# Patient Record
Sex: Male | Born: 1950 | Race: Asian | Hispanic: No | Marital: Married | Smoking: Former smoker
Health system: Southern US, Community
[De-identification: ages and names within clinical notes are randomized; demographics above are authoritative.]

## PROBLEM LIST (undated history)

## (undated) DIAGNOSIS — R7989 Other specified abnormal findings of blood chemistry: Secondary | ICD-10-CM

## (undated) DIAGNOSIS — R202 Paresthesia of skin: Secondary | ICD-10-CM

## (undated) DIAGNOSIS — E162 Hypoglycemia, unspecified: Secondary | ICD-10-CM

## (undated) DIAGNOSIS — R569 Unspecified convulsions: Secondary | ICD-10-CM

## (undated) DIAGNOSIS — I10 Essential (primary) hypertension: Secondary | ICD-10-CM

## (undated) DIAGNOSIS — M65949 Unspecified synovitis and tenosynovitis, unspecified hand: Secondary | ICD-10-CM

## (undated) DIAGNOSIS — E559 Vitamin D deficiency, unspecified: Secondary | ICD-10-CM

## (undated) DIAGNOSIS — M659 Synovitis and tenosynovitis, unspecified: Secondary | ICD-10-CM

## (undated) DIAGNOSIS — K5909 Other constipation: Secondary | ICD-10-CM

## (undated) DIAGNOSIS — H269 Unspecified cataract: Secondary | ICD-10-CM

## (undated) DIAGNOSIS — R413 Other amnesia: Secondary | ICD-10-CM

## (undated) DIAGNOSIS — E785 Hyperlipidemia, unspecified: Secondary | ICD-10-CM

## (undated) DIAGNOSIS — K219 Gastro-esophageal reflux disease without esophagitis: Secondary | ICD-10-CM

## (undated) DIAGNOSIS — E663 Overweight: Secondary | ICD-10-CM

## (undated) DIAGNOSIS — Z889 Allergy status to unspecified drugs, medicaments and biological substances status: Secondary | ICD-10-CM

## (undated) DIAGNOSIS — B356 Tinea cruris: Secondary | ICD-10-CM

## (undated) DIAGNOSIS — R945 Abnormal results of liver function studies: Secondary | ICD-10-CM

## (undated) DIAGNOSIS — T7840XA Allergy, unspecified, initial encounter: Secondary | ICD-10-CM

## (undated) DIAGNOSIS — G4700 Insomnia, unspecified: Secondary | ICD-10-CM

## (undated) DIAGNOSIS — F419 Anxiety disorder, unspecified: Secondary | ICD-10-CM

## (undated) DIAGNOSIS — K760 Fatty (change of) liver, not elsewhere classified: Secondary | ICD-10-CM

## (undated) HISTORY — DX: Unspecified convulsions: R56.9

## (undated) HISTORY — DX: Insomnia, unspecified: G47.00

## (undated) HISTORY — DX: Unspecified synovitis and tenosynovitis, unspecified hand: M65.949

## (undated) HISTORY — DX: Tinea cruris: B35.6

## (undated) HISTORY — DX: Allergy status to unspecified drugs, medicaments and biological substances: Z88.9

## (undated) HISTORY — DX: Other amnesia: R41.3

## (undated) HISTORY — DX: Allergy, unspecified, initial encounter: T78.40XA

## (undated) HISTORY — DX: Abnormal results of liver function studies: R94.5

## (undated) HISTORY — DX: Hyperlipidemia, unspecified: E78.5

## (undated) HISTORY — DX: Other constipation: K59.09

## (undated) HISTORY — DX: Gastro-esophageal reflux disease without esophagitis: K21.9

## (undated) HISTORY — DX: Fatty (change of) liver, not elsewhere classified: K76.0

## (undated) HISTORY — DX: Vitamin D deficiency, unspecified: E55.9

## (undated) HISTORY — DX: Other specified abnormal findings of blood chemistry: R79.89

## (undated) HISTORY — DX: Anxiety disorder, unspecified: F41.9

## (undated) HISTORY — DX: Hypoglycemia, unspecified: E16.2

## (undated) HISTORY — DX: Unspecified cataract: H26.9

## (undated) HISTORY — DX: Overweight: E66.3

## (undated) HISTORY — DX: Synovitis and tenosynovitis, unspecified: M65.9

## (undated) HISTORY — DX: Paresthesia of skin: R20.2

## (undated) HISTORY — PX: LASIK: SHX215

## (undated) HISTORY — DX: Essential (primary) hypertension: I10

---

## 2007-06-02 ENCOUNTER — Ambulatory Visit: Payer: Self-pay | Admitting: Gastroenterology

## 2011-06-02 ENCOUNTER — Inpatient Hospital Stay: Payer: Self-pay | Admitting: Internal Medicine

## 2011-06-02 DIAGNOSIS — I369 Nonrheumatic tricuspid valve disorder, unspecified: Secondary | ICD-10-CM

## 2011-06-18 LAB — HM COLONOSCOPY

## 2012-10-30 ENCOUNTER — Ambulatory Visit: Payer: Self-pay | Admitting: Internal Medicine

## 2012-10-30 LAB — PSA: PSA: 0.5

## 2014-05-04 ENCOUNTER — Ambulatory Visit: Payer: Self-pay | Admitting: Family Medicine

## 2014-09-03 ENCOUNTER — Observation Stay: Payer: Self-pay | Admitting: Internal Medicine

## 2014-10-16 NOTE — Discharge Summary (Signed)
PATIENT NAME:  David, Skinner MR#:  [id number] DATE OF BIRTH:  09-24-1950  DATE OF ADMISSION:  09/03/2014 DATE OF DISCHARGE:  09/05/2014  DISCHARGE DIAGNOSES:  1.  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 2.  Hypertension. 3.  Hyperlipidemia.  4.  The patient also has a history of depression.   DISCHARGE MEDICATIONS: Lexapro 20 mg p.o. daily, Singulair 10 mg p.o. in the evening, ranitidine 300 mg p.o. daily, losartan 50 mg p.o. daily, loratadine 10 mg p.o. b.i.d., Depakote 250 mg p.o. t.i.d. with meals.   CONSULTATIONS: Neurology consult with Mila Homer. Tamala Julian, MD.  PROCEDURES: CT of the head, MRI of the brain, EEG.   PERTINENT LABORATORY DATA: EKG showed normal sinus rhythm with 81 beats minutes. UA was clear without any infection.  Electrolytes: Sodium is 139, potassium 3.7, chloride 104, bicarbonate 25, BUN 14, creatinine 1. The patient's INR is 0.9. CBC: WBC 7.7, hemoglobin 15.8, hematocrit 50.7, platelets 228,000.   CT head showed generalized atrophy. No acute findings.   MRI of the brain showed normal MRI of the brain and also small superficial retained metallic foreign body present in the left orbit and the skull present on 2012 CT.   Prolactin level is 15.7. TSH 0.790. B12 is 490. The RBC folate is normal at 49. RPR is nonreactive.   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 The patient had some nausea preceding this event. According to the girlfriend, he has been having some memory issues recently. The patient admitted because of transient global amnesia. The patient was monitored neurologically, and neurology consult obtained with Dr. Jayme Cloud. His CAT scan of the head is unremarkable. He did not have further episodes of amnesia. He remained asymptomatic with complete neurologically intact system. The patient needed MRI of the brain and also an EEG, and his MRI of the  brain was unremarkable and his EEG was concerning for some intermittent lateralized slowing.  The patient seen by Dr. Tamala Julian. He recommended that the patient needs to be on Depakote, and the patient was given a loading dose of Depakote 1 gram, and he suggested Depakote at 250 mg t.i.d., follow up with [facility] in 6 weeks. The patient's son at the bedside.Spoke with him He understands the diagnosis and follow up and the neurologist recommendations. 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 The patient is given a prescription for Depakote 250 mg t.i.d. I called it into his [facility] on [address].Marland Kitchen    TIME SPENT ON DISCHARGE PREPARATION: More than 30 minutes.   DISCHARGE PHYSICAL EXAMINATION: VITAL SIGNS: Temperature is 97.8, heart rate 71, blood pressure 120/70.  NEUROLOGIC: Alert, awake, oriented. Cranial nerves II-XII intact. Power 5/5 upper and lower extremities. Sensory intact. DTR's 2+ bilaterally. CARDIOVASCULAR: S1, S2 regular.  LUNGS: Clear to auscultation. No wheeze, no rales.   We will make appointment with [facility] in 4-6 weeks.  TIME SPENT: More than 30 minutes.    ____________________________ Epifanio Lesches, MD sk:TM D: 09/05/2014 18:05:14 ET T: 09/06/2014 00:51:03 ET JOB#: [id number]  cc: Epifanio Lesches, MD, <Dictator> Mila Homer. Tamala Julian, MD Unknown, Blanchie Dessert MD ELECTRONICALLY SIGNED 09/08/2014 12:36

## 2014-10-16 NOTE — H&P (Signed)
PATIENT NAME:  David Skinner, David Skinner MR#:  [id number] DATE OF BIRTH:  25-Sep-1950  DATE OF ADMISSION:  09/03/2014  REFERRING PHYSICIAN:  Debbrah Alar, MD   PRIMARY CARE PHYSICIAN:  Bethena Roys. Ancil Boozer, MD  ADMITTING PHYSICIAN:  Juluis Mire, MD   CHIEF COMPLAINT:  Sudden onset of memory loss with confusion.    HISTORY OF PRESENT ILLNESS:  This is a 64 year old [place] male with a past medical history of hypertension, hyperlipidemia, TIA, and anxiety disorder, who presents with the complaints of sudden onset of memory loss with associated confusion. According to the patient's girlfriend and the patient, the patient watched a movie and went to bed late last night, and 10 minutes after going to bed, he woke up and could not remember anything. He was repeating the same words again and again, and he could not remember where his IPOD was, hence was brought to the Emergency Room for further evaluation.   The patient denies any focal weakness or numbness. No dizziness. No loss of consciousness. No chest pain. No shortness of breath. No recent fever or cough. No nausea, vomiting, diarrhea, or diaphoresis. No urinary symptoms. The patient mentioned experiencing similar symptoms in 2012, during which time he was admitted for one day in the hospital for observation and was told he had transient global amnesia.   In the Emergency Room, the patient was evaluated by the ED physician. All of his labs were essentially normal, and a CT of the head, noncontrast study, was also negative for any acute intracranial pathology. In view of his acute CNS symptoms, neurology on-call, Dr. Nonie Hoyer, was consulted by the ED physician over the phone, and as per the neurologist, Dr. Nonie Hoyer, the patient does not have a TIA or a stroke but possibly global amnesia. Hence, I advised admission for further observation at this time. Currently, the patient is comfortably resting in the bed at this time. Denies any complaints. He  is alert, awake, and oriented x 3. He is able to tell his name and also the place where he is and also able to identify his girlfriend and his son, who are with the patient at this time.    PAST MEDICAL HISTORY: 1.  Hypertension.  2.  Hyperlipidemia.  3.  TIA.  4.  Anxiety disorder.   PAST SURGICAL HISTORY:  Lasik eye surgery.   ALLERGIES:  LEVAQUIN.   FAMILY HISTORY:  No history of any hypertension, diabetes, stroke, or heart disease.   SOCIAL HISTORY:  He is single. He works as a Scientist, forensic. History of alcohol usage about 2 to 3 times per week and denies any smoking or substance abuse.    HOME MEDICATIONS:  Aspirin 325 mg 1 tablet once a day, Lipitor 20 mg 1 tablet orally once a day, metoprolol 25 mg 1 tablet orally once a day, escitalopram 20 mg tablet 1 tablet orally once a day.   REVIEW OF SYSTEMS: CONSTITUTIONAL:  Negative for fever, chills, fatigue, or generalized weakness.  EYES:  Negative for blurred vision or double vision. No pain. No drainage. No discharge.  EARS, NOSE, AND THROAT:  Negative for tinnitus, ear pain, hearing loss, epistaxis, nasal discharge, or difficulty swallowing.  RESPIRATORY:  Negative for cough, wheezing, dyspnea, hemoptysis, or painful respiration.  CARDIOVASCULAR:  Negative for chest pain, palpitations, dizziness, syncopal episode, orthopnea, dyspnea on exertion, or pedal edema.  GASTROINTESTINAL:  Negative for nausea, vomiting, diarrhea, abdominal pain, hematemesis, melena, or rectal bleeding.  GENITOURINARY:  Negative for dysuria,  frequency, urgency, or hematuria.  ENDOCRINE:  Negative for polyuria, nocturia, or heat or cold intolerance.  HEMATOLOGIC AND LYMPHATIC:  Negative for anemia, easy bruising or bleeding, or swollen glands.  INTEGUMENTARY:  Negative for acne, skin rash, or lesions.  MUSCULOSKELETAL:  Negative for neck or back pain. No history of arthritis or gout.  NEUROLOGICAL:  Positive for sudden onset of memory loss as mentioned in  the history of present illness. Negative for focal weakness or numbness. History of TIA in the past.  PSYCHIATRIC:  Positive for history of anxiety disorder, stable on medications.   PHYSICAL EXAMINATION: VITAL SIGNS:  Temperature 98.9 degrees Fahrenheit, pulse rate 73 per minute, respirations 18 per minute, blood pressure 153/94, current blood pressure 123/70, oxygen saturation 97% on room air.  GENERAL:  Well developed, well nourished, alert, in no acute distress, comfortably resting in the bed.  HEAD:  Atraumatic, normocephalic.  EYES:  Pupils are equal and reactive to light and accommodation. No conjunctival pallor. No icterus. Extraocular movements are intact.  NOSE:  No drainage. No lesions.  EARS:  No drainage.  ORAL CAVITY:  No mucosal lesions. No exudates.  NECK:  Supple. No JVD. No thyromegaly. No carotid bruit. Range of motion of the neck is within normal limits.  RESPIRATORY:  Good respiratory effort. Not using accessory muscles of respiration. Bilateral vesicular breath sounds and no rales or rhonchi.  CARDIOVASCULAR:  S1 and S2, regular. No murmurs, gallops, or clicks. Pulses are equal at carotid, femoral, and pedal pulses. No peripheral edema.  GASTROINTESTINAL:  Abdomen is soft and nontender. No hepatosplenomegaly. No masses. No rigidity. No guarding. Bowel sounds present and equal in all 4 quadrants.  GENITOURINARY:  Deferred.  MUSCULOSKELETAL:  No joint tenderness or effusion. Range of motion is adequate. Strength and tone are equal bilaterally.  SKIN:  Inspection within normal limits.  LYMPHATIC:  No cervical lymphadenopathy.  VASCULAR:  Good dorsalis pedis and posterior tibial pulses.  NEUROLOGIC:  Alert, awake, and oriented x 3. Cranial nerves II through XII are grossly intact. No sensory deficit. Motor strength is 5/5 in both upper and lower extremities. DTRs are 2+ bilateral and symmetrical. Plantars are downgoing.  PSYCHIATRIC:  Alert, awake, and oriented x 3. Judgment  and insight are adequate. Memory and mood are within normal limits at this time.   ANCILLARY DATA:   LABORATORY DATA:  Serum glucose is 125, BUN 14, creatinine 1.11, sodium 138, potassium 3.7, chloride 104, bicarbonate 25, plasma ammonia 34, total protein 7.9, albumin 4.6, total bilirubin 0.6, alkaline phosphatase 68, AST 34, and ALT is 44. Troponin is less than 0.03. WBC is 7.7, hemoglobin 16.8, hematocrit 50.1, and platelet count is 228,000. Prothrombin time is 12.3, INR 0.9. Urinalysis is unremarkable.   IMAGING STUDIES:  CT of the head, noncontrast study:  Moderate generalized atrophy. No acute intracranial findings.   ASSESSMENT AND PLAN:  This is a 64 year old [place] male with a history of hypertension, hyperlipidemia, and transient ischemic attack, who presents with sudden onset of memory loss with confusion. Workup in the Emergency Room revealed normal labs and CT of the head negative for any acute intracranial pathology. Neurology on-call was consulted by the ED physician and was opined as transient global amnesia and advised admission for further observation.   1.  Sudden onset of memory loss with confusion. Workup in the ED including a CT of the head, noncontrast study is negative, likely transient global amnesia. Plan:  Admit for observation. Neurological checks. Continue aspirin. Neurology consultation requested  for further advice.  2.  Hypertension, stable on home medications.  3.  Hyperlipidemia, stable on home medication and is stable.  4.  Deep vein thrombosis prophylaxis with subcutaneous Lovenox.  5.  Gastrointestinal prophylaxis with ranitidine.   CODE STATUS:  Full code.   TIME SPENT:  50 minutes.    ____________________________ Juluis Mire, MD enr:nb D: 09/03/2014 07:50:33 ET T: 09/03/2014 08:40:40 ET JOB#: [id number]  cc: Juluis Mire, MD, <Dictator> Bethena Roys. Ancil Boozer, MD Juluis Mire MD ELECTRONICALLY SIGNED 09/03/2014 20:17

## 2014-10-16 NOTE — Consult Note (Signed)
Referring Physician:  Azucena Freed :   Primary Care Physician:  Azucena Freed : [facility], [address] [place], [place] [address], [place] [phone]  Reason for Consult: Admit Date: 03-Sep-2014  Chief Complaint: memory loss  Reason for Consult: confusion   History of Present Illness: History of Present Illness:   see at request of Dr. Reece Levy for memory loss;  64 yo RHD M presents to [facility] after sudden loss of memory last night.  Pt reports feeling nauseasated before this episode.  He has been having some memory loss lately per girlfriend of unclear etiology.  He had a similar episode 3 years ago and was diagnosed with transient global amnesia.  Pt denies headaches after episode.  There are no obvious unusual movements, incontinence or tongue biting.  Pt denies chest pain, SOB, palpatations or vision changes before episodes.  Hx limited by language barrier but family members are on the phone as well.  ROS:  General denies complaints   HEENT no complaints   Lungs no complaints   Cardiac no complaints   GI no complaints   GU no complaints   Musculoskeletal no complaints   Extremities no complaints   Skin no complaints   Neuro memory loss   Endocrine no complaints   Psych no complaints   Past Medical/Surgical Hx:  TIA - Transient Ischemic Attack:   Anxiety:   Seasonal Allergies:   Hypertension:   Hypercholesterolemia:   eye surgery:   Denies surgical history.:   Past Medical/ Surgical Hx:  Past Medical History HTN, HLD, TIA, anxiety   Past Surgical History none   Home Medications: Medication Instructions Last Modified Date/Time  escitalopram 20 mg oral tablet 1 tab(s) orally once a day 19-Mar-16 08:06  montelukast 10 mg oral tablet 1 tab(s) orally once a day (in the evening) 19-Mar-16 08:06  ranitidine 300 mg oral tablet 1 tab(s) orally once a day (at bedtime) 19-Mar-16 08:06  losartan 50 mg oral tablet 1 tab(s) orally once a day 19-Mar-16  08:06  loratadine 10 mg oral tablet 1 tab(s) orally 2 times a day 19-Mar-16 08:06  simvastatin 20 mg oral tablet 1 tab(s) orally once a day (at bedtime) 19-Mar-16 08:06   Allergies:  Levaquin: Rash, Itching  Allergies:  Allergies NKDA   Social/Family History: Employment Status: currently employed; works in Company secretary  Lives With: significant other  Living Arrangements: apartment  Social History: no tob, no EtOH, no illicits, no head trauma  Family History: no seizures, no stroke   Vital Signs: **Vital Signs.:   19-Mar-16 15:41  Vital Signs Type Routine  Temperature Temperature (F) 98  Celsius 36.6  Temperature Source oral  Pulse Pulse 65  Respirations Respirations 20  Systolic BP Systolic BP 751  Diastolic BP (mmHg) Diastolic BP (mmHg) 66  Mean BP 77  Pulse Ox % Pulse Ox % 94  Pulse Ox Activity Level  At rest  Oxygen Delivery Room Air/ 21 %   Physical Exam: General: nl weight, NAD  HEENT: normocephalic, sclera nonicteric, oropharynx clear  Neck: supple, no JVD, no bruits  Chest: CTA B, no wheezing  Cardiac: RRR, no murmurs, no edema, 2+ pulses  Extremities: no C/C/E, FROM   Neurologic Exam: Mental Status: alert and oriented x 3, normal speech and language, follows complex commands but needs some instruction due to poor english  Cranial Nerves: PERRLA, EOMI, nl VF, face symmetric, tongue midline, shoulder shrug equal  Motor Exam: 5/5 B normal, tone, no tremor  Deep Tendon  Reflexes: 2+/4 B, plantars downgoing B, no Hoffman  Sensory Exam: pinprick, temperature, and vibration intact B  Coordination: FTN and HTS WNL, nl RAM   Lab Results: Thyroid:  19-Mar-16 12:35   Thyroid Stimulating Hormone 0.790 (0.350-4.500 NOTE: New Reference Range  08/09/14)  Hepatic:  19-Mar-16 02:37   Bilirubin, Total 0.6 (0.3-1.2 NOTE: New Reference Range  08/09/14)  Alkaline Phosphatase 68 (38-126 NOTE: New Reference Range  08/09/14)  SGPT (ALT) 44 (17-63 NOTE: New Reference  Range  08/09/14)  SGOT (AST) 34 (15-41 NOTE: New Reference Range  08/09/14)  Total Protein, Serum 7.9 (6.5-8.1 NOTE: New Reference Range  08/09/14)  Albumin, Serum 4.6 (3.5-5.0 NOTE: New reference range  08/09/14)  Routine Chem:  19-Mar-16 02:37   Glucose, Serum  125 (65-99 NOTE: New Reference Range  08/09/14)  BUN 14 (6-20 NOTE: New Reference Range  08/09/14)  Creatinine (comp) 1.11 (0.61-1.24 NOTE: New Reference Range  08/09/14)  Sodium, Serum 139 (135-145 NOTE: New Reference Range  08/09/14)  Potassium, Serum 3.7 (3.5-5.1 NOTE: New Reference Range  08/09/14)  Chloride, Serum 104 (101-111 NOTE: New Reference Range  08/09/14)  CO2, Serum 25 (22-32 NOTE: New Reference Range  08/09/14)  Calcium (Total), Serum 9.1 (8.9-10.3 NOTE: New Reference Range  08/09/14)  eGFR (African American) >60  eGFR (Non-African American) >60 (eGFR values <52m/min/1.73 m2 may be an indication of chronic kidney disease (CKD). Calculated eGFR is useful in patients with stable renal function. The eGFR calculation will not be reliable in acutely ill patients when serum creatinine is changing rapidly. It is not useful in patients on dialysis. The eGFR calculation may not be applicable to patients at the low and high extremes of body sizes, pregnant women, and vegetarians.)  Anion Gap 10    05:02   Ammonia, Plasma 34 (9-35 NOTE: New Reference Range  08/09/14)  Cardiac:  19-Mar-16 02:37   Troponin I <0.03 (0.00-0.03 0.03 ng/mL or less: NEGATIVE  Repeat testing in 3-6 hrs  if clinically indicated. >0.03 ng/mL: POTENTIAL  MYOCARDIAL INJURY. Repeat  testing in 3-6 hrs if  clinically indicated. NOTE: An increase or decrease  of 30% or more on serial  testing suggests a  clinically important change NOTE: New Reference Range  08/09/14)  Routine UA:  19-Mar-16 02:37   Color (UA) Straw  Clarity (UA) Clear  Glucose (UA) Negative  Bilirubin (UA) Negative  Ketones (UA) Negative   Specific Gravity (UA) 1.005  Blood (UA) Negative  pH (UA) 7.0  Protein (UA) Negative  Nitrite (UA) Negative  Leukocyte Esterase (UA) Negative (Result(s) reported on 03 Sep 2014 at 04:15AM.)  RBC (UA) <1 /HPF  WBC (UA) <1 /HPF  Bacteria (UA) NONE SEEN  Epithelial Cells (UA) NONE SEEN  Result(s) reported on 03 Sep 2014 at 04:15AM.  Routine Coag:  19-Mar-16 02:37   Prothrombin 12.3 (11.4-15.0 NOTE: New Reference Range  07/15/14)  INR 0.9 (INR reference interval applies to patients on anticoagulant therapy. A single INR therapeutic range for coumarins is not optimal for all indications; however, the suggested range for most indications is 2.0 - 3.0. Exceptions to the INR Reference Range may include: Prosthetic heart valves, acute myocardial infarction, prevention of myocardial infarction, and combinations of aspirin and anticoagulant. The need for a higher or lower target INR must be assessed individually. Reference: The Pharmacology and Management of the Vitamin K  antagonists: the seventh ACCP Conference on Antithrombotic and Thrombolytic Therapy. CZDGLO.7564Sept:126 (3suppl): [id number] A HCT value >55% may artifactually increase the PT.  In one study,  the increase was an average of 25%. Reference:  "Effect on Routine and Special Coagulation Testing Values of Citrate Anticoagulant Adjustment in Patients with High HCT Values." American Journal of Clinical Pathology 2006;126:400-405.)  Activated PTT (APTT) 27.5 (A HCT value >55% may artifactually increase the APTT. In one study, the increase was an average of 19%. Reference: "Effect on Routine and Special Coagulation Testing Values of Citrate Anticoagulant Adjustment in Patients with High HCT Values." American Journal of Clinical Pathology 2006;126:400-405.)  Routine Hem:  19-Mar-16 02:37   WBC (CBC) 7.7  RBC (CBC) 5.37  Hemoglobin (CBC) 16.8  Hematocrit (CBC) 50.1  Platelet Count (CBC) 228  MCV 93  MCH 31.3  MCHC 33.6   RDW 13.9  Neutrophil % 46.3  Lymphocyte % 36.3  Monocyte % 13.0  Eosinophil % 3.5  Basophil % 0.9  Neutrophil # 3.5  Lymphocyte # 2.8  Monocyte # 1.0  Eosinophil # 0.3  Basophil # 0.1 (Result(s) reported on 03 Sep 2014 at 02:50AM.)   Radiology Results: CT:    19-Mar-16 02:49, CT Head Without Contrast  CT Head Without Contrast   REASON FOR EXAM:    memory  COMMENTS:       PROCEDURE: CT  - CT HEAD WITHOUT CONTRAST  - Sep 03 2014  2:49AM     CLINICAL DATA:  Amnesia    EXAM:  CT HEAD WITHOUT CONTRAST    TECHNIQUE:  Contiguous axial images were obtained from the base of the skull  through the vertex without intravenous contrast.    COMPARISON:  06/02/2011  FINDINGS:  There is no intracranial hemorrhage, mass or evidence of acute  infarction. There is mild generalized atrophy. Gray/white  differentiation is normal throughout.Ventricles and basal cisterns  are enlarged due to the generalized atrophy but otherwise  unremarkable, with preservation of the midline. There is no  significant extra-axial fluid collection.    No acute intracranial findings are evident.     IMPRESSION:  Moderate generalized atrophy. This is slightly progressed from  06/02/2011. No acute intracranial findings.    Electronically Signed    By: Andreas Newport M.D.    On: 09/03/2014 04:02         Verified By: Andreas Newport, M.D.,   Radiology Impression: Radiology Impression: CT of head personally reviewed by me and normal   Impression/Recommendations: Recommendations:   prior notes reviewed by me reviewed by me   Possible seizure-  it is very rare for transient global amnesia to recur.  No focal deficits to suggest stroke or TIA;  due to length of memory loss and forgetfulness we believe seizure is most likely.  Can not r/o psychiatric disorder either. MRI of brain w/wo contrast on Monday with interpreter per MRI policy EEG Monday continue ASA and statin will follow  Electronic  Signatures: Jamison Neighbor (MD)  (Signed 19-Mar-16 17:33)  Authored: REFERRING PHYSICIAN, Primary Care Physician, Consult, History of Present Illness, Review of Systems, PAST MEDICAL/SURGICAL HISTORY, HOME MEDICATIONS, ALLERGIES, Social/Family History, NURSING VITAL SIGNS, Physical Exam-, LAB RESULTS, RADIOLOGY RESULTS, Recommendations   Last Updated: 19-Mar-16 17:33 by Jamison Neighbor (MD)

## 2014-11-16 DIAGNOSIS — G40209 Localization-related (focal) (partial) symptomatic epilepsy and epileptic syndromes with complex partial seizures, not intractable, without status epilepticus: Secondary | ICD-10-CM | POA: Insufficient documentation

## 2014-11-28 ENCOUNTER — Encounter: Payer: Self-pay | Admitting: Family Medicine

## 2014-11-28 ENCOUNTER — Ambulatory Visit (INDEPENDENT_AMBULATORY_CARE_PROVIDER_SITE_OTHER): Payer: BLUE CROSS/BLUE SHIELD | Admitting: Family Medicine

## 2014-11-28 VITALS — BP 128/82 | HR 90 | Temp 97.7°F | Resp 16 | Ht 66.0 in | Wt 167.1 lb

## 2014-11-28 DIAGNOSIS — G4700 Insomnia, unspecified: Secondary | ICD-10-CM | POA: Insufficient documentation

## 2014-11-28 DIAGNOSIS — I10 Essential (primary) hypertension: Secondary | ICD-10-CM | POA: Diagnosis not present

## 2014-11-28 DIAGNOSIS — K5900 Constipation, unspecified: Secondary | ICD-10-CM | POA: Diagnosis not present

## 2014-11-28 DIAGNOSIS — R413 Other amnesia: Secondary | ICD-10-CM | POA: Diagnosis not present

## 2014-11-28 DIAGNOSIS — K5909 Other constipation: Secondary | ICD-10-CM | POA: Insufficient documentation

## 2014-11-28 DIAGNOSIS — E785 Hyperlipidemia, unspecified: Secondary | ICD-10-CM | POA: Insufficient documentation

## 2014-11-28 DIAGNOSIS — E559 Vitamin D deficiency, unspecified: Secondary | ICD-10-CM

## 2014-11-28 DIAGNOSIS — J309 Allergic rhinitis, unspecified: Secondary | ICD-10-CM | POA: Diagnosis not present

## 2014-11-28 DIAGNOSIS — K760 Fatty (change of) liver, not elsewhere classified: Secondary | ICD-10-CM | POA: Insufficient documentation

## 2014-11-28 DIAGNOSIS — Z114 Encounter for screening for human immunodeficiency virus [HIV]: Secondary | ICD-10-CM

## 2014-11-28 DIAGNOSIS — G40209 Localization-related (focal) (partial) symptomatic epilepsy and epileptic syndromes with complex partial seizures, not intractable, without status epilepticus: Secondary | ICD-10-CM | POA: Diagnosis not present

## 2014-11-28 DIAGNOSIS — K219 Gastro-esophageal reflux disease without esophagitis: Secondary | ICD-10-CM

## 2014-11-28 DIAGNOSIS — F418 Other specified anxiety disorders: Secondary | ICD-10-CM | POA: Insufficient documentation

## 2014-11-28 DIAGNOSIS — J3089 Other allergic rhinitis: Secondary | ICD-10-CM

## 2014-11-28 DIAGNOSIS — Z23 Encounter for immunization: Secondary | ICD-10-CM

## 2014-11-28 DIAGNOSIS — Z79899 Other long term (current) drug therapy: Secondary | ICD-10-CM

## 2014-11-28 DIAGNOSIS — M659 Synovitis and tenosynovitis, unspecified: Secondary | ICD-10-CM | POA: Insufficient documentation

## 2014-11-28 DIAGNOSIS — J302 Other seasonal allergic rhinitis: Secondary | ICD-10-CM

## 2014-11-28 MED ORDER — MONTELUKAST SODIUM 10 MG PO TABS: 10.0000 mg | ORAL_TABLET | Freq: Every day | ORAL | Status: DC

## 2014-11-28 MED ORDER — RANITIDINE HCL 300 MG PO TABS: 300.0000 mg | ORAL_TABLET | Freq: Every day | ORAL | Status: DC

## 2014-11-28 MED ORDER — POLYETHYLENE GLYCOL 3350 17 GM/SCOOP PO POWD: 17.0000 g | Freq: Every day | ORAL | Status: DC

## 2014-11-28 MED ORDER — LOSARTAN POTASSIUM 50 MG PO TABS
50.0000 mg | ORAL_TABLET | Freq: Every day | ORAL | Status: DC
Start: 2014-11-28 — End: 2015-06-15

## 2014-11-28 MED ORDER — ATORVASTATIN CALCIUM 40 MG PO TABS: 40.0000 mg | ORAL_TABLET | Freq: Every day | ORAL | Status: DC

## 2014-11-28 MED ORDER — LORATADINE 10 MG PO TABS: 10.0000 mg | ORAL_TABLET | Freq: Two times a day (BID) | ORAL | Status: DC

## 2014-11-28 MED ORDER — FLUTICASONE PROPIONATE 50 MCG/ACT NA SUSP: 2.0000 | Freq: Every evening | NASAL | Status: DC | PRN

## 2014-11-28 MED ORDER — ESCITALOPRAM OXALATE 20 MG PO TABS: 20.0000 mg | ORAL_TABLET | Freq: Every day | ORAL | Status: DC

## 2014-11-28 NOTE — Progress Notes (Signed)
Name: David Skinner   MRN: [id number]    DOB: 23-Jun-1950   Date:11/28/2014       Progress Note  Subjective  Chief Complaint  Chief Complaint  Patient presents with  . Medication Refill    2 month F/U-Patient is leaving for [place] for 4 weeks and leaves 12/08/14 while over there gets stomach pain  . Hypertension    Headaches  . Hyperlipidemia    Nausea  . Gastrophageal Reflux    HPI  HTN: Patient taking medication as directed, bp has been at goal, he denies chest pain , occasionally has headaches. States occasionally gets SOB when active.  Seizures: seeing Dr. Manuella Ghazi, Depakote was discontinued - supposed to wean self off but he stopped abruptly, and started Keppra. Tremors have not been noticed, feeling less tired with new medication.   Hyperlipidemia: taking simvastatin, but we will change to ATorvastatin since it has less drug to drug interaction. He was initially given a history of TIA but now suspected it was a seizure.  He has risk factors and needs to stay on a statin.   GERD: doing well at this time, taking Ranitidine.  However when he goes back to [place] he eats a lot of spicy meals and symptoms gets worse.    Patient Active Problem List   Diagnosis Date Noted  . Depression with anxiety 11/28/2014  . Chronic constipation 11/28/2014  . Insomnia 11/28/2014  . Dyslipidemia 11/28/2014  . Fatty infiltration of liver 11/28/2014  . Essential (primary) hypertension 11/28/2014  . Gastric reflux 11/28/2014  . H/O transient cerebral ischemia 11/28/2014  . Memory loss or impairment 11/28/2014  . Perennial allergic rhinitis with seasonal variation 11/28/2014  . Vitamin D deficiency 11/28/2014  . Tenosynovitis of thumb 11/28/2014  . Localization-related focal epilepsy with complex partial seizures 11/16/2014    Past Surgical History  Procedure Laterality Date  . Lasik Bilateral     Family History  Problem Relation Age of Onset  . Family history unknown: Yes    History    Social History  . Marital Status: Single    Spouse Name: N/A  . Number of Children: N/A  . Years of Education: N/A   Occupational History  . Not on file.   Social History Main Topics  . Smoking status: Former Smoker -- 1.00 packs/day for 15 years    Types: Cigarettes    Start date: 06/17/1984    Quit date: 11/28/1999  . Smokeless tobacco: Not on file  . Alcohol Use: No  . Drug Use: No  . Sexual Activity:    Partners: Female   Other Topics Concern  . Not on file   Social History Narrative     Current outpatient prescriptions:  .  escitalopram (LEXAPRO) 20 MG tablet, Take 1 tablet (20 mg total) by mouth daily., Disp: 90 tablet, Rfl: 1 .  fluticasone (FLONASE) 50 MCG/ACT nasal spray, Place 2 sprays into both nostrils at bedtime as needed., Disp: 48 g, Rfl: 1 .  ketoconazole (NIZORAL) 2 % cream, Apply 1 application topically 2 (two) times daily., Disp: , Rfl: 0 .  levETIRAcetam (KEPPRA) 500 MG tablet, Take 500 mg by mouth 2 (two) times daily., Disp: , Rfl: 0 .  loratadine (CLARITIN) 10 MG tablet, Take 1 tablet (10 mg total) by mouth 2 (two) times daily., Disp: 180 tablet, Rfl: 1 .  losartan (COZAAR) 50 MG tablet, Take 1 tablet (50 mg total) by mouth daily., Disp: 90 tablet, Rfl: 1 .  montelukast (SINGULAIR)  10 MG tablet, Take 1 tablet (10 mg total) by mouth daily., Disp: 90 tablet, Rfl: 1 .  polyethylene glycol powder (GLYCOLAX/MIRALAX) powder, Take 17 g by mouth daily., Disp: 850 g, Rfl: 5 .  ranitidine (ZANTAC) 300 MG tablet, Take 1 tablet (300 mg total) by mouth daily., Disp: 90 tablet, Rfl: 1 .  atorvastatin (LIPITOR) 40 MG tablet, Take 1 tablet (40 mg total) by mouth daily., Disp: 90 tablet, Rfl: 1  Allergies  Allergen Reactions  . Ibuprofen Itching  . Levofloxacin   . Lisinopril Cough  . Metoprolol   . Tramadol Hcl      ROS Constitutional: Negative for fever or weight change.  Respiratory: Negative for cough but occasionally has shortness of breath.    Cardiovascular: Negative for chest pain or palpitations.  Gastrointestinal: Negative for abdominal pain, no bowel changes.  Musculoskeletal: Negative for gait problem or joint swelling.  Skin: Negative for rash.  Neurological: occasional headaches and seizures No other specific complaints in a complete review of systems (except as listed in HPI above).  Objective  Filed Vitals:   11/28/14 1503  BP: 128/82  Pulse: 90  Temp: 97.7 F (36.5 C)  TempSrc: Oral  Resp: 16  Height: 5\' 6"  (1.676 m)  Weight: 167 lb 1.6 oz (75.796 kg)  SpO2: 95%    Body mass index is 26.98 kg/(m^2).  Physical Exam  Constitutional: Patient appears well-developed and well-nourished. No distress.  HENT: Head: Normocephalic and atraumatic. Ears: B TMs ok, no erythema or effusion; Nose: Nose normal. Mouth/Throat: Oropharynx is clear and moist. No oropharyngeal exudate.  Eyes: Conjunctivae and EOM are normal. Pupils are equal, round, and reactive to light. No scleral icterus.  Neck: Normal range of motion. Neck supple. No JVD present. No thyromegaly present.  Cardiovascular: Normal rate, regular rhythm and normal heart sounds.  No murmur heard. No BLE edema. Pulmonary/Chest: Effort normal and breath sounds normal. No respiratory distress. Abdominal: Soft. Bowel sounds are normal, no distension. There is no tenderness. no masses Musculoskeletal: Normal range of motion, no joint effusions. No gross deformities Neurological: he is alert and oriented to person, place, and time. No cranial nerve deficit. Coordination, balance, strength, speech and gait are normal.  Skin: Skin is warm and dry. No rash noted. No erythema.  Psychiatric: Patient has a normal mood and affect. behavior is normal. Judgment and thought content normal.    PHQ2/9: Depression screen PHQ 2/9 11/28/2014  Decreased Interest 0  Down, Depressed, Hopeless 0  PHQ - 2 Score 0     Fall Risk: Fall Risk  11/28/2014  Falls in the past year? No      Assessment & Plan  1. Dyslipidemia Recheck labs and change from Simvastatin to Atorvastatin, ? History of TIA, also has hypertension and hyperlipidemia, less drug to drug interection - atorvastatin (LIPITOR) 40 MG tablet; Take 1 tablet (40 mg total) by mouth daily.  Dispense: 90 tablet; Refill: 1 - Lipid Profile  2. Essential (primary) hypertension At goal, continue medication - losartan (COZAAR) 50 MG tablet; Take 1 tablet (50 mg total) by mouth daily.  Dispense: 90 tablet; Refill: 1 - Comprehensive Metabolic Panel (CMET) - CBC with Differential  3. Gastric reflux Doing well at this time - ranitidine (ZANTAC) 300 MG tablet; Take 1 tablet (300 mg total) by mouth daily.  Dispense: 90 tablet; Refill: 1  4. Memory loss or impairment Dr. Manuella Ghazi thinks secondary to seizures, and he will have another EEG for further evaluation  5. Vitamin D  deficiency Recheck labs 6. Chronic constipation  - polyethylene glycol powder (GLYCOLAX/MIRALAX) powder; Take 17 g by mouth daily.  Dispense: 850 g; Refill: 5  7. Encounter for screening for HIV  - HIV antibody (with reflex)  8. Long-term use of high-risk medication  - Comprehensive Metabolic Panel (CMET) - CBC with Differential  9. Depression with anxiety  - escitalopram (LEXAPRO) 20 MG tablet; Take 1 tablet (20 mg total) by mouth daily.  Dispense: 90 tablet; Refill: 1  10. Localization-related focal epilepsy with complex partial seizures Continue follow up with Dr. Manuella Ghazi  11. Perennial allergic rhinitis with seasonal variation  - montelukast (SINGULAIR) 10 MG tablet; Take 1 tablet (10 mg total) by mouth daily.  Dispense: 90 tablet; Refill: 1 - loratadine (CLARITIN) 10 MG tablet; Take 1 tablet (10 mg total) by mouth 2 (two) times daily.  Dispense: 180 tablet; Refill: 1 - fluticasone (FLONASE) 50 MCG/ACT nasal spray; Place 2 sprays into both nostrils at bedtime as needed.  Dispense: 48 g; Refill: 1  12. Need for hepatitis B  vaccination  Hepatitis B vaccine

## 2014-11-28 NOTE — Patient Instructions (Addendum)
Check with insurance if they pay for shingles vaccine.    B?nh Tro Ng??c D? Dy Th?c Qu?n, Ng??i L?n (Gastroesophageal Reflux Diseaes, Adult) B?nh tro ng??c d? dy th?c qu?n (GERD) x?y ra khi axit t? d? dy tro ln th?c qu?n. Khi axit ti?p xc v?i th?c qu?n, axit gy ra ?au (vim) trong th?c qu?n. Theo th?i gian, GERD c th? t?o ra cc l? nh? (cc v?t lot) ? nim m?c th?c qu?n.  NGUYN NHN  Tr?ng l??ng c? th? t?ng. ?i?u ny t?o p l?c ln d? dy, lm t?ng axit t? d? dy vo th?c qu?n.  Ht thu?c l. Ht thu?c l lm t?ng s?n sinh axit trong d? dy.  U?ng r??u. ?y l nguyn nhn lm gi?m p l?c trong c? th?t th?c qu?n d??i (van ho?c vng c? gi?a th?c qu?n v d? dy), cho php axit t? d? dy vo th?c qu?n.  ?n t?i mu?n v b?ng no. Tnh tr?ng ny lm t?ng p l?c c?ng nh? t?ng s?n sinh axit trong d? dy.  D? t?t c? th?t th?c qu?n d??i. ?i khi khng tm th?y nguyn nhn. TRI?U CH?NG  ?au rt ? ph?n d??i gi?a ng?c pha sau x??ng ?c v ? khu v?c gi?a d? dy. Hi?n t??ng ny c th? x?y ra hai l?n m?t tu?n ho?c th??ng xuyn h?n.  Kh nu?t.  ?au h?ng.  Ho khan.  Cc tri?u ch?ng gi?ng hen suy?n, bao g?m t?c ng?c,kh th? ho?c th? kh kh. CH?N ?ON Chuyn gia ch?m [place] s?c kh?e c th? ch?n ?on GERD d?a trn cc tri?u ch?ng c?a b?n. Trong m?t s? tr??ng h?p, ch?p X quang v cc xt nghi?m khc c th? ???c ti?n hnh ?? ki?m tra cc bi?n ch?ng ho?c tnh tr?ng c?a d? dy v th?c qu?n. ?I?U TR? Chuyn gia ch?m [place] s?c kh?e c th? khuy?n ngh? dng thu?c khng c?n k ??n ho?c thu?c c?n k ??n ?? gip gi?m s?n sinh axit. Hy h?i chuyn gia ch?m [place] s?c kh?e c?a b?n tr??c khi b?t ??u ho?c dng thm b?t k? lo?i thu?c m?i no. H??NG D?N CH?M [place] T?I NH  Thay ??i cc y?u t? m b?n c th? ki?m sot ???c. H?i chuyn gia ch?m [place] s?c kh?e ?? ???c h??ng d?n v? vi?c gi?m cn, b? thu?c l v s? d?ng r??u.  Hessie Diener cc lo?i th?c ph?m v ?? u?ng lm cho cc tri?u ch?ng t?i t? h?n, ch?ng h?n nh?:  ?? u?ng  c caffeine ho?c r??u.  S c la.  B?c h ho?c v? b?c h.  T?i v hnh ty.  Th?c ?n [place].  Tri cy h? cam, ch?ng h?n nh? cam, chanh hay chanh ty.  Cc th?c ?n c c chua, ch?ng h?n nh? n??c x?t, ?t, salsa (n??c x?t [place]) v bnh pizza.  Cc lo?i th?c ?n chin xo v nhi?u ch?t bo.  Trnh n?m xu?ng ng? 3 ti?ng tr??c gi? ?i ng? ho?c tr??c khi c m?t gi?c ng? ng?n.  ?n nh?ng b?a ?n nh?, th??ng xuyn h?n thay v cc b?a ?n l?n.  M?c qu?n o r?ng. Khng ?eo b?t c? th? g ch?t quanh th?t l?ng gy p l?c ln d? dy.  Nng ??u gi??ng cao ln t? 6 ??n 8 inch b?ng cc kh?i g? ?? gip b?n ng?. S? d?ng thm g?i s? khng c tc d?ng.  Ch? s? d?ng thu?c khng c?n k ??n ho?c thu?c c?n k ??n ?? gi?m ?au, gi?m c?m gic kh ch?u ho?c h? s?t  theo ch? d?n c?a chuyn gia ch?m [place] s?c kh?e c?a b?n.  Khng dng thu?c atpirin, ibuprofen ho?c cc thu?c ch?ng vim khng c steroid (NSAID) khc. HY NGAY L?P T?C ?I KHM N?U:  B?n b? ?au ? cnh tay, c?, hm, r?ng ho?c l?ng.  Hi?n t??ng ?au t?ng ln ho?c thay ??i theo c??ng ?? ho?c th?i gian.  B?n b? bu?n nn, nn ho?c ?? m? hi (tot m? hi).  B?n b? kh th? ho?c ng?t x?u.  Ch?t nn c mu xanh l cy, vng, ?en ho?c trng gi?ng nh? b c ph ho?c mu.  Phn c mu ??, ?? nh? mu ho?c ?en. Nh?ng tri?u ch?ng ny c th? l d?u hi?u c?a cc v?n ?? khc, ch?ng h?n nh? b?nh tim, ch?y mu d? dy ho?c ch?y mu th?c qu?n. ??M B?O B?N:  Hi?u cc h??ng d?n ny.  S? theo di tnh tr?ng c?a mnh.  S? yu c?u tr? gip ngay l?p t?c n?u b?n c?m th?y khng ?? ho?c tnh tr?ng tr?m tr?ng h?n. Document Released: 03/13/2005 Document Revised: 02/03/2013 [facility] Patient Information 2015 [place]. This information is not intended to replace advice given to you by your health care provider. Make sure you discuss any questions you have with your health care provider.

## 2014-11-30 ENCOUNTER — Telehealth: Payer: Self-pay

## 2014-11-30 LAB — COMPREHENSIVE METABOLIC PANEL
ALT: 45 IU/L — ABNORMAL HIGH (ref 0–44)
AST: 21 IU/L (ref 0–40)
Albumin/Globulin Ratio: 1.5 (ref 1.1–2.5)
Albumin: 4.6 g/dL (ref 3.6–4.8)
Alkaline Phosphatase: 54 IU/L (ref 39–117)
BUN/Creatinine Ratio: 16 (ref 10–22)
BUN: 16 mg/dL (ref 8–27)
Bilirubin Total: 0.7 mg/dL (ref 0.0–1.2)
CALCIUM: 9.4 mg/dL (ref 8.6–10.2)
CHLORIDE: 104 mmol/L (ref 97–108)
CO2: 21 mmol/L (ref 18–29)
Creatinine, Ser: 0.99 mg/dL (ref 0.76–1.27)
GFR calc Af Amer: 95 mL/min/{1.73_m2} (ref >59)
GFR calc non Af Amer: 82 mL/min/{1.73_m2} (ref >59)
GLOBULIN, TOTAL: 3 g/dL (ref 1.5–4.5)
Glucose: 106 mg/dL — ABNORMAL HIGH (ref 65–99)
Potassium: 4.5 mmol/L (ref 3.5–5.2)
Sodium: 140 mmol/L (ref 134–144)
Total Protein: 7.6 g/dL (ref 6.0–8.5)

## 2014-11-30 LAB — CBC WITH DIFFERENTIAL/PLATELET
BASOS ABS: 0 10*3/uL (ref 0.0–0.2)
BASOS: 1 %
EOS (ABSOLUTE): 0.2 10*3/uL (ref 0.0–0.4)
Eos: 4 %
HEMATOCRIT: 48.4 % (ref 37.5–51.0)
HEMOGLOBIN: 16.2 g/dL (ref 12.6–17.7)
Immature Grans (Abs): 0 10*3/uL (ref 0.0–0.1)
Immature Granulocytes: 0 %
LYMPHS: 38 %
Lymphocytes Absolute: 2.3 10*3/uL (ref 0.7–3.1)
MCH: 31.2 pg (ref 26.6–33.0)
MCHC: 33.5 g/dL (ref 31.5–35.7)
MCV: 93 fL (ref 79–97)
MONOS ABS: 0.6 10*3/uL (ref 0.1–0.9)
Monocytes: 10 %
NEUTROS ABS: 2.8 10*3/uL (ref 1.4–7.0)
NEUTROS PCT: 47 %
Platelets: 239 10*3/uL (ref 150–379)
RBC: 5.19 x10E6/uL (ref 4.14–5.80)
RDW: 14 % (ref 12.3–15.4)
WBC: 6 10*3/uL (ref 3.4–10.8)

## 2014-11-30 LAB — LIPID PANEL
CHOLESTEROL TOTAL: 254 mg/dL — AB (ref 100–199)
Chol/HDL Ratio: 5.4 ratio units — ABNORMAL HIGH (ref 0.0–5.0)
HDL: 47 mg/dL (ref >39)
LDL CALC: 163 mg/dL — AB (ref 0–99)
Triglycerides: 219 mg/dL — ABNORMAL HIGH (ref 0–149)
VLDL Cholesterol Cal: 44 mg/dL — ABNORMAL HIGH (ref 5–40)

## 2014-11-30 LAB — HIV ANTIBODY (ROUTINE TESTING W REFLEX): HIV Screen 4th Generation wRfx: NONREACTIVE

## 2014-11-30 NOTE — Telephone Encounter (Signed)
Left vm for patient niece to return my call for lab results since patient needs translator.

## 2014-11-30 NOTE — Telephone Encounter (Signed)
-----   Message from [name], MD sent at 11/30/2014 10:15 AM EDT ----- His lipid panel is not at goal,  He was on simvastatin and I switched him to Atorvastatin on his last visit, his cardiovascular risk is high and needs to take it daily HIV neg Normal WBC and no anemia Sugar and kidney function within normal limits One of the liver enzymes is just one point above normal and not of concern. We will monitor Please notify patient, thank you

## 2014-12-01 NOTE — Telephone Encounter (Signed)
-----   Message from [name], MD sent at 11/30/2014 10:15 AM EDT ----- His lipid panel is not at goal,  He was on simvastatin and I switched him to Atorvastatin on his last visit, his cardiovascular risk is high and needs to take it daily HIV neg Normal WBC and no anemia Sugar and kidney function within normal limits One of the liver enzymes is just one point above normal and not of concern. We will monitor Please notify patient, thank you

## 2014-12-01 NOTE — Telephone Encounter (Signed)
Spoke with Gae Bon his niece about his labs and she stated she would tell Nazario Russom about his labs and taking his cholesterol medicine every day.

## 2015-03-03 ENCOUNTER — Ambulatory Visit: Payer: BLUE CROSS/BLUE SHIELD | Admitting: Family Medicine

## 2015-03-08 ENCOUNTER — Encounter: Payer: Self-pay | Admitting: Family Medicine

## 2015-03-08 ENCOUNTER — Ambulatory Visit (INDEPENDENT_AMBULATORY_CARE_PROVIDER_SITE_OTHER): Payer: BLUE CROSS/BLUE SHIELD | Admitting: Family Medicine

## 2015-03-08 VITALS — BP 120/86 | HR 78 | Temp 97.9°F | Resp 16 | Ht 66.0 in | Wt 166.0 lb

## 2015-03-08 DIAGNOSIS — E785 Hyperlipidemia, unspecified: Secondary | ICD-10-CM

## 2015-03-08 DIAGNOSIS — R413 Other amnesia: Secondary | ICD-10-CM | POA: Diagnosis not present

## 2015-03-08 DIAGNOSIS — Z23 Encounter for immunization: Secondary | ICD-10-CM

## 2015-03-08 DIAGNOSIS — F418 Other specified anxiety disorders: Secondary | ICD-10-CM | POA: Diagnosis not present

## 2015-03-08 DIAGNOSIS — K219 Gastro-esophageal reflux disease without esophagitis: Secondary | ICD-10-CM | POA: Diagnosis not present

## 2015-03-08 DIAGNOSIS — R5383 Other fatigue: Secondary | ICD-10-CM

## 2015-03-08 DIAGNOSIS — I10 Essential (primary) hypertension: Secondary | ICD-10-CM | POA: Diagnosis not present

## 2015-03-08 DIAGNOSIS — E559 Vitamin D deficiency, unspecified: Secondary | ICD-10-CM

## 2015-03-08 DIAGNOSIS — R739 Hyperglycemia, unspecified: Secondary | ICD-10-CM

## 2015-03-08 NOTE — Progress Notes (Signed)
Name: David Skinner   MRN: [id number]    DOB: 07-Dec-1950   Date:03/08/2015       Progress Note  Subjective  Chief Complaint  Chief Complaint  Patient presents with  . Medication Refill    3 month F/U  . Gastrophageal Reflux    Well Controlled with Medication  . Hypertension    Light headaches  . Hyperlipidemia  . Depression    Better than it was before but still very tired  . Memory Loss    Still forgetting alot, where he is putting things.    HPI  HTN: he has been taking medication daily, no chest pain or palpitation.   GERD: well controlled with medication  Hyperlipidemia: last labs were not at goal, but he is now on Lipitor and we will recheck levels, no side effects  Depression: he is taking Lexapro, denies side effects, he does not feel sad, but feels tired.   Memory loss: seeing neurologist for seizure disorder, advised to discuss with neurologist. Forgetting where he places his keys, forgetting names of co-workers. Loved ones have not noticed his lapse in memory.   Seizure Disorder: initially thought to be a TIA but was diagnosed with seizures by neurologist . He is taking half dose of Keppra twice daily, and is symptoms free. He has daily headaches as a side effects. Recently seen by Dr. Manuella Ghazi  Patient Active Problem List   Diagnosis Date Noted  . Depression with anxiety 11/28/2014  . Chronic constipation 11/28/2014  . Insomnia 11/28/2014  . Dyslipidemia 11/28/2014  . Fatty infiltration of liver 11/28/2014  . Essential (primary) hypertension 11/28/2014  . Gastric reflux 11/28/2014  . Memory loss or impairment 11/28/2014  . Perennial allergic rhinitis with seasonal variation 11/28/2014  . Vitamin D deficiency 11/28/2014  . Tenosynovitis of thumb 11/28/2014  . Localization-related focal epilepsy with complex partial seizures 11/16/2014    Past Surgical History  Procedure Laterality Date  . Lasik Bilateral     Family History  Problem Relation Age of Onset   . Family history unknown: Yes    Social History   Social History  . Marital Status: Single    Spouse Name: N/A  . Number of Children: N/A  . Years of Education: N/A   Occupational History  . Not on file.   Social History Main Topics  . Smoking status: Former Smoker -- 1.00 packs/day for 15 years    Types: Cigarettes    Start date: 06/17/1984    Quit date: 11/28/1999  . Smokeless tobacco: Not on file  . Alcohol Use: No  . Drug Use: No  . Sexual Activity:    Partners: Female   Other Topics Concern  . Not on file   Social History Narrative     Current outpatient prescriptions:  .  atorvastatin (LIPITOR) 40 MG tablet, Take 1 tablet (40 mg total) by mouth daily., Disp: 90 tablet, Rfl: 1 .  escitalopram (LEXAPRO) 20 MG tablet, Take 1 tablet (20 mg total) by mouth daily., Disp: 90 tablet, Rfl: 1 .  fluticasone (FLONASE) 50 MCG/ACT nasal spray, Place 2 sprays into both nostrils at bedtime as needed., Disp: 48 g, Rfl: 1 .  ketoconazole (NIZORAL) 2 % cream, Apply 1 application topically 2 (two) times daily., Disp: , Rfl: 0 .  levETIRAcetam (KEPPRA) 500 MG tablet, Take 250 mg by mouth 2 (two) times daily., Disp: , Rfl: 0 .  loratadine (CLARITIN) 10 MG tablet, Take 1 tablet (10 mg total) by mouth  2 (two) times daily., Disp: 180 tablet, Rfl: 1 .  losartan (COZAAR) 50 MG tablet, Take 1 tablet (50 mg total) by mouth daily., Disp: 90 tablet, Rfl: 1 .  montelukast (SINGULAIR) 10 MG tablet, Take 1 tablet (10 mg total) by mouth daily., Disp: 90 tablet, Rfl: 1 .  polyethylene glycol powder (GLYCOLAX/MIRALAX) powder, Take 17 g by mouth daily., Disp: 850 g, Rfl: 5 .  ranitidine (ZANTAC) 300 MG tablet, Take 1 tablet (300 mg total) by mouth daily., Disp: 90 tablet, Rfl: 1  Allergies  Allergen Reactions  . Ibuprofen Itching  . Levofloxacin   . Lisinopril Cough  . Metoprolol   . Nsaids Itching  . Tramadol Hcl      ROS  Constitutional: Negative for fever or weight change.   Respiratory: Negative for cough and shortness of breath.   Cardiovascular: Negative for chest pain or palpitations.  Gastrointestinal: Negative for abdominal pain, no bowel changes.  Musculoskeletal: Negative for gait problem or joint swelling.  Skin: Negative for rash.  Neurological: Negative for dizziness , he has  headache.  No other specific complaints in a complete review of systems (except as listed in HPI above).  Objective  Filed Vitals:   03/08/15 1125  BP: 120/86  Pulse: 78  Temp: 97.9 F (36.6 C)  TempSrc: Oral  Resp: 16  Height: 5\' 6"  (1.676 m)  Weight: 166 lb (75.297 kg)  SpO2: 93%    Body mass index is 26.81 kg/(m^2).  Physical Exam   Constitutional: Patient appears well-developed and well-nourished.  No distress.  HEENT: head atraumatic, normocephalic, pupils equal and reactive to light,  neck supple, throat within normal limits Cardiovascular: Normal rate, regular rhythm and normal heart sounds.  No murmur heard. No BLE edema. Pulmonary/Chest: Effort normal and breath sounds normal. No respiratory distress. Abdominal: Soft.  There is no tenderness. Psychiatric: Patient has a normal mood and affect. behavior is normal. Judgment and thought content normal.  PHQ2/9: Depression screen [facility] 2/9 03/08/2015 11/28/2014  Decreased Interest 0 0  Down, Depressed, Hopeless 0 0  PHQ - 2 Score 0 0     Fall Risk: Fall Risk  03/08/2015 11/28/2014  Falls in the past year? No No     Functional Status Survey: Is the patient deaf or have difficulty hearing?: No Does the patient have difficulty seeing, even when wearing glasses/contacts?: No Does the patient have difficulty concentrating, remembering, or making decisions?: Yes Does the patient have difficulty walking or climbing stairs?: No Does the patient have difficulty dressing or bathing?: No Does the patient have difficulty doing errands alone such as visiting a doctor's office or shopping?: No    Assessment &  Plan  1. Essential (primary) hypertension  Doing well, recheck labs - Comprehensive metabolic panel  2. Needs flu shot  - Flu Vaccine QUAD 36+ mos PF IM (Fluarix & Fluzone Quad PF)  3. Gastric reflux   well controlled  4. Depression with anxiety  Continue Lexapro, avoid Wellbutrin because of history of seizures  5. Dyslipidemia  - Lipid panel  6. Memory loss or impairment  Seeing Neurologist, advised to discuss it with him  7. Vitamin D deficiency  - Vit D  25 hydroxy (rtn osteoporosis monitoring)  8. Need for shingles vaccine  -Zostavax vaccine   9. Hyperglycemia  - Hemoglobin A1c  10. Other fatigue  - TSH

## 2015-03-09 ENCOUNTER — Encounter: Payer: Self-pay | Admitting: Family Medicine

## 2015-03-09 DIAGNOSIS — N521 Erectile dysfunction due to diseases classified elsewhere: Secondary | ICD-10-CM | POA: Insufficient documentation

## 2015-03-09 DIAGNOSIS — E1140 Type 2 diabetes mellitus with diabetic neuropathy, unspecified: Secondary | ICD-10-CM | POA: Insufficient documentation

## 2015-03-09 LAB — LIPID PANEL
Chol/HDL Ratio: 3.6 ratio units (ref 0.0–5.0)
Cholesterol, Total: 183 mg/dL (ref 100–199)
HDL: 51 mg/dL (ref >39)
LDL Calculated: 100 mg/dL — ABNORMAL HIGH (ref 0–99)
Triglycerides: 160 mg/dL — ABNORMAL HIGH (ref 0–149)
VLDL CHOLESTEROL CAL: 32 mg/dL (ref 5–40)

## 2015-03-09 LAB — HEMOGLOBIN A1C
Est. average glucose Bld gHb Est-mCnc: 140 mg/dL
Hgb A1c MFr Bld: 6.5 % — ABNORMAL HIGH (ref 4.8–5.6)

## 2015-03-09 LAB — COMPREHENSIVE METABOLIC PANEL
A/G RATIO: 1.6 (ref 1.1–2.5)
ALT: 67 IU/L — AB (ref 0–44)
AST: 35 IU/L (ref 0–40)
Albumin: 5 g/dL — ABNORMAL HIGH (ref 3.6–4.8)
Alkaline Phosphatase: 69 IU/L (ref 39–117)
BUN/Creatinine Ratio: 14 (ref 10–22)
BUN: 13 mg/dL (ref 8–27)
Bilirubin Total: 1.1 mg/dL (ref 0.0–1.2)
CO2: 24 mmol/L (ref 18–29)
Calcium: 9.8 mg/dL (ref 8.6–10.2)
Chloride: 99 mmol/L (ref 97–108)
Creatinine, Ser: 0.94 mg/dL (ref 0.76–1.27)
GFR calc Af Amer: 101 mL/min/{1.73_m2} (ref >59)
GFR calc non Af Amer: 87 mL/min/{1.73_m2} (ref >59)
GLUCOSE: 89 mg/dL (ref 65–99)
Globulin, Total: 3.1 g/dL (ref 1.5–4.5)
POTASSIUM: 4.5 mmol/L (ref 3.5–5.2)
Sodium: 140 mmol/L (ref 134–144)
Total Protein: 8.1 g/dL (ref 6.0–8.5)

## 2015-03-09 LAB — TSH: TSH: 0.687 u[IU]/mL (ref 0.450–4.500)

## 2015-03-09 LAB — VITAMIN D 25 HYDROXY (VIT D DEFICIENCY, FRACTURES): VIT D 25 HYDROXY: 25.1 ng/mL — AB (ref 30.0–100.0)

## 2015-03-09 NOTE — Progress Notes (Signed)
Patient notified

## 2015-03-10 ENCOUNTER — Encounter: Payer: Self-pay | Admitting: Family Medicine

## 2015-03-21 ENCOUNTER — Ambulatory Visit (INDEPENDENT_AMBULATORY_CARE_PROVIDER_SITE_OTHER): Payer: BLUE CROSS/BLUE SHIELD | Admitting: Family Medicine

## 2015-03-21 ENCOUNTER — Encounter: Payer: Self-pay | Admitting: Family Medicine

## 2015-03-21 VITALS — BP 108/68 | HR 86 | Temp 97.7°F | Resp 16 | Ht 66.0 in | Wt 166.9 lb

## 2015-03-21 DIAGNOSIS — B356 Tinea cruris: Secondary | ICD-10-CM | POA: Diagnosis not present

## 2015-03-21 DIAGNOSIS — E1140 Type 2 diabetes mellitus with diabetic neuropathy, unspecified: Secondary | ICD-10-CM

## 2015-03-21 DIAGNOSIS — N521 Erectile dysfunction due to diseases classified elsewhere: Secondary | ICD-10-CM | POA: Diagnosis not present

## 2015-03-21 DIAGNOSIS — E1149 Type 2 diabetes mellitus with other diabetic neurological complication: Secondary | ICD-10-CM

## 2015-03-21 DIAGNOSIS — E559 Vitamin D deficiency, unspecified: Secondary | ICD-10-CM

## 2015-03-21 DIAGNOSIS — Z23 Encounter for immunization: Secondary | ICD-10-CM

## 2015-03-21 LAB — POCT UA - MICROALBUMIN: MICROALBUMIN (UR) POC: 20 mg/L

## 2015-03-21 MED ORDER — KETOCONAZOLE 2 % EX CREA: 1.0000 "application " | TOPICAL_CREAM | Freq: Two times a day (BID) | CUTANEOUS | Status: DC

## 2015-03-21 NOTE — Addendum Note (Signed)
Addended by: Inda Coke on: 03/21/2015 02:27 PM   Modules accepted: Orders

## 2015-03-21 NOTE — Patient Instructions (Signed)
B?nh ti?u ???ng tp 2 (Type 2 Diabetes Mellitus) B?nh ti?u ???ng tp 2, th??ng g?i ??n gi?n l ti?u ???ng tp 2, l m?t b?nh ko di (m?n tnh). Trong ti?u ???ng tp 2, tuy?n t?y khng s?n xu?t ?? insulin (hocmon), cc t? bo t ?p ?ng v?i insulin lm cho ( khng insulin), ho?c c? hai. Thng th??ng, insulin v?n chuy?n ???ng t? th?c ?n vo cc t? bo ? m. Cc t? bo ? m s? d?ng ???ng ?? s?n sinh ra n?ng l??ng. Thi?u h?t insulin ho?c khng ?p ?ng bnh th??ng v?i insulin gy ra l??ng ???ng d? th?a tch t? trong mu thay v ?i vo cc t? bo ? m. K?t qu? l, lm cho l??ng ???ng trong mu cao (t?ng ???ng huy?t). ?nh h??ng c?a hm l??ng ???ng (glucose) cao c th? gy ra nhi?u bi?n ch?ng.  B?nh ti?u ???ng tp 2 tr??c ?y cn ???c g?i l b?nh ti?u ???ng kh?i pht ? ng??i l?n, nh?ng n c th? x?y ra ? b?t c? l?a tu?i no.  CC Y?U T? NGUY C?  M?t ng??i d? b? b?nh ti?u ???ng tp 2 n?u c ai ? trong gia ?nh b? b?nh ny, ??ng th?i c m?t ho?c nhi?u y?u t? nguy c? chnh sau ?y:  Th?a cn.  L?i s?ng t ho?t ??ng.  Ti?n s? lin t?c ?n th?c ?n nhi?u n?ng l??ng. Duy tr cn n?ng bnh th??ng v ho?t ??ng thn th? th??ng xuyn c th? lm gi?m nguy c? pht tri?n b?nh ti?u ???ng tp 2. TRI?U CH?NG  Ban ??u, m?t ng??i b? b?nh ti?u ???ng tp 2 c th? khng c cc tri?u ch?ng. Cc tri?u ch?ng c?a b?nh ti?u ???ng tp 2 xu?t hi?n t? t?. Cc tri?u ch?ng bao g?m:  Kht n??c nhi?u (ch?ng kht nhi?u).  Ti?u ti?n nhi?u (?a ni?u).  ?i ti?u nhi?u vo ban ?m (ti?u ?m).  S?t cn. Hi?n t??ng gi?m cn ny c th? r?t nhanh.  Th??ng xuyn b? nhi?m trng ti pht.  M?t m?i (m?t)  Y?u.  Thay ??i th? l?c, ch?ng h?n nh? nhn m?.  Mi tri cy trong h?i th? c?a qu v?.  ?au b?ng.  Bu?n nn ho?c nn m?a.  V?t c?t ho?c v?t b?m tm lu lnh.  ?au bu?t ho?c t ? bn tay ho?c bn chn. CH?N ?ON B?nh ti?u ???ng tp 2 th??ng khng ???c ch?n ?on cho ??n khi xu?t hi?n cc bi?n ch?ng c?a b?nh ti?u ???ng. B?nh ti?u  ???ng tp 2 ???c ch?n ?on khi xu?t hi?n cc tri?u ch?ng c?ng nh? bi?n ch?ng v khi l??ng ???ng huy?t t?ng. L??ng ???ng huy?t c th? ???c ki?m tra b?ng m?t ho?c nhi?u xt nghi?m mu sau ?y:  Xt nghi?m ???ng huy?t lc ?i. Qu v? s? khng ???c php ?n trong t nh?t l 8 ti?ng tr??c khi l?y m?u mu.  Xt nghi?m ???ng huy?t ng?u nhin. ???ng huy?t ???c xt nghi?m b?t k? lc no trong ngy, b?t k? qu v? ?n lc no.  Xt nghi?m ???ng huy?t A1c hemoglobin. Xt nghi?m A1c hemoglobin cung c?p thng tin v? vi?c ki?m sot ???ng huy?t trong 3 thng tr??c ?.  Xt nghi?m dung n?p glucose theo ???ng u?ng (OGTT). ???ng huy?t c?a qu v? ???c ?o sau khi qu v? ch?a ?n (nh?n ?n) trong 2 gi? v sau ? l sau khi qu v? u?ng ?? u?ng c ch?a glucose. ?I?U TR?   Qu v? c th? c?n dng insulin ho?c thu?c tr? ti?u ???ng hng ngy ??  gi? cho l??ng ???ng huy?t trong ph?m vi mong mu?n.  N?u qu v? dng insulin, qu v? c th? c?n ?i?u ch?nh li?u thu?c ty thu?c vo l??ng carbohydrate m qu v? ?n trong m?i b?a ?n chnh ho?c b?a ?n nh?. M?c tiu ?i?u tr? l ?? duy tr l??ng ???ng trong mu tr??c b?a ?n (glucose tr??c ?n) ? m?c 70-130 mg/dL. H??NG D?N CH?M [place] T?I [place]   L??ng A1c hemoglobin c?a qu v? ???c ki?m tra hai l?n m?i n?m.  Th?c hi?n vi?c theo di ???ng huy?t hng ngy theo ch? d?n c?a chuyn gia ch?m [place] s?c kh?e.  Theo di [place] trong n??c ti?u khi qu v? b? b?nh v theo ch? d?n c?a chuyn gia ch?m [place] s?c kh?e.  S? d?ng thu?c tr? ti?u ???ng ho?c insulin theo ch? d?n c?a chuyn gia ch?m [place] s?c kh?e ?? duy tr l??ng ???ng huy?t trong ph?m vi mong mu?n.  Khng bao gi? ?? h?t thu?c tr? ti?u ???ng ho?c insulin. Thu?c c?n ph?i dng hng ngy.  N?u qu v? ?ang dng insulin, qu v? c th? ?i?u ch?nh l??ng insulin d?a vo l??ng carbohydrates qu v? ?n. Carbohydrate c th? lm t?ng l??ng ???ng huy?t nh?ng c?n ph?i bao g?m trong ch? ?? ?n u?ng c?a qu v?. Carbohydrate cung c?p vitamin, khong ch?t v ch?t x?, l  m?t ph?n thi?t y?u c?a ch? ?? ?n u?ng c l?i cho s?c kh?e. Carbohydrate ???c tm th?y trong tri cy, rau, ng? c?c, cc s?n ph?m t? s?a, cc lo?i ??u v cc lo?i th?c ph?m c b? sung thm ???ng.  ?n th?c ?n c l?i cho s?c kh?e. Qu v? c?n h?n g?p m?t chuyn gia dinh d??ng c ??ng k hnh ngh? ?? gip qu v? ??a ra m?t k? ho?ch ?n u?ng ph h?p.  Gi?m cn n?u qu v? th?a cn.  Mang theo th? c?nh bo y t? ho?c ?eo ?? trang s?c c c?nh bo y t?.  Mang theo ?? ?n nh? ch?a 15 gam carbohydrate m?i lc ?? ?i?u tr? h? ???ng huy?t (h? ???ng huy?t). M?t s? v d? v? ?? ?n nh? ch?a 15 gam carbohydrate bao g?m:  Vin glucose, 3 ho?c 4.  Gel glucose, ?ng 15 gam.  Nho kh, 2 mu?ng (24 gam).  Th?ch hnh h?t ??u, 6.  Bnh quy hnh con gi?ng, 8.  N??c u?ng c ga thng th??ng, 4 aox? (120 ml)  K?o chp chp, 9.  Nh?n bi?t h? ???ng huy?t. H? ???ng huy?t x?y ra khi l??ng ???ng huy?t t? 70 mg/dL tr? xu?ng. Nguy c? h? ???ng huy?t gia t?ng khi nh?n ?n ho?c b? b?a, trong v sau khi t?p th? d?c c??ng ?? cao v trong khi ng?. Cc tri?u ch?ng h? ???ng huy?t c th? bao g?m:  Run ho?c l?c.  Gi?m kh? n?ng t?p trung.  ?? m? hi.  Nh?p tim t?ng.  ?au ??u.  Kh mi?ng.  ?i.  D? b? kch thch.  Lo u.  Ng? khng yn.  Thay ??i l?i ni ho?c s? ph?i h?p.  B? l l?n.  ?i?u tr? h? ???ng huy?t k?p th?i. N?u qu v? t?nh to v c th? nu?t m?t cch an ton, hy theo quy t?c 15:15:  Dng 15-20 gam glucose ho?c carbohydrate c tc d?ng nhanh. L?a ch?n tc ??ng nhanh bao g?m gel glucose, vin glucose ho?c 4 aox? (120 ml) n??c p tri cy, soda bnh th??ng ho?c s?a t bo.  Ki?m tra l??ng ???ng huy?t c?a qu v?  15 pht sau khi u?ng glucose.  Dng t? 15-20 gam glucose tr? ln n?u l??ng ???ng huy?t ???c ?o l?i v?n ? m?c 70 mg/dL tr? xu?ng.  ?n theo b?a ?n bnh th??ng ho?c ?? ?n nh? trong vng 1 ti?ng sau khi l??ng ???ng huy?t tr? l?i bnh th??ng.  Hy c?nh gic v?i c?m gic r?t kht v ?i ti?u ti?n nhi?u  l?n h?n bnh th??ng v ?y l nh?ng d?u hi?u s?m c?a t?ng ???ng huy?t. Vi?c pht hi?n t?ng ???ng huy?t s?m cho php ?i?u tr? k?p th?i. ?i?u tr? t?ng ???ng huy?t theo ch? d?n c?a chuyn gia ch?m [place] s?c kh?e.  M?i tu?n tham gia vo t nh?t 150 pht ho?t ??ng thn th? v?i c??ng ?? trung bnh, phn b? trong t nh?t 3 ngy trong tu?n ho?c theo ch? d?n c?a chuyn gia ch?m [place] s?c kh?e. Ngoi ra, qu v? nn tham gia vo bi t?p c s?c c?n t nh?t 2 l?n m?t tu?n ho?c theo ch? d?n c?a chuyn gia ch?m [place] s?c kh?e. C? g?ng dnh khng qu 90 pht m?i l?n khng ho?t ??ng.  ?i?u ch?nh thu?c v l??ng th?c ?n khi c?n n?u qu v? b?t ??u m?t bi t?p ho?c m?t mn th? thao m?i.  Lm theo k? ho?ch trong ngy b? b?nh c?a qu v? b?t c? lc no m qu v? khng th? ?n ho?c u?ng nh? bnh th??ng.  Khng s? d?ng cc s?n ph?m thu?c l bao g?m thu?c l ht, thu?c l d?ng nhai ho?c thu?c l ?i?n t?. N?u qu v? c?n gip ?? ?? cai thu?c, hy h?i chuyn gia ch?m [place] s?c kh?e.  Gi?i h?n l??ng r??u qu v? u?ng khng qu 1 ly m?i ngy v?i ph? n? khng mang thai v 2 ly m?i ngy v?i nam gi?i. Qu v? ch? nn u?ng r??u khi ?n. Ni chuy?n v?i chuyn gia ch?m [place] s?c kh?e xem u?ng r??u c an ton cho qu v? hay khng. Cho chuyn gia ch?m [place] s?c kh?e bi?t n?u qu v? u?ng r??u vi l?n m?i tu?n.  Tun th? m?i cu?c h?n khm l?i theo ch? d?n c?a chuyn gia ch?m [place] s?c kh?e. ?i?u ny l quan tr?ng.  S?p x?p bu?i khm m?t ngay sau khi ch?n ?on b?nh ti?u ???ng tp 2 v sau ? l hng n?m.  Th?c hi?n ch?m [place] da v bn chn hng ngy. Ki?m tra da v bn chn hng ngy xem c v?t c?t, v?t b?m tm, t?y ??, v?n ?? v? mng, ch?y mu, m?n n??c hay l? lot khng. Bn chn c?n ???c chuyn gia ch?m [place] s?c kh?e khm hng n?m.  ?nh r?ng v l?i t nh?t hai l?n m?i ngy v dng ch? nha khoa t nh?t m?t l?n m?i ngy. G?p nha s? ?? khm l?i th??ng xuyn.  Chia s? k? ho?ch qu?n l b?nh ti?u ???ng c?a qu v? ? n?i lm vi?c ho?c tr??ng h?c c?a qu  v?.  Lun c?p nh?t vi?c tim ch?ng. Ng??i b? b?nh ti?u ???ng trn 65 tu?i ???c khuy?n ngh? tim v?cxin vim ph?i. Trong m?t s? tr??ng h?p, qu v? c th? ???c tim hai m?i khc nhau. H?i chuyn gia ch?m [place] s?c kh?e xem vi?c tim phng vim ph?i c?a qu v? c c?p nh?t khng.  H?c cch qu?n l c?ng th?ng.  Xin ???c gio d?c v h? tr? v? b?nh ti?u ???ng th??ng xuyn khi c?n.  Tham gia ho?c tm cch ph?c h?i ch?c n?ng khi c?n thi?t ??  duy trì ho?c c?i thi?n kh? n?ng ??c l?p và ch?t l??ng cu?c s?ng. Yêu c?u chuy?n sang v?t lý tr? li?u ho?c li?u pháp ngh? nghi?p n?u quý v? b? tê bàn chân ho?c tê tay, ho?c khó ch?i ??u, khó m?c qu?n áo, ?n u?ng ho?c ho?t ??ng th? ch?t. °?I KHÁM N?U:  °· Quý v? không th? ?n ho?c u?ng trong h?n 6 ti?ng. °· Quý v? b? bu?n nôn và nôn m?a trong h?n 6 ti?ng. °· L??ng ???ng huy?t c?a quý v? cao trên 240 mg/dL. °· Có thay ??i tr?ng thái tinh th?n. °· Quý v? b? thêm m?t c?n b?nh nghiêm tr?ng. °· Quý v? b? tiêu ch?y trong h?n 6 ti?ng. °· Quý v? ?ã b? ?m ho?c b? s?t trong m?t vài ngày và không ?? h?n. °· Quý v? b? ?au trong khi tham gia b?t k? ho?t ??ng thân th? nào. °NGAY L?P T?C ?I KHÁM N?U: °· Quý v? b? khó th?. °· Quý v? có l??ng ketone ? m?c trung bình ??n cao. °??M B?O QUÝ V?: °· Hi?u rõ các h??ng d?n này. °· S? theo dõi tình tr?ng c?a mình. °· S? yêu c?u tr? giúp ngay l?p t?c n?u quý v? c?m th?y không kh?e ho?c th?y tr?m tr?ng h?n. °Document Released: 06/03/2005 Document Revised: 10/18/2013 °ExitCare® Patient Information ©2015 [employer]. This information is not intended to replace advice given to you by your health care provider. Make sure you discuss any questions you have with your health care provider. ° °

## 2015-03-21 NOTE — Progress Notes (Signed)
Name: David Skinner   MRN: [id number]    DOB: 1951-06-14   Date:03/21/2015       Progress Note  Subjective  Chief Complaint  Chief Complaint  Patient presents with  . New Onset of DM    Still tired and having blurred vision.    HPI  DMII : his glucose has been elevated in the past, and he was having fatigue. HgbA1C done on 03/09/2015 showed hgbA1C of 6.5%. He has occasional blurred vision. He also has ED. He denies polyphagia, polyuria or polydipsia. His diet is rich in carbohydrates. He has noticed episodes of hypoglycemia when he skips a meal - states his legs and hands gets shaky.  Family history is negative for DM. He is the first generation [place] that moved to [place].  Reviewed all the labs with him. Triglycerides was also elevated.  Tinea Cruris: he has a itchy rash on groin, responded well to therapy in the past and needs a refill.   ED: taking Levitra and denies side effects  Patient Active Problem List   Diagnosis Date Noted  . Tinea cruris 03/21/2015  . 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 Depression with anxiety 11/28/2014  . Chronic constipation 11/28/2014  . Insomnia 11/28/2014  . Dyslipidemia 11/28/2014  . Fatty infiltration of liver 11/28/2014  . Essential (primary) hypertension 11/28/2014  . Gastric reflux 11/28/2014  . Memory loss or impairment 11/28/2014  . Perennial allergic rhinitis with seasonal variation 11/28/2014  . Vitamin D deficiency 11/28/2014  . Tenosynovitis of thumb 11/28/2014  . Localization-related focal epilepsy with complex partial seizures ([place]) 11/16/2014    Past Surgical History  Procedure Laterality Date  . Lasik Bilateral     Family History  Problem Relation Age of Onset  . Family history unknown: Yes    Social History   Social History  . Marital Status: Single    Spouse Name: N/A  . Number of Children: N/A  . Years of Education: N/A   Occupational History  . Not on  file.   Social History Main Topics  . Smoking status: Former Smoker -- 1.00 packs/day for 15 years    Types: Cigarettes    Start date: 06/17/1984    Quit date: 11/28/1999  . Smokeless tobacco: Not on file  . Alcohol Use: No  . Drug Use: No  . Sexual Activity:    Partners: Female   Other Topics Concern  . Not on file   Social History Narrative     Current outpatient prescriptions:  .  atorvastatin (LIPITOR) 40 MG tablet, Take 1 tablet (40 mg total) by mouth daily., Disp: 90 tablet, Rfl: 1 .  escitalopram (LEXAPRO) 20 MG tablet, Take 1 tablet (20 mg total) by mouth daily., Disp: 90 tablet, Rfl: 1 .  fluticasone (FLONASE) 50 MCG/ACT nasal spray, Place 2 sprays into both nostrils at bedtime as needed., Disp: 48 g, Rfl: 1 .  ketoconazole (NIZORAL) 2 % cream, Apply 1 application topically 2 (two) times daily., Disp: 60 g, Rfl: 2 .  levETIRAcetam (KEPPRA) 500 MG tablet, Take 250 mg by mouth 2 (two) times daily., Disp: , Rfl: 0 .  loratadine (CLARITIN) 10 MG tablet, Take 1 tablet (10 mg total) by mouth 2 (two) times daily., Disp: 180 tablet, Rfl: 1 .  losartan (COZAAR) 50 MG tablet, Take 1 tablet (50 mg total) by mouth daily., Disp: 90 tablet, Rfl: 1 .  montelukast (SINGULAIR) 10 MG tablet, Take 1 tablet (10  mg total) by mouth daily., Disp: 90 tablet, Rfl: 1 .  polyethylene glycol powder (GLYCOLAX/MIRALAX) powder, Take 17 g by mouth daily., Disp: 850 g, Rfl: 5 .  ranitidine (ZANTAC) 300 MG tablet, Take 1 tablet (300 mg total) by mouth daily., Disp: 90 tablet, Rfl: 1  Allergies  Allergen Reactions  . Ibuprofen Itching  . Levofloxacin   . Lisinopril Cough  . Metoprolol   . Nsaids Itching  . Tramadol Hcl      ROS  Constitutional: Negative for fever or weight change.  Respiratory: Occasional  for cough negative  shortness of breath.   Cardiovascular: Negative for chest pain or palpitations.  Gastrointestinal: Negative for abdominal pain, no bowel changes.  Musculoskeletal:  Negative for gait problem or joint swelling.  Skin: positive  for rash.  Neurological: Negative for dizziness or headache.  No other specific complaints in a complete review of systems (except as listed in HPI above).  Objective  Filed Vitals:   03/21/15 1350  BP: 108/68  Pulse: 86  Temp: 97.7 F (36.5 C)  TempSrc: Oral  Resp: 16  Height: 5\' 6"  (1.676 m)  Weight: 166 lb 14.4 oz (75.705 kg)  SpO2: 95%    Body mass index is 26.95 kg/(m^2).  Physical Exam  Constitutional: Patient appears well-developed and well-nourished.No distress.  HEENT: head atraumatic, normocephalic, pupils equal and reactive to light,  neck supple, throat within normal limits Cardiovascular: Normal rate, regular rhythm and normal heart sounds.  No murmur heard. No BLE edema. Pulmonary/Chest: Effort normal and breath sounds normal. No respiratory distress. Abdominal: Soft.  There is no tenderness. Psychiatric: Patient has a normal mood and affect. behavior is normal. Judgment and thought content normal. Skin: hyperpigmentation from tinea on supra pubic area, mild on groin area, dry, no oozing   Recent Results (from the past 2160 hour(s))  Lipid panel     Status: Abnormal   Collection Time: 03/08/15 12:15 PM  Result Value Ref Range   Cholesterol, Total 183 100 - 199 mg/dL   Triglycerides 160 (H) 0 - 149 mg/dL   HDL 51 >39 mg/dL    Comment: According to ATP-III Guidelines, HDL-C >59 mg/dL is considered a negative risk factor for CHD.    VLDL Cholesterol Cal 32 5 - 40 mg/dL   LDL Calculated 100 (H) 0 - 99 mg/dL   Chol/HDL Ratio 3.6 0.0 - 5.0 ratio units    Comment:                                   T. Chol/HDL Ratio                                             Men  Women                               1/2 Avg.Risk  3.4    3.3                                   Avg.Risk  5.0    4.4  2X Avg.Risk  9.6    7.1                                3X Avg.Risk 23.4   11.0   Vit D  25  hydroxy (rtn osteoporosis monitoring)     Status: Abnormal   Collection Time: 03/08/15 12:15 PM  Result Value Ref Range   Vit D, 25-Hydroxy 25.1 (L) 30.0 - 100.0 ng/mL    Comment: Vitamin D deficiency has been defined by the [place] practice guideline as a level of serum 25-OH vitamin D less than 20 ng/mL (1,2). The Endocrine Society went on to further define vitamin D insufficiency as a level between 21 and 29 ng/mL (2). 1. IOM (Institute of Medicine). 2010. Dietary reference    intakes for calcium and D. [place]: The    [employer]. 2. Holick MF, Binkley Hector, Bischoff-Ferrari HA, et al.    Evaluation, treatment, and prevention of vitamin D    deficiency: an Endocrine Society clinical practice    guideline. JCEM. 2011 Jul; 96(7):1911-30.   TSH     Status: None   Collection Time: 03/08/15 12:15 PM  Result Value Ref Range   TSH 0.687 0.450 - 4.500 uIU/mL  Comprehensive metabolic panel     Status: Abnormal   Collection Time: 03/08/15 12:15 PM  Result Value Ref Range   Glucose 89 65 - 99 mg/dL   BUN 13 8 - 27 mg/dL   Creatinine, Ser 0.94 0.76 - 1.27 mg/dL   GFR calc non Af Amer 87 >59 mL/min/1.73   GFR calc Af Amer 101 >59 mL/min/1.73   BUN/Creatinine Ratio 14 10 - 22   Sodium 140 134 - 144 mmol/L   Potassium 4.5 3.5 - 5.2 mmol/L   Chloride 99 97 - 108 mmol/L   CO2 24 18 - 29 mmol/L   Calcium 9.8 8.6 - 10.2 mg/dL   Total Protein 8.1 6.0 - 8.5 g/dL   Albumin 5.0 (H) 3.6 - 4.8 g/dL   Globulin, Total 3.1 1.5 - 4.5 g/dL   Albumin/Globulin Ratio 1.6 1.1 - 2.5   Bilirubin Total 1.1 0.0 - 1.2 mg/dL   Alkaline Phosphatase 69 39 - 117 IU/L   AST 35 0 - 40 IU/L   ALT 67 (H) 0 - 44 IU/L  Hemoglobin A1c     Status: Abnormal   Collection Time: 03/08/15 12:15 PM  Result Value Ref Range   Hgb A1c MFr Bld 6.5 (H) 4.8 - 5.6 %    Comment:          Pre-diabetes: 5.7 - 6.4          Diabetes: >6.4          Glycemic control for adults  with diabetes: <7.0    Est. average glucose Bld gHb Est-mCnc 140 mg/dL    Diabetic Foot Exam: Diabetic Foot Exam - Simple   Simple Foot Form  Visual Inspection  No deformities, no ulcerations, no other skin breakdown bilaterally:  Yes  Sensation Testing  Intact to touch and monofilament testing bilaterally:  Yes  Pulse Check  Posterior Tibialis and Dorsalis pulse intact bilaterally:  Yes  Comments       PHQ2/9: Depression screen [facility] 2/9 03/08/2015 11/28/2014  Decreased Interest 0 0  Down, Depressed, Hopeless 0 0  PHQ - 2 Score 0 0     Fall Risk: Fall Risk  03/08/2015 11/28/2014  Falls in the past year? No No     Assessment & Plan  1. 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 Discussed medications, but he would like to try life style modification first. Also explained importance of not skipping meals and stop eating instant noodles for breakfast  -referral Ophthalmologist   2. Vitamin D deficiency  Continue otc vitamin D supplementation  3. Tinea cruris  - ketoconazole (NIZORAL) 2 % cream; Apply 1 application topically 2 (two) times daily.  Dispense: 60 g; Refill: 2

## 2015-05-31 ENCOUNTER — Other Ambulatory Visit: Payer: Self-pay | Admitting: Family Medicine

## 2015-05-31 NOTE — Telephone Encounter (Signed)
Patient requesting refill. 

## 2015-06-07 ENCOUNTER — Ambulatory Visit: Payer: BLUE CROSS/BLUE SHIELD | Admitting: Family Medicine

## 2015-06-15 ENCOUNTER — Encounter: Payer: Self-pay | Admitting: Family Medicine

## 2015-06-15 ENCOUNTER — Ambulatory Visit (INDEPENDENT_AMBULATORY_CARE_PROVIDER_SITE_OTHER): Payer: BLUE CROSS/BLUE SHIELD | Admitting: Family Medicine

## 2015-06-15 VITALS — BP 106/82 | HR 94 | Temp 97.6°F | Resp 18 | Ht 66.0 in | Wt 170.0 lb

## 2015-06-15 DIAGNOSIS — E1149 Type 2 diabetes mellitus with other diabetic neurological complication: Secondary | ICD-10-CM

## 2015-06-15 DIAGNOSIS — N521 Erectile dysfunction due to diseases classified elsewhere: Secondary | ICD-10-CM | POA: Diagnosis not present

## 2015-06-15 DIAGNOSIS — R251 Tremor, unspecified: Secondary | ICD-10-CM | POA: Diagnosis not present

## 2015-06-15 DIAGNOSIS — F418 Other specified anxiety disorders: Secondary | ICD-10-CM

## 2015-06-15 DIAGNOSIS — I10 Essential (primary) hypertension: Secondary | ICD-10-CM | POA: Diagnosis not present

## 2015-06-15 DIAGNOSIS — E785 Hyperlipidemia, unspecified: Secondary | ICD-10-CM | POA: Diagnosis not present

## 2015-06-15 DIAGNOSIS — G40209 Localization-related (focal) (partial) symptomatic epilepsy and epileptic syndromes with complex partial seizures, not intractable, without status epilepticus: Secondary | ICD-10-CM | POA: Diagnosis not present

## 2015-06-15 DIAGNOSIS — J302 Other seasonal allergic rhinitis: Secondary | ICD-10-CM

## 2015-06-15 DIAGNOSIS — R05 Cough: Secondary | ICD-10-CM

## 2015-06-15 DIAGNOSIS — J309 Allergic rhinitis, unspecified: Secondary | ICD-10-CM

## 2015-06-15 DIAGNOSIS — E1140 Type 2 diabetes mellitus with diabetic neuropathy, unspecified: Secondary | ICD-10-CM

## 2015-06-15 DIAGNOSIS — J3089 Other allergic rhinitis: Secondary | ICD-10-CM

## 2015-06-15 DIAGNOSIS — R059 Cough, unspecified: Secondary | ICD-10-CM

## 2015-06-15 LAB — POCT GLYCOSYLATED HEMOGLOBIN (HGB A1C): HEMOGLOBIN A1C: 6.3

## 2015-06-15 MED ORDER — ATORVASTATIN CALCIUM 40 MG PO TABS: 40.0000 mg | ORAL_TABLET | Freq: Every day | ORAL | Status: DC

## 2015-06-15 MED ORDER — LOSARTAN POTASSIUM 25 MG PO TABS: 25.0000 mg | ORAL_TABLET | Freq: Every day | ORAL | Status: DC

## 2015-06-15 MED ORDER — ASPIRIN EC 81 MG PO TBEC: 81.0000 mg | DELAYED_RELEASE_TABLET | Freq: Every day | ORAL | Status: DC

## 2015-06-15 MED ORDER — MONTELUKAST SODIUM 10 MG PO TABS: 10.0000 mg | ORAL_TABLET | Freq: Every day | ORAL | Status: DC

## 2015-06-15 NOTE — Progress Notes (Signed)
Name: David Skinner   MRN: [id number]    DOB: 1951-03-03   Date:06/15/2015       Progress Note  Subjective  Chief Complaint  Chief Complaint  Patient presents with  . Medication Refill    3 month F/U  . Hypertension    Still having headaches, unchanged-Neurologist wanted patient to speak to Dr. Ancil Boozer about her going up on his BP medication due to him having headaches  . Hyperlipidemia  . Allergic Rhinitis     Dry Cough and headaches  . Anxiety    HPI  Tremors: he has noticed tremors with activity, fine tremors, not affecting his life. It has been present for many years. It seems worse when he skips meals. It may be hypoglycemia.   HTN: his bp has been towards low end of normal and we will adjust his dose. He gets dizzy occasionally, no chest pain.   Hyperlipidemia: doing well on statin, advised to start taking aspirin 81 mg daily   AR: he is taking Loratadine and Singulair, he has noticed a mild dry cough and mild headaches. He has some SOB with activity no wheezing. He smoked in the past but quit over 10 years ago.   Anxiety/Depression: he stopped taking Lexapro, feeling better and does not want to take medications  Partial Seizure: he was taking anti seizure medication but it was stopped by Dr. Manuella Ghazi about on month ago and no recurrence of episodes of confusion or memory loss.   Diabetes Type 2 diet controlled with ED: doing well on life style modification only - mostly dietary, he signed up for [employer] but has not gone yet. No polyphagia, polydipsia or polyuria   Patient Active Problem List   Diagnosis Date Noted  . Tinea cruris 03/21/2015  . 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 Depression with anxiety 11/28/2014  . Chronic constipation 11/28/2014  . Insomnia 11/28/2014  . Dyslipidemia 11/28/2014  . Fatty infiltration of liver 11/28/2014  . Essential (primary) hypertension 11/28/2014  . Gastric reflux  11/28/2014  . Memory loss or impairment 11/28/2014  . Perennial allergic rhinitis with seasonal variation 11/28/2014  . Vitamin D deficiency 11/28/2014  . Tenosynovitis of thumb 11/28/2014  . Partial epilepsy with impairment of consciousness ([place]) 11/16/2014    Past Surgical History  Procedure Laterality Date  . Lasik Bilateral     Family History  Problem Relation Age of Onset  . Family history unknown: Yes    Social History   Social History  . Marital Status: Single    Spouse Name: N/A  . Number of Children: N/A  . Years of Education: N/A   Occupational History  . Not on file.   Social History Main Topics  . Smoking status: Former Smoker -- 1.00 packs/day for 15 years    Types: Cigarettes    Start date: 06/17/1984    Quit date: 11/28/1999  . Smokeless tobacco: Not on file  . Alcohol Use: No  . Drug Use: No  . Sexual Activity:    Partners: Female   Other Topics Concern  . Not on file   Social History Narrative     Current outpatient prescriptions:  .  atorvastatin (LIPITOR) 40 MG tablet, Take 1 tablet (40 mg total) by mouth daily., Disp: 90 tablet, Rfl: 1 .  fluticasone (FLONASE) 50 MCG/ACT nasal spray, Place 2 sprays into both nostrils at bedtime as needed., Disp: 48 g, Rfl: 1 .  ketoconazole (NIZORAL) 2 %  cream, Apply 1 application topically 2 (two) times daily., Disp: 60 g, Rfl: 2 .  loratadine (CLARITIN) 10 MG tablet, Take 1 tablet (10 mg total) by mouth 2 (two) times daily., Disp: 180 tablet, Rfl: 1 .  losartan (COZAAR) 25 MG tablet, Take 1 tablet (25 mg total) by mouth daily., Disp: 90 tablet, Rfl: 1 .  montelukast (SINGULAIR) 10 MG tablet, Take 1 tablet (10 mg total) by mouth daily., Disp: 90 tablet, Rfl: 1  Allergies  Allergen Reactions  . Ibuprofen Itching  . Levofloxacin   . Lisinopril Cough  . Metoprolol   . Nsaids Itching  . Tramadol Hcl      ROS  Constitutional: Negative for fever or weight change.  Respiratory: Positive  for cough and  shortness of breath.   Cardiovascular: Negative for chest pain or palpitations.  Gastrointestinal: Negative for abdominal pain, no bowel changes.  Musculoskeletal: Negative for gait problem or joint swelling.  Skin: Negative for rash.  Neurological:Positive for dizziness or headache.  No other specific complaints in a complete review of systems (except as listed in HPI above).  Objective  Filed Vitals:   06/15/15 1017  BP: 106/82  Pulse: 94  Temp: 97.6 F (36.4 C)  TempSrc: Oral  Resp: 18  Height: 5\' 6"  (1.676 m)  Weight: 170 lb (77.111 kg)  SpO2: 96%    Body mass index is 27.45 kg/(m^2).  Physical Exam  Constitutional: Patient appears well-developed and well-nourished.  No distress.  HEENT: head atraumatic, normocephalic, pupils equal and reactive to light, ears TM, neck supple, throat within normal limits Cardiovascular: Normal rate, regular rhythm and normal heart sounds.  No murmur heard. No BLE edema. Pulmonary/Chest: Effort normal and breath sounds normal. No respiratory distress. Abdominal: Soft.  There is no tenderness. Psychiatric: Patient has a normal mood and affect. behavior is normal. Judgment and thought content normal.  Recent Results (from the past 2160 hour(s))  POCT UA - Microalbumin     Status: None   Collection Time: 03/21/15  2:25 PM  Result Value Ref Range   Microalbumin Ur, POC 20 mg/L   Creatinine, POC  mg/dL   Albumin/Creatinine Ratio, Urine, POC    POCT HgB A1C     Status: None   Collection Time: 06/15/15 10:57 AM  Result Value Ref Range   Hemoglobin A1C 6.3      PHQ2/9: Depression screen [facility] 2/9 06/15/2015 03/08/2015 11/28/2014  Decreased Interest 0 0 0  Down, Depressed, Hopeless 0 0 0  PHQ - 2 Score 0 0 0     Fall Risk: Fall Risk  06/15/2015 03/08/2015 11/28/2014  Falls in the past year? No No No     Functional Status Survey: Is the patient deaf or have difficulty hearing?: No Does the patient have difficulty seeing, even when  wearing glasses/contacts?: No Does the patient have difficulty concentrating, remembering, or making decisions?: No Does the patient have difficulty walking or climbing stairs?: No Does the patient have difficulty dressing or bathing?: No Does the patient have difficulty doing errands alone such as visiting a doctor's office or shopping?: No   Assessment & Plan  1. 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 Dyslipidemia  - atorvastatin (LIPITOR) 40 MG tablet; Take 1 tablet (40 mg total) by mouth daily.  Dispense: 90 tablet; Refill: 1  3. Essential (primary) hypertension  - losartan (COZAAR) 25 MG tablet; Take 1 tablet (25 mg total) by mouth daily.  Dispense: 90 tablet; Refill: 1  4. Perennial allergic rhinitis with seasonal variation  - montelukast (SINGULAIR) 10 MG tablet; Take 1 tablet (10 mg total) by mouth daily.  Dispense: 90 tablet; Refill: 1  5. Depression with anxiety  He stopped medication and is doing well at this time   6. Partial epilepsy with impairment of consciousness ([place])  Seen by Dr. Manuella Ghazi, off anti-seizure medication   7. Tremors of nervous system  He wants to hold off on therapy at this time, discussed familial tremors and importance of not skipping meals  8. Cough   He states it is chronic, previous history of tobacco use, quit 10 years ago we will check CXR and spirometry today - DG Chest 2 View; Future - Spirometry: normal

## 2015-09-13 ENCOUNTER — Ambulatory Visit: Payer: BLUE CROSS/BLUE SHIELD | Admitting: Family Medicine

## 2015-10-03 ENCOUNTER — Other Ambulatory Visit: Payer: Self-pay | Admitting: Family Medicine

## 2015-10-03 NOTE — Telephone Encounter (Signed)
Left voice message to inform patient that prescription has been called in and we need to schedule follow up visit

## 2015-11-08 ENCOUNTER — Encounter: Payer: Self-pay | Admitting: Family Medicine

## 2015-11-08 ENCOUNTER — Ambulatory Visit (INDEPENDENT_AMBULATORY_CARE_PROVIDER_SITE_OTHER): Payer: BLUE CROSS/BLUE SHIELD | Admitting: Family Medicine

## 2015-11-08 VITALS — BP 104/76 | HR 90 | Temp 97.5°F | Resp 16 | Ht 66.0 in | Wt 171.7 lb

## 2015-11-08 DIAGNOSIS — M25512 Pain in left shoulder: Secondary | ICD-10-CM

## 2015-11-08 DIAGNOSIS — M6588 Other synovitis and tenosynovitis, other site: Secondary | ICD-10-CM

## 2015-11-08 DIAGNOSIS — J302 Other seasonal allergic rhinitis: Secondary | ICD-10-CM

## 2015-11-08 DIAGNOSIS — E1149 Type 2 diabetes mellitus with other diabetic neurological complication: Secondary | ICD-10-CM

## 2015-11-08 DIAGNOSIS — E785 Hyperlipidemia, unspecified: Secondary | ICD-10-CM | POA: Diagnosis not present

## 2015-11-08 DIAGNOSIS — R5383 Other fatigue: Secondary | ICD-10-CM | POA: Diagnosis not present

## 2015-11-08 DIAGNOSIS — E1140 Type 2 diabetes mellitus with diabetic neuropathy, unspecified: Secondary | ICD-10-CM

## 2015-11-08 DIAGNOSIS — K5909 Other constipation: Secondary | ICD-10-CM

## 2015-11-08 DIAGNOSIS — I10 Essential (primary) hypertension: Secondary | ICD-10-CM | POA: Diagnosis not present

## 2015-11-08 DIAGNOSIS — K5900 Constipation, unspecified: Secondary | ICD-10-CM | POA: Diagnosis not present

## 2015-11-08 DIAGNOSIS — J309 Allergic rhinitis, unspecified: Secondary | ICD-10-CM

## 2015-11-08 DIAGNOSIS — N521 Erectile dysfunction due to diseases classified elsewhere: Secondary | ICD-10-CM | POA: Diagnosis not present

## 2015-11-08 DIAGNOSIS — G40209 Localization-related (focal) (partial) symptomatic epilepsy and epileptic syndromes with complex partial seizures, not intractable, without status epilepticus: Secondary | ICD-10-CM | POA: Diagnosis not present

## 2015-11-08 DIAGNOSIS — M778 Other enthesopathies, not elsewhere classified: Secondary | ICD-10-CM

## 2015-11-08 DIAGNOSIS — R062 Wheezing: Secondary | ICD-10-CM

## 2015-11-08 DIAGNOSIS — M779 Enthesopathy, unspecified: Secondary | ICD-10-CM

## 2015-11-08 DIAGNOSIS — J3089 Other allergic rhinitis: Secondary | ICD-10-CM

## 2015-11-08 LAB — POCT GLYCOSYLATED HEMOGLOBIN (HGB A1C): HEMOGLOBIN A1C: 6.9

## 2015-11-08 MED ORDER — MONTELUKAST SODIUM 10 MG PO TABS: 10.0000 mg | ORAL_TABLET | Freq: Every day | ORAL | Status: DC

## 2015-11-08 MED ORDER — METFORMIN HCL ER 500 MG PO TB24: 500.0000 mg | ORAL_TABLET | Freq: Every day | ORAL | Status: DC

## 2015-11-08 MED ORDER — LORATADINE 10 MG PO TABS
10.0000 mg | ORAL_TABLET | Freq: Two times a day (BID) | ORAL | Status: DC
Start: 2015-11-08 — End: 2015-11-08

## 2015-11-08 MED ORDER — ATORVASTATIN CALCIUM 40 MG PO TABS: 40.0000 mg | ORAL_TABLET | Freq: Every day | ORAL | Status: DC

## 2015-11-08 MED ORDER — LORATADINE 10 MG PO TABS: 10.0000 mg | ORAL_TABLET | Freq: Two times a day (BID) | ORAL | Status: DC

## 2015-11-08 MED ORDER — LOSARTAN POTASSIUM 25 MG PO TABS: 25.0000 mg | ORAL_TABLET | Freq: Every day | ORAL | Status: DC

## 2015-11-08 NOTE — Progress Notes (Signed)
Name: David Skinner   MRN: [id number]    DOB: 30-Oct-1950   Date:11/08/2015       Progress Note  Subjective  Chief Complaint  Chief Complaint  Patient presents with  . Diabetes    Patient does not check sugar at home  . Depression  . Dyslipidemia  . Allergic Rhinitis   . Tremors  . Partial epilepsy with impairment of consciousness  . Hypertension  . Medication Refill    patient is here for his 22-month f/u  . Arm Pain    patient presents with left arm pain for about 2-3 months that goes from his wrist up to his shoulder.  . Wrist Pain    right wrist pain for the past week   We used the phone translator during his visit today  HPI  Wheezing: he has a history of smoking for many years about 1 pack daily, but quit 10 years ago. Over the past year he has noticed some SOB and wheezing, worse with activity, no orthopnea. Occasionally has a dry cough.   HTN: his bp has been towards low end of normal, he is on low dose Losartan, advised no monitor at home and may cut down in half and wean off if bp stays within normal limits.   Hyperlipidemia: doing well on statin, advised to start taking aspirin 81 mg daily . Recheck labs yearly   AR: he is taking Loratadine and Singulair, he has noticed a mild dry cough He has some SOB with activity and now also wheezing. He smoked in the past but quit over 10 years ago.   Anxiety/Depression: he stopped taking Lexapro, feeling better and does not want to take medications. No crying spells or suicidal thoughts   Partial Seizure: he was taking anti seizure medication but it was stopped by Dr. Manuella Ghazi about 6 months and no recurrence of episodes of confusion or memory loss.   Diabetes Type 2 diet controlled with ED, hgbA1C has gone up to 6.9% and he is willing to start Metformin. Discussed possible side effects. He denies polyphagia or polyuria or polydipsia. He skips meals and gets shaky at times. Not checking fsbs at home  Wrist pain: he works as a Pharmacist, community, and both wrists have been sore for over one year, however the left side is getting worse, and pain has been radiating to left upper arm and shoulder, no swelling or increase in warmth, pain is described a aching, intermittent and worse with activity but better with rest. He can't take nsaid's  Fatigue: going on for months, tired, no snoring, just gets tired and wants labs to be done  Patient Active Problem List   Diagnosis Date Noted  . Tinea cruris 03/21/2015  . 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 Depression with anxiety 11/28/2014  . Chronic constipation 11/28/2014  . Insomnia 11/28/2014  . Dyslipidemia 11/28/2014  . Fatty infiltration of liver 11/28/2014  . Essential (primary) hypertension 11/28/2014  . Gastric reflux 11/28/2014  . Memory loss or impairment 11/28/2014  . Perennial allergic rhinitis with seasonal variation 11/28/2014  . Vitamin D deficiency 11/28/2014  . Tenosynovitis of thumb 11/28/2014  . Partial epilepsy with impairment of consciousness ([place]) 11/16/2014    Past Surgical History  Procedure Laterality Date  . Lasik Bilateral     Family History  Problem Relation Age of Onset  . Family history unknown: Yes    Social History   Social History  .  Marital Status: Single    Spouse Name: N/A  . Number of Children: N/A  . Years of Education: N/A   Occupational History  . nail technician    Social History Main Topics  . Smoking status: Former Smoker -- 1.00 packs/day for 15 years    Types: Cigarettes    Start date: 06/17/1984    Quit date: 11/28/1999  . Smokeless tobacco: Not on file  . Alcohol Use: No  . Drug Use: No  . Sexual Activity:    Partners: Female   Other Topics Concern  . 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  aspirin EC 81 MG tablet, Take 1 tablet (81 mg total) by mouth daily., Disp:  30 tablet, Rfl: 0 .  atorvastatin (LIPITOR) 40 MG tablet, Take 1 tablet (40 mg total) by mouth daily., Disp: 90 tablet, Rfl: 1 .  fluticasone (FLONASE) 50 MCG/ACT nasal spray, instill 2 sprays into each nostril at bedtime if needed, Disp: 16 g, Rfl: 0 .  ketoconazole (NIZORAL) 2 % cream, Apply 1 application topically 2 (two) times daily., Disp: 60 g, Rfl: 2 .  levETIRAcetam (KEPPRA) 500 MG tablet, Take 250 mg by mouth 2 (two) times daily., Disp: , Rfl: 0 .  loratadine (CLARITIN) 10 MG tablet, Take 1 tablet (10 mg total) by mouth 2 (two) times daily., Disp: 180 tablet, Rfl: 1 .  losartan (COZAAR) 25 MG tablet, Take 1 tablet (25 mg total) by mouth daily., Disp: 90 tablet, Rfl: 1 .  montelukast (SINGULAIR) 10 MG tablet, Take 1 tablet (10 mg total) by mouth daily., Disp: 90 tablet, Rfl: 1 .  metFORMIN (GLUCOPHAGE-XR) 500 MG 24 hr tablet, Take 1 tablet (500 mg total) by mouth daily with breakfast., Disp: 90 tablet, Rfl: 0  Allergies  Allergen Reactions  . Ibuprofen Itching  . Levofloxacin   . Lisinopril Cough  . Metoprolol   . Nsaids Itching  . Tramadol Hcl      ROS  Constitutional: Negative for fever or weight change.  Respiratory: Positive  for cough and shortness of breath.   Cardiovascular: Negative for chest pain or palpitations.  Gastrointestinal: Negative for abdominal pain, no bowel changes.  Musculoskeletal: Negative for gait problem or joint swelling.  Skin: Negative for rash.  Neurological: Negative for dizziness or headache.  No other specific complaints in a complete review of systems (except as listed in HPI above).  Objective  Filed Vitals:   11/08/15 1002  BP: 104/76  Pulse: 90  Temp: 97.5 F (36.4 C)  TempSrc: Oral  Resp: 16  Weight: 171 lb 11.2 oz (77.883 kg)  SpO2: 96%    Body mass index is 27.73 kg/(m^2).  Physical Exam  Constitutional: Patient appears well-developed and well-nourished.No distress.  HEENT: head atraumatic, normocephalic, pupils equal  and reactive to light,neck supple, throat within normal limits Cardiovascular: Normal rate, regular rhythm and normal heart sounds.  No murmur heard. No BLE edema. Pulmonary/Chest: Effort normal and breath sounds normal. No respiratory distress. Abdominal: Soft.  There is no tenderness. Psychiatric: Patient has a normal mood and affect. behavior is normal. Judgment and thought content normal. Muscular skeletal: left shoulder pain with internal rotation also pain during palpation of and extension of both thumbs and palpation of lateral aspect of both wrists  Recent Results (from the past 2160 hour(s))  POCT HgB A1C     Status: Abnormal  Collection Time: 11/08/15 10:10 AM  Result Value Ref Range   Hemoglobin A1C 6.9      PHQ2/9: Depression screen [facility] 2/9 11/08/2015 06/15/2015 03/08/2015 11/28/2014  Decreased Interest 0 0 0 0  Down, Depressed, Hopeless 0 0 0 0  PHQ - 2 Score 0 0 0 0    Fall Risk: Fall Risk  11/08/2015 06/15/2015 03/08/2015 11/28/2014  Falls in the past year? No No No No      Functional Status Survey: Is the patient deaf or have difficulty hearing?: No Does the patient have difficulty seeing, even when wearing glasses/contacts?: No Does the patient have difficulty concentrating, remembering, or making decisions?: No Does the patient have difficulty walking or climbing stairs?: No Does the patient have difficulty dressing or bathing?: No Does the patient have difficulty doing errands alone such as visiting a doctor's office or shopping?: No   Assessment & Plan  1. 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  Dispense: 90 tablet; Refill: 0  2. Essential (primary) hypertension  - losartan (COZAAR) 25 MG tablet; Take 1 tablet (25 mg total) by mouth daily.  Dispense: 90 tablet; Refill:  1  3. Dyslipidemia  - atorvastatin (LIPITOR) 40 MG tablet; Take 1 tablet (40 mg total) by mouth daily.  Dispense: 90 tablet; Refill: 1  4. Partial epilepsy with impairment of consciousness ([place])  Keep follow up with Dr. Manuella Ghazi  5. Perennial allergic rhinitis with seasonal variation  - montelukast (SINGULAIR) 10 MG tablet; Take 1 tablet (10 mg total) by mouth daily.  Dispense: 90 tablet; Refill: 1 - loratadine (CLARITIN) 10 MG tablet; Take 1 tablet (10 mg total) by mouth 2 (two) times daily.  Dispense: 180 tablet; Refill: 1  6. Chronic constipation  stable  7. Tendinitis of thumb  - Ambulatory referral to Orthopedic Surgery  8. Left shoulder pain  - Ambulatory referral to Orthopedic Surgery  9. Other fatigue  - CBC with Differential/Platelet - Comprehensive metabolic panel - Vitamin 123456 - VITAMIN D 25 Hydroxy (Vit-D Deficiency, Fractures) - TSH  10. Wheezing  - Spirometry: Pre was normal, explained not asthma or COPD. It may be secondary to allergies, no symptoms of CHF and normal exam, we will monitor for now

## 2015-11-09 LAB — CBC WITH DIFFERENTIAL/PLATELET
BASOS: 0 %
Basophils Absolute: 0 10*3/uL (ref 0.0–0.2)
EOS (ABSOLUTE): 0.1 10*3/uL (ref 0.0–0.4)
EOS: 2 %
HEMATOCRIT: 50.5 % (ref 37.5–51.0)
HEMOGLOBIN: 17.2 g/dL (ref 12.6–17.7)
IMMATURE GRANS (ABS): 0 10*3/uL (ref 0.0–0.1)
IMMATURE GRANULOCYTES: 0 %
LYMPHS: 33 %
Lymphocytes Absolute: 1.9 10*3/uL (ref 0.7–3.1)
MCH: 31.3 pg (ref 26.6–33.0)
MCHC: 34.1 g/dL (ref 31.5–35.7)
MCV: 92 fL (ref 79–97)
MONOCYTES: 8 %
Monocytes Absolute: 0.5 10*3/uL (ref 0.1–0.9)
NEUTROS ABS: 3.4 10*3/uL (ref 1.4–7.0)
NEUTROS PCT: 57 %
PLATELETS: 290 10*3/uL (ref 150–379)
RBC: 5.5 x10E6/uL (ref 4.14–5.80)
RDW: 13.2 % (ref 12.3–15.4)
WBC: 5.9 10*3/uL (ref 3.4–10.8)

## 2015-11-09 LAB — COMPREHENSIVE METABOLIC PANEL
A/G RATIO: 1.6 (ref 1.2–2.2)
ALBUMIN: 4.6 g/dL (ref 3.6–4.8)
ALT: 70 IU/L — AB (ref 0–44)
AST: 33 IU/L (ref 0–40)
Alkaline Phosphatase: 79 IU/L (ref 39–117)
BUN / CREAT RATIO: 10 (ref 10–24)
BUN: 9 mg/dL (ref 8–27)
Bilirubin Total: 0.8 mg/dL (ref 0.0–1.2)
CALCIUM: 9.7 mg/dL (ref 8.6–10.2)
CO2: 24 mmol/L (ref 18–29)
Chloride: 102 mmol/L (ref 96–106)
Creatinine, Ser: 0.89 mg/dL (ref 0.76–1.27)
GFR, EST AFRICAN AMERICAN: 106 mL/min/{1.73_m2} (ref >59)
GFR, EST NON AFRICAN AMERICAN: 92 mL/min/{1.73_m2} (ref >59)
GLOBULIN, TOTAL: 2.9 g/dL (ref 1.5–4.5)
Glucose: 111 mg/dL — ABNORMAL HIGH (ref 65–99)
POTASSIUM: 4.9 mmol/L (ref 3.5–5.2)
Sodium: 140 mmol/L (ref 134–144)
TOTAL PROTEIN: 7.5 g/dL (ref 6.0–8.5)

## 2015-11-09 LAB — VITAMIN B12: Vitamin B-12: 1774 pg/mL — ABNORMAL HIGH (ref 211–946)

## 2015-11-09 LAB — VITAMIN D 25 HYDROXY (VIT D DEFICIENCY, FRACTURES): Vit D, 25-Hydroxy: 33.7 ng/mL (ref 30.0–100.0)

## 2015-11-09 LAB — TSH: TSH: 1.1 u[IU]/mL (ref 0.450–4.500)

## 2016-01-09 DIAGNOSIS — M5412 Radiculopathy, cervical region: Secondary | ICD-10-CM | POA: Insufficient documentation

## 2016-01-09 DIAGNOSIS — M5417 Radiculopathy, lumbosacral region: Secondary | ICD-10-CM | POA: Insufficient documentation

## 2016-01-09 DIAGNOSIS — M189 Osteoarthritis of first carpometacarpal joint, unspecified: Secondary | ICD-10-CM | POA: Insufficient documentation

## 2016-01-10 ENCOUNTER — Other Ambulatory Visit: Payer: Self-pay

## 2016-01-10 DIAGNOSIS — E785 Hyperlipidemia, unspecified: Secondary | ICD-10-CM

## 2016-01-10 NOTE — Telephone Encounter (Signed)
Patient requesting refill. 

## 2016-01-11 MED ORDER — ATORVASTATIN CALCIUM 40 MG PO TABS: 40.0000 mg | ORAL_TABLET | Freq: Every day | ORAL | 1 refills | Status: DC

## 2016-02-02 ENCOUNTER — Ambulatory Visit (INDEPENDENT_AMBULATORY_CARE_PROVIDER_SITE_OTHER): Payer: BLUE CROSS/BLUE SHIELD | Admitting: Family Medicine

## 2016-02-02 ENCOUNTER — Encounter: Payer: Self-pay | Admitting: Family Medicine

## 2016-02-02 VITALS — BP 102/68 | HR 99 | Temp 97.5°F | Resp 18 | Ht 66.0 in | Wt 166.4 lb

## 2016-02-02 DIAGNOSIS — J3089 Other allergic rhinitis: Secondary | ICD-10-CM

## 2016-02-02 DIAGNOSIS — F418 Other specified anxiety disorders: Secondary | ICD-10-CM | POA: Diagnosis not present

## 2016-02-02 DIAGNOSIS — J309 Allergic rhinitis, unspecified: Secondary | ICD-10-CM

## 2016-02-02 DIAGNOSIS — I10 Essential (primary) hypertension: Secondary | ICD-10-CM

## 2016-02-02 DIAGNOSIS — E1149 Type 2 diabetes mellitus with other diabetic neurological complication: Secondary | ICD-10-CM | POA: Diagnosis not present

## 2016-02-02 DIAGNOSIS — N521 Erectile dysfunction due to diseases classified elsewhere: Secondary | ICD-10-CM

## 2016-02-02 DIAGNOSIS — M778 Other enthesopathies, not elsewhere classified: Secondary | ICD-10-CM

## 2016-02-02 DIAGNOSIS — M779 Enthesopathy, unspecified: Secondary | ICD-10-CM

## 2016-02-02 DIAGNOSIS — K760 Fatty (change of) liver, not elsewhere classified: Secondary | ICD-10-CM

## 2016-02-02 DIAGNOSIS — Z87898 Personal history of other specified conditions: Secondary | ICD-10-CM

## 2016-02-02 DIAGNOSIS — J302 Other seasonal allergic rhinitis: Secondary | ICD-10-CM

## 2016-02-02 DIAGNOSIS — E785 Hyperlipidemia, unspecified: Secondary | ICD-10-CM

## 2016-02-02 DIAGNOSIS — M6588 Other synovitis and tenosynovitis, other site: Secondary | ICD-10-CM

## 2016-02-02 DIAGNOSIS — E1140 Type 2 diabetes mellitus with diabetic neuropathy, unspecified: Secondary | ICD-10-CM

## 2016-02-02 LAB — HEPATIC FUNCTION PANEL
ALBUMIN: 4.4 g/dL (ref 3.6–5.1)
ALT: 41 U/L (ref 9–46)
AST: 24 U/L (ref 10–35)
Alkaline Phosphatase: 70 U/L (ref 40–115)
BILIRUBIN DIRECT: 0.2 mg/dL (ref <=0.2)
BILIRUBIN TOTAL: 0.8 mg/dL (ref 0.2–1.2)
Indirect Bilirubin: 0.6 mg/dL (ref 0.2–1.2)
Total Protein: 7.1 g/dL (ref 6.1–8.1)

## 2016-02-02 MED ORDER — METFORMIN HCL ER 750 MG PO TB24: 750.0000 mg | ORAL_TABLET | Freq: Every day | ORAL | 0 refills | Status: DC

## 2016-02-02 MED ORDER — LOSARTAN POTASSIUM 25 MG PO TABS: 25.0000 mg | ORAL_TABLET | Freq: Every day | ORAL | 1 refills | Status: DC

## 2016-02-02 MED ORDER — MONTELUKAST SODIUM 10 MG PO TABS: 10.0000 mg | ORAL_TABLET | Freq: Every day | ORAL | 1 refills | Status: DC

## 2016-02-02 MED ORDER — SCOPOLAMINE 1 MG/3DAYS TD PT72: 1.0000 | MEDICATED_PATCH | TRANSDERMAL | 0 refills | Status: DC

## 2016-02-02 NOTE — Patient Instructions (Signed)
Return for blood work only the first week of September I increased dose of Metformin to 750 mg daily , and also advised to stop eating too much rice, and sweets

## 2016-02-02 NOTE — Progress Notes (Signed)
Name: David Skinner   MRN: [id number]    DOB: [birth year]   Date:02/02/2016       Progress Note  Subjective  Chief Complaint  Chief Complaint  Patient presents with  . Diabetes    pt here for 3 month follow up    HPI  HTN: his bp has been towards low end of normal, he is on low dose Losartan,still low and we will stop medication today   Hyperlipidemia: doing well on statin, advised to start taking aspirin 81 mg daily . No chest pain or SOB  AR: he is taking Loratadine and Singulair, no longer coughing or wheezing. He smoked in the past but quit over 10 years ago.   Anxiety/Depression: he stopped taking Lexapro, feeling better and does not want to take medications. No crying spells or suicidal thoughts . No anhedonia  Partial Seizure: he was taking anti seizure medication but it was stopped by Dr. Manuella Ghazi about 3months and no recurrence of episodes of confusion or memory loss.   Diabetes Type 2 diet controlled with ED, hgbA1C has gone up to 6.9% and we started Metformin back in 10/2015 , he denies side effects of medication, he is willing to go up on dose of Metformin. He still eats a lot of carbohydrates. He denies polyphagia or polyuria or polydipsia.  Wrist pain: he works as a Scientist, forensic, and both wrists have been sore for over one year, however the left side is getting worse, saw Ortho and had steroid injection and symptoms improved but still has localized pain on lateral wrist, he should get better since he will go on vacation for one week, he would like to try a brace also.   He states wheezing and fatigue resolved   Patient Active Problem List   Diagnosis Date Noted  . Tinea cruris 03/21/2015  . 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 Depression with anxiety 11/28/2014  . Chronic constipation 11/28/2014  . Insomnia 11/28/2014  . Dyslipidemia 11/28/2014  . Fatty infiltration of liver 11/28/2014  . Essential  (primary) hypertension 11/28/2014  . Gastric reflux 11/28/2014  . Memory loss or impairment 11/28/2014  . Perennial allergic rhinitis with seasonal variation 11/28/2014  . Vitamin D deficiency 11/28/2014  . Tenosynovitis of thumb 11/28/2014  . Partial epilepsy with impairment of consciousness ([place]) 11/16/2014    Past Surgical History:  Procedure Laterality Date  . LASIK Bilateral     Family History  Problem Relation Age of Onset  . Family history unknown: Yes    Social History   Social History  . Marital status: Single    Spouse name: N/A  . Number of children: N/A  . Years of education: N/A   Occupational History  . nail technician    Social History Main Topics  . Smoking status: Former Smoker    Packs/day: 1.00    Years: 15.00    Types: Cigarettes    Start date: 06/17/1984    Quit date: 11/28/1999  . Smokeless tobacco: Not on file  . Alcohol use No  . Drug use: No  . Sexual activity: Yes    Partners: Female   Other Topics Concern  . 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  aspirin EC 81 MG tablet, Take 1 tablet (81 mg total) by mouth daily., Disp: 30 tablet, Rfl: 0 .  atorvastatin (LIPITOR) 40 MG tablet, Take 1 tablet (40 mg total) by mouth daily., Disp: 90 tablet, Rfl: 1 .  fluticasone (FLONASE) 50 MCG/ACT nasal spray, instill 2 sprays into each nostril at bedtime if needed, Disp: 16 g, Rfl: 0 .  ketoconazole (NIZORAL) 2 % cream, Apply 1 application topically 2 (two) times daily., Disp: 60 g, Rfl: 2 .  loratadine (CLARITIN) 10 MG tablet, Take 1 tablet (10 mg total) by mouth 2 (two) times daily., Disp: 180 tablet, Rfl: 1 .  losartan (COZAAR) 25 MG tablet, Take 1 tablet (25 mg total) by mouth daily., Disp: 90 tablet, Rfl: 1 .  metFORMIN (GLUCOPHAGE-XR) 750 MG 24 hr tablet, Take 1 tablet (750 mg total) by mouth daily with breakfast., Disp: 90 tablet, Rfl: 0 .  montelukast  (SINGULAIR) 10 MG tablet, Take 1 tablet (10 mg total) by mouth daily., Disp: 90 tablet, Rfl: 1  Allergies  Allergen Reactions  . Ibuprofen Itching  . Levofloxacin   . Lisinopril Cough  . Metoprolol   . Nsaids Itching  . Tramadol Hcl      ROS  Constitutional: Negative for fever , positive for mild  weight change.  Respiratory: Negative for cough and shortness of breath.   Cardiovascular: Negative for chest pain or palpitations.  Gastrointestinal: Negative for abdominal pain, no bowel changes.  Musculoskeletal: Negative for gait problem or joint swelling.  Skin: Negative for rash.  Neurological: Negative for dizziness or headache.  No other specific complaints in a complete review of systems (except as listed in HPI above).  Objective  Vitals:   02/02/16 1402  BP: 102/68  Pulse: 99  Resp: 18  Temp: 97.5 F (36.4 C)  SpO2: 97%  Weight: 166 lb 6 oz (75.5 kg)  Height: 5\' 6"  (1.676 m)    Body mass index is 26.85 kg/m.  Physical Exam  Constitutional: Patient appears well-developed and well-nourished.  No distress.  HEENT: head atraumatic, normocephalic, pupils equal and reactive to light,  neck supple, throat within normal limits Cardiovascular: Normal rate, regular rhythm and normal heart sounds.  No murmur heard. No BLE edema. Pulmonary/Chest: Effort normal and breath sounds normal. No respiratory distress. Abdominal: Soft.  There is no tenderness. Psychiatric: Patient has a normal mood and affect. behavior is normal. Judgment and thought content normal. Muscular skeletal: pain during flexion of thumb and extension of medial wrist, no swelling  Recent Results (from the past 2160 hour(s))  POCT HgB A1C     Status: Abnormal   Collection Time: 11/08/15 10:10 AM  Result Value Ref Range   Hemoglobin A1C 6.9   CBC with Differential/Platelet     Status: None   Collection Time: 11/08/15 10:48 AM  Result Value Ref Range   WBC 5.9 3.4 - 10.8 x10E3/uL   RBC 5.50 4.14 -  5.80 x10E6/uL   Hemoglobin 17.2 12.6 - 17.7 g/dL   Hematocrit 50.5 37.5 - 51.0 %   MCV 92 79 - 97 fL   MCH 31.3 26.6 - 33.0 pg   MCHC 34.1 31.5 - 35.7 g/dL   RDW 13.2 12.3 - 15.4 %   Platelets 290 150 - 379 x10E3/uL   Neutrophils 57 %   Lymphs 33 %   Monocytes 8 %   Eos 2 %   Basos 0 %   Neutrophils Absolute 3.4 1.4 - 7.0 x10E3/uL   Lymphocytes Absolute 1.9 0.7 - 3.1 x10E3/uL   Monocytes Absolute 0.5 0.1 - 0.9 x10E3/uL   EOS (ABSOLUTE) 0.1 0.0 - 0.4  x10E3/uL   Basophils Absolute 0.0 0.0 - 0.2 x10E3/uL   Immature Granulocytes 0 %   Immature Grans (Abs) 0.0 0.0 - 0.1 x10E3/uL  Comprehensive metabolic panel     Status: Abnormal   Collection Time: 11/08/15 10:48 AM  Result Value Ref Range   Glucose 111 (H) 65 - 99 mg/dL   BUN 9 8 - 27 mg/dL   Creatinine, Ser 0.89 0.76 - 1.27 mg/dL   GFR calc non Af Amer 92 >59 mL/min/1.73   GFR calc Af Amer 106 >59 mL/min/1.73   BUN/Creatinine Ratio 10 10 - 24   Sodium 140 134 - 144 mmol/L   Potassium 4.9 3.5 - 5.2 mmol/L   Chloride 102 96 - 106 mmol/L   CO2 24 18 - 29 mmol/L   Calcium 9.7 8.6 - 10.2 mg/dL   Total Protein 7.5 6.0 - 8.5 g/dL   Albumin 4.6 3.6 - 4.8 g/dL   Globulin, Total 2.9 1.5 - 4.5 g/dL   Albumin/Globulin Ratio 1.6 1.2 - 2.2   Bilirubin Total 0.8 0.0 - 1.2 mg/dL   Alkaline Phosphatase 79 39 - 117 IU/L   AST 33 0 - 40 IU/L   ALT 70 (H) 0 - 44 IU/L  Vitamin B12     Status: Abnormal   Collection Time: 11/08/15 10:48 AM  Result Value Ref Range   Vitamin B-12 1,774 (H) 211 - 946 pg/mL  VITAMIN D 25 Hydroxy (Vit-D Deficiency, Fractures)     Status: None   Collection Time: 11/08/15 10:48 AM  Result Value Ref Range   Vit D, 25-Hydroxy 33.7 30.0 - 100.0 ng/mL    Comment: Vitamin D deficiency has been defined by the Institute of Medicine and an Endocrine Society practice guideline as a level of serum 25-OH vitamin D less than 20 ng/mL (1,2). The Endocrine Society went on to further define vitamin D insufficiency as a level  between 21 and 29 ng/mL (2). 1. IOM (Institute of Medicine). 2010. Dietary reference    intakes for calcium and D. [place]: The    [employer]. 2. Holick MF, Binkley , Bischoff-Ferrari HA, et al.    Evaluation, treatment, and prevention of vitamin D    deficiency: an Endocrine Society clinical practice    guideline. JCEM. 2011 Jul; 96(7):1911-30.   TSH     Status: None   Collection Time: 11/08/15 10:48 AM  Result Value Ref Range   TSH 1.100 0.450 - 4.500 uIU/mL      PHQ2/9: Depression screen [facility] 2/9 02/02/2016 11/08/2015 06/15/2015 03/08/2015 11/28/2014  Decreased Interest 0 0 0 0 0  Down, Depressed, Hopeless 0 0 0 0 0  PHQ - 2 Score 0 0 0 0 0     Fall Risk: Fall Risk  02/02/2016 11/08/2015 06/15/2015 03/08/2015 11/28/2014  Falls in the past year? No No No No No     Functional Status Survey: Is the patient deaf or have difficulty hearing?: No Does the patient have difficulty seeing, even when wearing glasses/contacts?: No Does the patient have difficulty concentrating, remembering, or making decisions?: No Does the patient have difficulty walking or climbing stairs?: No Does the patient have difficulty dressing or bathing?: No Does the patient have difficulty doing errands alone such as visiting a doctor's office or shopping?: No    Assessment & Plan  1. 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  Dispense: 90 tablet; Refill: 0 - Hemoglobin A1c in a few weeks   2. Tendinitis of thumb  - Thumb spica  3. Essential (primary) hypertension  Stop medication, bp is back to normal, lost weight   4. Dyslipidemia  Continue medication   5. Depression with anxiety  In remission   6. Perennial allergic rhinitis with seasonal variation  - montelukast (SINGULAIR) 10 MG tablet; Take 1 tablet (10 mg total) by  mouth daily.  Dispense: 90 tablet; Refill: 1  7. Fatty liver  - Hepatic function panel  8. H/O motion sickness  - scopolamine (TRANSDERM-SCOP, 1.5 MG,) 1 MG/3DAYS; Place 1 patch (1.5 mg total) onto the skin every 3 (three) days. Apply 4 hours before boarding the ship  Dispense: 3 patch; Refill: 0

## 2016-02-03 LAB — HEMOGLOBIN A1C
HEMOGLOBIN A1C: 6.1 % — AB (ref <5.7)
MEAN PLASMA GLUCOSE: 128 mg/dL

## 2016-02-08 ENCOUNTER — Ambulatory Visit: Payer: BLUE CROSS/BLUE SHIELD | Admitting: Family Medicine

## 2016-02-16 ENCOUNTER — Ambulatory Visit: Payer: BLUE CROSS/BLUE SHIELD | Admitting: Family Medicine

## 2016-02-27 ENCOUNTER — Encounter: Payer: Self-pay | Admitting: Emergency Medicine

## 2016-02-27 ENCOUNTER — Emergency Department
Admission: EM | Admit: 2016-02-27 | Discharge: 2016-02-27 | Disposition: A | Discharge status: Home / Self Care | Payer: BLUE CROSS/BLUE SHIELD | Attending: Emergency Medicine | Admitting: Emergency Medicine

## 2016-02-27 DIAGNOSIS — G40909 Epilepsy, unspecified, not intractable, without status epilepticus: Secondary | ICD-10-CM | POA: Insufficient documentation

## 2016-02-27 DIAGNOSIS — E119 Type 2 diabetes mellitus without complications: Secondary | ICD-10-CM | POA: Diagnosis not present

## 2016-02-27 DIAGNOSIS — Z7982 Long term (current) use of aspirin: Secondary | ICD-10-CM | POA: Insufficient documentation

## 2016-02-27 DIAGNOSIS — F05 Delirium due to known physiological condition: Secondary | ICD-10-CM | POA: Insufficient documentation

## 2016-02-27 DIAGNOSIS — Z87891 Personal history of nicotine dependence: Secondary | ICD-10-CM | POA: Diagnosis not present

## 2016-02-27 DIAGNOSIS — Z79899 Other long term (current) drug therapy: Secondary | ICD-10-CM | POA: Diagnosis not present

## 2016-02-27 DIAGNOSIS — R4182 Altered mental status, unspecified: Secondary | ICD-10-CM | POA: Diagnosis present

## 2016-02-27 DIAGNOSIS — I10 Essential (primary) hypertension: Secondary | ICD-10-CM | POA: Insufficient documentation

## 2016-02-27 LAB — URINALYSIS COMPLETE WITH MICROSCOPIC (ARMC ONLY)
BACTERIA UA: NONE SEEN
BILIRUBIN URINE: NEGATIVE
Glucose, UA: NEGATIVE mg/dL
HGB URINE DIPSTICK: NEGATIVE
Ketones, ur: NEGATIVE mg/dL
LEUKOCYTES UA: NEGATIVE
Nitrite: NEGATIVE
PH: 6 (ref 5.0–8.0)
PROTEIN: NEGATIVE mg/dL
RBC / HPF: NONE SEEN RBC/hpf (ref 0–5)
SQUAMOUS EPITHELIAL / LPF: NONE SEEN
Specific Gravity, Urine: 1.003 — ABNORMAL LOW (ref 1.005–1.030)
WBC UA: NONE SEEN WBC/hpf (ref 0–5)

## 2016-02-27 LAB — CBC
HCT: 47.8 % (ref 40.0–52.0)
HEMOGLOBIN: 16.5 g/dL (ref 13.0–18.0)
MCH: 31.8 pg (ref 26.0–34.0)
MCHC: 34.6 g/dL (ref 32.0–36.0)
MCV: 92 fL (ref 80.0–100.0)
PLATELETS: 229 10*3/uL (ref 150–440)
RBC: 5.2 MIL/uL (ref 4.40–5.90)
RDW: 13.9 % (ref 11.5–14.5)
WBC: 7.6 10*3/uL (ref 3.8–10.6)

## 2016-02-27 LAB — COMPREHENSIVE METABOLIC PANEL
ALT: 36 U/L (ref 17–63)
AST: 30 U/L (ref 15–41)
Albumin: 4.1 g/dL (ref 3.5–5.0)
Alkaline Phosphatase: 66 U/L (ref 38–126)
Anion gap: 7 (ref 5–15)
BILIRUBIN TOTAL: 0.8 mg/dL (ref 0.3–1.2)
BUN: 14 mg/dL (ref 6–20)
CO2: 22 mmol/L (ref 22–32)
Calcium: 8.9 mg/dL (ref 8.9–10.3)
Chloride: 107 mmol/L (ref 101–111)
Creatinine, Ser: 0.82 mg/dL (ref 0.61–1.24)
GFR calc Af Amer: >60 mL/min (ref >60)
Glucose, Bld: 102 mg/dL — ABNORMAL HIGH (ref 65–99)
POTASSIUM: 4 mmol/L (ref 3.5–5.1)
Sodium: 136 mmol/L (ref 135–145)
TOTAL PROTEIN: 7.1 g/dL (ref 6.5–8.1)

## 2016-02-27 LAB — ETHANOL

## 2016-02-27 MED ORDER — LEVETIRACETAM 250 MG PO TABS: 250.0000 mg | ORAL_TABLET | Freq: Two times a day (BID) | ORAL | 0 refills | Status: DC

## 2016-02-27 MED ORDER — ACETAMINOPHEN 325 MG PO TABS
650.0000 mg | ORAL_TABLET | Freq: Once | ORAL | Status: AC
  Administered 2016-02-27: 650 mg via ORAL
  Filled 2016-02-27: qty 2

## 2016-02-27 NOTE — Discharge Instructions (Signed)
Please restart your Keppra medication today.  Please make an appointment with Dr. Manuella Ghazi for follow-up in the next several days.  Return to the emergency department if you develop seizures, fever, changes in mental status, or any other symptoms concerning to you.

## 2016-02-27 NOTE — ED Notes (Signed)
Awaiting d/c instructions

## 2016-02-27 NOTE — ED Provider Notes (Signed)
[facility] Emergency Department Provider Note  ____________________________________________  Time seen: Approximately 8:18 AM  I have reviewed the triage vital signs and the nursing notes.   HISTORY  Chief Complaint Altered Mental Status    HPI David Skinner is a 65 y.o. male with known seizure disorder off of antiepileptics presenting with postictal state. Traditionally, the patient has had seizures in his sleep, and wakes up with a postictal state of confusion. He has not had a seizure in more than 18 months, and his Keppra was discontinued in March. This morning, his wife states that he woke up and had confusion typical of his previous seizures. He was asking the same questions over and over and could not remember events over the last couple of days. There is no history of recent illness, no trauma. No tick bites or headache, no fever or chills. No urinary or fecal incontinence.   Past Medical History:  Diagnosis Date  . Allergy   . Anxiety   . Atopy   . Chronic constipation   . Elevated LFTs   . Fatty liver   . GERD (gastroesophageal reflux disease)   . Hyperlipidemia   . Hypertension   . Hypoglycemia   . Insomnia   . Memory change   . Overweight   . Paresthesia of hand   . Seizure ([place])   . Tenosynovitis of finger   . Tinea cruris   . Vitamin D deficiency     Patient Active Problem List   Diagnosis Date Noted  . Tinea cruris 03/21/2015  . 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 Depression with anxiety 11/28/2014  . Chronic constipation 11/28/2014  . Insomnia 11/28/2014  . Dyslipidemia 11/28/2014  . Fatty infiltration of liver 11/28/2014  . Essential (primary) hypertension 11/28/2014  . Gastric reflux 11/28/2014  . Memory loss or impairment 11/28/2014  . Perennial allergic rhinitis with seasonal variation 11/28/2014  . Vitamin D deficiency 11/28/2014  . Tenosynovitis of thumb  11/28/2014  . Partial epilepsy with impairment of consciousness ([place]) 11/16/2014    Past Surgical History:  Procedure Laterality Date  . LASIK Bilateral     Current Outpatient Rx  . Order #: [id number] Class: OTC  . Order #: [id number] Class: Normal  . Order #: [id number] Class: Normal  . Order #: [id number] Class: Normal  . Order #: [id number] Class: Print  . Order #: [id number] Class: Normal  . Order #: [id number] Class: Normal  . Order #: [id number] Class: Normal  . Order #: [id number] Class: Normal    Allergies Ibuprofen; Levofloxacin; Lisinopril; Metoprolol; Nsaids; and Tramadol hcl  Family History  Problem Relation Age of Onset  . Family history unknown: Yes    Social History Social History  Substance Use Topics  . Smoking status: Former Smoker    Packs/day: 1.00    Years: 15.00    Types: Cigarettes    Start date: 06/17/1984    Quit date: 11/28/1999  . Smokeless tobacco: Never Used  . Alcohol use No    Review of Systems Constitutional: No fever/chills.No lightheadedness or syncope. No trauma. Eyes: No visual changes. No blurred or double vision. ENT: No sore throat. No congestion or rhinorrhea. Cardiovascular: Denies chest pain. Denies palpitations. Respiratory: Denies shortness of breath.  No cough. Gastrointestinal: No abdominal pain.  No nausea, no vomiting.  No diarrhea.  No constipation. Genitourinary: Negative for dysuria. Musculoskeletal: Negative for back pain. Skin: Negative for rash. Neurological: Negative for headaches. No focal numbness, tingling or weakness. Positive postictal state  that may be indicative of typical seizure.  10-point ROS otherwise negative.  ____________________________________________   PHYSICAL EXAM:  VITAL SIGNS: ED Triage Vitals  Enc Vitals Group     BP 02/27/16 0744 (!) 162/101     Pulse Rate 02/27/16 0744 71     Resp 02/27/16 0744 20     Temp 02/27/16 0744 97.6 F (36.4 C)     Temp Source 02/27/16 0744 Oral     SpO2 02/27/16  0744 97 %     Weight 02/27/16 0744 165 lb (74.8 kg)     Height 02/27/16 0744 5\' 5"  (1.651 m)     Head Circumference --      Peak Flow --      Pain Score 02/27/16 0745 4     Pain Loc --      Pain Edu? --      Excl. in [place]? --     Constitutional: Alert and oriented. Well appearing and in no acute distress. Answers questions appropriately. Eyes: Conjunctivae are normal.  EOMI. PERRLA.. No scleral icterus. Head: Atraumatic. Nose: No congestion/rhinnorhea. Mouth/Throat: Mucous membranes are moist.  Neck: No stridor.  Supple.  No meningismus.  No JVD. Cardiovascular: Normal rate, regular rhythm. No murmurs, rubs or gallops.  Respiratory: Normal respiratory effort.  No accessory muscle use or retractions. Lungs CTAB.  No wheezes, rales or ronchi. Gastrointestinal: Soft, nontender and nondistended.  No guarding or rebound.  No peritoneal signs. Musculoskeletal: No LE edema.  Neurologic: Alert and oriented 3. Speech is clear.  Face and smile symmetric. EOMI. PERRLA. No nystagmus horizontally or vertically. No pronator drift. 5 out of 5 grip, biceps, triceps, hip flexors, plantar flexion and dorsiflexion. Normal sensation to light touch in the bilateral upper and lower extremities, and face. Normal gait without ataxia  Skin:  Skin is warm, dry and intact. No rash noted. Psychiatric: Mood and affect are normal. Speech and behavior are normal.  Normal judgement.  ____________________________________________   LABS (all labs ordered are listed, but only abnormal results are displayed)  Labs Reviewed  URINALYSIS COMPLETEWITH MICROSCOPIC ([place]) - Abnormal; Notable for the following:       Result Value   Color, Urine COLORLESS (*)    APPearance CLEAR (*)    Specific Gravity, Urine 1.003 (*)    All other components within normal limits  COMPREHENSIVE METABOLIC PANEL - Abnormal; Notable for the following:    Glucose, Bld 102 (*)    All other components within normal limits  CBC  ETHANOL    ____________________________________________  EKG  ED ECG REPORT I, Eula Listen, the attending physician, personally viewed and interpreted this ECG.   Date: 02/27/2016  EKG Time: 911  Rate: 77  Rhythm: normal sinus rhythm  Axis: normal  Intervals:none  ST&T Change: Nonspecific T-wave inversion in V1. No ST elevation.  ____________________________________________  RADIOLOGY  No results found.  ____________________________________________   PROCEDURES  Procedure(s) performed: None  Procedures  Critical Care performed: No ____________________________________________   INITIAL IMPRESSION / ASSESSMENT AND PLAN / ED COURSE  Pertinent labs & imaging results that were available during my care of the patient were reviewed by me and considered in my medical decision making (see chart for details).  65 y.o. male with known seizure disorder off his antiepileptics presenting with likely postictal state. Overall, the patient is neurologically intact and hemodynamic stable. We'll check basic labs  I have spoken with the patient's neurologist, Dr. Manuella Ghazi, who reports known seizure disorder. He states that in  the past, there have sometimes been alcohol-related seizures, and requests an alcohol level. Dr. Marguarite Arbour both agree that the patient can be restarted on his Keppra if his workup in the emergency department is reassuring, and follow up as an outpatient.   ----------------------------------------- 9:49 AM on 02/27/2016 -----------------------------------------  The patient has remained hemodynamically stable throughout the entirety of his emergency department stay. His laboratory studies are reassuring with no electrolyte abnormalities and an alcohol level of less than 5. At this time, the patient is ready for discharge, however, his wife states that his mental status is not back to normal. He is alert and oriented 3, speech is clear, and he is able to interact  normally. However, she states that while he is improving, that he does not remember things like what he did yesterday. Upon questioning, the patient does not remember what he had for dinner last night. We'll continue to monitor the patient for another hour. If he does not regain normal mental status, we will initiate imaging and possible admission. ____________________________________________  FINAL CLINICAL IMPRESSION(S) / ED DIAGNOSES  Final diagnoses:  Confusion after a seizure    Clinical Course      NEW MEDICATIONS STARTED DURING THIS VISIT:  New Prescriptions   LEVETIRACETAM (KEPPRA) 250 MG TABLET    Take 1 tablet (250 mg total) by mouth 2 (two) times daily.      Eula Listen, MD 02/27/16 [phone]

## 2016-02-27 NOTE — ED Triage Notes (Signed)
Per wife pt did not remember that he had to pick someone else for work this am. The last time this happened, pt had seizure. Pt not currently taking his seizure meds and has been off for about one year. Last time an episode happened like this, he had a seizure. Pt with nad distress note.d

## 2016-03-21 ENCOUNTER — Other Ambulatory Visit: Payer: Self-pay | Admitting: Family Medicine

## 2016-03-21 DIAGNOSIS — E1140 Type 2 diabetes mellitus with diabetic neuropathy, unspecified: Secondary | ICD-10-CM

## 2016-03-21 DIAGNOSIS — N521 Erectile dysfunction due to diseases classified elsewhere: Secondary | ICD-10-CM

## 2016-03-21 NOTE — Telephone Encounter (Signed)
Patient requesting refill of Metformin to [facility]. 

## 2016-05-05 ENCOUNTER — Other Ambulatory Visit: Payer: Self-pay | Admitting: Family Medicine

## 2016-05-05 DIAGNOSIS — N521 Erectile dysfunction due to diseases classified elsewhere: Secondary | ICD-10-CM

## 2016-05-05 DIAGNOSIS — E1140 Type 2 diabetes mellitus with diabetic neuropathy, unspecified: Secondary | ICD-10-CM

## 2016-05-06 NOTE — Telephone Encounter (Signed)
Patient requesting refill of Metformin to [facility]. 

## 2016-05-28 ENCOUNTER — Ambulatory Visit (INDEPENDENT_AMBULATORY_CARE_PROVIDER_SITE_OTHER): Payer: BLUE CROSS/BLUE SHIELD | Admitting: Family Medicine

## 2016-05-28 ENCOUNTER — Encounter: Payer: Self-pay | Admitting: Family Medicine

## 2016-05-28 VITALS — BP 118/62 | HR 78 | Temp 97.7°F | Resp 18 | Ht 65.0 in | Wt 162.0 lb

## 2016-05-28 DIAGNOSIS — N521 Erectile dysfunction due to diseases classified elsewhere: Secondary | ICD-10-CM | POA: Diagnosis not present

## 2016-05-28 DIAGNOSIS — M778 Other enthesopathies, not elsewhere classified: Secondary | ICD-10-CM | POA: Diagnosis not present

## 2016-05-28 DIAGNOSIS — G40209 Localization-related (focal) (partial) symptomatic epilepsy and epileptic syndromes with complex partial seizures, not intractable, without status epilepticus: Secondary | ICD-10-CM

## 2016-05-28 DIAGNOSIS — E1149 Type 2 diabetes mellitus with other diabetic neurological complication: Secondary | ICD-10-CM | POA: Diagnosis not present

## 2016-05-28 DIAGNOSIS — M2351 Chronic instability of knee, right knee: Secondary | ICD-10-CM

## 2016-05-28 DIAGNOSIS — E1140 Type 2 diabetes mellitus with diabetic neuropathy, unspecified: Secondary | ICD-10-CM

## 2016-05-28 DIAGNOSIS — I10 Essential (primary) hypertension: Secondary | ICD-10-CM

## 2016-05-28 DIAGNOSIS — J302 Other seasonal allergic rhinitis: Secondary | ICD-10-CM

## 2016-05-28 DIAGNOSIS — Z23 Encounter for immunization: Secondary | ICD-10-CM | POA: Diagnosis not present

## 2016-05-28 DIAGNOSIS — J3089 Other allergic rhinitis: Secondary | ICD-10-CM | POA: Diagnosis not present

## 2016-05-28 DIAGNOSIS — E785 Hyperlipidemia, unspecified: Secondary | ICD-10-CM | POA: Diagnosis not present

## 2016-05-28 DIAGNOSIS — K760 Fatty (change of) liver, not elsewhere classified: Secondary | ICD-10-CM

## 2016-05-28 DIAGNOSIS — M779 Enthesopathy, unspecified: Secondary | ICD-10-CM

## 2016-05-28 LAB — CBC WITH DIFFERENTIAL/PLATELET
BASOS PCT: 0 %
Basophils Absolute: 0 cells/uL (ref 0–200)
EOS ABS: 168 {cells}/uL (ref 15–500)
EOS PCT: 3 %
HCT: 49.4 % (ref 38.5–50.0)
Hemoglobin: 16.5 g/dL (ref 13.2–17.1)
Lymphocytes Relative: 34 %
Lymphs Abs: 1904 cells/uL (ref 850–3900)
MCH: 30.7 pg (ref 27.0–33.0)
MCHC: 33.4 g/dL (ref 32.0–36.0)
MCV: 92 fL (ref 80.0–100.0)
MONOS PCT: 11 %
MPV: 9.5 fL (ref 7.5–12.5)
Monocytes Absolute: 616 cells/uL (ref 200–950)
NEUTROS ABS: 2912 {cells}/uL (ref 1500–7800)
Neutrophils Relative %: 52 %
PLATELETS: 249 10*3/uL (ref 140–400)
RBC: 5.37 MIL/uL (ref 4.20–5.80)
RDW: 14.1 % (ref 11.0–15.0)
WBC: 5.6 10*3/uL (ref 3.8–10.8)

## 2016-05-28 LAB — POCT GLYCOSYLATED HEMOGLOBIN (HGB A1C): HEMOGLOBIN A1C: 6.1

## 2016-05-28 LAB — POCT UA - MICROALBUMIN: Microalbumin Ur, POC: NEGATIVE mg/L

## 2016-05-28 MED ORDER — MONTELUKAST SODIUM 10 MG PO TABS: 10.0000 mg | ORAL_TABLET | Freq: Every day | ORAL | 1 refills | Status: DC

## 2016-05-28 MED ORDER — METFORMIN HCL ER 750 MG PO TB24: ORAL_TABLET | ORAL | 0 refills | Status: DC

## 2016-05-28 MED ORDER — ATORVASTATIN CALCIUM 40 MG PO TABS: 40.0000 mg | ORAL_TABLET | Freq: Every day | ORAL | 1 refills | Status: DC

## 2016-05-28 MED ORDER — LORATADINE 10 MG PO TABS: 10.0000 mg | ORAL_TABLET | Freq: Two times a day (BID) | ORAL | 1 refills | Status: DC

## 2016-05-28 MED ORDER — FLUTICASONE PROPIONATE 50 MCG/ACT NA SUSP: NASAL | 1 refills | Status: DC

## 2016-05-28 NOTE — Progress Notes (Signed)
Name: David Skinner   MRN: [id number]    DOB: 1950/07/21   Date:05/28/2016       Progress Note  Subjective  Chief Complaint  Chief Complaint  Patient presents with  . Medication Refill    4 month F/U  . Diabetes    Does not check sugar at home  . Hypertension    Headaches and Fatigue  . Hyperlipidemia  . Allergic Rhinitis     Patient taking medication daily and still having symptoms of congestion and sneezing.   . Wrist Pain    Patient states he only had 1 injection and his wrist are doing better    HPI  HTN: his bp has been towards low end of normal, he is on low dose Losartan, urine micro negative. He denies orthostatic changes  Hyperlipidemia: doing well on statin and also aspirin 81 mg daily . No chest pain or SOB  AR: he is taking Loratadine and Singulair, no longer coughing or wheezing. He smoked in the past but quit over 10 years ago. Symptoms under control now, worse in the Spring  Partial Seizure: he was taking anti seizure medication but it was stopped by Dr. Manuella Ghazi about 18  months and no recurrence of episodes of confusion or memory loss. Last episode 02/2016 and went to [facility], he had been off [place] and it was resumed at the time  Diabetes Type 2 diet controlled with ED, hgbA1C had gone up to 6.9% and we started Metformin back in 10/2015 , he denies side effects of medication, his glucose is at goal with new dose of Metformin. He has polyphagia but denies  polydipsia or polyuria. Explained he is due for eye exam  Wrist pain: he works as a Scientist, forensic, and both wrists have been sore for over one year, right worse than left, had steroid injection and is doing well, still taking Meloxicam and discussed long term risk of medication  Right knee instability: he states he has episodes of numbness on lateral aspect of right knee, and occasionally right knee instability when walking on treadmill, no falls related to it, denies knee pain or effusion. No back pain. Advised to  follow up with Dr. Sabra Heck  Patient Active Problem List   Diagnosis Date Noted  . Tinea cruris 03/21/2015  . 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 Depression with anxiety 11/28/2014  . Chronic constipation 11/28/2014  . Insomnia 11/28/2014  . Dyslipidemia 11/28/2014  . Fatty infiltration of liver 11/28/2014  . Essential (primary) hypertension 11/28/2014  . Gastric reflux 11/28/2014  . Memory loss or impairment 11/28/2014  . Perennial allergic rhinitis with seasonal variation 11/28/2014  . Vitamin D deficiency 11/28/2014  . Tenosynovitis of thumb 11/28/2014  . Partial epilepsy with impairment of consciousness ([place]) 11/16/2014    Past Surgical History:  Procedure Laterality Date  . LASIK Bilateral     Family History  Problem Relation Age of Onset  . Family history unknown: Yes    Social History   Social History  . Marital status: Single    Spouse name: N/A  . Number of children: N/A  . Years of education: N/A   Occupational History  . nail technician    Social History Main Topics  . Smoking status: Former Smoker    Packs/day: 1.00    Years: 15.00    Types: Cigarettes    Start date: 06/17/1984    Quit date: 11/28/1999  . Smokeless tobacco: Never Used  .  Alcohol use No  . Drug use: No  . Sexual activity: Yes    Partners: Female   Other Topics Concern  . 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  aspirin EC 81 MG tablet, Take 1 tablet (81 mg total) by mouth daily., Disp: 30 tablet, Rfl: 0 .  atorvastatin (LIPITOR) 40 MG tablet, Take 1 tablet (40 mg total) by mouth daily., Disp: 90 tablet, Rfl: 1 .  fluticasone (FLONASE) 50 MCG/ACT nasal spray, instill 2 sprays into each nostril at bedtime if needed, Disp: 16 g, Rfl: 1 .  ketoconazole (NIZORAL) 2 % cream, Apply 1 application topically 2 (two) times daily., Disp: 60 g, Rfl: 2 .   levETIRAcetam (KEPPRA) 250 MG tablet, Take 1 tablet (250 mg total) by mouth 2 (two) times daily., Disp: 60 tablet, Rfl: 0 .  loratadine (CLARITIN) 10 MG tablet, Take 1 tablet (10 mg total) by mouth 2 (two) times daily., Disp: 180 tablet, Rfl: 1 .  meloxicam (MOBIC) 15 MG tablet, Take 1 tablet by mouth daily., Disp: , Rfl: 0 .  metFORMIN (GLUCOPHAGE-XR) 750 MG 24 hr tablet, take 1 tablet by mouth once daily WITH BREAKFAST, Disp: 90 tablet, Rfl: 0 .  montelukast (SINGULAIR) 10 MG tablet, Take 1 tablet (10 mg total) by mouth daily., Disp: 90 tablet, Rfl: 1  Allergies  Allergen Reactions  . Ibuprofen Itching  . Levofloxacin   . Lisinopril Cough  . Metoprolol   . Nsaids Itching  . Tramadol Hcl      ROS  Constitutional: Negative for fever or weight change.  Respiratory: Negative for cough and shortness of breath.   Cardiovascular: Negative for chest pain or palpitations.  Gastrointestinal: Negative for abdominal pain, no bowel changes.  Musculoskeletal: Negative for gait problem or joint swelling.  Skin: Negative for rash.  Neurological: Negative for dizziness or headache.  No other specific complaints in a complete review of systems (except as listed in HPI above).   Objective  Vitals:   05/28/16 1004  BP: 118/62  Pulse: 78  Resp: 18  Temp: 97.7 F (36.5 C)  TempSrc: Oral  SpO2: 97%  Weight: 162 lb (73.5 kg)  Height: 5\' 5"  (1.651 m)    Body mass index is 26.96 kg/m.  Physical Exam  Constitutional: Patient appears well-developed and well-nourished. No distress.  HEENT: head atraumatic, normocephalic, pupils equal and reactive to light,neck supple, throat within normal limits Cardiovascular: Normal rate, regular rhythm and normal heart sounds.  No murmur heard. No BLE edema. Pulmonary/Chest: Effort normal and breath sounds normal. No respiratory distress. Abdominal: Soft.  There is no tenderness. Psychiatric: Patient has a normal mood and affect. behavior is normal.  Judgment and thought content normal. Muscular Skeletal: normal right knee exam, negative straight leg raise  Recent Results (from the past 2160 hour(s))  POCT HgB A1C     Status: Normal   Collection Time: 05/28/16 10:09 AM  Result Value Ref Range   Hemoglobin A1C 6.1   POCT UA - Microalbumin     Status: Normal   Collection Time: 05/28/16 10:10 AM  Result Value Ref Range   Microalbumin Ur, POC negative mg/L   Creatinine, POC  mg/dL   Albumin/Creatinine Ratio, Urine, POC      Diabetic Foot Exam: Diabetic Foot Exam - Simple   Simple Foot Form Diabetic Foot exam was performed with the following findings:  Yes  05/28/2016 10:26 AM  Visual Inspection No deformities, no ulcerations, no other skin breakdown bilaterally:  Yes Sensation Testing Intact to touch and monofilament testing bilaterally:  Yes Pulse Check Posterior Tibialis and Dorsalis pulse intact bilaterally:  Yes Comments     PHQ2/9: Depression screen [facility] 2/9 05/28/2016 02/02/2016 11/08/2015 06/15/2015 03/08/2015  Decreased Interest 0 0 0 0 0  Down, Depressed, Hopeless 0 0 0 0 0  PHQ - 2 Score 0 0 0 0 0     Fall Risk: Fall Risk  05/28/2016 02/02/2016 11/08/2015 06/15/2015 03/08/2015  Falls in the past year? Yes No No No No  Number falls in past yr: 1 - - - -  Injury with Fall? No - - - -     Functional Status Survey: Is the patient deaf or have difficulty hearing?: No Does the patient have difficulty seeing, even when wearing glasses/contacts?: No Does the patient have difficulty concentrating, remembering, or making decisions?: No Does the patient have difficulty walking or climbing stairs?: No Does the patient have difficulty dressing or bathing?: No Does the patient have difficulty doing errands alone such as visiting a doctor's office or shopping?: No    Assessment & Plan  1. 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 Perennial allergic rhinitis with seasonal variation  - montelukast (SINGULAIR) 10 MG tablet; Take 1 tablet (10 mg total) by mouth daily.  Dispense: 90 tablet; Refill: 1 - fluticasone (FLONASE) 50 MCG/ACT nasal spray; instill 2 sprays into each nostril at bedtime if needed  Dispense: 16 g; Refill: 1 - loratadine (CLARITIN) 10 MG tablet; Take 1 tablet (10 mg total) by mouth 2 (two) times daily.  Dispense: 180 tablet; Refill: 1  3. Dyslipidemia  - atorvastatin (LIPITOR) 40 MG tablet; Take 1 tablet (40 mg total) by mouth daily.  Dispense: 90 tablet; Refill: 1 - Lipid panel  4. Essential (primary) hypertension  Well controlled - COMPLETE METABOLIC PANEL WITH GFR - CBC with Differential/Platelet  5. Tendinitis of thumb  Seeing Dr. Sabra Heck and had steroid injection, taking Meloxicam - no side effects, pain is better controlled  6. Partial epilepsy with impairment of consciousness ([place])  Continue follow up with Dr. Manuella Ghazi  7. Fatty liver  Recheck labs  - COMPLETE METABOLIC PANEL WITH GFR  8. Recurrent right knee instability  Advised him to discuss with Dr. Sabra Heck during his next visit

## 2016-05-28 NOTE — Addendum Note (Signed)
Addended by: Lolita Rieger D on: 05/28/2016 11:06 AM   Modules accepted: Orders

## 2016-05-29 LAB — COMPLETE METABOLIC PANEL WITH GFR
ALK PHOS: 67 U/L (ref 40–115)
ALT: 49 U/L — ABNORMAL HIGH (ref 9–46)
AST: 26 U/L (ref 10–35)
Albumin: 4.6 g/dL (ref 3.6–5.1)
BILIRUBIN TOTAL: 1.2 mg/dL (ref 0.2–1.2)
BUN: 18 mg/dL (ref 7–25)
CO2: 25 mmol/L (ref 20–31)
Calcium: 9.5 mg/dL (ref 8.6–10.3)
Chloride: 104 mmol/L (ref 98–110)
Creat: 0.97 mg/dL (ref 0.70–1.25)
GFR, EST NON AFRICAN AMERICAN: 83 mL/min (ref >=60)
Glucose, Bld: 94 mg/dL (ref 65–99)
Potassium: 4.8 mmol/L (ref 3.5–5.3)
Sodium: 140 mmol/L (ref 135–146)
TOTAL PROTEIN: 7.2 g/dL (ref 6.1–8.1)

## 2016-05-29 LAB — LIPID PANEL
CHOL/HDL RATIO: 2.8 ratio (ref <5.0)
CHOLESTEROL: 122 mg/dL (ref <200)
HDL: 44 mg/dL (ref >40)
LDL Cholesterol: 61 mg/dL (ref <100)
Triglycerides: 84 mg/dL (ref <150)
VLDL: 17 mg/dL (ref <30)

## 2016-05-31 ENCOUNTER — Ambulatory Visit: Payer: BLUE CROSS/BLUE SHIELD | Admitting: Family Medicine

## 2016-07-22 DIAGNOSIS — F1021 Alcohol dependence, in remission: Secondary | ICD-10-CM | POA: Insufficient documentation

## 2016-07-22 DIAGNOSIS — F1020 Alcohol dependence, uncomplicated: Secondary | ICD-10-CM | POA: Insufficient documentation

## 2016-08-26 LAB — HM DIABETES EYE EXAM

## 2016-08-27 ENCOUNTER — Ambulatory Visit: Payer: BLUE CROSS/BLUE SHIELD | Admitting: Family Medicine

## 2016-08-29 ENCOUNTER — Encounter: Payer: Self-pay | Admitting: Family Medicine

## 2016-08-30 ENCOUNTER — Encounter: Payer: Self-pay | Admitting: Family Medicine

## 2016-10-04 ENCOUNTER — Encounter: Payer: Self-pay | Admitting: Family Medicine

## 2016-10-04 ENCOUNTER — Ambulatory Visit (INDEPENDENT_AMBULATORY_CARE_PROVIDER_SITE_OTHER): Payer: BLUE CROSS/BLUE SHIELD | Admitting: Family Medicine

## 2016-10-04 DIAGNOSIS — K760 Fatty (change of) liver, not elsewhere classified: Secondary | ICD-10-CM

## 2016-10-04 DIAGNOSIS — N521 Erectile dysfunction due to diseases classified elsewhere: Secondary | ICD-10-CM | POA: Diagnosis not present

## 2016-10-04 DIAGNOSIS — J302 Other seasonal allergic rhinitis: Secondary | ICD-10-CM

## 2016-10-04 DIAGNOSIS — F321 Major depressive disorder, single episode, moderate: Secondary | ICD-10-CM

## 2016-10-04 DIAGNOSIS — G4709 Other insomnia: Secondary | ICD-10-CM | POA: Diagnosis not present

## 2016-10-04 DIAGNOSIS — M542 Cervicalgia: Secondary | ICD-10-CM | POA: Diagnosis not present

## 2016-10-04 DIAGNOSIS — E785 Hyperlipidemia, unspecified: Secondary | ICD-10-CM

## 2016-10-04 DIAGNOSIS — E1149 Type 2 diabetes mellitus with other diabetic neurological complication: Secondary | ICD-10-CM | POA: Diagnosis not present

## 2016-10-04 DIAGNOSIS — F1021 Alcohol dependence, in remission: Secondary | ICD-10-CM | POA: Diagnosis not present

## 2016-10-04 DIAGNOSIS — G40209 Localization-related (focal) (partial) symptomatic epilepsy and epileptic syndromes with complex partial seizures, not intractable, without status epilepticus: Secondary | ICD-10-CM

## 2016-10-04 DIAGNOSIS — I10 Essential (primary) hypertension: Secondary | ICD-10-CM | POA: Diagnosis not present

## 2016-10-04 DIAGNOSIS — J3089 Other allergic rhinitis: Secondary | ICD-10-CM | POA: Diagnosis not present

## 2016-10-04 DIAGNOSIS — E559 Vitamin D deficiency, unspecified: Secondary | ICD-10-CM

## 2016-10-04 DIAGNOSIS — R5383 Other fatigue: Secondary | ICD-10-CM

## 2016-10-04 DIAGNOSIS — E1140 Type 2 diabetes mellitus with diabetic neuropathy, unspecified: Secondary | ICD-10-CM

## 2016-10-04 LAB — POCT GLYCOSYLATED HEMOGLOBIN (HGB A1C): Hemoglobin A1C: 6.1

## 2016-10-04 MED ORDER — DULOXETINE HCL 30 MG PO CPEP: 30.0000 mg | ORAL_CAPSULE | Freq: Every day | ORAL | 0 refills | Status: DC

## 2016-10-04 MED ORDER — TRAZODONE HCL 100 MG PO TABS: 100.0000 mg | ORAL_TABLET | Freq: Every day | ORAL | 0 refills | Status: DC

## 2016-10-04 MED ORDER — METFORMIN HCL ER 750 MG PO TB24: ORAL_TABLET | ORAL | 1 refills | Status: DC

## 2016-10-04 MED ORDER — BACLOFEN 20 MG PO TABS: 20.0000 mg | ORAL_TABLET | Freq: Three times a day (TID) | ORAL | 0 refills | Status: DC

## 2016-10-04 NOTE — Progress Notes (Signed)
Name: David Skinner   MRN: [id number]    DOB: 1951/01/13   Date:10/04/2016       Progress Note  Subjective  Chief Complaint  Chief Complaint  Patient presents with  . Diabetes    3 month follow up  . Hyperlipidemia  . Fatigue    pt stated that he has felt very tired and has had a pain from back of his neck down to his back for 10 days                                                                            HPI  He is here today with a Optometrist.   HTN: his bp has been towards low end of normal, he is on low dose Losartan, urine micro negative. He denies orthostatic changes, no chest pain or palpitation  Major depression: he had an episode in the past about 5 years ago, but over the past few months he has been feeling down, but much worse over the past 10 days, fatigue, lack of motivation, no energy, difficulty staying asleep and decrease in appetite. Discussed options and he is willing to start medication today.  Alcoholism: he used to drink heavy while in [place] used to drink liquor at least 3 times a week and used to get drunk, when he moved to [place] ( 1979)  he started to drink beer, mostly on weekends ,but quit  Completely about 6 months ago.   Hyperlipidemia: doing well on statin and also aspirin 81 mg daily . No chest pain or SOB  AR: he is taking Loratadine and Singulair, no longer coughing or wheezing. He smoked in the past but quit over 10 years ago. Symptoms under control now, worse in the Spring  Partial Seizure: he was taking anti seizure medication but it was stopped by Dr. Manuella Ghazi. . Last episode 02/2016 and went to [facility], he had been off [place] and it was resumed at the time, he is still on medication, seeing Dr. Manuella Ghazi as recommended. He was advised to have sleep study done by Dr. Manuella Ghazi.  Diabetes Type 2 diet controlled with ED, hgbA1C had gone up to 6.9% and we started Metformin back in 10/2015 , he denies side effects of medication.He has episodes of polyphagia but  denies  polydipsia or polyuria. Explained he is due for eye exam  Neck pain: he works at a salon, having neck pain over the past 2 weeks, it is described as aching and stiff  but does not affect his rom. Discussed exercises, massage and short course of muscle relaxer - discussed possible side effects of medication  Patient Active Problem List   Diagnosis Date Noted  . History of alcoholism ([place]) 07/22/2016  . Tinea cruris 03/21/2015  . 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 Depression with anxiety 11/28/2014  . Chronic constipation 11/28/2014  . Insomnia 11/28/2014  . Dyslipidemia 11/28/2014  . Fatty infiltration of liver 11/28/2014  . Essential (primary) hypertension 11/28/2014  . Gastric reflux 11/28/2014  . Memory loss or impairment 11/28/2014  . Perennial allergic rhinitis with seasonal variation 11/28/2014  . Vitamin D deficiency 11/28/2014  . Tenosynovitis of thumb 11/28/2014  .  Partial epilepsy with impairment of consciousness ([place]) 11/16/2014    Past Surgical History:  Procedure Laterality Date  . LASIK Bilateral     Family History  Problem Relation Age of Onset  . Family history unknown: Yes    Social History   Social History  . Marital status: Single    Spouse name: N/A  . Number of children: N/A  . Years of education: N/A   Occupational History  . nail technician    Social History Main Topics  . Smoking status: Former Smoker    Packs/day: 1.00    Years: 15.00    Types: Cigarettes    Start date: 06/17/1984    Quit date: 11/28/1999  . Smokeless tobacco: Never Used  . Alcohol use No     Comment: used to drink a pack on weekends.   . Drug use: No  . Sexual activity: Yes    Partners: Female   Other Topics Concern  . 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  aspirin EC 81 MG tablet, Take 1 tablet (81 mg total) by  mouth daily., Disp: 30 tablet, Rfl: 0 .  atorvastatin (LIPITOR) 40 MG tablet, Take 1 tablet (40 mg total) by mouth daily., Disp: 90 tablet, Rfl: 1 .  baclofen (LIORESAL) 20 MG tablet, Take 1 tablet (20 mg total) by mouth 3 (three) times daily., Disp: 21 each, Rfl: 0 .  DULoxetine (CYMBALTA) 30 MG capsule, Take 1-2 capsules (30-60 mg total) by mouth daily. Start at 30 mg for the first week, after that 2 daily, Disp: 60 capsule, Rfl: 0 .  fluticasone (FLONASE) 50 MCG/ACT nasal spray, instill 2 sprays into each nostril at bedtime if needed, Disp: 16 g, Rfl: 1 .  ketoconazole (NIZORAL) 2 % cream, Apply 1 application topically 2 (two) times daily., Disp: 60 g, Rfl: 2 .  levETIRAcetam (KEPPRA) 250 MG tablet, Take 1 tablet (250 mg total) by mouth 2 (two) times daily., Disp: 60 tablet, Rfl: 0 .  loratadine (CLARITIN) 10 MG tablet, Take 1 tablet (10 mg total) by mouth 2 (two) times daily., Disp: 180 tablet, Rfl: 1 .  meloxicam (MOBIC) 15 MG tablet, Take 1 tablet by mouth daily., Disp: , Rfl: 0 .  metFORMIN (GLUCOPHAGE-XR) 750 MG 24 hr tablet, take 1 tablet by mouth once daily WITH BREAKFAST, Disp: 90 tablet, Rfl: 1 .  montelukast (SINGULAIR) 10 MG tablet, Take 1 tablet (10 mg total) by mouth daily., Disp: 90 tablet, Rfl: 1 .  traZODone (DESYREL) 100 MG tablet, Take 1 tablet (100 mg total) by mouth at bedtime., Disp: 90 tablet, Rfl: 0  Allergies  Allergen Reactions  . Ibuprofen Itching  . Levofloxacin   . Lisinopril Cough  . Metoprolol   . Nsaids Itching  . Tramadol Hcl      ROS  Constitutional: Negative for fever or weight change.  Respiratory: Negative for cough and shortness of breath.   Cardiovascular: Negative for chest pain or palpitations.  Gastrointestinal: Negative for abdominal pain, no bowel changes.  Musculoskeletal: Negative for gait problem or joint swelling.  Skin: Negative for rash.  Neurological: Negative for dizziness or headache.  No other specific complaints in a complete  review of systems (except as listed in HPI above).  Objective  There were no vitals filed for this visit.  There is no height or weight on file to calculate BMI.  Physical  Exam  Constitutional: Patient appears well-developed and well-nourished.  No distress.  HEENT: head atraumatic, normocephalic, pupils equal and reactive to light, neck supple, throat within normal limits Cardiovascular: Normal rate, regular rhythm and normal heart sounds.  No murmur heard. No BLE edema. Pulmonary/Chest: Effort normal and breath sounds normal. No respiratory distress. Abdominal: Soft.  There is no tenderness. Psychiatric: Patient has a normal mood and affect. behavior is normal. Judgment and thought content normal.   Recent Results (from the past 2160 hour(s))  HM DIABETES EYE EXAM     Status: None   Collection Time: 08/26/16 12:00 AM  Result Value Ref Range   HM Diabetic Eye Exam No Retinopathy No Retinopathy    Comment: Dr. Benay Pillow, [facility]  POCT HgB A1C     Status: Normal   Collection Time: 10/04/16  9:54 AM  Result Value Ref Range   Hemoglobin A1C 6.1      PHQ2/9: Depression screen [facility] 2/9 10/04/2016 05/28/2016 02/02/2016 11/08/2015 06/15/2015  Decreased Interest 3 0 0 0 0  Down, Depressed, Hopeless 0 0 0 0 0  PHQ - 2 Score 3 0 0 0 0  Altered sleeping 3 - - - -  Tired, decreased energy 3 - - - -  Change in appetite 3 - - - -  Feeling bad or failure about yourself  0 - - - -  Trouble concentrating 3 - - - -  Moving slowly or fidgety/restless 0 - - - -  Suicidal thoughts 0 - - - -  PHQ-9 Score 15 - - - -  Difficult doing work/chores Very difficult - - - -     Fall Risk: Fall Risk  05/28/2016 02/02/2016 11/08/2015 06/15/2015 03/08/2015  Falls in the past year? Yes No No No No  Number falls in past yr: 1 - - - -  Injury with Fall? No - - - -    Assessment & Plan  1. 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 Fatty liver  Last labs reviewed from Dec   3. History of alcoholism ([place])   4. Partial epilepsy with impairment of consciousness ([place])  Continue follow up with Dr. Manuella Ghazi   5. Dyslipidemia  On statin therapy   6. Essential (primary) hypertension  Well controlled  7. Vitamin D deficiency  Continue supplementation  8. Perennial allergic rhinitis with seasonal variation  A little worse this time of the year, explained importance of compliance with medication  9. Other fatigue  Possibly secondary to Sleep apnea, but can also be from depression ( we will start therapy now), labs within normal limits - multiple labs done one year ago, including TSH, vitamin D and B12 that were normal   10. Other insomnia  - traZODone (DESYREL) 100 MG tablet; Take 1 tablet (100 mg total) by mouth at bedtime.  Dispense: 90 tablet; Refill: 0  11. Acute neck pain  - baclofen (LIORESAL) 20 MG tablet; Take 1 tablet (20 mg total) by mouth 3 (three) times daily.  Dispense: 21 each; Refill: 0  12. Episode of moderate major depression (HCC)  - DULoxetine (CYMBALTA) 30 MG capsule; Take 1-2 capsules (30-60 mg total) by mouth daily. Start at 30 mg for the first week, after that 2 daily  Dispense: 60 capsule;  Refill: 0

## 2016-10-30 ENCOUNTER — Telehealth: Payer: Self-pay | Admitting: Family Medicine

## 2016-10-30 DIAGNOSIS — J302 Other seasonal allergic rhinitis: Secondary | ICD-10-CM

## 2016-10-30 DIAGNOSIS — J3089 Other allergic rhinitis: Principal | ICD-10-CM

## 2016-10-31 ENCOUNTER — Other Ambulatory Visit: Payer: Self-pay | Admitting: Family Medicine

## 2016-10-31 NOTE — Telephone Encounter (Signed)
Pt.notified

## 2016-11-06 ENCOUNTER — Ambulatory Visit: Payer: BLUE CROSS/BLUE SHIELD | Admitting: Family Medicine

## 2016-12-04 ENCOUNTER — Ambulatory Visit: Payer: BLUE CROSS/BLUE SHIELD | Attending: Neurology

## 2016-12-04 DIAGNOSIS — G4733 Obstructive sleep apnea (adult) (pediatric): Secondary | ICD-10-CM | POA: Diagnosis not present

## 2016-12-04 DIAGNOSIS — G4700 Insomnia, unspecified: Secondary | ICD-10-CM | POA: Diagnosis present

## 2016-12-04 DIAGNOSIS — R0683 Snoring: Secondary | ICD-10-CM | POA: Diagnosis present

## 2016-12-04 DIAGNOSIS — G4761 Periodic limb movement disorder: Secondary | ICD-10-CM | POA: Insufficient documentation

## 2016-12-09 ENCOUNTER — Encounter: Payer: Self-pay | Admitting: Family Medicine

## 2016-12-09 ENCOUNTER — Ambulatory Visit (INDEPENDENT_AMBULATORY_CARE_PROVIDER_SITE_OTHER): Payer: BLUE CROSS/BLUE SHIELD | Admitting: Family Medicine

## 2016-12-09 VITALS — BP 106/72 | HR 96 | Temp 97.6°F | Resp 16 | Ht 65.0 in | Wt 154.9 lb

## 2016-12-09 DIAGNOSIS — F325 Major depressive disorder, single episode, in full remission: Secondary | ICD-10-CM | POA: Insufficient documentation

## 2016-12-09 DIAGNOSIS — J302 Other seasonal allergic rhinitis: Secondary | ICD-10-CM

## 2016-12-09 DIAGNOSIS — E785 Hyperlipidemia, unspecified: Secondary | ICD-10-CM

## 2016-12-09 DIAGNOSIS — G2581 Restless legs syndrome: Secondary | ICD-10-CM | POA: Diagnosis not present

## 2016-12-09 DIAGNOSIS — J3089 Other allergic rhinitis: Secondary | ICD-10-CM

## 2016-12-09 DIAGNOSIS — B356 Tinea cruris: Secondary | ICD-10-CM

## 2016-12-09 DIAGNOSIS — G4733 Obstructive sleep apnea (adult) (pediatric): Secondary | ICD-10-CM

## 2016-12-09 DIAGNOSIS — F321 Major depressive disorder, single episode, moderate: Secondary | ICD-10-CM | POA: Diagnosis not present

## 2016-12-09 MED ORDER — MONTELUKAST SODIUM 10 MG PO TABS: 10.0000 mg | ORAL_TABLET | Freq: Every day | ORAL | 1 refills | Status: DC

## 2016-12-09 MED ORDER — FLUTICASONE PROPIONATE 50 MCG/ACT NA SUSP: NASAL | 1 refills | Status: DC

## 2016-12-09 MED ORDER — KETOCONAZOLE 2 % EX CREA: 1.0000 "application " | TOPICAL_CREAM | Freq: Two times a day (BID) | CUTANEOUS | 2 refills | Status: DC

## 2016-12-09 MED ORDER — DULOXETINE HCL 60 MG PO CPEP: 60.0000 mg | ORAL_CAPSULE | Freq: Every day | ORAL | 1 refills | Status: DC

## 2016-12-09 MED ORDER — ATORVASTATIN CALCIUM 40 MG PO TABS: 40.0000 mg | ORAL_TABLET | Freq: Every day | ORAL | 1 refills | Status: DC

## 2016-12-09 MED ORDER — LORATADINE 10 MG PO TABS: 10.0000 mg | ORAL_TABLET | Freq: Two times a day (BID) | ORAL | 1 refills | Status: DC

## 2016-12-09 NOTE — Progress Notes (Signed)
Name: David Skinner   MRN: [id number]    DOB: 1950/10/03   Date:12/09/2016       Progress Note  Subjective  Chief Complaint  Chief Complaint  Patient presents with  . Follow-up    1 month F/U  . Depression    Forgot how to take them and has only been taking 1. States he is doing well, sleeping better and better appetite.   . Fatigue    Has improved  . Memory Loss    Still forgetting alot of things, such as which medications he takes.     HPI  Major depression: he had an episode in the past about 5 years ago, but over the past 5 months he has been feeling down again and worse the week before his last follow up April 2018 he had noticed increase in  fatigue, lack of motivation, no energy, difficulty staying asleep and decrease in appetite, we started him on Cymbalta, he is still on 30 mg daily and has noticed a significant improvement of symptoms. He denies thought of suicide or homicide.   Hyperlipidemia: taking Atorvastatin, denies side effects, no chest pain or myalgia.  OSA/minimal/RLS: he sees Dr Manuella Ghazi for memory loss and had sleep study done on the 18 th, it showed minimal sleep apnea and RLS - patient states needs to move when he first goes to bed, he has follow up with Dr. Manuella Ghazi next week.   AR: he is doing well, continue current regiment, no rhinorrhea, nasal congestion or sneezing.   Patient Active Problem List   Diagnosis Date Noted  . OSA (obstructive sleep apnea) 12/09/2016  . RLS (restless legs syndrome) 12/09/2016  . Episode of moderate major depression ([place]) 12/09/2016  . History of alcoholism ([place]) 07/22/2016  . Cervical radiculitis 01/09/2016  . Lumbosacral radiculitis 01/09/2016  . Osteoarthritis of carpometacarpal (CMC) joint of thumb 01/09/2016  . Tinea cruris 03/21/2015  . 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 Depression with anxiety 11/28/2014  . Chronic constipation 11/28/2014  . Insomnia 11/28/2014  .  Dyslipidemia 11/28/2014  . Fatty infiltration of liver 11/28/2014  . Essential (primary) hypertension 11/28/2014  . Gastric reflux 11/28/2014  . Memory loss or impairment 11/28/2014  . Perennial allergic rhinitis with seasonal variation 11/28/2014  . Vitamin D deficiency 11/28/2014  . Tenosynovitis of thumb 11/28/2014  . Partial epilepsy with impairment of consciousness ([place]) 11/16/2014    Past Surgical History:  Procedure Laterality Date  . LASIK Bilateral     Family History  Problem Relation Age of Onset  . Family history unknown: Yes    Social History   Social History  . Marital status: Single    Spouse name: N/A  . Number of children: N/A  . Years of education: N/A   Occupational History  . nail technician    Social History Main Topics  . Smoking status: Former Smoker    Packs/day: 1.00    Years: 15.00    Types: Cigarettes    Start date: 06/17/1984    Quit date: 11/28/1999  . Smokeless tobacco: Never Used  . Alcohol use No     Comment: used to drink a pack on weekends.   . Drug use: No  . Sexual activity: Yes    Partners: Female   Other Topics Concern  . 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  aspirin EC 81 MG tablet, Take 1 tablet (81 mg total) by mouth daily., Disp: 30 tablet, Rfl: 0 .  atorvastatin (LIPITOR) 40 MG tablet, Take 1 tablet (40 mg total) by mouth daily., Disp: 90 tablet, Rfl: 1 .  DULoxetine (CYMBALTA) 60 MG capsule, Take 1 capsule (60 mg total) by mouth daily. Start at 30 mg for the first week, after that 2 daily, Disp: 90 capsule, Rfl: 1 .  fluticasone (FLONASE) 50 MCG/ACT nasal spray, instill 2 sprays into each nostril at bedtime if needed, Disp: 48 g, Rfl: 1 .  ketoconazole (NIZORAL) 2 % cream, Apply 1 application topically 2 (two) times daily., Disp: 60 g, Rfl: 2 .  levETIRAcetam (KEPPRA) 250 MG tablet, Take 1 tablet (250 mg total) by mouth 2 (two)  times daily., Disp: 60 tablet, Rfl: 0 .  loratadine (CLARITIN) 10 MG tablet, Take 1 tablet (10 mg total) by mouth 2 (two) times daily., Disp: 180 tablet, Rfl: 1 .  meloxicam (MOBIC) 15 MG tablet, Take 1 tablet by mouth daily., Disp: , Rfl: 0 .  metFORMIN (GLUCOPHAGE-XR) 750 MG 24 hr tablet, take 1 tablet by mouth once daily WITH BREAKFAST, Disp: 90 tablet, Rfl: 1 .  montelukast (SINGULAIR) 10 MG tablet, Take 1 tablet (10 mg total) by mouth daily., Disp: 90 tablet, Rfl: 1  Allergies  Allergen Reactions  . Ibuprofen Itching  . Levofloxacin   . Lisinopril Cough  . Metoprolol   . Nsaids Itching  . Tramadol Hcl      ROS  Constitutional: Negative for fever or weight change.  Respiratory: Negative for cough and shortness of breath.   Cardiovascular: Negative for chest pain or palpitations.  Gastrointestinal: Negative for abdominal pain, no bowel changes.  Musculoskeletal: Negative for gait problem or joint swelling.  Skin: Positive for rash.  Neurological: Negative for dizziness, intermittent  Headache - improving.  No other specific complaints in a complete review of systems (except as listed in HPI above).  Objective  Vitals:   12/09/16 1419  BP: 106/72  Pulse: 96  Resp: 16  Temp: 97.6 F (36.4 C)  TempSrc: Oral  SpO2: 97%  Weight: 154 lb 14.4 oz (70.3 kg)  Height: 5\' 5"  (1.651 m)    Body mass index is 25.78 kg/m.  Physical Exam  Constitutional: Patient appears well-developed and well-nourished. Overweight.  No distress.  HEENT: head atraumatic, normocephalic, pupils equal and reactive to light,  neck supple, throat within normal limits Cardiovascular: Normal rate, regular rhythm and normal heart sounds.  No murmur heard. No BLE edema. Pulmonary/Chest: Effort normal and breath sounds normal. No respiratory distress. Abdominal: Soft.  There is no tenderness. Psychiatric: Patient has a normal mood and affect. behavior is normal. Judgment and thought content  normal.  Recent Results (from the past 2160 hour(s))  POCT HgB A1C     Status: Normal   Collection Time: 10/04/16  9:54 AM  Result Value Ref Range   Hemoglobin A1C 6.1     PHQ2/9: Depression screen [facility] 2/9 12/09/2016 10/04/2016 05/28/2016 02/02/2016 11/08/2015  Decreased Interest 0 3 0 0 0  Down, Depressed, Hopeless 0 0 0 0 0  PHQ - 2 Score 0 3 0 0 0  Altered sleeping - 3 - - -  Tired, decreased energy - 3 - - -  Change in appetite - 3 - - -  Feeling bad or failure about yourself  - 0 - - -  Trouble concentrating - 3 - - -  Moving slowly or fidgety/restless -  0 - - -  Suicidal thoughts - 0 - - -  PHQ-9 Score - 15 - - -  Difficult doing work/chores - Very difficult - - -     Fall Risk: Fall Risk  12/09/2016 05/28/2016 02/02/2016 11/08/2015 06/15/2015  Falls in the past year? No Yes No No No  Number falls in past yr: - 1 - - -  Injury with Fall? - No - - -    Functional Status Survey: Is the patient deaf or have difficulty hearing?: No Does the patient have difficulty seeing, even when wearing glasses/contacts?: No Does the patient have difficulty concentrating, remembering, or making decisions?: Yes Does the patient have difficulty walking or climbing stairs?: No Does the patient have difficulty dressing or bathing?: No Does the patient have difficulty doing errands alone such as visiting a doctor's office or shopping?: No    Assessment & Plan  1. OSA (obstructive sleep apnea)  Very mild, unlikely he will need CPAP, has follow up with Dr. Manuella Ghazi next week   2. RLS (restless legs syndrome)  On sleep study ordered by Dr. Manuella Ghazi, he has a follow up with him next week  3. Episode of moderate major depression ([place])  Doing well, continue Cymbalta but at hight dose - DULoxetine (CYMBALTA) 60 MG capsule; Take 1 capsule (60 mg total) by mouth daily. Start at 30 mg for the first week, after that 2 daily  Dispense: 90 capsule; Refill: 1  4. Dyslipidemia  - atorvastatin (LIPITOR)  40 MG tablet; Take 1 tablet (40 mg total) by mouth daily.  Dispense: 90 tablet; Refill: 1  5. Tinea cruris  - ketoconazole (NIZORAL) 2 % cream; Apply 1 application topically 2 (two) times daily.  Dispense: 60 g; Refill: 2  6. Perennial allergic rhinitis with seasonal variation  - montelukast (SINGULAIR) 10 MG tablet; Take 1 tablet (10 mg total) by mouth daily.  Dispense: 90 tablet; Refill: 1 - loratadine (CLARITIN) 10 MG tablet; Take 1 tablet (10 mg total) by mouth 2 (two) times daily.  Dispense: 180 tablet; Refill: 1 - fluticasone (FLONASE) 50 MCG/ACT nasal spray; instill 2 sprays into each nostril at bedtime if needed  Dispense: 48 g; Refill: 1

## 2017-02-05 ENCOUNTER — Encounter: Payer: Self-pay | Admitting: Family Medicine

## 2017-02-05 ENCOUNTER — Ambulatory Visit (INDEPENDENT_AMBULATORY_CARE_PROVIDER_SITE_OTHER): Payer: BLUE CROSS/BLUE SHIELD | Admitting: Family Medicine

## 2017-02-05 VITALS — BP 110/64 | HR 95 | Temp 98.1°F | Resp 16 | Ht 65.0 in | Wt 159.6 lb

## 2017-02-05 DIAGNOSIS — E785 Hyperlipidemia, unspecified: Secondary | ICD-10-CM | POA: Diagnosis not present

## 2017-02-05 DIAGNOSIS — F1021 Alcohol dependence, in remission: Secondary | ICD-10-CM | POA: Diagnosis not present

## 2017-02-05 DIAGNOSIS — G4733 Obstructive sleep apnea (adult) (pediatric): Secondary | ICD-10-CM | POA: Diagnosis not present

## 2017-02-05 DIAGNOSIS — E1149 Type 2 diabetes mellitus with other diabetic neurological complication: Secondary | ICD-10-CM

## 2017-02-05 DIAGNOSIS — R198 Other specified symptoms and signs involving the digestive system and abdomen: Secondary | ICD-10-CM

## 2017-02-05 DIAGNOSIS — Z1211 Encounter for screening for malignant neoplasm of colon: Secondary | ICD-10-CM

## 2017-02-05 DIAGNOSIS — F418 Other specified anxiety disorders: Secondary | ICD-10-CM

## 2017-02-05 DIAGNOSIS — M5412 Radiculopathy, cervical region: Secondary | ICD-10-CM | POA: Diagnosis not present

## 2017-02-05 DIAGNOSIS — G40209 Localization-related (focal) (partial) symptomatic epilepsy and epileptic syndromes with complex partial seizures, not intractable, without status epilepticus: Secondary | ICD-10-CM

## 2017-02-05 DIAGNOSIS — J302 Other seasonal allergic rhinitis: Secondary | ICD-10-CM

## 2017-02-05 DIAGNOSIS — I10 Essential (primary) hypertension: Secondary | ICD-10-CM | POA: Diagnosis not present

## 2017-02-05 DIAGNOSIS — N521 Erectile dysfunction due to diseases classified elsewhere: Secondary | ICD-10-CM | POA: Diagnosis not present

## 2017-02-05 DIAGNOSIS — E1140 Type 2 diabetes mellitus with diabetic neuropathy, unspecified: Secondary | ICD-10-CM

## 2017-02-05 DIAGNOSIS — J3089 Other allergic rhinitis: Secondary | ICD-10-CM | POA: Diagnosis not present

## 2017-02-05 DIAGNOSIS — Z86010 Personal history of colonic polyps: Secondary | ICD-10-CM

## 2017-02-05 DIAGNOSIS — R351 Nocturia: Secondary | ICD-10-CM | POA: Diagnosis not present

## 2017-02-05 DIAGNOSIS — Z1212 Encounter for screening for malignant neoplasm of rectum: Secondary | ICD-10-CM

## 2017-02-05 DIAGNOSIS — N400 Enlarged prostate without lower urinary tract symptoms: Secondary | ICD-10-CM

## 2017-02-05 LAB — POCT GLYCOSYLATED HEMOGLOBIN (HGB A1C): Hemoglobin A1C: 5.9

## 2017-02-05 MED ORDER — TAMSULOSIN HCL 0.4 MG PO CAPS: 0.4000 mg | ORAL_CAPSULE | Freq: Every day | ORAL | 0 refills | Status: DC

## 2017-02-05 MED ORDER — BACLOFEN 20 MG PO TABS: 20.0000 mg | ORAL_TABLET | Freq: Every evening | ORAL | 0 refills | Status: DC | PRN

## 2017-02-05 NOTE — Patient Instructions (Addendum)
-   Call your pharmacy to ask about getting your CYMBALTA (DULOXETINE)   - STOP TAKING COZAAR ([place])

## 2017-02-05 NOTE — Progress Notes (Signed)
Name: David Skinner   MRN: [id number]    DOB: [birth year]   Date:02/05/2017       Progress Note  Subjective  Chief Complaint  Chief Complaint  Patient presents with  . Diabetes  . Depression  . Sleep Apnea  . Hyperlipidemia    HPI  Telephone interpreter for [place] is used for this visit.  HTN: his bp has been towards low end of normal, he states that he is on low dose Losartan and is taking it daily, however this is no longer on his medication list and he is instructed to stop because his BP is low. Urine micro negative. He denies orthostatic changes, no chest pain or palpitation. Last CMP 12.17 and was normal.  Denies lightheadedness, chest pain, shortness of breath, or swelling.  Major depression: he had an episode in the past about 5 years ago, has recurrence and was feeling down for several months, but is feeling better now - still some fatigue and difficulty staying asleep; decrease in appetite. He has not been taking Cymbalta because he did not know he needed to get it filled, he will fill it and see how he feels on this medication.  Alcoholism: he used to drink heavy while in [place] used to drink liquor at least 3 times a week and used to get drunk, when he moved to [place] (1979)  he started to drink beer, mostly on weekends, but quit  Completely about 1 year.   Hyperlipidemia: doing well on statin and also aspirin 81 mg daily. Last check was 05/2016 and was at goal.  No myalgias, chest pain or SOB.  AR: he is taking Flonase, Loratadine, and Singulair, no longer coughing or wheezing. He smoked in the past but quit over 11 years ago. Symptoms under control now, worse in the Spring.  Partial Seizure: he was taking anti seizure medication but it was stopped by Dr. Manuella Ghazi. Last episode 02/2016 and went to [facility], he had been off Keppra and it was resumed at the time, he is still on medication, seeing Dr. Craig Staggers as recommended. He had sleep study done 12/09/2016 by Dr. Manuella Ghazi - and was not  given a sleep apnea mask - was minimal OSA.  Diabetes Type 2 diet controlled with ED, hgbA1C hadcome down to 5.9% today and we started Metformin back in 10/2015, he denies side effects of medication. He denies polyphagia but endorses polyuria worse at night and some polydipsia. Urine micro, foot exam, and eye exam are up to date. On statin, aspirin, and ARB  Nocturia: Patient notes getting up to urinate several times at night.  IPSS Questionnaire (AUA-7): Over the past month.   1)  How often have you had a sensation of not emptying your bladder completely after you finish urinating?  5 - Almost always  2)  How often have you had to urinate again less than two hours after you finished urinating? 4 -More than half the time  3)  How often have you found you stopped and started again several times when you urinated?  4 - More than half the time  4) How difficult have you found it to postpone urination?  5 - Almost always  5) How often have you had a weak urinary stream?  0 - Not at all  6) How often have you had to push or strain to begin urination?  5 - Almost always  7) How many times did you most typically get up to urinate from the time  you went to bed until the time you got up in the morning?  5 - 5+ times  Total score:  0-7 mildly symptomatic   8-19 moderately symptomatic   20-35 severely symptomatic  Result: 28; SEVERE, we will check PSA today and perform rectal examination.   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 Discussed exercises, massage; did a short course of muscle relaxer in the past and this helped, we will repeat today.  Change in bowel movement: he has noticed that he has a bowel movement usually [place] 5, and has to use restroom again, shortly after, no blood in stools, no abdominal pain, noticed changes within the past 3 months and he is due for repeat colonoscopy   Patient  is usually not as talkative, he may feel more comfortable with phone translator than a extra person in the office Patient Active Problem List   Diagnosis Date Noted  . OSA (obstructive sleep apnea) 12/09/2016  . RLS (restless legs syndrome) 12/09/2016  . Episode of moderate major depression ([place]) 12/09/2016  . History of alcoholism ([place]) 07/22/2016  . Cervical radiculitis 01/09/2016  . Lumbosacral radiculitis 01/09/2016  . Osteoarthritis of carpometacarpal (CMC) joint of thumb 01/09/2016  . Tinea cruris 03/21/2015  . 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 Depression with anxiety 11/28/2014  . Chronic constipation 11/28/2014  . Insomnia 11/28/2014  . Dyslipidemia 11/28/2014  . Fatty infiltration of liver 11/28/2014  . Essential (primary) hypertension 11/28/2014  . Gastric reflux 11/28/2014  . Memory loss or impairment 11/28/2014  . Perennial allergic rhinitis with seasonal variation 11/28/2014  . Vitamin D deficiency 11/28/2014  . Tenosynovitis of thumb 11/28/2014  . Partial epilepsy with impairment of consciousness ([place]) 11/16/2014    Past Surgical History:  Procedure Laterality Date  . LASIK Bilateral     Family History  Problem Relation Age of Onset  . Family history unknown: Yes    Social History   Social History  . Marital status: Single    Spouse name: N/A  . Number of children: N/A  . Years of education: N/A   Occupational History  . nail technician    Social History Main Topics  . Smoking status: Former Smoker    Packs/day: 1.00    Years: 15.00    Types: Cigarettes    Start date: 06/17/1984    Quit date: 11/28/1999  . Smokeless tobacco: Never Used  . Alcohol use No     Comment: used to drink a pack on weekends.   . Drug use: No  . Sexual activity: Yes    Partners: Female   Other Topics Concern  . 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  aspirin EC 81 MG tablet, Take 1 tablet (81 mg total) by mouth daily., Disp: 30 tablet, Rfl: 0 .  atorvastatin (LIPITOR) 40 MG tablet, Take 1 tablet (40 mg total) by mouth daily., Disp: 90 tablet, Rfl: 1 .  DULoxetine (CYMBALTA) 60 MG capsule, Take 1 capsule (60 mg total) by mouth daily. Start at 30 mg for the first week, after that 2 daily, Disp: 90 capsule, Rfl: 1 .  fluticasone (FLONASE) 50 MCG/ACT nasal spray, instill 2 sprays into each nostril at bedtime if needed, Disp: 48  g, Rfl: 1 .  ketoconazole (NIZORAL) 2 % cream, Apply 1 application topically 2 (two) times daily., Disp: 60 g, Rfl: 2 .  levETIRAcetam (KEPPRA) 250 MG tablet, Take 1 tablet (250 mg total) by mouth 2 (two) times daily., Disp: 60 tablet, Rfl: 0 .  loratadine (CLARITIN) 10 MG tablet, Take 1 tablet (10 mg total) by mouth 2 (two) times daily., Disp: 180 tablet, Rfl: 1 .  meloxicam (MOBIC) 15 MG tablet, Take 1 tablet by mouth daily., Disp: , Rfl: 0 .  metFORMIN (GLUCOPHAGE-XR) 750 MG 24 hr tablet, take 1 tablet by mouth once daily WITH BREAKFAST, Disp: 90 tablet, Rfl: 1 .  montelukast (SINGULAIR) 10 MG tablet, Take 1 tablet (10 mg total) by mouth daily., Disp: 90 tablet, Rfl: 1  Allergies  Allergen Reactions  . Ibuprofen Itching  . Levofloxacin   . Lisinopril Cough  . Metoprolol   . Nsaids Itching  . Tramadol Hcl      ROS  Constitutional: Negative for fever or weight change.  Respiratory: Negative for cough and shortness of breath.   Cardiovascular: Negative for chest pain or palpitations.  Gastrointestinal: Negative for abdominal pain, no bowel changes.  GU: See HPI Musculoskeletal: Negative for gait problem or joint swelling.  Skin: Negative for rash.  Neurological: Negative for dizziness or headache.  No other specific complaints in a complete review of systems (except as listed in HPI above).  Objective  Vitals:   02/05/17 1354  BP: 110/64  Pulse: 95  Resp: 16  Temp: 98.1  F (36.7 C)  SpO2: 95%  Weight: 159 lb 9 oz (72.4 kg)  Height: 5\' 5"  (1.651 m)   Body mass index is 26.55 kg/m.  Physical Exam Constitutional: Patient appears well-developed and well-nourished. No distress.  HENT: Head: Normocephalic and atraumatic Mouth/Throat: Oropharynx is clear and moist. No oropharyngeal exudate.  Eyes: Conjunctivae and EOM are normal. Pupils are equal, round, and reactive to light. No scleral icterus.  Neck: Normal range of motion. Neck supple. No JVD present. No thyromegaly present.  Cardiovascular: Normal rate, regular rhythm and normal heart sounds.  No murmur heard. No BLE edema. Pulmonary/Chest: Effort normal and breath sounds normal. No respiratory distress. Abdominal: Soft. Bowel sounds are normal, no distension. There is no tenderness. no masses RECTAL: Prostate slightly enlarged in size and consistency, no rectal masses or hemorrhoids Neurological: he is alert and oriented to person, place, and time. No cranial nerve deficit. Coordination, balance, strength, speech and gait are normal.  Skin: Skin is warm and dry. No rash noted. No erythema.  Psychiatric: Patient has a normal mood and affect. behavior is normal. Judgment and thought content normal.  No results found for this or any previous visit (from the past 2160 hour(s)).  PHQ2/9: Depression screen [facility] 2/9 12/09/2016 10/04/2016 05/28/2016 02/02/2016 11/08/2015  Decreased Interest 0 3 0 0 0  Down, Depressed, Hopeless 0 0 0 0 0  PHQ - 2 Score 0 3 0 0 0  Altered sleeping - 3 - - -  Tired, decreased energy - 3 - - -  Change in appetite - 3 - - -  Feeling bad or failure about yourself  - 0 - - -  Trouble concentrating - 3 - - -  Moving slowly or fidgety/restless - 0 - - -  Suicidal thoughts - 0 - - -  PHQ-9 Score - 15 - - -  Difficult doing work/chores - Very difficult - - -    Fall Risk: Fall Risk  12/09/2016 05/28/2016 02/02/2016 11/08/2015 06/15/2015  Falls in the past year? No Yes No No No   Number falls in past yr: - 1 - - -  Injury with Fall? - No - - -   Assessment & Plan  1. 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 Nocturia - We will check labs today - PSA  3. Essential (primary) hypertension - STOP Cozaar, continue with lifestyle only.  4. Partial epilepsy with impairment of consciousness ([place]) Stable, continue follow up with Neurology  5. Dyslipidemia Stable, continue statin  6. Depression with anxiety Start Cymbalta as discussed.  7. Perennial allergic rhinitis with seasonal variation Stable, continue medications  8. History of alcoholism ([place]) Stable and in remission  9. OSA (obstructive sleep apnea) Stable, minimal, no CPAP required  10. Cervical radiculitis - baclofen (LIORESAL) 20 MG tablet; Take 1 tablet (20 mg total) by mouth at bedtime as needed for muscle spasms.  Dispense: 30 each; Refill: 0 - Heat and gentle stretching.  -Reviewed Health Maintenance: schedule annual physical

## 2017-02-06 LAB — PSA: PSA: 0.4 ng/mL (ref <=4.0)

## 2017-02-10 ENCOUNTER — Ambulatory Visit: Payer: BLUE CROSS/BLUE SHIELD | Admitting: Family Medicine

## 2017-03-03 ENCOUNTER — Encounter: Payer: Self-pay | Admitting: Family Medicine

## 2017-03-03 ENCOUNTER — Ambulatory Visit (INDEPENDENT_AMBULATORY_CARE_PROVIDER_SITE_OTHER): Payer: BLUE CROSS/BLUE SHIELD | Admitting: Family Medicine

## 2017-03-03 VITALS — BP 110/64 | HR 116 | Temp 97.9°F | Resp 16 | Ht 65.0 in | Wt 158.0 lb

## 2017-03-03 DIAGNOSIS — R194 Change in bowel habit: Secondary | ICD-10-CM

## 2017-03-03 DIAGNOSIS — R140 Abdominal distension (gaseous): Secondary | ICD-10-CM

## 2017-03-03 MED ORDER — POLYETHYLENE GLYCOL 3350 17 GM/SCOOP PO POWD: 17.0000 g | Freq: Every day | ORAL | 0 refills | Status: DC

## 2017-03-03 NOTE — Progress Notes (Signed)
Name: David Skinner   MRN: [id number]    DOB: 1951-05-11   Date:03/03/2017       Progress Note  Subjective  Chief Complaint  Chief Complaint  Patient presents with  . stomache ache    for about 10 days pt has been taking gas x which has helped    HPI  Abdominal pain: he states symptoms of bloating started suddenly 10 days ago , gas -X seems to help. He denies nausea, vomiting or diarrhea. No change in diet, no sick contacts.He states his bowel movements are usually daily but has been every 2-3 days since abdominal pain started,  no blood or mucus in stools. Based on Bristol scale previous it was #4 and is now #3. No change in diet, no sick contacts. He is due for a colonoscopy and referral was made a couple of weeks ago.  He states his appetite has been poor. He states he had an episode like this a long time ago. He denies alcohol use. He denies spicy food.   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   Patient Active Problem List   Diagnosis Date Noted  . OSA (obstructive sleep apnea) 12/09/2016  . RLS (restless legs syndrome) 12/09/2016  . Episode of moderate major depression ([place]) 12/09/2016  . History of alcoholism ([place]) 07/22/2016  . Cervical radiculitis 01/09/2016  . Lumbosacral radiculitis 01/09/2016  . Osteoarthritis of carpometacarpal (CMC) joint of thumb 01/09/2016  . Tinea cruris 03/21/2015  . 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 Depression with anxiety 11/28/2014  . Chronic constipation 11/28/2014  . Insomnia 11/28/2014  . Dyslipidemia 11/28/2014  . Fatty infiltration of liver 11/28/2014  . Essential (primary) hypertension 11/28/2014  . Gastric reflux 11/28/2014  . Memory loss or impairment 11/28/2014  . Perennial allergic rhinitis with seasonal variation 11/28/2014  . Vitamin D deficiency 11/28/2014  . Tenosynovitis of thumb 11/28/2014  . Partial epilepsy with impairment of consciousness ([place]) 11/16/2014    Past Surgical  History:  Procedure Laterality Date  . LASIK Bilateral     Family History  Problem Relation Age of Onset  . Family history unknown: Yes    Social History   Social History  . Marital status: Single    Spouse name: N/A  . Number of children: N/A  . Years of education: N/A   Occupational History  . nail technician    Social History Main Topics  . Smoking status: Former Smoker    Packs/day: 1.00    Years: 15.00    Types: Cigarettes    Start date: 06/17/1984    Quit date: 11/28/1999  . Smokeless tobacco: Never Used  . Alcohol use No     Comment: used to drink a pack on weekends.   . Drug use: No  . Sexual activity: Yes    Partners: Female   Other Topics Concern  . 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  aspirin EC 81 MG tablet, Take 1 tablet (81 mg total) by mouth daily., Disp: 30 tablet, Rfl: 0 .  atorvastatin (LIPITOR) 40 MG tablet, Take 1 tablet (40 mg total) by mouth daily., Disp: 90 tablet, Rfl: 1 .  baclofen (LIORESAL) 20 MG tablet, Take 1 tablet (20 mg total) by mouth at bedtime as needed for muscle spasms., Disp: 30 each, Rfl: 0 .  DULoxetine (CYMBALTA) 60 MG capsule, Take  1 capsule (60 mg total) by mouth daily. Start at 30 mg for the first week, after that 2 daily, Disp: 90 capsule, Rfl: 1 .  fluticasone (FLONASE) 50 MCG/ACT nasal spray, instill 2 sprays into each nostril at bedtime if needed, Disp: 48 g, Rfl: 1 .  ketoconazole (NIZORAL) 2 % cream, Apply 1 application topically 2 (two) times daily., Disp: 60 g, Rfl: 2 .  levETIRAcetam (KEPPRA) 250 MG tablet, Take 1 tablet (250 mg total) by mouth 2 (two) times daily., Disp: 60 tablet, Rfl: 0 .  loratadine (CLARITIN) 10 MG tablet, Take 1 tablet (10 mg total) by mouth 2 (two) times daily., Disp: 180 tablet, Rfl: 1 .  meloxicam (MOBIC) 15 MG tablet, Take 1 tablet by mouth daily., Disp: , Rfl: 0 .  metFORMIN (GLUCOPHAGE-XR) 750 MG 24  hr tablet, take 1 tablet by mouth once daily WITH BREAKFAST, Disp: 90 tablet, Rfl: 1 .  montelukast (SINGULAIR) 10 MG tablet, Take 1 tablet (10 mg total) by mouth daily., Disp: 90 tablet, Rfl: 1 .  tamsulosin (FLOMAX) 0.4 MG CAPS capsule, Take 1 capsule (0.4 mg total) by mouth daily., Disp: 90 capsule, Rfl: 0  Allergies  Allergen Reactions  . Ibuprofen Itching  . Levofloxacin   . Lisinopril Cough  . Metoprolol   . Nsaids Itching  . Tramadol Hcl      ROS  Ten systems reviewed and is negative except as mentioned in HPI   Objective  Vitals:   03/03/17 1315  BP: 110/64  Pulse: (!) 116  Resp: 16  Temp: 97.9 F (36.6 C)  SpO2: 97%  Weight: 158 lb (71.7 kg)  Height: 5\' 5"  (1.651 m)    Body mass index is 26.29 kg/m.  Physical Exam  Constitutional: Patient appears well-developed and well-nourished.  No distress.  HEENT: head atraumatic, normocephalic, pupils equal and reactive to light,  neck supple, throat within normal limits Cardiovascular: Normal rate, regular rhythm and normal heart sounds.  No murmur heard. No BLE edema. Pulmonary/Chest: Effort normal and breath sounds normal. No respiratory distress. Abdominal: Soft.  There is no tenderness. Normal bowel sounds, no masses, rectal exam not done, we will refer him for further testing Psychiatric: Patient has a normal mood and affect. behavior is normal. Judgment and thought content normal.  Recent Results (from the past 2160 hour(s))  POCT HgB A1C     Status: Normal   Collection Time: 02/05/17  2:28 PM  Result Value Ref Range   Hemoglobin A1C 5.9   PSA     Status: None   Collection Time: 02/05/17  3:01 PM  Result Value Ref Range   PSA 0.4 <=4.0 ng/mL    Comment:   The total PSA value from this assay system is standardized against the WHO standard. The test result will be approximately 20% lower when compared to the equimolar-standardized total PSA (Beckman Coulter). Comparison of serial PSA results should be  interpreted with this fact in mind.   This test was performed using the Siemens chemiluminescent method. Values obtained from different assay methods cannot be used interchangeably. PSA levels, regardless of value, should not be interpreted as absolute evidence of the presence or absence of disease.         PHQ2/9: Depression screen [facility] 2/9 12/09/2016 10/04/2016 05/28/2016 02/02/2016 11/08/2015  Decreased Interest 0 3 0 0 0  Down, Depressed, Hopeless 0 0 0 0 0  PHQ - 2 Score 0 3 0 0 0  Altered sleeping - 3 - - -  Tired, decreased energy - 3 - - -  Change in appetite - 3 - - -  Feeling bad or failure about yourself  - 0 - - -  Trouble concentrating - 3 - - -  Moving slowly or fidgety/restless - 0 - - -  Suicidal thoughts - 0 - - -  PHQ-9 Score - 15 - - -  Difficult doing work/chores - Very difficult - - -     Fall Risk: Fall Risk  12/09/2016 05/28/2016 02/02/2016 11/08/2015 06/15/2015  Falls in the past year? No Yes No No No  Number falls in past yr: - 1 - - -  Injury with Fall? - No - - -     Assessment & Plan   1. Abdominal bloating  - Thyroid Panel With TSH - COMPLETE METABOLIC PANEL WITH GFR - CBC with Differential/Platelet  2. Recent change in frequency of bowel movements  - Thyroid Panel With TSH - COMPLETE METABOLIC PANEL WITH GFR - CBC with Differential/Platelet - polyethylene glycol powder (GLYCOLAX/MIRALAX) powder; Take 17 g by mouth daily.  Dispense: 3350 g; Refill: 0 - make sure he gets to see GI for further evaluation   He is constipated now

## 2017-03-04 LAB — CBC WITH DIFFERENTIAL/PLATELET
BASOS ABS: 37 {cells}/uL (ref 0–200)
Basophils Relative: 0.6 %
EOS PCT: 3.7 %
Eosinophils Absolute: 229 cells/uL (ref 15–500)
HEMATOCRIT: 50.3 % — AB (ref 38.5–50.0)
Hemoglobin: 16.8 g/dL (ref 13.2–17.1)
LYMPHS ABS: 1668 {cells}/uL (ref 850–3900)
MCH: 30.4 pg (ref 27.0–33.0)
MCHC: 33.4 g/dL (ref 32.0–36.0)
MCV: 91.1 fL (ref 80.0–100.0)
MPV: 9.6 fL (ref 7.5–12.5)
Monocytes Relative: 11.9 %
NEUTROS PCT: 56.9 %
Neutro Abs: 3528 cells/uL (ref 1500–7800)
Platelets: 260 10*3/uL (ref 140–400)
RBC: 5.52 10*6/uL (ref 4.20–5.80)
RDW: 12.6 % (ref 11.0–15.0)
Total Lymphocyte: 26.9 %
WBC: 6.2 10*3/uL (ref 3.8–10.8)
WBCMIX: 738 {cells}/uL (ref 200–950)

## 2017-03-04 LAB — COMPLETE METABOLIC PANEL WITH GFR
AG Ratio: 1.3 (calc) (ref 1.0–2.5)
ALBUMIN MSPROF: 4.4 g/dL (ref 3.6–5.1)
ALT: 48 U/L — AB (ref 9–46)
AST: 30 U/L (ref 10–35)
Alkaline phosphatase (APISO): 62 U/L (ref 40–115)
BUN: 19 mg/dL (ref 7–25)
CALCIUM: 9.9 mg/dL (ref 8.6–10.3)
CO2: 27 mmol/L (ref 20–32)
Chloride: 103 mmol/L (ref 98–110)
Creat: 0.93 mg/dL (ref 0.70–1.25)
GFR, EST AFRICAN AMERICAN: 101 mL/min/{1.73_m2} (ref >=60)
GFR, EST NON AFRICAN AMERICAN: 87 mL/min/{1.73_m2} (ref >=60)
GLUCOSE: 76 mg/dL (ref 65–99)
Globulin: 3.3 g/dL (calc) (ref 1.9–3.7)
Potassium: 4.8 mmol/L (ref 3.5–5.3)
Sodium: 139 mmol/L (ref 135–146)
TOTAL PROTEIN: 7.7 g/dL (ref 6.1–8.1)
Total Bilirubin: 0.8 mg/dL (ref 0.2–1.2)

## 2017-03-04 LAB — THYROID PANEL WITH TSH
Free Thyroxine Index: 2.2 (ref 1.4–3.8)
T3 Uptake: 30 % (ref 22–35)
T4, Total: 7.4 ug/dL (ref 4.9–10.5)
TSH: 0.45 m[IU]/L (ref 0.40–4.50)

## 2017-03-17 ENCOUNTER — Other Ambulatory Visit: Payer: Self-pay

## 2017-03-17 ENCOUNTER — Other Ambulatory Visit
Admission: RE | Admit: 2017-03-17 | Discharge: 2017-03-17 | Disposition: A | Discharge status: Home / Self Care | Payer: BLUE CROSS/BLUE SHIELD | Source: Ambulatory Visit | Attending: Gastroenterology | Admitting: Gastroenterology

## 2017-03-17 ENCOUNTER — Ambulatory Visit (INDEPENDENT_AMBULATORY_CARE_PROVIDER_SITE_OTHER): Payer: BLUE CROSS/BLUE SHIELD | Admitting: Gastroenterology

## 2017-03-17 ENCOUNTER — Encounter: Payer: Self-pay | Admitting: Gastroenterology

## 2017-03-17 VITALS — BP 138/90 | HR 108 | Temp 98.3°F | Ht 65.0 in | Wt 159.0 lb

## 2017-03-17 DIAGNOSIS — R358 Other polyuria: Secondary | ICD-10-CM | POA: Diagnosis not present

## 2017-03-17 DIAGNOSIS — R350 Frequency of micturition: Secondary | ICD-10-CM | POA: Diagnosis not present

## 2017-03-17 DIAGNOSIS — R3589 Other polyuria: Secondary | ICD-10-CM

## 2017-03-17 DIAGNOSIS — R740 Nonspecific elevation of levels of transaminase and lactic acid dehydrogenase [LDH]: Secondary | ICD-10-CM | POA: Diagnosis present

## 2017-03-17 DIAGNOSIS — R7401 Elevation of levels of liver transaminase levels: Secondary | ICD-10-CM

## 2017-03-17 DIAGNOSIS — R1013 Epigastric pain: Secondary | ICD-10-CM

## 2017-03-17 DIAGNOSIS — D126 Benign neoplasm of colon, unspecified: Secondary | ICD-10-CM

## 2017-03-17 LAB — URINALYSIS, ROUTINE W REFLEX MICROSCOPIC
Bilirubin Urine: NEGATIVE
GLUCOSE, UA: NEGATIVE mg/dL
HGB URINE DIPSTICK: NEGATIVE
Ketones, ur: NEGATIVE mg/dL
LEUKOCYTES UA: NEGATIVE
Nitrite: NEGATIVE
PH: 5 (ref 5.0–8.0)
Protein, ur: NEGATIVE mg/dL
SPECIFIC GRAVITY, URINE: 1.012 (ref 1.005–1.030)

## 2017-03-17 NOTE — Progress Notes (Signed)
[name], MD [address]  [place]  [place], [place] [address]  Main: [phone]  Fax: [phone]    Gastroenterology Consultation  Referring Provider:     Hubbard Hartshorn, FNP Primary Care Physician:  [name], MD Primary Gastroenterologist:  Dr. [name] Reason for Consultation:   Dyspepsia        HPI:   David Skinner is a 66 y.o. [place] male referred by Dr. [name], MD  for consultation & management of dyspepsia. He reports feeling full after eating, gassy, bloated and abdominal discomfort, mostly in the upper abdomen and sometimes in the mid lower abdomen. He saw Dr. Ancil Boozer about 2 weeks ago for these symptoms and irregular bowel movements, he was prescribed MiraLAX daily. He reports that he does not have these symptoms anymore. These episodes occur every couple of months and lasts for about 10-14 days. It happened about 3-4 times within last 1 year. He denies nausea, vomiting, weight loss, rectal bleeding, fever, chills. He denies any heartburn or dysphagia. He was an ex-smoker and ex-alcohol. He does have mildly elevated ALT since 2016 and ultrasound revealed fatty liver. Unknown hepatitis B or C status. He denies taking NSAIDs. Denies any family history of gastric cancer or other GI malignancy No known liver disease in the family or known hepatitis B in the family Denies having any abdominal surgeries Of note, he reports having frequent urination both day and night, urge, incomplete emptying. Is on Flomax. He denies dysuria.   GI Procedures:  EGD none Colonoscopy 2012 at [facility] A: Colon, ascending, biopsy -Adenomatous polyp (1 fragment) -Lymphoid nodule (1 fragment) -Unremarkable colonic mucosa (2 fragments)  Past Medical History:  Diagnosis Date  . Allergy   . Anxiety   . Atopy   . Chronic constipation   . Elevated LFTs   . Fatty liver   . GERD (gastroesophageal reflux disease)   . Hyperlipidemia   . Hypertension   .  Hypoglycemia   . Insomnia   . Memory change   . Overweight   . Paresthesia of hand   . Seizure ([place])   . Tenosynovitis of finger   . Tinea cruris   . Vitamin D deficiency     Past Surgical History:  Procedure Laterality Date  . LASIK Bilateral     Prior to Admission medications   Medication Sig Start Date End Date Taking? Authorizing Provider  aspirin EC 81 MG tablet Take 1 tablet (81 mg total) by mouth daily. 06/15/15  Yes Sowles, Drue Stager, MD  atorvastatin (LIPITOR) 40 MG tablet Take 1 tablet (40 mg total) by mouth daily. 12/09/16  Yes Sowles, Drue Stager, MD  baclofen (LIORESAL) 20 MG tablet Take 1 tablet (20 mg total) by mouth at bedtime as needed for muscle spasms. 02/05/17  Yes Hubbard Hartshorn, FNP  DULoxetine (CYMBALTA) 60 MG capsule Take 1 capsule (60 mg total) by mouth daily. Start at 30 mg for the first week, after that 2 daily 12/09/16  Yes Sowles, Drue Stager, MD  fluticasone [facility]) 50 MCG/ACT nasal spray instill 2 sprays into each nostril at bedtime if needed 12/09/16  Yes Sowles, Drue Stager, MD  ketoconazole (NIZORAL) 2 % cream Apply 1 application topically 2 (two) times daily. 12/09/16  Yes Sowles, Drue Stager, MD  levETIRAcetam (KEPPRA) 250 MG tablet Take 1 tablet (250 mg total) by mouth 2 (two) times daily. 02/27/16  Yes Eula Listen, MD  loratadine (CLARITIN) 10 MG tablet Take 1 tablet (10 mg total) by mouth  2 (two) times daily. 12/09/16  Yes Sowles, Drue Stager, MD  meloxicam (MOBIC) 15 MG tablet Take 1 tablet by mouth daily. 04/04/16  Yes Earnestine Leys, MD  metFORMIN (GLUCOPHAGE-XR) 750 MG 24 hr tablet take 1 tablet by mouth once daily WITH BREAKFAST 10/04/16  Yes Sowles, Drue Stager, MD  montelukast (SINGULAIR) 10 MG tablet Take 1 tablet (10 mg total) by mouth daily. 12/09/16  Yes Sowles, Drue Stager, MD  polyethylene glycol powder (GLYCOLAX/MIRALAX) powder Take 17 g by mouth daily. 03/03/17  Yes Sowles, Drue Stager, MD  tamsulosin (FLOMAX) 0.4 MG CAPS capsule Take 1 capsule (0.4 mg total) by  mouth daily. 02/05/17  Yes Hubbard Hartshorn, FNP    Family History  Problem Relation Age of Onset  . Family history unknown: Yes     Social History  Substance Use Topics  . Smoking status: Former Smoker    Packs/day: 1.00    Years: 15.00    Types: Cigarettes    Start date: 06/17/1984    Quit date: 11/28/1999  . Smokeless tobacco: Never Used  . Alcohol use No     Comment: used to drink a pack on weekends.     Allergies as of 03/17/2017 - Review Complete 03/17/2017  Allergen Reaction Noted  . Ibuprofen Itching 11/28/2014  . Levofloxacin  11/28/2014  . Lisinopril Cough 11/28/2014  . Metoprolol  11/28/2014  . Nsaids Itching 03/08/2015  . Tramadol hcl  11/28/2014    Review of Systems:    All systems reviewed and negative except where noted in HPI.   Physical Exam:  BP 138/90   Pulse (!) 108   Temp 98.3 F (36.8 C) (Oral)   Ht 5\' 5"  (1.651 m)   Wt 159 lb (72.1 kg)   BMI 26.46 kg/m  No LMP for male patient.  General:   Alert,  Well-developed, well-nourished, pleasant and cooperative in NAD Head:  Normocephalic and atraumatic. Eyes:  Sclera clear, no icterus.   Conjunctiva pink. Ears:  Normal auditory acuity. Nose:  No deformity, discharge, or lesions. Mouth:  No deformity or lesions,oropharynx pink & moist. Neck:  Supple; no masses or thyromegaly. Lungs:  Respirations even and unlabored.  Clear throughout to auscultation.   No wheezes, crackles, or rhonchi. No acute distress. Heart:  Regular rate and rhythm; no murmurs, clicks, rubs, or gallops. Abdomen:  Normal bowel sounds.  No bruits.  Soft, non-tender and non-distended without masses, hepatosplenomegaly or hernias noted.  No guarding or rebound tenderness.   Rectal: Nor performed Msk:  Symmetrical without gross deformities. Good, equal movement & strength bilaterally. Pulses:  Normal pulses noted. Extremities:  No clubbing or edema.  No cyanosis. Neurologic:  Alert and oriented x3;  grossly normal  neurologically. Skin:  Intact without significant lesions or rashes. No jaundice. Lymph Nodes:  No significant cervical adenopathy. Psych:  Alert and cooperative. Normal mood and affect.  Imaging Studies: Reviewed  Assessment and Plan:   David Skinner is a 66 y.o. [place] male with intermittent episodes of dyspepsia with no constitutional symptoms. In his ethnicity, I would like to perform EGD and rule out H. pylori infection or any gastric cancer. He is also due for colonoscopy given prior history of tubular adenoma in 2012. He has mildly elevated ALT most likely secondary to fatty liver. Given his ethnicity, I would like to check acute hepatitis panel  We'll also check urinalysis for his urinary symptoms and let Dr. Ancil Boozer know  Follow up in 3 months   [name], MD

## 2017-03-18 ENCOUNTER — Encounter: Payer: Self-pay | Admitting: *Deleted

## 2017-03-18 LAB — HEPATITIS PANEL, ACUTE
HEP A IGM: NEGATIVE
HEP B C IGM: NEGATIVE
HEP B S AG: NEGATIVE

## 2017-03-19 ENCOUNTER — Other Ambulatory Visit: Payer: Self-pay

## 2017-03-19 DIAGNOSIS — Z1211 Encounter for screening for malignant neoplasm of colon: Secondary | ICD-10-CM

## 2017-03-19 DIAGNOSIS — R1013 Epigastric pain: Secondary | ICD-10-CM

## 2017-03-20 NOTE — Discharge Instructions (Signed)
General Anesthesia, Adult, Care After °These instructions provide you with information about caring for yourself after your procedure. Your health care provider may also give you more specific instructions. Your treatment has been planned according to current medical practices, but problems sometimes occur. Call your health care provider if you have any problems or questions after your procedure. °What can I expect after the procedure? °After the procedure, it is common to have: °· Vomiting. °· A sore throat. °· Mental slowness. ° °It is common to feel: °· Nauseous. °· Cold or shivery. °· Sleepy. °· Tired. °· Sore or achy, even in parts of your body where you did not have surgery. ° °Follow these instructions at home: °For at least 24 hours after the procedure: °· Do not: °? Participate in activities where you could fall or become injured. °? Drive. °? Use heavy machinery. °? Drink alcohol. °? Take sleeping pills or medicines that cause drowsiness. °? Make important decisions or sign legal documents. °? Take care of children on your own. °· Rest. °Eating and drinking °· If you vomit, drink water, juice, or soup when you can drink without vomiting. °· Drink enough fluid to keep your urine clear or pale yellow. °· Make sure you have little or no nausea before eating solid foods. °· Follow the diet recommended by your health care provider. °General instructions °· Have a responsible adult stay with you until you are awake and alert. °· Return to your normal activities as told by your health care provider. Ask your health care provider what activities are safe for you. °· Take over-the-counter and prescription medicines only as told by your health care provider. °· If you smoke, do not smoke without supervision. °· Keep all follow-up visits as told by your health care provider. This is important. °Contact a health care provider if: °· You continue to have nausea or vomiting at home, and medicines are not helpful. °· You  cannot drink fluids or start eating again. °· You cannot urinate after 8-12 hours. °· You develop a skin rash. °· You have fever. °· You have increasing redness at the site of your procedure. °Get help right away if: °· You have difficulty breathing. °· You have chest pain. °· You have unexpected bleeding. °· You feel that you are having a life-threatening or urgent problem. °This information is not intended to replace advice given to you by your health care provider. Make sure you discuss any questions you have with your health care provider. °Document Released: 09/09/2000 Document Revised: 11/06/2015 Document Reviewed: 05/18/2015 °Elsevier Interactive Patient Education © 2018 [employer] Inc[employer] ° °

## 2017-03-24 ENCOUNTER — Ambulatory Visit (INDEPENDENT_AMBULATORY_CARE_PROVIDER_SITE_OTHER): Payer: BLUE CROSS/BLUE SHIELD

## 2017-03-24 DIAGNOSIS — Z23 Encounter for immunization: Secondary | ICD-10-CM

## 2017-03-24 MED ORDER — HEPATITIS B VAC RECOMBINANT 10 MCG/ML IJ SUSP: 1.0000 mL | Freq: Once | INTRAMUSCULAR | Status: DC

## 2017-03-25 ENCOUNTER — Ambulatory Visit: Payer: BLUE CROSS/BLUE SHIELD | Admitting: Anesthesiology

## 2017-03-25 ENCOUNTER — Ambulatory Visit
Admission: RE | Admit: 2017-03-25 | Discharge: 2017-03-25 | Disposition: A | Discharge status: Home / Self Care | Payer: BLUE CROSS/BLUE SHIELD | Source: Ambulatory Visit | Attending: Gastroenterology | Admitting: Gastroenterology

## 2017-03-25 ENCOUNTER — Encounter
Admission: RE | Disposition: A | Discharge status: Home / Self Care | Payer: Self-pay | Source: Ambulatory Visit | Attending: Gastroenterology

## 2017-03-25 DIAGNOSIS — I10 Essential (primary) hypertension: Secondary | ICD-10-CM | POA: Diagnosis not present

## 2017-03-25 DIAGNOSIS — R1013 Epigastric pain: Secondary | ICD-10-CM | POA: Diagnosis not present

## 2017-03-25 DIAGNOSIS — K219 Gastro-esophageal reflux disease without esophagitis: Secondary | ICD-10-CM | POA: Diagnosis not present

## 2017-03-25 DIAGNOSIS — G4730 Sleep apnea, unspecified: Secondary | ICD-10-CM | POA: Insufficient documentation

## 2017-03-25 DIAGNOSIS — M1990 Unspecified osteoarthritis, unspecified site: Secondary | ICD-10-CM | POA: Insufficient documentation

## 2017-03-25 DIAGNOSIS — Z881 Allergy status to other antibiotic agents status: Secondary | ICD-10-CM | POA: Insufficient documentation

## 2017-03-25 DIAGNOSIS — Z7983 Long term (current) use of bisphosphonates: Secondary | ICD-10-CM | POA: Diagnosis not present

## 2017-03-25 DIAGNOSIS — K3189 Other diseases of stomach and duodenum: Secondary | ICD-10-CM | POA: Diagnosis not present

## 2017-03-25 DIAGNOSIS — Z886 Allergy status to analgesic agent status: Secondary | ICD-10-CM | POA: Diagnosis not present

## 2017-03-25 DIAGNOSIS — E119 Type 2 diabetes mellitus without complications: Secondary | ICD-10-CM | POA: Insufficient documentation

## 2017-03-25 DIAGNOSIS — Z86010 Personal history of colonic polyps: Secondary | ICD-10-CM | POA: Diagnosis not present

## 2017-03-25 DIAGNOSIS — K5730 Diverticulosis of large intestine without perforation or abscess without bleeding: Secondary | ICD-10-CM | POA: Insufficient documentation

## 2017-03-25 DIAGNOSIS — Z87891 Personal history of nicotine dependence: Secondary | ICD-10-CM | POA: Insufficient documentation

## 2017-03-25 DIAGNOSIS — K228 Other specified diseases of esophagus: Secondary | ICD-10-CM | POA: Insufficient documentation

## 2017-03-25 DIAGNOSIS — Z7984 Long term (current) use of oral hypoglycemic drugs: Secondary | ICD-10-CM | POA: Diagnosis not present

## 2017-03-25 DIAGNOSIS — R3589 Other polyuria: Secondary | ICD-10-CM

## 2017-03-25 DIAGNOSIS — Z1211 Encounter for screening for malignant neoplasm of colon: Secondary | ICD-10-CM | POA: Diagnosis present

## 2017-03-25 DIAGNOSIS — F329 Major depressive disorder, single episode, unspecified: Secondary | ICD-10-CM | POA: Diagnosis not present

## 2017-03-25 DIAGNOSIS — F419 Anxiety disorder, unspecified: Secondary | ICD-10-CM | POA: Diagnosis not present

## 2017-03-25 DIAGNOSIS — Z79899 Other long term (current) drug therapy: Secondary | ICD-10-CM | POA: Insufficient documentation

## 2017-03-25 DIAGNOSIS — E785 Hyperlipidemia, unspecified: Secondary | ICD-10-CM | POA: Diagnosis not present

## 2017-03-25 DIAGNOSIS — R358 Other polyuria: Secondary | ICD-10-CM

## 2017-03-25 HISTORY — PX: COLONOSCOPY WITH PROPOFOL: SHX5780

## 2017-03-25 HISTORY — PX: ESOPHAGOGASTRODUODENOSCOPY: SHX5428

## 2017-03-25 LAB — GLUCOSE, CAPILLARY
GLUCOSE-CAPILLARY: 111 mg/dL — AB (ref 65–99)
Glucose-Capillary: 121 mg/dL — ABNORMAL HIGH (ref 65–99)

## 2017-03-25 SURGERY — COLONOSCOPY WITH PROPOFOL
Anesthesia: General | Wound class: Dirty or Infected

## 2017-03-25 MED ORDER — ACETAMINOPHEN 160 MG/5ML PO SOLN: 325.0000 mg | ORAL | Status: DC | PRN

## 2017-03-25 MED ORDER — ACETAMINOPHEN 325 MG PO TABS
325.0000 mg | ORAL_TABLET | ORAL | Status: DC | PRN
  Administered 2017-03-25: 650 mg via ORAL

## 2017-03-25 MED ORDER — LIDOCAINE HCL (CARDIAC) 20 MG/ML IV SOLN
INTRAVENOUS | Status: DC | PRN
  Administered 2017-03-25: 40 mg via INTRAVENOUS

## 2017-03-25 MED ORDER — PROPOFOL 10 MG/ML IV BOLUS
INTRAVENOUS | Status: DC | PRN
  Administered 2017-03-25 (×6): 20 mg via INTRAVENOUS
  Administered 2017-03-25: 80 mg via INTRAVENOUS
  Administered 2017-03-25 (×9): 20 mg via INTRAVENOUS

## 2017-03-25 MED ORDER — GLYCOPYRROLATE 0.2 MG/ML IJ SOLN
INTRAMUSCULAR | Status: DC | PRN
  Administered 2017-03-25: 0.2 mg via INTRAVENOUS

## 2017-03-25 MED ORDER — LACTATED RINGERS IV SOLN
10.0000 mL/h | INTRAVENOUS | Status: DC
  Administered 2017-03-25: 10 mL/h via INTRAVENOUS

## 2017-03-25 MED ORDER — SODIUM CHLORIDE 0.9 % IV SOLN: INTRAVENOUS | Status: DC

## 2017-03-25 MED ORDER — ONDANSETRON HCL 4 MG/2ML IJ SOLN: 4.0000 mg | Freq: Once | INTRAMUSCULAR | Status: DC | PRN

## 2017-03-25 SURGICAL SUPPLY — 23 items

## 2017-03-25 NOTE — Anesthesia Procedure Notes (Signed)
Performed by: Tiarra Anastacio Pre-anesthesia Checklist: Patient identified, Emergency Drugs available, Suction available, Timeout performed and Patient being monitored Patient Re-evaluated:Patient Re-evaluated prior to induction Oxygen Delivery Method: Nasal cannula Placement Confirmation: positive ETCO2       

## 2017-03-25 NOTE — Anesthesia Postprocedure Evaluation (Signed)
Anesthesia Post Note  Patient: David Skinner  Procedure(s) Performed: COLONOSCOPY WITH PROPOFOL (N/A ) ESOPHAGOGASTRODUODENOSCOPY (EGD) (N/A )  Patient location during evaluation: PACU Anesthesia Type: General Level of consciousness: awake Pain management: pain level controlled Vital Signs Assessment: post-procedure vital signs reviewed and stable Respiratory status: spontaneous breathing Cardiovascular status: blood pressure returned to baseline Postop Assessment: no headache Anesthetic complications: no    Lavonna Monarch

## 2017-03-25 NOTE — H&P (Signed)
David Darby, MD [address]  [place]  [place], [place] [address]  Main: [phone]  Fax: [phone] Pager: [phone]  Primary Care Physician:  [name], MD Primary Gastroenterologist:  Dr. Cephas Skinner  Pre-Procedure History & Physical: HPI:  David Skinner is a 66 y.o. male is here for an endoscopy and colonoscopy.   Past Medical History:  Diagnosis Date  . Allergy   . Anxiety   . Atopy   . Chronic constipation   . Elevated LFTs   . Fatty liver   . GERD (gastroesophageal reflux disease)   . Hyperlipidemia   . Hypertension   . Hypoglycemia   . Insomnia   . Memory change   . Overweight   . Paresthesia of hand   . Seizure ([place])    12/12, 09/27/14, 02/28/16  . Tenosynovitis of finger   . Tinea cruris   . Vitamin D deficiency     Past Surgical History:  Procedure Laterality Date  . LASIK Bilateral     Prior to Admission medications   Medication Sig Start Date End Date Taking? Authorizing Provider  atorvastatin (LIPITOR) 40 MG tablet Take 1 tablet (40 mg total) by mouth daily. 12/09/16  Yes Sowles, Drue Stager, MD  baclofen (LIORESAL) 20 MG tablet Take 1 tablet (20 mg total) by mouth at bedtime as needed for muscle spasms. 02/05/17  Yes Hubbard Hartshorn, FNP  DULoxetine (CYMBALTA) 60 MG capsule Take 1 capsule (60 mg total) by mouth daily. Start at 30 mg for the first week, after that 2 daily 12/09/16  Yes Sowles, Drue Stager, MD  fluticasone [facility] 50 MCG/ACT nasal spray instill 2 sprays into each nostril at bedtime if needed 12/09/16  Yes Sowles, Drue Stager, MD  ketoconazole (NIZORAL) 2 % cream Apply 1 application topically 2 (two) times daily. 12/09/16  Yes Sowles, Drue Stager, MD  levETIRAcetam (KEPPRA) 250 MG tablet Take 1 tablet (250 mg total) by mouth 2 (two) times daily. 02/27/16  Yes Eula Listen, MD  loratadine (CLARITIN) 10 MG tablet Take 1 tablet (10 mg total) by mouth 2 (two) times daily. 12/09/16  Yes Sowles, Drue Stager, MD  meloxicam (MOBIC) 15  MG tablet Take 1 tablet by mouth daily. 04/04/16  Yes Earnestine Leys, MD  metFORMIN (GLUCOPHAGE-XR) 750 MG 24 hr tablet take 1 tablet by mouth once daily WITH BREAKFAST 10/04/16  Yes Sowles, Drue Stager, MD  montelukast (SINGULAIR) 10 MG tablet Take 1 tablet (10 mg total) by mouth daily. 12/09/16  Yes Sowles, Drue Stager, MD  tamsulosin (FLOMAX) 0.4 MG CAPS capsule Take 1 capsule (0.4 mg total) by mouth daily. 02/05/17  Yes Hubbard Hartshorn, FNP  aspirin EC 81 MG tablet Take 1 tablet (81 mg total) by mouth daily. 06/15/15   [name], MD  polyethylene glycol powder (GLYCOLAX/MIRALAX) powder Take 17 g by mouth daily. 03/03/17   [name], MD    Allergies as of 03/17/2017 - Review Complete 03/17/2017  Allergen Reaction Noted  . Ibuprofen Itching 11/28/2014  . Levofloxacin  11/28/2014  . Lisinopril Cough 11/28/2014  . Metoprolol  11/28/2014  . Nsaids Itching 03/08/2015  . Tramadol hcl  11/28/2014    Family History  Problem Relation Age of Onset  . Family history unknown: Yes    Social History   Social History  . Marital status: Single    Spouse name: N/A  . Number of children: N/A  . Years of education: N/A   Occupational History  . nail technician    Social History Main  Topics  . Smoking status: Former Smoker    Packs/day: 1.00    Years: 15.00    Types: Cigarettes    Start date: 06/17/1984    Quit date: 11/28/1999  . Smokeless tobacco: Never Used  . Alcohol use No     Comment: used to drink a pack on weekends.   . Drug use: No  . Sexual activity: Yes    Partners: Female   Other Topics Concern  . 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 Neck:  Supple; no masses or thyromegaly. Lungs:  Clear throughout to auscultation.    Heart:  Regular rate and rhythm. Abdomen:  Soft, nontender and nondistended. Normal bowel sounds, without guarding, and without rebound.   Neurologic:  Alert and  oriented x4;  grossly normal neurologically.  Impression/Plan: David Skinner is here for an endoscopy and colonoscopy to be performed for - EGD to rule out H. pylori infection or any gastric cancer. He is also due for colonoscopy given prior history of tubular adenoma in 2012.  Risks, benefits, limitations, and alternatives regarding  endoscopy and colonoscopy have been reviewed with the patient.  Questions have been answered.  All parties agreeable.   Sherri Sear, MD  03/25/2017, 7:31 AM

## 2017-03-25 NOTE — Anesthesia Preprocedure Evaluation (Addendum)
Anesthesia Evaluation  Patient identified by MRN, date of birth, ID band  Reviewed: Allergy & Precautions, NPO status , Patient's Chart, lab work & pertinent test results, reviewed documented beta blocker date and time   Airway Mallampati: II  TM Distance: >3 FB Neck ROM: Full    Dental no notable dental hx.    Pulmonary sleep apnea , former smoker,    Pulmonary exam normal breath sounds clear to auscultation       Cardiovascular hypertension, Normal cardiovascular exam Rhythm:Regular Rate:Normal     Neuro/Psych Seizures -,  PSYCHIATRIC DISORDERS Anxiety Depression Prior h/o alcoholism Neuromuscular disease    GI/Hepatic Neg liver ROS, GERD  ,  Endo/Other  diabetes  Renal/GU negative Renal ROS     Musculoskeletal  (+) Arthritis ,   Abdominal Normal abdominal exam  (+)  Abdomen: soft.    Peds  Hematology negative hematology ROS (+)   Anesthesia Other Findings   Reproductive/Obstetrics                            Anesthesia Physical Anesthesia Plan  ASA: II  Anesthesia Plan: General   Post-op Pain Management:    Induction: Intravenous  PONV Risk Score and Plan:   Airway Management Planned: Natural Airway  Additional Equipment: None  Intra-op Plan:   Post-operative Plan:   Informed Consent: I have reviewed the patients History and Physical, chart, labs and discussed the procedure including the risks, benefits and alternatives for the proposed anesthesia with the patient or authorized representative who has indicated his/her understanding and acceptance.     Plan Discussed with: CRNA, Anesthesiologist and Surgeon  Anesthesia Plan Comments:         Anesthesia Quick Evaluation

## 2017-03-25 NOTE — Transfer of Care (Signed)
Immediate Anesthesia Transfer of Care Note  Patient: David Skinner  Procedure(s) Performed: COLONOSCOPY WITH PROPOFOL (N/A ) ESOPHAGOGASTRODUODENOSCOPY (EGD) (N/A )  Patient Location: PACU  Anesthesia Type: General  Level of Consciousness: awake, alert  and patient cooperative  Airway and Oxygen Therapy: Patient Spontanous Breathing and Patient connected to supplemental oxygen  Post-op Assessment: Post-op Vital signs reviewed, Patient's Cardiovascular Status Stable, Respiratory Function Stable, Patent Airway and No signs of Nausea or vomiting  Post-op Vital Signs: Reviewed and stable  Complications: No apparent anesthesia complications

## 2017-03-25 NOTE — Pre-Procedure Instructions (Signed)
Pictures not captured on provation due to machine mal-function. Images scanned into pt's chart via [employer]. Refer to findings under procedure report in [place], [place] [address]  [place]  [place], [place] [address]  Main: [phone]  Fax: [phone] Pager: [phone]

## 2017-03-25 NOTE — Op Note (Addendum)
[facility] Gastroenterology Patient Name: David Skinner Procedure Date: 03/25/2017 10:07 AM MRN: [id number] Account #: [id number] Date of Birth: October 14, 1950 Admit Type: Outpatient Age: 66 Room: [facility] OR ROOM 01 Gender: Male Note Status: Finalized Procedure:            Colonoscopy Indications:          Screening for colorectal malignant neoplasm, High risk                        colon cancer surveillance: Personal history of adenoma                        less than 10 mm in size, Last colonoscopy: December 2008 Providers:            Lin Landsman MD, MD Medicines:            Monitored Anesthesia Care Complications:        No immediate complications. Estimated blood loss: None. Procedure:            Pre-Anesthesia Assessment:                       - Prior to the procedure, a History and Physical was                        performed, and patient medications and allergies were                        reviewed. The patient is competent. The risks and                        benefits of the procedure and the sedation options and                        risks were discussed with the patient. All questions                        were answered and informed consent was obtained.                        Patient identification and proposed procedure were                        verified by the physician, the nurse, the                        anesthesiologist, the anesthetist and the technician in                        the pre-procedure area in the procedure room. Mental                        Status Examination: alert and oriented. Airway                        Examination: normal oropharyngeal airway and neck                        mobility. Respiratory Examination: clear to  auscultation. CV Examination: normal. Prophylactic                        Antibiotics: The patient does not require prophylactic                        antibiotics. Prior  Anticoagulants: The patient has                        taken no previous anticoagulant or antiplatelet agents.                        ASA Grade Assessment: II - A patient with mild systemic                        disease. After reviewing the risks and benefits, the                        patient was deemed in satisfactory condition to undergo                        the procedure. The anesthesia plan was to use monitored                        anesthesia care (MAC). Immediately prior to                        administration of medications, the patient was                        re-assessed for adequacy to receive sedatives. The                        heart rate, respiratory rate, oxygen saturations, blood                        pressure, adequacy of pulmonary ventilation, and                        response to care were monitored throughout the                        procedure. The physical status of the patient was                        re-assessed after the procedure.                       After obtaining informed consent, the colonoscope was                        passed under direct vision. Throughout the procedure,                        the patient's blood pressure, pulse, and oxygen                        saturations were monitored continuously. The Olympus                        190 Colonoscope (  Q#[id number]) was introduced through the                        anus and advanced to the the terminal ileum. The                        colonoscopy was performed without difficulty. The                        patient tolerated the procedure well. The quality of                        the bowel preparation was evaluated using the BBPS                        [facility] Bowel Preparation Scale) with scores of: Right                        Colon = 3, Transverse Colon = 3 and Left Colon = 3                        (entire mucosa seen well with no residual staining,                        small fragments  of stool or opaque liquid). The total                        BBPS score equals 9. Findings:      Images not captured due to machine malfunction. Pictures recorded in       [place] to pt's chart      The perianal and digital rectal examinations were normal. Pertinent       negatives include normal sphincter tone and no palpable rectal lesions.      The terminal ileum appeared normal.      Multiple medium-mouthed diverticula were found in the sigmoid colon,       ascending colon and cecum.      The retroflexed view of the distal rectum and anal verge was normal and       showed no anal or rectal abnormalities.      The exam was otherwise without abnormality. Impression:           - The examined portion of the ileum was normal.                       - Diverticulosis in the sigmoid colon, in the ascending                        colon and in the cecum.                       - The distal rectum and anal verge are normal on                        retroflexion view.                       - The examination was otherwise normal.                       -  No specimens collected. Recommendation:       - Discharge patient to home.                       - Resume previous diet today.                       - Continue present medications.                       - Repeat colonoscopy in 5 years for surveillance.                       - Return to my office as previously scheduled. Procedure Code(s):    --- Professional ---                       Z0258, Colorectal cancer screening; colonoscopy on                        individual at high risk Diagnosis Code(s):    --- Professional ---                       Z12.11, Encounter for screening for malignant neoplasm                        of colon                       K57.30, Diverticulosis of large intestine without                        perforation or abscess without bleeding                       Z86.010, Personal history of colonic polyps CPT copyright 2016  American Medical Association. All rights reserved. The codes documented in this report are preliminary and upon coder review may  be revised to meet current compliance requirements. Dr. Ulyess Mort Lin Landsman MD, MD 03/25/2017 10:49:58 AM This report has been signed electronically. Number of Addenda: 0 Note Initiated On: 03/25/2017 10:07 AM Scope Withdrawal Time: 0 hours 14 minutes 9 seconds  Total Procedure Duration: 0 hours 16 minutes 22 seconds       [facility]

## 2017-03-25 NOTE — Op Note (Addendum)
[facility] Gastroenterology Patient Name: David Skinner Procedure Date: 03/25/2017 9:45 AM MRN: [id number] Account #: [id number] Date of Birth: Dec 16, 1950 Admit Type: Outpatient Age: 66 Room: [facility] OR ROOM 01 Gender: Male Note Status: Finalized Procedure:            Upper GI endoscopy Indications:          Epigastric abdominal pain, Abdominal bloating Providers:            Lin Landsman MD, MD Referring MD:         Bethena Roys. Sowles, MD (Referring MD) Medicines:            Monitored Anesthesia Care Complications:        No immediate complications. Estimated blood loss:                        Minimal. Procedure:            Pre-Anesthesia Assessment:                       - Prior to the procedure, a History and Physical was                        performed, and patient medications and allergies were                        reviewed. The patient is competent. The risks and                        benefits of the procedure and the sedation options and                        risks were discussed with the patient. All questions                        were answered and informed consent was obtained.                        Patient identification and proposed procedure were                        verified by the physician, the nurse, the                        anesthesiologist, the anesthetist and the technician in                        the pre-procedure area in the procedure room. Mental                        Status Examination: alert and oriented. Airway                        Examination: normal oropharyngeal airway and neck                        mobility. Respiratory Examination: clear to                        auscultation. CV Examination: normal. Prophylactic  Antibiotics: The patient does not require prophylactic                        antibiotics. Prior Anticoagulants: The patient has                        taken no previous anticoagulant  or antiplatelet agents.                        ASA Grade Assessment: II - A patient with mild systemic                        disease. After reviewing the risks and benefits, the                        patient was deemed in satisfactory condition to undergo                        the procedure. The anesthesia plan was to use monitored                        anesthesia care (MAC). Immediately prior to                        administration of medications, the patient was                        re-assessed for adequacy to receive sedatives. The                        heart rate, respiratory rate, oxygen saturations, blood                        pressure, adequacy of pulmonary ventilation, and                        response to care were monitored throughout the                        procedure. The physical status of the patient was                        re-assessed after the procedure.                       After obtaining informed consent, the endoscope was                        passed under direct vision. Throughout the procedure,                        the patient's blood pressure, pulse, and oxygen                        saturations were monitored continuously. The Olympus                        GIF H180J Endoscope (S#: [id number]) was introduced  through the mouth, and advanced to the third part of                        duodenum. The upper GI endoscopy was accomplished                        without difficulty. The patient tolerated the procedure                        well. Findings:      Images not captured due to machine malfunction. Pictures recorded in       [place] to pt's chart      The duodenal bulb and second portion of the duodenum were normal.      Multiple dispersed, small non-bleeding erosions were found in the       gastric antrum. There were no stigmata of recent bleeding.      Diffuse mildly erythematous mucosa without bleeding was found in the        gastric antrum.      The cardia, gastric fundus and gastric body were normal. Biopsies were       taken with a cold forceps for Helicobacter pylori testing.      The gastroesophageal junction and examined esophagus were normal.      Localized mild erythema was found in the lower third of the esophagus. Impression:           - Normal duodenal bulb and second portion of the                        duodenum.                       - Non-bleeding erosive gastropathy.                       - Erythematous mucosa in the antrum.                       - Normal cardia, gastric fundus and gastric body.                        Biopsied.                       - Normal gastroesophageal junction and esophagus.                       - Erythema in the lower third of the esophagus. Recommendation:       - Await pathology results.                       - Proceed with screening colonoscopy as scheduled Procedure Code(s):    --- Professional ---                       [id number], Esophagogastroduodenoscopy, flexible, transoral;                        with biopsy, single or multiple Diagnosis Code(s):    --- Professional ---                       K31.89, Other diseases of stomach and duodenum  K22.8, Other specified diseases of esophagus                       R10.13, Epigastric pain                       R14.0, Abdominal distension (gaseous) CPT copyright 2016 American Medical Association. All rights reserved. The codes documented in this report are preliminary and upon coder review may  be revised to meet current compliance requirements. Dr. Ulyess Mort Lin Landsman MD, MD 03/25/2017 10:27:01 AM This report has been signed electronically. Number of Addenda: 0 Note Initiated On: 03/25/2017 9:45 AM      [facility]

## 2017-03-26 ENCOUNTER — Encounter: Payer: Self-pay | Admitting: Gastroenterology

## 2017-03-28 ENCOUNTER — Encounter: Payer: Self-pay | Admitting: Gastroenterology

## 2017-04-10 ENCOUNTER — Encounter: Payer: Self-pay | Admitting: Family Medicine

## 2017-04-10 ENCOUNTER — Ambulatory Visit (INDEPENDENT_AMBULATORY_CARE_PROVIDER_SITE_OTHER): Payer: BLUE CROSS/BLUE SHIELD | Admitting: Family Medicine

## 2017-04-10 VITALS — BP 130/76 | HR 101 | Temp 97.6°F | Resp 16 | Ht 65.35 in | Wt 161.8 lb

## 2017-04-10 DIAGNOSIS — Z0001 Encounter for general adult medical examination with abnormal findings: Secondary | ICD-10-CM

## 2017-04-10 DIAGNOSIS — E1140 Type 2 diabetes mellitus with diabetic neuropathy, unspecified: Secondary | ICD-10-CM

## 2017-04-10 DIAGNOSIS — N521 Erectile dysfunction due to diseases classified elsewhere: Secondary | ICD-10-CM

## 2017-04-10 DIAGNOSIS — Z23 Encounter for immunization: Secondary | ICD-10-CM

## 2017-04-10 DIAGNOSIS — R399 Unspecified symptoms and signs involving the genitourinary system: Secondary | ICD-10-CM

## 2017-04-10 DIAGNOSIS — Z0000 Encounter for general adult medical examination without abnormal findings: Secondary | ICD-10-CM

## 2017-04-10 LAB — POCT UA - MICROALBUMIN: MICROALBUMIN (UR) POC: 20 mg/L

## 2017-04-10 NOTE — Progress Notes (Signed)
Name: David Skinner   MRN: [id number]    DOB: Dec 31, 1950   Date:04/10/2017       Progress Note  Subjective  Chief Complaint  Chief Complaint  Patient presents with  . Annual Exam    HPI  Patient presents for annual CPE and nocturia  Phone translator used during the visit ( 35 minutes)   Diet: meat, vegetables and fruit   IPSS Questionnaire (AUA-7): Over the past month.   1)  How often have you had a sensation of not emptying your bladder completely after you finish urinating?  3 - About half the time  2)  How often have you had to urinate again less than two hours after you finished urinating? 5 - Almost always  3)  How often have you found you stopped and started again several times when you urinated?  4 - More than half the time  4) How difficult have you found it to postpone urination?  5 - Almost always  5) How often have you had a weak urinary stream?  5 - Almost always  6) How often have you had to push or strain to begin urination?  0 - Not at all  7) How many times did you most typically get up to urinate from the time you went to bed until the time you got up in the morning?  5 - 5+ times  Total score:  0-7 mildly symptomatic   8-19 moderately symptomatic  27 20-35 severely symptomatic   Depression:  Depression screen [facility] 2/9 04/10/2017 12/09/2016 10/04/2016 05/28/2016 02/02/2016  Decreased Interest 0 0 3 0 0  Down, Depressed, Hopeless 0 0 0 0 0  PHQ - 2 Score 0 0 3 0 0  Altered sleeping - - 3 - -  Tired, decreased energy - - 3 - -  Change in appetite - - 3 - -  Feeling bad or failure about yourself  - - 0 - -  Trouble concentrating - - 3 - -  Moving slowly or fidgety/restless - - 0 - -  Suicidal thoughts - - 0 - -  PHQ-9 Score - - 15 - -  Difficult doing work/chores - - Very difficult - -    Hypertension: BP Readings from Last 3 Encounters:  04/10/17 130/76  03/25/17 (!) 124/100  03/17/17 138/90    Obesity: Wt Readings from Last 3 Encounters:   04/10/17 161 lb 12.8 oz (73.4 kg)  03/25/17 154 lb (69.9 kg)  03/17/17 159 lb (72.1 kg)   BMI Readings from Last 3 Encounters:  04/10/17 26.63 kg/m  03/25/17 25.63 kg/m  03/17/17 26.46 kg/m     Lipids:  Lab Results  Component Value Date   CHOL 122 05/28/2016   CHOL 183 03/08/2015   CHOL 254 (H) 11/29/2014   Lab Results  Component Value Date   HDL 44 05/28/2016   HDL 51 03/08/2015   HDL 47 11/29/2014   Lab Results  Component Value Date   LDLCALC 61 05/28/2016   LDLCALC 100 (H) 03/08/2015   LDLCALC 163 (H) 11/29/2014   Lab Results  Component Value Date   TRIG 84 05/28/2016   TRIG 160 (H) 03/08/2015   TRIG 219 (H) 11/29/2014   Lab Results  Component Value Date   CHOLHDL 2.8 05/28/2016   CHOLHDL 3.6 03/08/2015   CHOLHDL 5.4 (H) 11/29/2014   No results found for: LDLDIRECT Glucose:  Glucose, Bld  Date Value Ref Range Status  03/03/2017 76 65 - 99  mg/dL Final    Comment:    .            Fasting reference interval .   05/28/2016 94 65 - 99 mg/dL Final  02/27/2016 102 (H) 65 - 99 mg/dL Final   Glucose-Capillary  Date Value Ref Range Status  03/25/2017 111 (H) 65 - 99 mg/dL Final  03/25/2017 121 (H) 65 - 99 mg/dL Final    Alcohol: quit about 6-12 months ago  Tobacco use: quit over 15 years ago   Single HIV: 11/29/2014 hep C: 03/17/2017   Colorectal cancer: 03/25/2017 Prostate cancer: 02/05/2017  Lab Results  Component Value Date   PSA 0.4 02/05/2017   PSA 0.5 10/30/2012   Lung cancer: Low Dose CT Chest recommended if Age 66-80 years, 30 pack-year currently smoking OR have quit w/in 15years. Patient does not qualify.     Aspirin: yes ECG:  In the chart   Advanced Care Planning: A voluntary discussion about advance care planning including the explanation and discussion of advance directives.  Discussed health care proxy and Living will, and the patient was able to identify a health care proxy as girlfriend - [name] Patient does not have a  living will at present time. If patient does have living will, I have requested they bring this to the clinic to be scanned in to their chart.  Patient Active Problem List   Diagnosis Date Noted  . OSA (obstructive sleep apnea) 12/09/2016  . RLS (restless legs syndrome) 12/09/2016  . Episode of moderate major depression ([place]) 12/09/2016  . History of alcoholism ([place]) 07/22/2016  . Cervical radiculitis 01/09/2016  . Lumbosacral radiculitis 01/09/2016  . Osteoarthritis of carpometacarpal (CMC) joint of thumb 01/09/2016  . Tinea cruris 03/21/2015  . 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 Depression with anxiety 11/28/2014  . Chronic constipation 11/28/2014  . Insomnia 11/28/2014  . Dyslipidemia 11/28/2014  . Fatty infiltration of liver 11/28/2014  . Essential (primary) hypertension 11/28/2014  . Gastric reflux 11/28/2014  . Memory loss or impairment 11/28/2014  . Perennial allergic rhinitis with seasonal variation 11/28/2014  . Vitamin D deficiency 11/28/2014  . Tenosynovitis of thumb 11/28/2014  . Partial epilepsy with impairment of consciousness ([place]) 11/16/2014    Past Surgical History:  Procedure Laterality Date  . COLONOSCOPY WITH PROPOFOL N/A 03/25/2017   Procedure: COLONOSCOPY WITH PROPOFOL;  Surgeon: Lin Landsman, MD;  Location: [facility];  Service: Endoscopy;  Laterality: N/A;  . ESOPHAGOGASTRODUODENOSCOPY N/A 03/25/2017   Procedure: ESOPHAGOGASTRODUODENOSCOPY (EGD);  Surgeon: Lin Landsman, MD;  Location: [facility];  Service: Endoscopy;  Laterality: N/A;  Diabetic - oral meds  . LASIK Bilateral     Family History  Problem Relation Age of Onset  . Family history unknown: Yes    Social History   Social History  . Marital status: Single    Spouse name: N/A  . Number of children: N/A  . Years of education: N/A   Occupational History  . nail technician    Social History Main Topics   . Smoking status: Former Smoker    Packs/day: 1.00    Years: 15.00    Types: Cigarettes    Start date: 06/17/1984    Quit date: 11/28/1999  . Smokeless tobacco: Never Used  . Alcohol use No     Comment: used to drink a pack on weekends.   . Drug use: No  . Sexual activity: Yes    Partners:  Female   Other Topics Concern  . 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  aspirin EC 81 MG tablet, Take 1 tablet (81 mg total) by mouth daily., Disp: 30 tablet, Rfl: 0 .  atorvastatin (LIPITOR) 40 MG tablet, Take 1 tablet (40 mg total) by mouth daily., Disp: 90 tablet, Rfl: 1 .  DULoxetine (CYMBALTA) 60 MG capsule, Take 1 capsule (60 mg total) by mouth daily. Start at 30 mg for the first week, after that 2 daily, Disp: 90 capsule, Rfl: 1 .  fluticasone (FLONASE) 50 MCG/ACT nasal spray, instill 2 sprays into each nostril at bedtime if needed, Disp: 48 g, Rfl: 1 .  ketoconazole (NIZORAL) 2 % cream, Apply 1 application topically 2 (two) times daily., Disp: 60 g, Rfl: 2 .  loratadine (CLARITIN) 10 MG tablet, Take 1 tablet (10 mg total) by mouth 2 (two) times daily., Disp: 180 tablet, Rfl: 1 .  montelukast (SINGULAIR) 10 MG tablet, Take 1 tablet (10 mg total) by mouth daily., Disp: 90 tablet, Rfl: 1 .  tamsulosin (FLOMAX) 0.4 MG CAPS capsule, Take 1 capsule (0.4 mg total) by mouth daily., Disp: 90 capsule, Rfl: 0 .  baclofen (LIORESAL) 20 MG tablet, Take 1 tablet (20 mg total) by mouth at bedtime as needed for muscle spasms. (Patient not taking: Reported on 04/10/2017), Disp: 30 each, Rfl: 0 .  benazepril (LOTENSIN) 40 MG tablet, Take by mouth., Disp: , Rfl:  .  gabapentin (NEURONTIN) 600 MG tablet, Take by mouth., Disp: , Rfl:  .  levETIRAcetam (KEPPRA) 250 MG tablet, Take 1 tablet (250 mg total) by mouth 2 (two) times daily. (Patient not taking: Reported on 04/10/2017), Disp: 60 tablet, Rfl: 0 .  meloxicam (MOBIC) 15  MG tablet, Take 1 tablet by mouth daily., Disp: , Rfl: 0 .  metFORMIN (GLUCOPHAGE-XR) 750 MG 24 hr tablet, take 1 tablet by mouth once daily WITH BREAKFAST (Patient not taking: Reported on 04/10/2017), Disp: 90 tablet, Rfl: 1 .  polyethylene glycol powder (GLYCOLAX/MIRALAX) powder, Take 17 g by mouth daily. (Patient not taking: Reported on 04/10/2017), Disp: 3350 g, Rfl: 0  Allergies  Allergen Reactions  . Ibuprofen Itching  . Levofloxacin   . Lisinopril Cough  . Metoprolol   . Nsaids Itching  . Tolmetin Itching  . Tramadol Hcl      ROS   Constitutional: Negative for fever or weight change.  Respiratory: Negative for cough and shortness of breath.   Cardiovascular: Negative for chest pain or palpitations.  Gastrointestinal: Negative for abdominal pain, no bowel changes.  Musculoskeletal: Negative for gait problem or joint swelling.  Skin: Negative for rash.  Neurological: Negative for dizziness or headache.  No other specific complaints in a complete review of systems (except as listed in HPI above).   Objective  Vitals:   04/10/17 0950  BP: 130/76  Pulse: (!) 101  Resp: 16  Temp: 97.6 F (36.4 C)  TempSrc: Oral  SpO2: 96%  Weight: 161 lb 12.8 oz (73.4 kg)  Height: 5' 5.35" (1.66 m)    Body mass index is 26.63 kg/m.  Physical Exam  Constitutional: Patient appears well-developed and well-nourished. No distress.  HENT: Head: Normocephalic and atraumatic. Ears: B TMs ok, no erythema or effusion; Nose: Nose normal. Mouth/Throat: Oropharynx is clear and moist. No oropharyngeal exudate.  Eyes: Conjunctivae and EOM are normal. Pupils are equal, round, and reactive to  light. No scleral icterus.  Neck: Normal range of motion. Neck supple. No JVD present. No thyromegaly present.  Cardiovascular: Normal rate, regular rhythm and normal heart sounds.  No murmur heard. No BLE edema. Pulmonary/Chest: Effort normal and breath sounds normal. No respiratory distress. Abdominal:  Soft. Bowel sounds are normal, no distension. There is no tenderness. no masses MALE GENITALIA: Normal descended testes bilaterally, no masses palpated, no hernias, no lesions, no discharge RECTAL: Prostate normal size and consistency, no rectal masses or hemorrhoids  Musculoskeletal: Normal range of motion, no joint effusions. No gross deformities Neurological: he is alert and oriented to person, place, and time. No cranial nerve deficit. Coordination, balance, strength, speech and gait are normal.  Skin: Skin is warm and dry. No rash noted. No erythema.  Psychiatric: Patient has a normal mood and affect. behavior is normal. Judgment and thought content normal.  Recent Results (from the past 2160 hour(s))  POCT HgB A1C     Status: Normal   Collection Time: 02/05/17  2:28 PM  Result Value Ref Range   Hemoglobin A1C 5.9   PSA     Status: None   Collection Time: 02/05/17  3:01 PM  Result Value Ref Range   PSA 0.4 <=4.0 ng/mL    Comment:   The total PSA value from this assay system is standardized against the WHO standard. The test result will be approximately 20% lower when compared to the equimolar-standardized total PSA ([employer] Coul[employer]). Comparison of serial PSA results should be interpreted with this fact in mind.   This test was performed using the Siemens chemiluminescent method. Values obtained from different assay methods cannot be used interchangeably. PSA levels, regardless of value, should not be interpreted as absolute evidence of the presence or absence of disease.     Thyroid Panel With TSH     Status: None   Collection Time: 03/03/17  1:56 PM  Result Value Ref Range   T3 Uptake 30 22 - 35 %   T4, Total 7.4 4.9 - 10.5 mcg/dL   Free Thyroxine Index 2.2 1.4 - 3.8   TSH 0.45 0.40 - 4.50 mIU/L  COMPLETE METABOLIC PANEL WITH GFR     Status: Abnormal   Collection Time: 03/03/17  1:56 PM  Result Value Ref Range   Glucose, Bld 76 65 - 99 mg/dL    Comment: .             Fasting reference interval .    BUN 19 7 - 25 mg/dL   Creat 0.93 0.70 - 1.25 mg/dL    Comment: For patients >56 years of age, the reference limit for Creatinine is approximately 13% higher for people identified as African-American. .    GFR, Est Non African American 87 > OR = 60 mL/min/1.6m2   GFR, Est African American 101 > OR = 60 mL/min/1.57m2   BUN/Creatinine Ratio NOT APPLICABLE 6 - 22 (calc)   Sodium 139 135 - 146 mmol/L   Potassium 4.8 3.5 - 5.3 mmol/L   Chloride 103 98 - 110 mmol/L   CO2 27 20 - 32 mmol/L   Calcium 9.9 8.6 - 10.3 mg/dL   Total Protein 7.7 6.1 - 8.1 g/dL   Albumin 4.4 3.6 - 5.1 g/dL   Globulin 3.3 1.9 - 3.7 g/dL (calc)   AG Ratio 1.3 1.0 - 2.5 (calc)   Total Bilirubin 0.8 0.2 - 1.2 mg/dL   Alkaline phosphatase (APISO) 62 40 - 115 U/L   AST 30 10 - 35  U/L   ALT 48 (H) 9 - 46 U/L  CBC with Differential/Platelet     Status: Abnormal   Collection Time: 03/03/17  1:56 PM  Result Value Ref Range   WBC 6.2 3.8 - 10.8 Thousand/uL   RBC 5.52 4.20 - 5.80 Million/uL   Hemoglobin 16.8 13.2 - 17.1 g/dL   HCT 50.3 (H) 38.5 - 50.0 %   MCV 91.1 80.0 - 100.0 fL   MCH 30.4 27.0 - 33.0 pg   MCHC 33.4 32.0 - 36.0 g/dL   RDW 12.6 11.0 - 15.0 %   Platelets 260 140 - 400 Thousand/uL   MPV 9.6 7.5 - 12.5 fL   Neutro Abs 3,528 1,500 - 7,800 cells/uL   Lymphs Abs 1,668 850 - 3,900 cells/uL   WBC mixed population 738 200 - 950 cells/uL   Eosinophils Absolute 229 15 - 500 cells/uL   Basophils Absolute 37 0 - 200 cells/uL   Neutrophils Relative % 56.9 %   Total Lymphocyte 26.9 %   Monocytes Relative 11.9 %   Eosinophils Relative 3.7 %   Basophils Relative 0.6 %  Urinalysis, Routine w reflex microscopic     Status: Abnormal   Collection Time: 03/17/17  3:00 PM  Result Value Ref Range   Color, Urine YELLOW (A) YELLOW   APPearance CLEAR (A) CLEAR   Specific Gravity, Urine 1.012 1.005 - 1.030   pH 5.0 5.0 - 8.0   Glucose, UA NEGATIVE NEGATIVE mg/dL   Hgb urine  dipstick NEGATIVE NEGATIVE   Bilirubin Urine NEGATIVE NEGATIVE   Ketones, ur NEGATIVE NEGATIVE mg/dL   Protein, ur NEGATIVE NEGATIVE mg/dL   Nitrite NEGATIVE NEGATIVE   Leukocytes, UA NEGATIVE NEGATIVE  Hepatitis panel, acute     Status: None   Collection Time: 03/17/17  3:28 PM  Result Value Ref Range   Hepatitis B Surface Ag Negative Negative   HCV Ab <0.1 0.0 - 0.9 s/co ratio    Comment: (NOTE)                                  Negative:     < 0.8                             Indeterminate: 0.8 - 0.9                                  Positive:     > 0.9 The CDC recommends that a positive HCV antibody result be followed up with a HCV Nucleic Acid Amplification test (188416). Performed At: [facility] [place], [place] [id number] Lindon Romp MD UX:[id number]    Hep A IgM Negative Negative   Hep B C IgM Negative Negative  Glucose, capillary     Status: Abnormal   Collection Time: 03/25/17  7:27 AM  Result Value Ref Range   Glucose-Capillary 121 (H) 65 - 99 mg/dL  Glucose, capillary     Status: Abnormal   Collection Time: 03/25/17 10:52 AM  Result Value Ref Range   Glucose-Capillary 111 (H) 65 - 99 mg/dL      PHQ2/9: Depression screen [facility] 2/9 04/10/2017 12/09/2016 10/04/2016 05/28/2016 02/02/2016  Decreased Interest 0 0 3 0 0  Down, Depressed, Hopeless 0 0 0 0 0  PHQ - 2 Score 0  0 3 0 0  Altered sleeping - - 3 - -  Tired, decreased energy - - 3 - -  Change in appetite - - 3 - -  Feeling bad or failure about yourself  - - 0 - -  Trouble concentrating - - 3 - -  Moving slowly or fidgety/restless - - 0 - -  Suicidal thoughts - - 0 - -  PHQ-9 Score - - 15 - -  Difficult doing work/chores - - Very difficult - -     Fall Risk: Fall Risk  04/10/2017 12/09/2016 05/28/2016 02/02/2016 11/08/2015  Falls in the past year? No No Yes No No  Number falls in past yr: - - 1 - -  Injury with Fall? - - No - -    Current Exercise Habits: The patient does not  participate in regular exercise at present    Functional Status Survey: Is the patient deaf or have difficulty hearing?: No Does the patient have difficulty seeing, even when wearing glasses/contacts?: No Does the patient have difficulty concentrating, remembering, or making decisions?: No Does the patient have difficulty walking or climbing stairs?: No Does the patient have difficulty dressing or bathing?: No Does the patient have difficulty doing errands alone such as visiting a doctor's office or shopping?: No    Assessment & Plan  1. Annual physical exam  Discussed importance of 150 minutes of physical activity weekly, eat two servings of fish weekly, eat one serving of tree nuts ( cashews, pistachios, pecans, almonds.Marland Kitchen) every other day, eat 6 servings of fruit/vegetables daily and drink plenty of water and avoid sweet beverages. Return fasting for labs  2. 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 Need for immunization against influenza  - Flu Vaccine QUAD 6+ mos PF IM (Fluarix Quad PF)  4. LUTS  - Ambulatory referral to Urology Normal prostate exam, unknown cause, normal PSA   -Prostate cancer screening and PSA options (with potential risks and benefits of testing vs not testing) were discussed along with recent recs/guidelines. -USPSTF grade A and B recommendations reviewed with patient; age-appropriate recommendations, preventive care, screening tests, etc discussed and encouraged; healthy living encouraged; see AVS for patient education given to patient -Discussed importance of 150 minutes of physical activity weekly, eat two servings of fish weekly, eat one serving of tree nuts ( cashews, pistachios, pecans, almonds.Marland Kitchen) every other day, eat 6 servings of fruit/vegetables daily and drink plenty of water and avoid sweet beverages.  -Red flags and when to present for emergency care or RTC including fever  >101.31F, chest pain, shortness of breath, new/worsening/un-resolving symptoms, reviewed with patient at time of visit. Follow up and care instructions discussed and provided in AVS. -Revi

## 2017-04-10 NOTE — Patient Instructions (Signed)
Preventive Care 40-64 Years, Male Preventive care refers to lifestyle choices and visits with your health care provider that can promote health and wellness. What does preventive care include?  A yearly physical exam. This is also called an annual well check.  Dental exams once or twice a year.  Routine eye exams. Ask your health care provider how often you should have your eyes checked.  Personal lifestyle choices, including: ? Daily care of your teeth and gums. ? Regular physical activity. ? Eating a healthy diet. ? Avoiding tobacco and drug use. ? Limiting alcohol use. ? Practicing safe sex. ? Taking low-dose aspirin every day starting at age 39. What happens during an annual well check? The services and screenings done by your health care provider during your annual well check will depend on your age, overall health, lifestyle risk factors, and family history of disease. Counseling Your health care provider may ask you questions about your:  Alcohol use.  Tobacco use.  Drug use.  Emotional well-being.  Home and relationship well-being.  Sexual activity.  Eating habits.  Work and work Statistician.  Screening You may have the following tests or measurements:  Height, weight, and BMI.  Blood pressure.  Lipid and cholesterol levels. These may be checked every 5 years, or more frequently if you are over 64 years old.  Skin check.  Lung cancer screening. You may have this screening every year starting at age 64 if you have a 30-pack-year history of smoking and currently smoke or have quit within the past 15 years.  Fecal occult blood test (FOBT) of the stool. You may have this test every year starting at age 64.  Flexible sigmoidoscopy or colonoscopy. You may have a sigmoidoscopy every 5 years or a colonoscopy every 10 years starting at age 64.  Prostate cancer screening. Recommendations will vary depending on your family history and other risks.  Hepatitis C  blood test.  Hepatitis B blood test.  Sexually transmitted disease (STD) testing.  Diabetes screening. This is done by checking your blood sugar (glucose) after you have not eaten for a while (fasting). You may have this done every 1-3 years.  Discuss your test results, treatment options, and if necessary, the need for more tests with your health care provider. Vaccines Your health care provider may recommend certain vaccines, such as:  Influenza vaccine. This is recommended every year.  Tetanus, diphtheria, and acellular pertussis (Tdap, Td) vaccine. You may need a Td booster every 10 years.  Varicella vaccine. You may need this if you have not been vaccinated.  Zoster vaccine. You may need this after age 64.  Measles, mumps, and rubella (MMR) vaccine. You may need at least one dose of MMR if you were born in 1957 or later. You may also need a second dose.  Pneumococcal 13-valent conjugate (PCV13) vaccine. You may need this if you have certain conditions and have not been vaccinated.  Pneumococcal polysaccharide (PPSV23) vaccine. You may need one or two doses if you smoke cigarettes or if you have certain conditions.  Meningococcal vaccine. You may need this if you have certain conditions.  Hepatitis A vaccine. You may need this if you have certain conditions or if you travel or work in places where you may be exposed to hepatitis A.  Hepatitis B vaccine. You may need this if you have certain conditions or if you travel or work in places where you may be exposed to hepatitis B.  Haemophilus influenzae type b (Hib) vaccine.  You may need this if you have certain risk factors.  Talk to your health care provider about which screenings and vaccines you need and how often you need them. This information is not intended to replace advice given to you by your health care provider. Make sure you discuss any questions you have with your health care provider. Document Released: 06/30/2015  Document Revised: 02/21/2016 Document Reviewed: 04/04/2015 Elsevier Interactive Patient Education  2017 [employer]  

## 2017-04-11 LAB — URINE CULTURE
MICRO NUMBER:: 81196592
RESULT: NO GROWTH
SPECIMEN QUALITY:: ADEQUATE

## 2017-05-05 ENCOUNTER — Other Ambulatory Visit: Payer: Self-pay | Admitting: Family Medicine

## 2017-05-05 DIAGNOSIS — M5412 Radiculopathy, cervical region: Secondary | ICD-10-CM

## 2017-05-07 ENCOUNTER — Ambulatory Visit: Payer: BLUE CROSS/BLUE SHIELD | Admitting: Family Medicine

## 2017-05-12 ENCOUNTER — Encounter: Payer: Self-pay | Admitting: Family Medicine

## 2017-05-12 ENCOUNTER — Ambulatory Visit: Payer: BLUE CROSS/BLUE SHIELD | Admitting: Family Medicine

## 2017-05-12 VITALS — BP 102/80 | HR 106 | Resp 14 | Ht 65.0 in | Wt 163.1 lb

## 2017-05-12 DIAGNOSIS — N521 Erectile dysfunction due to diseases classified elsewhere: Secondary | ICD-10-CM

## 2017-05-12 DIAGNOSIS — G40209 Localization-related (focal) (partial) symptomatic epilepsy and epileptic syndromes with complex partial seizures, not intractable, without status epilepticus: Secondary | ICD-10-CM | POA: Diagnosis not present

## 2017-05-12 DIAGNOSIS — E1140 Type 2 diabetes mellitus with diabetic neuropathy, unspecified: Secondary | ICD-10-CM

## 2017-05-12 DIAGNOSIS — E785 Hyperlipidemia, unspecified: Secondary | ICD-10-CM | POA: Diagnosis not present

## 2017-05-12 DIAGNOSIS — R351 Nocturia: Secondary | ICD-10-CM | POA: Diagnosis not present

## 2017-05-12 DIAGNOSIS — J302 Other seasonal allergic rhinitis: Secondary | ICD-10-CM

## 2017-05-12 DIAGNOSIS — F321 Major depressive disorder, single episode, moderate: Secondary | ICD-10-CM

## 2017-05-12 DIAGNOSIS — I10 Essential (primary) hypertension: Secondary | ICD-10-CM | POA: Diagnosis not present

## 2017-05-12 DIAGNOSIS — J3089 Other allergic rhinitis: Secondary | ICD-10-CM

## 2017-05-12 LAB — POCT GLYCOSYLATED HEMOGLOBIN (HGB A1C): Hemoglobin A1C: 6.2

## 2017-05-12 MED ORDER — MONTELUKAST SODIUM 10 MG PO TABS: 10.0000 mg | ORAL_TABLET | Freq: Every day | ORAL | 1 refills | Status: DC

## 2017-05-12 MED ORDER — LEVETIRACETAM 250 MG PO TABS: 250.0000 mg | ORAL_TABLET | Freq: Every day | ORAL | 0 refills | Status: DC

## 2017-05-12 MED ORDER — METFORMIN HCL ER 750 MG PO TB24: ORAL_TABLET | ORAL | 1 refills | Status: DC

## 2017-05-12 MED ORDER — FLUTICASONE PROPIONATE 50 MCG/ACT NA SUSP: NASAL | 1 refills | Status: DC

## 2017-05-12 MED ORDER — DULOXETINE HCL 60 MG PO CPEP: 60.0000 mg | ORAL_CAPSULE | Freq: Every day | ORAL | 1 refills | Status: DC

## 2017-05-12 MED ORDER — ATORVASTATIN CALCIUM 40 MG PO TABS: 40.0000 mg | ORAL_TABLET | Freq: Every day | ORAL | 1 refills | Status: DC

## 2017-05-12 NOTE — Progress Notes (Signed)
Name: David Skinner   MRN: [id number]    DOB: [birth year]   Date:05/12/2017       Progress Note  Subjective  Chief Complaint  Chief Complaint  Patient presents with  . Diabetes  . Hypertension  . Seizures    HPI    He is here with translator from [employer]   Major depression: he had an episode in the past about 6 years ago, but over the past 12  months he has been feeling down again and worse the week before his last follow up April 2018 he had noticed increase in  fatigue, lack of motivation, no energy, difficulty staying asleep and decrease in appetite, we started him on Cymbalta April 2018 , he is now on 60 mg of medication, he states it helps with his mood and fatigue/lack of motivation. He denies suicidal thoughts or ideation  Hyperlipidemia: taking Atorvastatin, denies side effects, no chest pain or myalgia.  OSA/minimal/RLS: he sees Dr Manuella Ghazi for memory loss and had sleep study 2018 it showed minimal sleep apnea and RLS - patient states needs to move when he first goes to bed, he has follow up with Dr. Manuella Ghazi next week. He takes Trazodone given by Dr. Manuella Ghazi and is sleeping well.   HTN: bp is at goal.  He has noticed some orthostatic changes, we called pharmacy and it does not seem like he has been getting prescription, no chest pain or palpitation  Alcoholism: he used to drink heavy while in [place] used to drink liquor at least 3 times a week and used to get drunk, when he moved to [place] ( 1979)  he started to drink beer, mostly on weekends ,but quit back in  2017.   AR: he is taking Loratadine and Singulair, no longer coughing or wheezing. He smoked in the past but quit over 10 years ago. Symptoms under control now, worse in the Spring. He still has occasional sneezing, rhinorrhea but better.   Partial Seizure: he was taking anti seizure medication but it was stopped by Dr. Manuella Ghazi. . Last episode 02/2016 and went to [facility], he had been off Keppra and it was resumed at the time, he is  still on medication, but states down to half pill daily. seeing Dr. Manuella Ghazi  Diabetes Type 2 diet controlled with ED, hgbA1C hadgone up to 6.9% and we started Metformin back in 10/2015, down to 6.2% today  , he denies side effects of medication.He has episodes of polyphagia but denies polydipsia or polyuria. He is up to date with eye and foot exam  Patient Active Problem List   Diagnosis Date Noted  . OSA (obstructive sleep apnea) 12/09/2016  . RLS (restless legs syndrome) 12/09/2016  . Episode of moderate major depression ([place]) 12/09/2016  . History of alcoholism ([place]) 07/22/2016  . Cervical radiculitis 01/09/2016  . Lumbosacral radiculitis 01/09/2016  . Osteoarthritis of carpometacarpal (CMC) joint of thumb 01/09/2016  . Tinea cruris 03/21/2015  . 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 Depression with anxiety 11/28/2014  . Chronic constipation 11/28/2014  . Insomnia 11/28/2014  . Dyslipidemia 11/28/2014  . Fatty infiltration of liver 11/28/2014  . Essential (primary) hypertension 11/28/2014  . Gastric reflux 11/28/2014  . Memory loss or impairment 11/28/2014  . Perennial allergic rhinitis with seasonal variation 11/28/2014  . Vitamin D deficiency 11/28/2014  . Tenosynovitis of thumb 11/28/2014  . Partial epilepsy with impairment of consciousness ([place]) 11/16/2014    Past Surgical History:  Procedure Laterality Date  . COLONOSCOPY WITH PROPOFOL N/A 03/25/2017   Procedure: COLONOSCOPY WITH PROPOFOL;  Surgeon: Lin Landsman, MD;  Location: [facility];  Service: Endoscopy;  Laterality: N/A;  . ESOPHAGOGASTRODUODENOSCOPY N/A 03/25/2017   Procedure: ESOPHAGOGASTRODUODENOSCOPY (EGD);  Surgeon: Lin Landsman, MD;  Location: [facility];  Service: Endoscopy;  Laterality: N/A;  Diabetic - oral meds  . LASIK Bilateral     Family History  Family history unknown: Yes    Social History   Socioeconomic History   . Marital status: Single    Spouse name: Not on file  . Number of children: Not on file  . Years of education: Not on file  . Highest education level: Not on file  Social Needs  . Financial resource strain: Not on file  . Food insecurity - worry: Not on file  . Food insecurity - inability: Not on file  . Transportation needs - medical: Not on file  . Transportation needs - non-medical: Not on file  Occupational History  . Occupation: Scientist, forensic  Tobacco Use  . Smoking status: Former Smoker    Packs/day: 1.00    Years: 15.00    Pack years: 15.00    Types: Cigarettes    Start date: 06/17/1984    Last attempt to quit: 11/28/1999    Years since quitting: 17.4  . Smokeless tobacco: Never Used  Substance and Sexual Activity  . Alcohol use: No    Alcohol/week: 0.0 oz    Comment: used to drink a pack on weekends.   . Drug use: No  . Sexual activity: Yes    Partners: Female  Other Topics Concern  . 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  aspirin EC 81 MG tablet, Take 1 tablet (81 mg total) by mouth daily., Disp: 30 tablet, Rfl: 0 .  atorvastatin (LIPITOR) 40 MG tablet, Take 1 tablet (40 mg total) by mouth daily., Disp: 90 tablet, Rfl: 1 .  baclofen (LIORESAL) 20 MG tablet, take 1 tablet by mouth at bedtime if needed for muscle spasm, Disp: 30 tablet, Rfl: 0 .  DULoxetine (CYMBALTA) 60 MG capsule, Take 1 capsule (60 mg total) by mouth daily. Daily, Disp: 90 capsule, Rfl: 1 .  fluticasone (FLONASE) 50 MCG/ACT nasal spray, instill 2 sprays into each nostril at bedtime if needed, Disp: 48 g, Rfl: 1 .  gabapentin (NEURONTIN) 600 MG tablet, Take by mouth., Disp: , Rfl:  .  ketoconazole (NIZORAL) 2 % cream, Apply 1 application topically 2 (two) times daily., Disp: 60 g, Rfl: 2 .  levETIRAcetam (KEPPRA) 250 MG tablet, Take 1 tablet (250 mg total) by mouth daily., Disp: 30 tablet, Rfl: 0 .  loratadine  (CLARITIN) 10 MG tablet, Take 1 tablet (10 mg total) by mouth 2 (two) times daily., Disp: 180 tablet, Rfl: 1 .  metFORMIN (GLUCOPHAGE-XR) 750 MG 24 hr tablet, take 1 tablet by mouth once daily WITH BREAKFAST, Disp: 90 tablet, Rfl: 1 .  montelukast (SINGULAIR) 10 MG tablet, Take 1 tablet (10 mg total) by mouth daily., Disp: 90 tablet, Rfl: 1 .  polyethylene glycol powder (GLYCOLAX/MIRALAX) powder, Take 17 g by mouth daily. (Patient not taking: Reported on 04/10/2017), Disp: 3350 g, Rfl: 0 .  tamsulosin (FLOMAX) 0.4 MG CAPS capsule, Take 1 capsule (0.4 mg total) by mouth daily., Disp: 90 capsule, Rfl: 0 .  traZODone (DESYREL) 100 MG  tablet, Take 1 tablet by mouth at bedtime., Disp: , Rfl: 0  Allergies  Allergen Reactions  . Ibuprofen Itching  . Levofloxacin   . Lisinopril Cough  . Metoprolol   . Nsaids Itching  . Tolmetin Itching  . Tramadol Hcl      ROS  Constitutional: Negative for fever or weight change.  Respiratory: Negative for cough and shortness of breath.   Cardiovascular: Negative for chest pain or palpitations.  Gastrointestinal: Negative for abdominal pain, no bowel changes.  Musculoskeletal: Negative for gait problem or joint swelling.  Skin: Negative for rash.  Neurological: Negative for dizziness or headache.  No other specific complaints in a complete review of systems (except as listed in HPI above).  Objective  Vitals:   05/12/17 1123  BP: 102/80  Pulse: (!) 106  Resp: 14  SpO2: 96%  Weight: 163 lb 1.6 oz (74 kg)  Height: 5\' 5"  (1.651 m)    Body mass index is 27.14 kg/m.  Physical Exam  Constitutional: Patient appears well-developed and well-nourished. Overweight.  No distress.  HEENT: head atraumatic, normocephalic, pupils equal and reactive to light, neck supple, throat within normal limits Cardiovascular: Normal rate, regular rhythm and normal heart sounds.  No murmur heard. No BLE edema. Pulmonary/Chest: Effort normal and breath sounds normal. No  respiratory distress. Abdominal: Soft.  There is no tenderness. Psychiatric: Patient has a normal mood and affect. behavior is normal. Judgment and thought content normal.  Recent Results (from the past 2160 hour(s))  Thyroid Panel With TSH     Status: None   Collection Time: 03/03/17  1:56 PM  Result Value Ref Range   T3 Uptake 30 22 - 35 %   T4, Total 7.4 4.9 - 10.5 mcg/dL   Free Thyroxine Index 2.2 1.4 - 3.8   TSH 0.45 0.40 - 4.50 mIU/L  COMPLETE METABOLIC PANEL WITH GFR     Status: Abnormal   Collection Time: 03/03/17  1:56 PM  Result Value Ref Range   Glucose, Bld 76 65 - 99 mg/dL    Comment: .            Fasting reference interval .    BUN 19 7 - 25 mg/dL   Creat 0.93 0.70 - 1.25 mg/dL    Comment: For patients >35 years of age, the reference limit for Creatinine is approximately 13% higher for people identified as African-American. .    GFR, Est Non African American 87 > OR = 60 mL/min/1.60m2   GFR, Est African American 101 > OR = 60 mL/min/1.62m2   BUN/Creatinine Ratio NOT APPLICABLE 6 - 22 (calc)   Sodium 139 135 - 146 mmol/L   Potassium 4.8 3.5 - 5.3 mmol/L   Chloride 103 98 - 110 mmol/L   CO2 27 20 - 32 mmol/L   Calcium 9.9 8.6 - 10.3 mg/dL   Total Protein 7.7 6.1 - 8.1 g/dL   Albumin 4.4 3.6 - 5.1 g/dL   Globulin 3.3 1.9 - 3.7 g/dL (calc)   AG Ratio 1.3 1.0 - 2.5 (calc)   Total Bilirubin 0.8 0.2 - 1.2 mg/dL   Alkaline phosphatase (APISO) 62 40 - 115 U/L   AST 30 10 - 35 U/L   ALT 48 (H) 9 - 46 U/L  CBC with Differential/Platelet     Status: Abnormal   Collection Time: 03/03/17  1:56 PM  Result Value Ref Range   WBC 6.2 3.8 - 10.8 Thousand/uL   RBC 5.52 4.20 - 5.80 Million/uL  Hemoglobin 16.8 13.2 - 17.1 g/dL   HCT 50.3 (H) 38.5 - 50.0 %   MCV 91.1 80.0 - 100.0 fL   MCH 30.4 27.0 - 33.0 pg   MCHC 33.4 32.0 - 36.0 g/dL   RDW 12.6 11.0 - 15.0 %   Platelets 260 140 - 400 Thousand/uL   MPV 9.6 7.5 - 12.5 fL   Neutro Abs 3,528 1,500 - 7,800 cells/uL    Lymphs Abs 1,668 850 - 3,900 cells/uL   WBC mixed population 738 200 - 950 cells/uL   Eosinophils Absolute 229 15 - 500 cells/uL   Basophils Absolute 37 0 - 200 cells/uL   Neutrophils Relative % 56.9 %   Total Lymphocyte 26.9 %   Monocytes Relative 11.9 %   Eosinophils Relative 3.7 %   Basophils Relative 0.6 %  Urinalysis, Routine w reflex microscopic     Status: Abnormal   Collection Time: 03/17/17  3:00 PM  Result Value Ref Range   Color, Urine YELLOW (A) YELLOW   APPearance CLEAR (A) CLEAR   Specific Gravity, Urine 1.012 1.005 - 1.030   pH 5.0 5.0 - 8.0   Glucose, UA NEGATIVE NEGATIVE mg/dL   Hgb urine dipstick NEGATIVE NEGATIVE   Bilirubin Urine NEGATIVE NEGATIVE   Ketones, ur NEGATIVE NEGATIVE mg/dL   Protein, ur NEGATIVE NEGATIVE mg/dL   Nitrite NEGATIVE NEGATIVE   Leukocytes, UA NEGATIVE NEGATIVE  Hepatitis panel, acute     Status: None   Collection Time: 03/17/17  3:28 PM  Result Value Ref Range   Hepatitis B Surface Ag Negative Negative   HCV Ab <0.1 0.0 - 0.9 s/co ratio    Comment: (NOTE)                                  Negative:     < 0.8                             Indeterminate: 0.8 - 0.9                                  Positive:     > 0.9 The CDC recommends that a positive HCV antibody result be followed up with a HCV Nucleic Acid Amplification test ([id number]). Performed At: [facility] [place], [place] [id number] Lindon Romp MD UX:[id number]    Hep A IgM Negative Negative   Hep B C IgM Negative Negative  Glucose, capillary     Status: Abnormal   Collection Time: 03/25/17  7:27 AM  Result Value Ref Range   Glucose-Capillary 121 (H) 65 - 99 mg/dL  Glucose, capillary     Status: Abnormal   Collection Time: 03/25/17 10:52 AM  Result Value Ref Range   Glucose-Capillary 111 (H) 65 - 99 mg/dL  POCT UA - Microalbumin     Status: None   Collection Time: 04/10/17 10:18 AM  Result Value Ref Range   Microalbumin Ur, POC 20 mg/L    Creatinine, POC  mg/dL   Albumin/Creatinine Ratio, Urine, POC    Urine Culture     Status: None   Collection Time: 04/10/17 10:57 AM  Result Value Ref Range   MICRO NUMBER: [id number]    SPECIMEN QUALITY: ADEQUATE    Sample Source URINE    STATUS: FINAL  Result: No Growth   POCT HgB A1C     Status: Abnormal   Collection Time: 05/12/17 11:36 AM  Result Value Ref Range   Hemoglobin A1C 6.2       PHQ2/9: Depression screen [facility] 2/9 04/10/2017 12/09/2016 10/04/2016 05/28/2016 02/02/2016  Decreased Interest 0 0 3 0 0  Down, Depressed, Hopeless 0 0 0 0 0  PHQ - 2 Score 0 0 3 0 0  Altered sleeping - - 3 - -  Tired, decreased energy - - 3 - -  Change in appetite - - 3 - -  Feeling bad or failure about yourself  - - 0 - -  Trouble concentrating - - 3 - -  Moving slowly or fidgety/restless - - 0 - -  Suicidal thoughts - - 0 - -  PHQ-9 Score - - 15 - -  Difficult doing work/chores - - Very difficult - -     Fall Risk: Fall Risk  05/12/2017 04/10/2017 12/09/2016 05/28/2016 02/02/2016  Falls in the past year? No No No Yes No  Number falls in past yr: - - - 1 -  Injury with Fall? - - - No -     Assessment & Plan  1. 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 Dyslipidemia  - atorvastatin (LIPITOR) 40 MG tablet; Take 1 tablet (40 mg total) by mouth daily.  Dispense: 90 tablet; Refill: 1  3. Episode of moderate major depression (HCC)  - DULoxetine (CYMBALTA) 60 MG capsule; Take 1 capsule (60 mg total) by mouth daily. Daily  Dispense: 90 capsule; Refill: 1  4. Perennial allergic rhinitis with seasonal variation  - fluticasone (FLONASE) 50 MCG/ACT nasal spray; instill 2 sprays into each nostril at bedtime if needed  Dispense: 48 g; Refill: 1 - montelukast (SINGULAIR) 10 MG tablet; Take 1 tablet (10 mg  total) by mouth daily.  Dispense: 90 tablet; Refill: 1  5. Essential (primary) hypertension  Advised to stay hydrated and get up slowly.   6. Partial epilepsy with impairment of consciousness ([place])  Continue follow up with Dr. Manuella Ghazi   7. Nocturia  Needs follow up with urologist

## 2017-05-12 NOTE — Patient Instructions (Signed)
Please make sure not taking Lotensin ( Benazepril) 40 mg not at pharmacy

## 2017-05-28 ENCOUNTER — Ambulatory Visit: Payer: BLUE CROSS/BLUE SHIELD | Admitting: Urology

## 2017-05-28 ENCOUNTER — Encounter: Payer: Self-pay | Admitting: Urology

## 2017-05-28 VITALS — BP 126/78 | HR 103 | Ht 65.0 in | Wt 160.0 lb

## 2017-05-28 DIAGNOSIS — N400 Enlarged prostate without lower urinary tract symptoms: Secondary | ICD-10-CM

## 2017-05-28 DIAGNOSIS — N401 Enlarged prostate with lower urinary tract symptoms: Secondary | ICD-10-CM | POA: Diagnosis not present

## 2017-05-28 LAB — URINALYSIS, COMPLETE
BILIRUBIN UA: NEGATIVE
Glucose, UA: NEGATIVE
Ketones, UA: NEGATIVE
LEUKOCYTES UA: NEGATIVE
NITRITE UA: NEGATIVE
PH UA: 6 (ref 5.0–7.5)
Protein, UA: NEGATIVE
RBC, UA: NEGATIVE
Specific Gravity, UA: 1.025 (ref 1.005–1.030)
Urobilinogen, Ur: 0.2 mg/dL (ref 0.2–1.0)

## 2017-05-28 LAB — BLADDER SCAN AMB NON-IMAGING

## 2017-05-28 MED ORDER — MIRABEGRON ER 50 MG PO TB24: 50.0000 mg | ORAL_TABLET | Freq: Every day | ORAL | 0 refills | Status: DC

## 2017-05-28 NOTE — Progress Notes (Signed)
05/28/2017 2:39 PM   David Skinner 1951-02-07 [id number]  Referring provider: [name], MD [address] [place], [place] [address]  Chief Complaint  Patient presents with  . Benign Prostatic Hypertrophy    New Patient    HPI: David Skinner is a 66 year old [place] male seen in consultation at the request of Dr. Ancil Boozer for evaluation of lower urinary tract symptoms.  He presents today with an interpreter.  He has a greater than five-year history of voiding symptoms consisting of urinary frequency, intermittent urinary stream, straining to urinate and nocturia x5.  He denies dysuria or gross hematuria.  He denies flank, abdominal, pelvic or scrotal pain.  He states he saw a urologist 2 years ago and was placed on medication but he does not remember what he took.  He is presently on tamsulosin and has not seen any significant improvement in his voiding symptoms.  A recent PSA was 0.4.   PMH: Past Medical History:  Diagnosis Date  . Allergy   . Anxiety   . Atopy   . Chronic constipation   . Elevated LFTs   . Fatty liver   . GERD (gastroesophageal reflux disease)   . Hyperlipidemia   . Hypertension   . Hypoglycemia   . Insomnia   . Memory change   . Overweight   . Paresthesia of hand   . Seizure ([place])    12/12, 09/27/14, 02/28/16  . Tenosynovitis of finger   . Tinea cruris   . Vitamin D deficiency     Surgical History: Past Surgical History:  Procedure Laterality Date  . COLONOSCOPY WITH PROPOFOL N/A 03/25/2017   Procedure: COLONOSCOPY WITH PROPOFOL;  Surgeon: Lin Landsman, MD;  Location: [facility];  Service: Endoscopy;  Laterality: N/A;  . ESOPHAGOGASTRODUODENOSCOPY N/A 03/25/2017   Procedure: ESOPHAGOGASTRODUODENOSCOPY (EGD);  Surgeon: Lin Landsman, MD;  Location: [facility];  Service: Endoscopy;  Laterality: N/A;  Diabetic - oral meds  . LASIK Bilateral     Home Medications:  Allergies as of 05/28/2017    Reactions   Ibuprofen Itching   Levofloxacin    Lisinopril Cough   Metoprolol    Nsaids Itching   Tolmetin Itching   Tramadol Hcl       Medication List        Accurate as of 05/28/17  2:39 PM. Always use your most recent med list.          aspirin EC 81 MG tablet Take 1 tablet (81 mg total) by mouth daily.   atorvastatin 40 MG tablet Commonly known as:  LIPITOR Take 1 tablet (40 mg total) by mouth daily.   baclofen 20 MG tablet Commonly known as:  LIORESAL take 1 tablet by mouth at bedtime if needed for muscle spasm   DULoxetine 60 MG capsule Commonly known as:  CYMBALTA Take 1 capsule (60 mg total) by mouth daily. Daily   fluticasone 50 MCG/ACT nasal spray Commonly known as:  FLONASE instill 2 sprays into each nostril at bedtime if needed   gabapentin 600 MG tablet Commonly known as:  NEURONTIN Take by mouth.   ketoconazole 2 % cream Commonly known as:  NIZORAL Apply 1 application topically 2 (two) times daily.   levETIRAcetam 250 MG tablet Commonly known as:  KEPPRA Take 1 tablet (250 mg total) by mouth daily.   loratadine 10 MG tablet Commonly known as:  CLARITIN Take 1 tablet (10 mg total) by mouth 2 (two) times daily.  metFORMIN 750 MG 24 hr tablet Commonly known as:  GLUCOPHAGE-XR take 1 tablet by mouth once daily WITH BREAKFAST   montelukast 10 MG tablet Commonly known as:  SINGULAIR Take 1 tablet (10 mg total) by mouth daily.   polyethylene glycol powder powder Commonly known as:  GLYCOLAX/MIRALAX Take 17 g by mouth daily.   tamsulosin 0.4 MG Caps capsule Commonly known as:  FLOMAX Take 1 capsule (0.4 mg total) by mouth daily.   traZODone 100 MG tablet Commonly known as:  DESYREL Take 1 tablet by mouth at bedtime.       Allergies:  Allergies  Allergen Reactions  . Ibuprofen Itching  . Levofloxacin   . Lisinopril Cough  . Metoprolol   . Nsaids Itching  . Tolmetin Itching  . Tramadol Hcl     Family History: Family History   Family history unknown: Yes    Social History:  reports that he quit smoking about 17 years ago. His smoking use included cigarettes. He started smoking about 32 years ago. He has a 15.00 pack-year smoking history. he has never used smokeless tobacco. He reports that he does not drink alcohol or use drugs.  ROS: UROLOGY Frequent Urination?: No Hard to postpone urination?: No Burning/pain with urination?: No Get up at night to urinate?: No Leakage of urine?: No Urine stream starts and stops?: No Trouble starting stream?: No Do you have to strain to urinate?: No Blood in urine?: No Urinary tract infection?: No Sexually transmitted disease?: No Injury to kidneys or bladder?: No Painful intercourse?: No Weak stream?: No Erection problems?: No Penile pain?: No  Gastrointestinal Nausea?: No Vomiting?: No Indigestion/heartburn?: No Diarrhea?: No Constipation?: No  Constitutional Fever: No Night sweats?: No Weight loss?: No Fatigue?: No  Skin Skin rash/lesions?: No Itching?: Yes  Eyes Blurred vision?: No Double vision?: No  Ears/Nose/Throat Sore throat?: No Sinus problems?: Yes  Hematologic/Lymphatic Swollen glands?: No Easy bruising?: No  Cardiovascular Leg swelling?: No Chest pain?: No  Respiratory Cough?: No Shortness of breath?: No  Endocrine Excessive thirst?: No  Musculoskeletal Back pain?: Yes Joint pain?: No  Neurological Headaches?: No Dizziness?: No  Psychologic Depression?: No Anxiety?: No  Physical Exam: BP 126/78   Pulse (!) 103   Ht 5\' 5"  (1.651 m)   Wt 160 lb (72.6 kg)   BMI 26.63 kg/m   Constitutional:  Alert and oriented, No acute distress. HEENT: [place] AT, moist mucus membranes.  Trachea midline, no masses. Cardiovascular: No clubbing, cyanosis, or edema. Respiratory: Normal respiratory effort, no increased work of breathing. GI: Abdomen is soft, nontender, nondistended, no abdominal masses GU: No CVA tenderness.  Penis  uncircumcised without lesions, testes descended bilaterally without masses or tenderness, Cord/epididymes palpably normal; prostate 35 g, smooth without nodules Skin: No rashes, bruises or suspicious lesions. Lymph: No cervical or inguinal adenopathy. Neurologic: Grossly intact, no focal deficits, moving all 4 extremities. Psychiatric: Normal mood and affect.  Laboratory Data: Lab Results  Component Value Date   WBC 6.2 03/03/2017   HGB 16.8 03/03/2017   HCT 50.3 (H) 03/03/2017   MCV 91.1 03/03/2017   PLT 260 03/03/2017    Lab Results  Component Value Date   CREATININE 0.93 03/03/2017    Lab Results  Component Value Date   HGBA1C 6.2 05/12/2017    Urinalysis Dipstick: Negative Microscopy: Negative   Assessment & Plan:   1. Benign prostatic hyperplasia with lower urinary tract symptoms, symptom details unspecified PVR by bladder scan was 31 mL.  Continue tamsulosin.  Will add Myrbetriq 50 mg daily and he was given 4 weeks of samples.  Follow-up 4 weeks for symptom reassessment and if no improvement will perform cystoscopy at that visit.  - Urinalysis, Complete - BLADDER SCAN AMB NON-IMAGING  Return in about 4 weeks (around 06/25/2017) for Cystoscopy.   Abbie Sons, [place] [address] [place], [place] [address] [phone]

## 2017-07-01 ENCOUNTER — Ambulatory Visit: Payer: BLUE CROSS/BLUE SHIELD | Admitting: Urology

## 2017-07-01 ENCOUNTER — Encounter: Payer: Self-pay | Admitting: Urology

## 2017-07-01 VITALS — BP 119/83 | HR 111 | Ht 65.0 in | Wt 165.0 lb

## 2017-07-01 DIAGNOSIS — N401 Enlarged prostate with lower urinary tract symptoms: Secondary | ICD-10-CM | POA: Diagnosis not present

## 2017-07-01 LAB — URINALYSIS, COMPLETE
BILIRUBIN UA: NEGATIVE
Ketones, UA: NEGATIVE
Leukocytes, UA: NEGATIVE
Nitrite, UA: NEGATIVE
PROTEIN UA: NEGATIVE
SPEC GRAV UA: 1.025 (ref 1.005–1.030)
UUROB: 0.2 mg/dL (ref 0.2–1.0)
pH, UA: 5.5 (ref 5.0–7.5)

## 2017-07-01 LAB — MICROSCOPIC EXAMINATION
Epithelial Cells (non renal): NONE SEEN /hpf (ref 0–10)
RBC, UA: NONE SEEN /hpf (ref 0–?)
WBC UA: NONE SEEN /HPF (ref 0–?)

## 2017-07-01 NOTE — Progress Notes (Signed)
   07/01/17  CC:  Chief Complaint  Patient presents with  . Cysto    HPI: Refer to my previous note of 05/28/2017.  He did not take the Myrbetriq as he stated he was traveling and lost it.  Blood pressure 119/83, pulse (!) 111, height 5\' 5"  (1.651 m), weight 165 lb (74.8 kg). NED. A&Ox3.     Cystoscopy Procedure Note  Patient identification was confirmed, informed consent was obtained, and patient was prepped using Betadine solution.  Lidocaine jelly was administered per urethral meatus.    Preoperative abx where received prior to procedure.     Pre-Procedure: - Inspection reveals a normal caliber ureteral meatus.  Procedure: The flexible cystoscope was introduced without difficulty - No urethral strictures/lesions are present. - Normal prostate without significant lateral lobe enlargement - Normal bladder neck - Bilateral ureteral orifices identified - Bladder mucosa  reveals no ulcers, tumors, or lesions - No bladder stones - No trabeculation  Retroflexion shows no median lobe   Post-Procedure: - Patient tolerated the procedure well  Assessment/ Plan: No significant prostate enlargement and symptoms most likely reflect bladder overactivity.  He was given samples of Myrbetriq 25 mg and will follow-up in 1 month.

## 2017-07-31 ENCOUNTER — Ambulatory Visit: Payer: BLUE CROSS/BLUE SHIELD | Admitting: Urology

## 2017-07-31 ENCOUNTER — Encounter: Payer: Self-pay | Admitting: Urology

## 2017-07-31 VITALS — BP 114/77 | HR 97 | Ht 65.0 in | Wt 168.0 lb

## 2017-07-31 DIAGNOSIS — R399 Unspecified symptoms and signs involving the genitourinary system: Secondary | ICD-10-CM

## 2017-07-31 MED ORDER — MIRABEGRON ER 50 MG PO TB24: 50.0000 mg | ORAL_TABLET | Freq: Every day | ORAL | 3 refills | Status: DC

## 2017-07-31 NOTE — Progress Notes (Signed)
07/31/2017 10:35 AM   David Skinner 07-14-1950 [id number]  Referring provider: [name], MD [address] [place], [place] [address]  Chief Complaint  Patient presents with  . Follow-up  . Medication Management    Pt states that his sx are much better with medication but does feel tired since starting meds    HPI: 67 year old male previously seen for lower urinary tract symptoms including urinary frequency, intermittent urinary stream, straining to urinate and nocturia x5.  He had no improvement with tamsulosin.  Cystoscopy performed at last visit showed no significant prostate enlargement.  He was given a trial of Myrbetriq and noted significant improvement in his voiding symptoms.  He currently has no voiding complaints.   PMH: Past Medical History:  Diagnosis Date  . Allergy   . Anxiety   . Atopy   . Chronic constipation   . Elevated LFTs   . Fatty liver   . GERD (gastroesophageal reflux disease)   . Hyperlipidemia   . Hypertension   . Hypoglycemia   . Insomnia   . Memory change   . Overweight   . Paresthesia of hand   . Seizure ([place])    12/12, 09/27/14, 02/28/16  . Tenosynovitis of finger   . Tinea cruris   . Vitamin D deficiency     Surgical History: Past Surgical History:  Procedure Laterality Date  . COLONOSCOPY WITH PROPOFOL N/A 03/25/2017   Procedure: COLONOSCOPY WITH PROPOFOL;  Surgeon: Lin Landsman, MD;  Location: [facility];  Service: Endoscopy;  Laterality: N/A;  . ESOPHAGOGASTRODUODENOSCOPY N/A 03/25/2017   Procedure: ESOPHAGOGASTRODUODENOSCOPY (EGD);  Surgeon: Lin Landsman, MD;  Location: [facility];  Service: Endoscopy;  Laterality: N/A;  Diabetic - oral meds  . LASIK Bilateral     Home Medications:  Allergies as of 07/31/2017      Reactions   Ibuprofen Itching   Levofloxacin    Lisinopril Cough   Metoprolol    Nsaids Itching   Tolmetin Itching   Tramadol Hcl       Medication List       Accurate as of 07/31/17 10:35 AM. Always use your most recent med list.          aspirin EC 81 MG tablet Take 1 tablet (81 mg total) by mouth daily.   atorvastatin 40 MG tablet Commonly known as:  LIPITOR Take 1 tablet (40 mg total) by mouth daily.   DULoxetine 60 MG capsule Commonly known as:  CYMBALTA Take 1 capsule (60 mg total) by mouth daily. Daily   fluticasone 50 MCG/ACT nasal spray Commonly known as:  FLONASE instill 2 sprays into each nostril at bedtime if needed   gabapentin 600 MG tablet Commonly known as:  NEURONTIN Take by mouth.   levETIRAcetam 250 MG tablet Commonly known as:  KEPPRA Take 1 tablet (250 mg total) by mouth daily.   loratadine 10 MG tablet Commonly known as:  CLARITIN Take 1 tablet (10 mg total) by mouth 2 (two) times daily.   metFORMIN 750 MG 24 hr tablet Commonly known as:  GLUCOPHAGE-XR take 1 tablet by mouth once daily WITH BREAKFAST   methocarbamol 500 MG tablet Commonly known as:  ROBAXIN Take 500 mg by mouth 4 (four) times daily.   mirabegron ER 50 MG Tb24 tablet Commonly known as:  MYRBETRIQ Take 1 tablet (50 mg total) by mouth daily.   mirtazapine 15 MG tablet Commonly known as:  REMERON Take 15 mg by mouth at  bedtime.   montelukast 10 MG tablet Commonly known as:  SINGULAIR Take 1 tablet (10 mg total) by mouth daily.   traZODone 100 MG tablet Commonly known as:  DESYREL Take 1 tablet by mouth at bedtime.       Allergies:  Allergies  Allergen Reactions  . Ibuprofen Itching  . Levofloxacin   . Lisinopril Cough  . Metoprolol   . Nsaids Itching  . Tolmetin Itching  . Tramadol Hcl     Family History: Family History  Family history unknown: Yes    Social History:  reports that he quit smoking about 17 years ago. His smoking use included cigarettes. He started smoking about 33 years ago. He has a 15.00 pack-year smoking history. he has never used smokeless tobacco. He reports that he does not drink alcohol  or use drugs.  ROS: UROLOGY Frequent Urination?: Yes Hard to postpone urination?: Yes Burning/pain with urination?: No Get up at night to urinate?: Yes Leakage of urine?: Yes Urine stream starts and stops?: Yes Trouble starting stream?: No Do you have to strain to urinate?: No Blood in urine?: Yes Urinary tract infection?: No Sexually transmitted disease?: Yes Injury to kidneys or bladder?: No Painful intercourse?: No Weak stream?: No Erection problems?: No Penile pain?: No  Gastrointestinal Nausea?: No Vomiting?: No Indigestion/heartburn?: No Diarrhea?: No Constipation?: No  Constitutional Fever: No Night sweats?: No Weight loss?: No Fatigue?: Yes  Skin Skin rash/lesions?: No Itching?: Yes  Eyes Blurred vision?: No Double vision?: No  Ears/Nose/Throat Sore throat?: No Sinus problems?: Yes  Hematologic/Lymphatic Swollen glands?: No Easy bruising?: No  Cardiovascular Leg swelling?: No Chest pain?: No  Respiratory Cough?: No Shortness of breath?: No  Endocrine Excessive thirst?: Yes  Musculoskeletal Back pain?: No Joint pain?: No  Neurological Headaches?: No Dizziness?: No  Psychologic Depression?: No Anxiety?: No  Physical Exam: BP 114/77 (BP Location: Left Arm, Patient Position: Sitting, Cuff Size: Normal)   Pulse 97   Ht 5\' 5"  (1.651 m)   Wt 168 lb (76.2 kg)   SpO2 97%   BMI 27.96 kg/m   Constitutional:  Alert and oriented, No acute distress. HEENT: [place] AT, moist mucus membranes.  Trachea midline, no masses. Cardiovascular: No clubbing, cyanosis, or edema. Respiratory: Normal respiratory effort, no increased work of breathing. Skin: No rashes, bruises or suspicious lesions. Lymph: No cervical or inguinal adenopathy. Neurologic: Grossly intact, no focal deficits, moving all 4 extremities. Psychiatric: Normal mood and affect.  Laboratory Data: Lab Results  Component Value Date   WBC 6.2 03/03/2017   HGB 16.8 03/03/2017   HCT  50.3 (H) 03/03/2017   MCV 91.1 03/03/2017   PLT 260 03/03/2017    Lab Results  Component Value Date   CREATININE 0.93 03/03/2017    Lab Results  Component Value Date   HGBA1C 6.2 05/12/2017    Urinalysis Lab Results  Component Value Date   SPECGRAV 1.025 07/01/2017   PHUR 5.5 07/01/2017   COLORU Yellow 07/01/2017   APPEARANCEUR Clear 07/01/2017   LEUKOCYTESUR Negative 07/01/2017   PROTEINUR Negative 07/01/2017   GLUCOSEU 1+ (A) 07/01/2017   KETONESU Negative 07/01/2017   RBCU Trace (A) 07/01/2017   BILIRUBINUR Negative 07/01/2017   UUROB 0.2 07/01/2017   NITRITE Negative 07/01/2017    Lab Results  Component Value Date   LABMICR See below: 07/01/2017   WBCUA None seen 07/01/2017   RBCUA None seen 07/01/2017   LABEPIT None seen 07/01/2017   MUCUS Present 07/01/2017   BACTERIA Few (A) 07/01/2017  Assessment & Plan:   67 year old male with lower urinary tract symptoms/overactive bladder with marked improvement on Myrbetriq.  He desires to continue and Rx was sent to his pharmacy.  Follow-up annually.  Return in about 1 year (around 07/31/2018) for Recheck.  Abbie Sons, [facility] [address] [place], [place] [address] [phone]

## 2017-08-05 ENCOUNTER — Telehealth: Payer: Self-pay | Admitting: Radiology

## 2017-08-05 NOTE — Telephone Encounter (Signed)
[place] interpreter, Maudie Mercury (phone [phone]), says pt states Myrbetriq is too expensive & asks if there is an alternative medication that can be prescribed. Please advise.

## 2017-08-06 NOTE — Telephone Encounter (Signed)
Advised that pt needs to call insurance company to verify which formulary alternative would be covered & call back so it can be prescribed. Interpreter, Kim, voices understanding with no further questions at this time.

## 2017-08-06 NOTE — Telephone Encounter (Signed)
There is most likely a formulary alternative however it will depend on the patient's insurance on what is the least expensive.

## 2017-08-13 ENCOUNTER — Ambulatory Visit: Payer: BLUE CROSS/BLUE SHIELD | Admitting: Family Medicine

## 2017-08-13 ENCOUNTER — Other Ambulatory Visit: Payer: Self-pay | Admitting: Radiology

## 2017-08-13 ENCOUNTER — Encounter: Payer: Self-pay | Admitting: Family Medicine

## 2017-08-13 ENCOUNTER — Other Ambulatory Visit: Payer: Self-pay

## 2017-08-13 VITALS — BP 120/96 | HR 91 | Resp 14 | Ht 65.0 in | Wt 167.3 lb

## 2017-08-13 DIAGNOSIS — G40209 Localization-related (focal) (partial) symptomatic epilepsy and epileptic syndromes with complex partial seizures, not intractable, without status epilepticus: Secondary | ICD-10-CM | POA: Diagnosis not present

## 2017-08-13 DIAGNOSIS — J302 Other seasonal allergic rhinitis: Secondary | ICD-10-CM | POA: Diagnosis not present

## 2017-08-13 DIAGNOSIS — E785 Hyperlipidemia, unspecified: Secondary | ICD-10-CM | POA: Diagnosis not present

## 2017-08-13 DIAGNOSIS — F321 Major depressive disorder, single episode, moderate: Secondary | ICD-10-CM | POA: Diagnosis not present

## 2017-08-13 DIAGNOSIS — N521 Erectile dysfunction due to diseases classified elsewhere: Secondary | ICD-10-CM | POA: Diagnosis not present

## 2017-08-13 DIAGNOSIS — J3089 Other allergic rhinitis: Secondary | ICD-10-CM | POA: Diagnosis not present

## 2017-08-13 DIAGNOSIS — E559 Vitamin D deficiency, unspecified: Secondary | ICD-10-CM

## 2017-08-13 DIAGNOSIS — G4733 Obstructive sleep apnea (adult) (pediatric): Secondary | ICD-10-CM | POA: Diagnosis not present

## 2017-08-13 DIAGNOSIS — R399 Unspecified symptoms and signs involving the genitourinary system: Secondary | ICD-10-CM

## 2017-08-13 DIAGNOSIS — I10 Essential (primary) hypertension: Secondary | ICD-10-CM

## 2017-08-13 DIAGNOSIS — E1140 Type 2 diabetes mellitus with diabetic neuropathy, unspecified: Secondary | ICD-10-CM

## 2017-08-13 MED ORDER — MIRABEGRON ER 25 MG PO TB24: 25.0000 mg | ORAL_TABLET | Freq: Every day | ORAL | 11 refills | Status: DC

## 2017-08-13 MED ORDER — DULOXETINE HCL 60 MG PO CPEP: 60.0000 mg | ORAL_CAPSULE | Freq: Every day | ORAL | 1 refills | Status: DC

## 2017-08-13 MED ORDER — METFORMIN HCL ER 750 MG PO TB24: ORAL_TABLET | ORAL | 1 refills | Status: DC

## 2017-08-13 NOTE — Progress Notes (Signed)
Name: David Skinner   MRN: [id number]    DOB: 1951-05-08   Date:08/13/2017       Progress Note  Subjective  Chief Complaint  Chief Complaint  Patient presents with  . Diabetes  . Hypertension  . Depression    HPI  He is here with translator, from language resources - Maudie Mercury  Major depression: he had an episode in the past about 6 years ago, worse since 2018, he is on Cymbalta since April 2018, initially it seemed to help, but has been feeling worse again, Phq9 is high. He sates he has been taking medication, having fatigue, lack of motivation, noticing some memory change.   Hyperlipidemia: not sure if taking Atorvastatin ( seems like he is only taking Metformin and Cymbalta at this time) denies side effects, no chest pain or myalgia.  OSA/minimal/RLS: he sees Dr Manuella Ghazi for memory loss and had sleep study 2018 it showed minimal sleep apnea and RLS - seeing Dr. Manuella Ghazi. He was given Remeron and Trazodone for sleep, but does not seem to be taking either one of the medications. We will try to contact Dr. Trena Platt office, also advised patient to return in about 1 month for follow up ( he is going out of the country for one month next week)   HTN: DBP is elevated, last visit it was towards low end of normal, we will recheck before he leaves today. He is off medication,  no chest pain or palpitation  Alcoholism: he used to drink heavy while in [place] used to drink liquor at least 3 times a week and used to get drunk, when he moved to [place] ( 1979) he started to drink beer, mostly on weekends ,but quit back in  2017.   AR: he does not seem to be  taking Loratadine and Singulair, he has a dry cough again but no  wheezing. He smoked in the past but quit over 10 years ago. Needs to resume medication   Partial Seizure: Last episode 02/2016 and went to [facility], he had been off [place] and it was resumed at the time, he is currently not taking medication, but has refill at pharmacy ( we just verified)  but  states down to half pill daily. seeing Dr. Manuella Ghazi.  Diabetes Type 2 diet controlled with ED, hgbA1C hadgone up to 6.9% and we started Metformin back in 10/2015, down to 6.4% today  , he denies side effects of medication. He has episodes of polyphagia but denies polydipsia or polyuria. He is up to date with eye, foot exam done today    Patient Active Problem List   Diagnosis Date Noted  . OSA (obstructive sleep apnea) 12/09/2016  . RLS (restless legs syndrome) 12/09/2016  . Episode of moderate major depression ([place]) 12/09/2016  . History of alcoholism ([place]) 07/22/2016  . Cervical radiculitis 01/09/2016  . Lumbosacral radiculitis 01/09/2016  . Osteoarthritis of carpometacarpal (CMC) joint of thumb 01/09/2016  . Tinea cruris 03/21/2015  . 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 Depression with anxiety 11/28/2014  . Chronic constipation 11/28/2014  . Insomnia 11/28/2014  . Dyslipidemia 11/28/2014  . Fatty infiltration of liver 11/28/2014  . Essential (primary) hypertension 11/28/2014  . Gastric reflux 11/28/2014  . Memory loss or impairment 11/28/2014  . Perennial allergic rhinitis with seasonal variation 11/28/2014  . Vitamin D deficiency 11/28/2014  . Tenosynovitis of thumb 11/28/2014  . Partial epilepsy with impairment of consciousness ([place]) 11/16/2014    Past  Surgical History:  Procedure Laterality Date  . COLONOSCOPY WITH PROPOFOL N/A 03/25/2017   Procedure: COLONOSCOPY WITH PROPOFOL;  Surgeon: Lin Landsman, MD;  Location: [facility];  Service: Endoscopy;  Laterality: N/A;  . ESOPHAGOGASTRODUODENOSCOPY N/A 03/25/2017   Procedure: ESOPHAGOGASTRODUODENOSCOPY (EGD);  Surgeon: Lin Landsman, MD;  Location: [facility];  Service: Endoscopy;  Laterality: N/A;  Diabetic - oral meds  . LASIK Bilateral     Family History  Family history unknown: Yes    Social History   Socioeconomic History  . Marital  status: Single    Spouse name: Not on file  . Number of children: Not on file  . Years of education: Not on file  . Highest education level: Not on file  Social Needs  . Financial resource strain: Not on file  . Food insecurity - worry: Not on file  . Food insecurity - inability: Not on file  . Transportation needs - medical: Not on file  . Transportation needs - non-medical: Not on file  Occupational History  . Occupation: Scientist, forensic  Tobacco Use  . Smoking status: Former Smoker    Packs/day: 1.00    Years: 15.00    Pack years: 15.00    Types: Cigarettes    Start date: 06/17/1984    Last attempt to quit: 11/28/1999    Years since quitting: 17.7  . Smokeless tobacco: Never Used  Substance and Sexual Activity  . Alcohol use: No    Alcohol/week: 0.0 oz    Comment: used to drink a pack on weekends.   . Drug use: No  . Sexual activity: Yes    Partners: Female  Other Topics Concern  . 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  aspirin EC 81 MG tablet, Take 1 tablet (81 mg total) by mouth daily., Disp: 30 tablet, Rfl: 0 .  atorvastatin (LIPITOR) 40 MG tablet, Take 1 tablet (40 mg total) by mouth daily., Disp: 90 tablet, Rfl: 1 .  DULoxetine (CYMBALTA) 60 MG capsule, Take 1 capsule (60 mg total) by mouth daily. Daily, Disp: 90 capsule, Rfl: 1 .  fluticasone (FLONASE) 50 MCG/ACT nasal spray, instill 2 sprays into each nostril at bedtime if needed, Disp: 48 g, Rfl: 1 .  loratadine (CLARITIN) 10 MG tablet, Take 1 tablet (10 mg total) by mouth 2 (two) times daily., Disp: 180 tablet, Rfl: 1 .  metFORMIN (GLUCOPHAGE-XR) 750 MG 24 hr tablet, take 1 tablet by mouth once daily WITH BREAKFAST, Disp: 90 tablet, Rfl: 1 .  montelukast (SINGULAIR) 10 MG tablet, Take 1 tablet (10 mg total) by mouth daily., Disp: 90 tablet, Rfl: 1 .  traZODone (DESYREL) 100 MG tablet, Take 1 tablet by mouth at bedtime., Disp: ,  Rfl: 0 .  gabapentin (NEURONTIN) 600 MG tablet, Take by mouth., Disp: , Rfl:  .  levETIRAcetam (KEPPRA) 250 MG tablet, Take 1 tablet (250 mg total) by mouth daily. (Patient not taking: Reported on 08/13/2017), Disp: 30 tablet, Rfl: 0 .  methocarbamol (ROBAXIN) 500 MG tablet, Take 500 mg by mouth 4 (four) times daily., Disp: , Rfl:  .  mirabegron ER (MYRBETRIQ) 50 MG TB24 tablet, Take 1 tablet (50 mg total) by mouth daily. (Patient not taking: Reported on 08/13/2017), Disp: 90 tablet, Rfl: 3 .  mirtazapine (REMERON) 15 MG tablet, Take 15 mg by mouth at bedtime., Disp: , Rfl:   Allergies  Allergen Reactions  . Ibuprofen Itching  . Levofloxacin   . Lisinopril Cough  . Metoprolol   . Nsaids Itching  . Tolmetin Itching  . Tramadol Hcl      ROS  Constitutional: Negative for fever or weight change.  Respiratory: Negative for cough and shortness of breath.   Cardiovascular: Negative for chest pain or palpitations.  Gastrointestinal: Negative for abdominal pain, no bowel changes.  Musculoskeletal: Negative for gait problem or joint swelling.  Skin: Negative for rash.  Neurological: Negative for dizziness , positive for headache - nuchal pain intermittent .  No other specific complaints in a complete review of systems (except as listed in HPI above).  Objective  Vitals:   08/13/17 0903  BP: (!) 120/96  Pulse: 91  Resp: 14  SpO2: 98%  Weight: 167 lb 4.8 oz (75.9 kg)  Height: 5\' 5"  (1.651 m)    Body mass index is 27.84 kg/m.  Physical Exam  Constitutional: Patient appears well-developed and well-nourished. Overweight.  No distress.  HEENT: head atraumatic, normocephalic, pupils equal and reactive to light, neck supple, throat within normal limits Cardiovascular: Normal rate, regular rhythm and normal heart sounds.  No murmur heard. No BLE edema. Pulmonary/Chest: Effort normal and breath sounds normal. No respiratory distress. Abdominal: Soft.  There is no  tenderness. Psychiatric: Patient has a normal mood and affect. behavior is normal. Judgment and thought content normal.  Recent Results (from the past 2160 hour(s))  Urinalysis, Complete     Status: None   Collection Time: 05/28/17  2:00 PM  Result Value Ref Range   Specific Gravity, UA 1.025 1.005 - 1.030   pH, UA 6.0 5.0 - 7.5   Color, UA Yellow Yellow   Appearance Ur Clear Clear   Leukocytes, UA Negative Negative   Protein, UA Negative Negative/Trace   Glucose, UA Negative Negative   Ketones, UA Negative Negative   RBC, UA Negative Negative   Bilirubin, UA Negative Negative   Urobilinogen, Ur 0.2 0.2 - 1.0 mg/dL   Nitrite, UA Negative Negative  BLADDER SCAN AMB NON-IMAGING     Status: None   Collection Time: 05/28/17  2:25 PM  Result Value Ref Range   Scan Result 33ml   Urinalysis, Complete     Status: Abnormal   Collection Time: 07/01/17  1:51 PM  Result Value Ref Range   Specific Gravity, UA 1.025 1.005 - 1.030   pH, UA 5.5 5.0 - 7.5   Color, UA Yellow Yellow   Appearance Ur Clear Clear   Leukocytes, UA Negative Negative   Protein, UA Negative Negative/Trace   Glucose, UA 1+ (A) Negative   Ketones, UA Negative Negative   RBC, UA Trace (A) Negative   Bilirubin, UA Negative Negative   Urobilinogen, Ur 0.2 0.2 - 1.0 mg/dL   Nitrite, UA Negative Negative   Microscopic Examination See below:   Microscopic Examination     Status: Abnormal   Collection Time: 07/01/17  1:51 PM  Result Value Ref Range   WBC, UA None seen 0 - 5 /hpf   RBC, UA None seen 0 - 2 /hpf   Epithelial Cells (non renal) None seen 0 - 10 /hpf   Casts Present (A) None seen /lpf   Cast Type Hyaline casts N/A   Mucus, UA Present Not Estab.   Bacteria, UA Few (A) None seen/Few    Diabetic Foot Exam: Diabetic Foot Exam - Simple   Simple Foot Form Diabetic Foot exam was performed with the following  findings:  Yes 08/13/2017  9:41 AM  Visual Inspection No deformities, no ulcerations, no other skin  breakdown bilaterally:  Yes Sensation Testing Intact to touch and monofilament testing bilaterally:  Yes Pulse Check Posterior Tibialis and Dorsalis pulse intact bilaterally:  Yes Comments      PHQ2/9: Depression screen [facility] 2/9 08/13/2017 04/10/2017 12/09/2016 10/04/2016 05/28/2016  Decreased Interest 3 0 0 3 0  Down, Depressed, Hopeless 0 0 0 0 0  PHQ - 2 Score 3 0 0 3 0  Altered sleeping 1 - - 3 -  Tired, decreased energy 3 - - 3 -  Change in appetite 3 - - 3 -  Feeling bad or failure about yourself  0 - - 0 -  Trouble concentrating 3 - - 3 -  Moving slowly or fidgety/restless 0 - - 0 -  Suicidal thoughts 0 - - 0 -  PHQ-9 Score 13 - - 15 -  Difficult doing work/chores Somewhat difficult - - Very difficult -    Fall Risk: Fall Risk  08/13/2017 05/12/2017 04/10/2017 12/09/2016 05/28/2016  Falls in the past year? No No No No Yes  Number falls in past yr: - - - - 1  Injury with Fall? - - - - No      Functional Status Survey: Is the patient deaf or have difficulty hearing?: No Does the patient have difficulty seeing, even when wearing glasses/contacts?: No Does the patient have difficulty concentrating, remembering, or making decisions?: No Does the patient have difficulty walking or climbing stairs?: No Does the patient have difficulty dressing or bathing?: No Does the patient have difficulty doing errands alone such as visiting a doctor's office or shopping?: No    Assessment & Plan  1. 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 Dyslipidemia  - Lipid panel  3. Moderate major depression ([facility])  - DULoxetine (CYMBALTA) 60 MG capsule; Take 1 capsule (60 mg total) by mouth daily. Daily  Dispense: 90 capsule; Refill: 1  4. Perennial allergic rhinitis with seasonal variation  He has not been taking  medication, also complaining of itching but no rashes  5. Partial epilepsy with impairment of consciousness ([place])  It does not seem like he is taking Keppra, per neurologist note he is supposed to continue medication, we will contact pharmacy and also neurologist   6. Essential (primary) hypertension  - Comprehensive metabolic panel  7. OSA (obstructive sleep apnea)  Not significant enough for CPAP  He has two sleep aids on his list, not sure if Dr. Manuella Ghazi would like to have him take both or just one of them, we will contact his office today   8. Vitamin D deficiency  - VITAMIN D 25 Hydroxy (Vit-D Deficiency, Fractures)

## 2017-08-14 ENCOUNTER — Other Ambulatory Visit: Payer: Self-pay | Admitting: Family Medicine

## 2017-08-14 LAB — COMPREHENSIVE METABOLIC PANEL
AG Ratio: 1.5 (calc) (ref 1.0–2.5)
ALT: 56 U/L — AB (ref 9–46)
AST: 30 U/L (ref 10–35)
Albumin: 4.9 g/dL (ref 3.6–5.1)
Alkaline phosphatase (APISO): 69 U/L (ref 40–115)
BUN: 14 mg/dL (ref 7–25)
CO2: 29 mmol/L (ref 20–32)
CREATININE: 1 mg/dL (ref 0.70–1.25)
Calcium: 10.1 mg/dL (ref 8.6–10.3)
Chloride: 102 mmol/L (ref 98–110)
GLOBULIN: 3.3 g/dL (ref 1.9–3.7)
GLUCOSE: 113 mg/dL — AB (ref 65–99)
Potassium: 4.6 mmol/L (ref 3.5–5.3)
Sodium: 138 mmol/L (ref 135–146)
Total Bilirubin: 1 mg/dL (ref 0.2–1.2)
Total Protein: 8.2 g/dL — ABNORMAL HIGH (ref 6.1–8.1)

## 2017-08-14 LAB — LIPID PANEL
Cholesterol: 259 mg/dL — ABNORMAL HIGH (ref <200)
HDL: 57 mg/dL (ref >40)
LDL Cholesterol (Calc): 156 mg/dL (calc) — ABNORMAL HIGH
Non-HDL Cholesterol (Calc): 202 mg/dL (calc) — ABNORMAL HIGH (ref <130)
Total CHOL/HDL Ratio: 4.5 (calc) (ref <5.0)
Triglycerides: 278 mg/dL — ABNORMAL HIGH (ref <150)

## 2017-08-14 LAB — VITAMIN D 25 HYDROXY (VIT D DEFICIENCY, FRACTURES): Vit D, 25-Hydroxy: 16 ng/mL — ABNORMAL LOW (ref 30–100)

## 2017-08-14 MED ORDER — VITAMIN D (ERGOCALCIFEROL) 1.25 MG (50000 UNIT) PO CAPS: 50000.0000 [IU] | ORAL_CAPSULE | ORAL | 0 refills | Status: DC

## 2017-09-16 ENCOUNTER — Telehealth: Payer: Self-pay

## 2017-09-16 NOTE — Telephone Encounter (Signed)
PA for myrbetriq DENIED!

## 2017-09-23 ENCOUNTER — Ambulatory Visit: Payer: BLUE CROSS/BLUE SHIELD

## 2017-10-06 ENCOUNTER — Encounter: Payer: Self-pay | Admitting: Family Medicine

## 2017-10-06 ENCOUNTER — Ambulatory Visit: Payer: BLUE CROSS/BLUE SHIELD | Admitting: Family Medicine

## 2017-10-06 VITALS — BP 106/78 | HR 98 | Temp 97.5°F | Resp 16 | Ht 65.0 in | Wt 164.3 lb

## 2017-10-06 DIAGNOSIS — E559 Vitamin D deficiency, unspecified: Secondary | ICD-10-CM

## 2017-10-06 DIAGNOSIS — I10 Essential (primary) hypertension: Secondary | ICD-10-CM

## 2017-10-06 DIAGNOSIS — E785 Hyperlipidemia, unspecified: Secondary | ICD-10-CM

## 2017-10-06 DIAGNOSIS — F321 Major depressive disorder, single episode, moderate: Secondary | ICD-10-CM

## 2017-10-06 LAB — COMPLETE METABOLIC PANEL WITH GFR
AG Ratio: 1.6 (calc) (ref 1.0–2.5)
ALKALINE PHOSPHATASE (APISO): 60 U/L (ref 40–115)
ALT: 56 U/L — AB (ref 9–46)
AST: 32 U/L (ref 10–35)
Albumin: 4.7 g/dL (ref 3.6–5.1)
BUN: 18 mg/dL (ref 7–25)
CO2: 29 mmol/L (ref 20–32)
CREATININE: 1.01 mg/dL (ref 0.70–1.25)
Calcium: 9.6 mg/dL (ref 8.6–10.3)
Chloride: 104 mmol/L (ref 98–110)
GFR, Est African American: 91 mL/min/{1.73_m2} (ref >=60)
GFR, Est Non African American: 78 mL/min/{1.73_m2} (ref >=60)
GLUCOSE: 99 mg/dL (ref 65–99)
Globulin: 3 g/dL (calc) (ref 1.9–3.7)
Potassium: 4.7 mmol/L (ref 3.5–5.3)
Sodium: 139 mmol/L (ref 135–146)
Total Bilirubin: 0.9 mg/dL (ref 0.2–1.2)
Total Protein: 7.7 g/dL (ref 6.1–8.1)

## 2017-10-06 LAB — LIPID PANEL
CHOL/HDL RATIO: 3.3 (calc) (ref <5.0)
Cholesterol: 210 mg/dL — ABNORMAL HIGH (ref <200)
HDL: 64 mg/dL (ref >40)
LDL CHOLESTEROL (CALC): 122 mg/dL — AB
Non-HDL Cholesterol (Calc): 146 mg/dL (calc) — ABNORMAL HIGH (ref <130)
Triglycerides: 129 mg/dL (ref <150)

## 2017-10-06 MED ORDER — VITAMIN D (ERGOCALCIFEROL) 1.25 MG (50000 UNIT) PO CAPS: 50000.0000 [IU] | ORAL_CAPSULE | ORAL | 0 refills | Status: DC

## 2017-10-06 NOTE — Progress Notes (Signed)
Name: David Skinner   MRN: [id number]    DOB: 08/26/1950   Date:10/06/2017       Progress Note  Subjective  Chief Complaint  Chief Complaint  Patient presents with  . Follow-up    1 month F/U-Bring Medication-Does paitent need Vitamin D    HPI  HTN: bp is at goal, denies chest pain, but has noticed occasional palpitation - it is brief, resolves when he seats down.   Major Depression: taking Duloxetine, just returned from [place] and is emotionally feeling better, denies side effects of medication. He does feel tired all the time  Vitamin D deficiency: we will resume Vitamin D supplementation   Hyperlipidemia: back on Lipitor, last time he was not taking medication and one of the liver enzymes slightly elevated, we will check labs again today. No muscle aches or chest pain    Patient Active Problem List   Diagnosis Date Noted  . OSA (obstructive sleep apnea) 12/09/2016  . RLS (restless legs syndrome) 12/09/2016  . Episode of moderate major depression ([place]) 12/09/2016  . History of alcoholism ([place]) 07/22/2016  . Cervical radiculitis 01/09/2016  . Lumbosacral radiculitis 01/09/2016  . Osteoarthritis of carpometacarpal (CMC) joint of thumb 01/09/2016  . Tinea cruris 03/21/2015  . 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 Depression with anxiety 11/28/2014  . Chronic constipation 11/28/2014  . Insomnia 11/28/2014  . Dyslipidemia 11/28/2014  . Fatty infiltration of liver 11/28/2014  . Essential (primary) hypertension 11/28/2014  . Gastric reflux 11/28/2014  . Memory loss or impairment 11/28/2014  . Perennial allergic rhinitis with seasonal variation 11/28/2014  . Vitamin D deficiency 11/28/2014  . Tenosynovitis of thumb 11/28/2014  . Partial epilepsy with impairment of consciousness ([place]) 11/16/2014    Past Surgical History:  Procedure Laterality Date  . COLONOSCOPY WITH PROPOFOL N/A 03/25/2017   Procedure: COLONOSCOPY WITH  PROPOFOL;  Surgeon: Lin Landsman, MD;  Location: [facility];  Service: Endoscopy;  Laterality: N/A;  . ESOPHAGOGASTRODUODENOSCOPY N/A 03/25/2017   Procedure: ESOPHAGOGASTRODUODENOSCOPY (EGD);  Surgeon: Lin Landsman, MD;  Location: [facility];  Service: Endoscopy;  Laterality: N/A;  Diabetic - oral meds  . LASIK Bilateral     Family History  Family history unknown: Yes    Social History   Socioeconomic History  . Marital status: Single    Spouse name: Not on file  . Number of children: Not on file  . Years of education: Not on file  . Highest education level: Not on file  Occupational History  . Occupation: Scientist, forensic  Social Needs  . Financial resource strain: Not on file  . Food insecurity:    Worry: Not on file    Inability: Not on file  . Transportation needs:    Medical: Not on file    Non-medical: Not on file  Tobacco Use  . Smoking status: Former Smoker    Packs/day: 1.00    Years: 15.00    Pack years: 15.00    Types: Cigarettes    Start date: 06/17/1984    Last attempt to quit: 11/28/1999    Years since quitting: 17.8  . Smokeless tobacco: Never Used  Substance and Sexual Activity  . Alcohol use: No    Alcohol/week: 0.0 oz    Comment: used to drink a pack on weekends.   . Drug use: No  . Sexual activity: Yes    Partners: Female  Lifestyle  . Physical activity:  Days per week: Not on file    Minutes per session: Not on file  . Stress: Not on file  Relationships  . Social connections:    Talks on phone: Not on file    Gets together: Not on file    Attends religious service: Not on file    Active member of club or organization: Not on file    Attends meetings of clubs or organizations: Not on file    Relationship status: Not on file  . Intimate partner violence:    Fear of current or ex partner: Not on file    Emotionally abused: Not on file    Physically abused: Not on file    Forced sexual activity: Not on file   Other Topics Concern  . 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  aspirin EC 81 MG tablet, Take 1 tablet (81 mg total) by mouth daily., Disp: 30 tablet, Rfl: 0 .  atorvastatin (LIPITOR) 40 MG tablet, Take 1 tablet (40 mg total) by mouth daily., Disp: 90 tablet, Rfl: 1 .  DULoxetine (CYMBALTA) 60 MG capsule, Take 1 capsule (60 mg total) by mouth daily. Daily, Disp: 90 capsule, Rfl: 1 .  fluticasone (FLONASE) 50 MCG/ACT nasal spray, instill 2 sprays into each nostril at bedtime if needed, Disp: 48 g, Rfl: 1 .  levETIRAcetam (KEPPRA) 250 MG tablet, Take 1 tablet (250 mg total) by mouth daily., Disp: 30 tablet, Rfl: 0 .  loratadine (CLARITIN) 10 MG tablet, Take 1 tablet (10 mg total) by mouth 2 (two) times daily., Disp: 180 tablet, Rfl: 1 .  metFORMIN (GLUCOPHAGE-XR) 750 MG 24 hr tablet, take 1 tablet by mouth once daily WITH BREAKFAST, Disp: 90 tablet, Rfl: 1 .  montelukast (SINGULAIR) 10 MG tablet, Take 1 tablet (10 mg total) by mouth daily., Disp: 90 tablet, Rfl: 1 .  traZODone (DESYREL) 100 MG tablet, Take 1 tablet by mouth at bedtime., Disp: , Rfl: 0 .  mirabegron ER (MYRBETRIQ) 25 MG TB24 tablet, Take 1 tablet (25 mg total) by mouth daily. (Patient not taking: Reported on 10/06/2017), Disp: 30 tablet, Rfl: 11 .  Vitamin D, Ergocalciferol, (DRISDOL) 50000 units CAPS capsule, Take 1 capsule (50,000 Units total) by mouth every 7 (seven) days., Disp: 12 capsule, Rfl: 0  Allergies  Allergen Reactions  . Ibuprofen Itching  . Levofloxacin   . Lisinopril Cough  . Metoprolol   . Nsaids Itching  . Tolmetin Itching  . Tramadol Hcl      ROS  Constitutional: Negative for fever or weight change.  Respiratory: Negative for cough and shortness of breath.   Cardiovascular: Negative for chest pain , but has occasional  palpitations.  Gastrointestinal: Negative for abdominal pain, no bowel changes.  Musculoskeletal:  Negative for gait problem or joint swelling.  Skin: Negative for rash.  Neurological: Negative for dizziness or headache.  No other specific complaints in a complete review of systems (except as listed in HPI above).  Objective  Vitals:   10/06/17 0935  BP: 106/78  Pulse: 98  Resp: 16  Temp: (!) 97.5 F (36.4 C)  TempSrc: Oral  SpO2: 98%  Weight: 164 lb 4.8 oz (74.5 kg)  Height: 5\' 5"  (1.651 m)    Body mass index is 27.34 kg/m.  Physical Exam  Constitutional: Patient appears well-developed and well-nourished.   No distress.  HEENT: head atraumatic, normocephalic, pupils equal  and reactive to light, neck supple, throat within normal limits Cardiovascular: Normal rate, regular rhythm and normal heart sounds.  No murmur heard. No BLE edema. Pulmonary/Chest: Effort normal and breath sounds normal. No respiratory distress. Abdominal: Soft.  There is no tenderness. Psychiatric: Patient has a normal mood and affect. behavior is normal. Judgment and thought content normal.  Recent Results (from the past 2160 hour(s))  Comprehensive metabolic panel     Status: Abnormal   Collection Time: 08/13/17 10:19 AM  Result Value Ref Range   Glucose, Bld 113 (H) 65 - 99 mg/dL    Comment: .            Fasting reference interval . For someone without known diabetes, a glucose value between 100 and 125 mg/dL is consistent with prediabetes and should be confirmed with a follow-up test. .    BUN 14 7 - 25 mg/dL   Creat 1.00 0.70 - 1.25 mg/dL    Comment: For patients >71 years of age, the reference limit for Creatinine is approximately 13% higher for people identified as African-American. .    BUN/Creatinine Ratio NOT APPLICABLE 6 - 22 (calc)   Sodium 138 135 - 146 mmol/L   Potassium 4.6 3.5 - 5.3 mmol/L   Chloride 102 98 - 110 mmol/L   CO2 29 20 - 32 mmol/L   Calcium 10.1 8.6 - 10.3 mg/dL   Total Protein 8.2 (H) 6.1 - 8.1 g/dL   Albumin 4.9 3.6 - 5.1 g/dL   Globulin 3.3 1.9 - 3.7  g/dL (calc)   AG Ratio 1.5 1.0 - 2.5 (calc)   Total Bilirubin 1.0 0.2 - 1.2 mg/dL   Alkaline phosphatase (APISO) 69 40 - 115 U/L   AST 30 10 - 35 U/L   ALT 56 (H) 9 - 46 U/L  Lipid panel     Status: Abnormal   Collection Time: 08/13/17 10:19 AM  Result Value Ref Range   Cholesterol 259 (H) <200 mg/dL   HDL 57 >40 mg/dL   Triglycerides 278 (H) <150 mg/dL   LDL Cholesterol (Calc) 156 (H) mg/dL (calc)    Comment: Reference range: <100 . Desirable range <100 mg/dL for primary prevention;   <70 mg/dL for patients with CHD or diabetic patients  with > or = 2 CHD risk factors. Marland Kitchen LDL-C is now calculated using the Martin-Hopkins  calculation, which is a validated novel method providing  better accuracy than the Friedewald equation in the  estimation of LDL-C.  Cresenciano Genre et al. Annamaria Helling. 7619;509(32): 2061-2068  (http://education.[facility].com/faq/FAQ164)    Total CHOL/HDL Ratio 4.5 <5.0 (calc)   Non-HDL Cholesterol (Calc) 202 (H) <130 mg/dL (calc)    Comment: For patients with diabetes plus 1 major ASCVD risk  factor, treating to a non-HDL-C goal of <100 mg/dL  (LDL-C of <70 mg/dL) is considered a therapeutic  option.   VITAMIN D 25 Hydroxy (Vit-D Deficiency, Fractures)     Status: Abnormal   Collection Time: 08/13/17 10:19 AM  Result Value Ref Range   Vit D, 25-Hydroxy 16 (L) 30 - 100 ng/mL    Comment: Vitamin D Status         25-OH Vitamin D: . Deficiency:                    <20 ng/mL Insufficiency:             20 - 29 ng/mL Optimal:                 >  or = 30 ng/mL . For 25-OH Vitamin D testing on patients on  D2-supplementation and patients for whom quantitation  of D2 and D3 fractions is required, the QuestAssureD(TM) 25-OH VIT D, (D2,D3), LC/MS/MS is recommended: order  code [id number] (patients >52yrs). . For more information on this test, go to: http://education.[facility].com/faq/FAQ163 (This link is being provided for  informational/educational purposes only.)    COMPLETE METABOLIC PANEL WITH GFR     Status: Abnormal   Collection Time: 10/06/17 10:33 AM  Result Value Ref Range   Glucose, Bld 99 65 - 99 mg/dL    Comment: .            Fasting reference interval .    BUN 18 7 - 25 mg/dL   Creat 1.01 0.70 - 1.25 mg/dL    Comment: For patients >24 years of age, the reference limit for Creatinine is approximately 13% higher for people identified as African-American. .    GFR, Est Non African American 78 > OR = 60 mL/min/1.35m2   GFR, Est African American 91 > OR = 60 mL/min/1.31m2   BUN/Creatinine Ratio NOT APPLICABLE 6 - 22 (calc)   Sodium 139 135 - 146 mmol/L   Potassium 4.7 3.5 - 5.3 mmol/L   Chloride 104 98 - 110 mmol/L   CO2 29 20 - 32 mmol/L   Calcium 9.6 8.6 - 10.3 mg/dL   Total Protein 7.7 6.1 - 8.1 g/dL   Albumin 4.7 3.6 - 5.1 g/dL   Globulin 3.0 1.9 - 3.7 g/dL (calc)   AG Ratio 1.6 1.0 - 2.5 (calc)   Total Bilirubin 0.9 0.2 - 1.2 mg/dL   Alkaline phosphatase (APISO) 60 40 - 115 U/L   AST 32 10 - 35 U/L   ALT 56 (H) 9 - 46 U/L  Lipid panel     Status: Abnormal   Collection Time: 10/06/17 10:33 AM  Result Value Ref Range   Cholesterol 210 (H) <200 mg/dL   HDL 64 >40 mg/dL   Triglycerides 129 <150 mg/dL   LDL Cholesterol (Calc) 122 (H) mg/dL (calc)    Comment: Reference range: <100 . Desirable range <100 mg/dL for primary prevention;   <70 mg/dL for patients with CHD or diabetic patients  with > or = 2 CHD risk factors. Marland Kitchen LDL-C is now calculated using the Martin-Hopkins  calculation, which is a validated novel method providing  better accuracy than the Friedewald equation in the  estimation of LDL-C.  Cresenciano Genre et al. Annamaria Helling. 9379;024(09): 2061-2068  (http://education.[facility].com/faq/FAQ164)    Total CHOL/HDL Ratio 3.3 <5.0 (calc)   Non-HDL Cholesterol (Calc) 146 (H) <130 mg/dL (calc)    Comment: For patients with diabetes plus 1 major ASCVD risk  factor, treating to a non-HDL-C goal of <100 mg/dL  (LDL-C of  <70 mg/dL) is considered a therapeutic  option.       PHQ2/9: Depression screen [facility] 2/9 10/06/2017 08/13/2017 04/10/2017 12/09/2016 10/04/2016  Decreased Interest 2 3 0 0 3  Down, Depressed, Hopeless 0 0 0 0 0  PHQ - 2 Score 2 3 0 0 3  Altered sleeping 1 1 - - 3  Tired, decreased energy 0 3 - - 3  Change in appetite 2 3 - - 3  Feeling bad or failure about yourself  0 0 - - 0  Trouble concentrating 0 3 - - 3  Moving slowly or fidgety/restless 0 0 - - 0  Suicidal thoughts 0 0 - - 0  PHQ-9 Score 5 13 - -  15  Difficult doing work/chores Somewhat difficult Somewhat difficult - - Very difficult    Fall Risk: Fall Risk  10/06/2017 08/13/2017 05/12/2017 04/10/2017 12/09/2016  Falls in the past year? No No No No No  Number falls in past yr: - - - - -  Injury with Fall? - - - - -     Assessment & Plan  1. Dyslipidemia  Recheck levels, he is now back on Atorvastatin daily - COMPLETE METABOLIC PANEL WITH GFR - Lipid panel  2. Moderate major depression (HCC)  Taking Duloxetine and is feeling better now  3. Essential (primary) hypertension  At goal   4. Vitamin D deficiency  He wants to go back on weekly medication instead of daily supplementation  - Vitamin D, Ergocalciferol, (DRISDOL) 50000 units CAPS capsule; Take 1 capsule (50,000 Units total) by mouth every 7 (seven) days.  Dispense: 12 capsule; Refill: 0

## 2017-10-07 ENCOUNTER — Encounter: Payer: Self-pay | Admitting: Family Medicine

## 2017-11-08 ENCOUNTER — Other Ambulatory Visit: Payer: Self-pay | Admitting: Family Medicine

## 2017-11-08 DIAGNOSIS — J302 Other seasonal allergic rhinitis: Secondary | ICD-10-CM

## 2017-11-08 DIAGNOSIS — J3089 Other allergic rhinitis: Principal | ICD-10-CM

## 2017-11-08 DIAGNOSIS — F321 Major depressive disorder, single episode, moderate: Secondary | ICD-10-CM

## 2017-11-17 ENCOUNTER — Ambulatory Visit: Payer: BLUE CROSS/BLUE SHIELD | Admitting: Nurse Practitioner

## 2017-11-17 ENCOUNTER — Encounter: Payer: Self-pay | Admitting: Nurse Practitioner

## 2017-11-17 VITALS — BP 110/70 | HR 100 | Temp 98.0°F | Resp 16 | Ht 65.0 in | Wt 161.3 lb

## 2017-11-17 DIAGNOSIS — E785 Hyperlipidemia, unspecified: Secondary | ICD-10-CM | POA: Diagnosis not present

## 2017-11-17 DIAGNOSIS — K219 Gastro-esophageal reflux disease without esophagitis: Secondary | ICD-10-CM | POA: Diagnosis not present

## 2017-11-17 DIAGNOSIS — F321 Major depressive disorder, single episode, moderate: Secondary | ICD-10-CM

## 2017-11-17 DIAGNOSIS — E559 Vitamin D deficiency, unspecified: Secondary | ICD-10-CM

## 2017-11-17 DIAGNOSIS — H9311 Tinnitus, right ear: Secondary | ICD-10-CM | POA: Diagnosis not present

## 2017-11-17 MED ORDER — ATORVASTATIN CALCIUM 80 MG PO TABS: 80.0000 mg | ORAL_TABLET | Freq: Every day | ORAL | 2 refills | Status: DC

## 2017-11-17 MED ORDER — RANITIDINE HCL 75 MG PO TABS: 75.0000 mg | ORAL_TABLET | Freq: Two times a day (BID) | ORAL | 2 refills | Status: DC

## 2017-11-17 NOTE — Progress Notes (Addendum)
Name: David Skinner   MRN: [id number]    DOB: [birth year]   Date:11/17/2017       Progress Note  Subjective  Chief Complaint  Chief Complaint  Patient presents with  . Follow-up    6 week recheck    HPI  Patient presents for 6 week follow-up on depression. Translator line used: language: Vietnamese  Depression Patient taking duloxetine. States he still has a bit of depression.  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 Patient saw ENT over 5 years ago did hearing test and states nothing was wrong with year. Pt denies dizziness, or spinning in head.   GERD patient endorses epigastric abdominal pain associated with gas after heavy meals, has not identified specific triggers. Patient states pain self-resolved after a few hours. Does not take medicine for this. Denies nausea, vomiting, diarrhea, constipation, denies radiation.   Patient Active Problem List   Diagnosis Date Noted  . OSA (obstructive sleep apnea) 12/09/2016  . RLS (restless legs syndrome) 12/09/2016  . Episode of moderate major depression ([place]) 12/09/2016  . History of alcoholism ([place]) 07/22/2016  . Cervical radiculitis 01/09/2016  . Lumbosacral radiculitis 01/09/2016  . Osteoarthritis of carpometacarpal (CMC) joint of thumb 01/09/2016  . Tinea cruris 03/21/2015  . 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 Depression with anxiety 11/28/2014  . Chronic constipation 11/28/2014  . Insomnia 11/28/2014  .  Dyslipidemia 11/28/2014  . Fatty infiltration of liver 11/28/2014  . Essential (primary) hypertension 11/28/2014  . Gastric reflux 11/28/2014  . Memory loss or impairment 11/28/2014  . Perennial allergic rhinitis with seasonal variation 11/28/2014  . Vitamin D deficiency 11/28/2014  . Tenosynovitis of thumb 11/28/2014  . Partial epilepsy with impairment of consciousness ([place]) 11/16/2014    Past Medical History:  Diagnosis Date  . Allergy   . Anxiety   . Atopy   . Chronic constipation   . Elevated LFTs   . Fatty liver   . GERD (gastroesophageal reflux disease)   . Hyperlipidemia   . Hypertension   . Hypoglycemia   . Insomnia   . Memory change   . Overweight   . Paresthesia of hand   . Seizure ([place])    12/12, 09/27/14, 02/28/16  . Tenosynovitis of finger   . Tinea cruris   . Vitamin D deficiency     Past Surgical History:  Procedure Laterality Date  . COLONOSCOPY WITH PROPOFOL N/A 03/25/2017   Procedure: COLONOSCOPY WITH PROPOFOL;  Surgeon: Lin Landsman, MD;  Location: [facility];  Service: Endoscopy;  Laterality: N/A;  . ESOPHAGOGASTRODUODENOSCOPY N/A 03/25/2017   Procedure: ESOPHAGOGASTRODUODENOSCOPY (EGD);  Surgeon: Lin Landsman, MD;  Location: [facility];  Service: Endoscopy;  Laterality: N/A;  Diabetic - oral meds  . LASIK Bilateral     Social History   Tobacco Use  . Smoking status: Former Smoker    Packs/day: 1.00    Years: 15.00    Pack  years: 15.00    Types: Cigarettes    Start date: 06/17/1984    Last attempt to quit: 11/28/1999    Years since quitting: 17.9  . Smokeless tobacco: Never Used  Substance Use Topics  . Alcohol use: No    Alcohol/week: 0.0 oz    Comment: used to drink a pack on weekends.      Current Outpatient Medications:  .  aspirin EC 81 MG tablet, Take 1 tablet (81 mg total) by mouth daily., Disp: 30 tablet, Rfl: 0 .  atorvastatin (LIPITOR) 40 MG tablet, Take 1 tablet (40 mg total) by mouth daily., Disp:  90 tablet, Rfl: 1 .  DULoxetine (CYMBALTA) 60 MG capsule, TAKE 1 CAPSULE BY MOUTH ONCE DAILY, Disp: 90 capsule, Rfl: 0 .  fluticasone (FLONASE) 50 MCG/ACT nasal spray, INSTILL 2 SPRAYS INTO EACH NOSTRIL AT BEDTIME IF NEEDED, Disp: 42 g, Rfl: 0 .  levETIRAcetam (KEPPRA) 250 MG tablet, Take 1 tablet (250 mg total) by mouth daily., Disp: 30 tablet, Rfl: 0 .  loratadine (CLARITIN) 10 MG tablet, Take 1 tablet (10 mg total) by mouth 2 (two) times daily., Disp: 180 tablet, Rfl: 1 .  metFORMIN (GLUCOPHAGE-XR) 750 MG 24 hr tablet, take 1 tablet by mouth once daily WITH BREAKFAST, Disp: 90 tablet, Rfl: 1 .  traZODone (DESYREL) 100 MG tablet, Take 1 tablet by mouth at bedtime., Disp: , Rfl: 0 .  Vitamin D, Ergocalciferol, (DRISDOL) 50000 units CAPS capsule, Take 1 capsule (50,000 Units total) by mouth every 7 (seven) days., Disp: 12 capsule, Rfl: 0 .  mirabegron ER (MYRBETRIQ) 25 MG TB24 tablet, Take 1 tablet (25 mg total) by mouth daily. (Patient not taking: Reported on 10/06/2017), Disp: 30 tablet, Rfl: 11 .  montelukast (SINGULAIR) 10 MG tablet, Take 1 tablet (10 mg total) by mouth daily. (Patient not taking: Reported on 11/17/2017), Disp: 90 tablet, Rfl: 1  Allergies  Allergen Reactions  . Ibuprofen Itching  . Levofloxacin   . Lisinopril Cough  . Metoprolol   . Nsaids Itching  . Tolmetin Itching  . Tramadol Hcl     ROS  Constitutional: Negative for fever or weight change.  Respiratory: Negative for cough and shortness of breath.   Cardiovascular: Negative for chest pain or palpitations.  Gastrointestinal: positive for abdominal pain after heavy meals, no bowel changes.  Musculoskeletal: Negative for gait problem or joint swelling.  Skin: Negative for rash.  Neurological: Negative for dizziness or headache.  No other specific complaints in a complete review of systems (except as listed in HPI above).  Objective  Vitals:   11/17/17 0912  BP: 110/70  Pulse: 100  Resp: 16  Temp: 98 F (36.7  C)  TempSrc: Oral  SpO2: 94%  Weight: 161 lb 4.8 oz (73.2 kg)  Height: 5\' 5"  (1.651 m)    Body mass index is 26.84 kg/m.  Nursing Note and Vital Signs reviewed.  Physical Exam  Constitutional: Patient appears well-developed and well-nourished. *No distress.  HEENT: head atraumatic, normocephalic, pupils equal and reactive to light, TM's without erythema or bulging, neck supple without lymphadenopathy, oropharynx pink and moist without exudate, no nasal discharge Cardiovascular: Normal rate, regular rhythm, S1/S2 present.  No murmur or rub heard.  Pulmonary/Chest: Effort normal and breath sounds clear. No respiratory distress or retractions. Abdominal: Soft with mild epigastric and midabd pain, bowel sounds present and hyperactive, no masses, palpated, negative Mcburney and murphys  Psychiatric: Patient has a normal mood and affect. behavior is normal. Judgment and thought content  normal.  No results found for this or any previous visit (from the past 72 hour(s)).  Assessment & Plan  1. Episode of moderate major depression (HCC) -stable continue medications.   2. Vitamin D deficiency -continue medications   3. Gastric reflux Discussed food and identifying triggers - ranitidine (RANITIDINE ACID REDUCER) 75 MG tablet; Take 1 tablet (75 mg total) by mouth 2 (two) times daily.  Dispense: 60 tablet; Refill: 2  4. Tinnitus of right ear - Ambulatory referral to ENT  5. Dyslipidemia Increase to 80mg , discussed diet  - atorvastatin (LIPITOR) 80 MG tablet; Take 1 tablet (80 mg total) by mouth daily at 6 PM.  Dispense: 30 tablet; Refill: 2    -Red flags and when to present for emergency care or RTC including fever >101.78F, chest pain, shortness of breath, new/worsening/un-resolving symptoms,  reviewed with patient at time of visit. Follow up and care instructions discussed and provided in AVS. -Reviewed Health Maintenance: need updated eye exam   I have reviewed this encounter  including the documentation in this note and/or discussed this patient with the provider, Suezanne Cheshire DNP AGNP-C. I am certifying that I agree with the content of this note as supervising physician. [name], MD [facility] 11/17/2017, 1:34 PM

## 2017-11-17 NOTE — Patient Instructions (Addendum)
- increased cholesterol medicine atorvastatin from 40mg  a day to 80mg  a day.   L?a Ch?n Th?c ?n cho B?nh Tro Ng??c D? Dy Th?c Qu?n, Ng??i L?n Food Choices for Gastroesophageal Reflux Disease, Adult Khi qu v? b? b?nh tro ng??c d? dy th?c qu?n (GERD), th?c ph?m qu v? ?n v thi quen ?n u?ng c?a qu v? l r?t quan tr?ng. L?a ch?n th?c ph?m ?ng c th? gip lm gi?m s? kh ch?u c?a b?nh tro ng??c d? dy th?c qu?n (GERD). Cn nh?c lm vi?c v?i m?t chuyn gia v? ch? ?? ?n v dinh d??ng (chuyn gia dinh d??ng) nh?m gip qu v? l?a ch?n nh?ng th?c ph?m t?t cho s?c kh?e. Nh?ng h??ng d?n chung no ti ph?i tun theo? K? ho?ch ?n u?ng  Ch?n nh?ng th?c ph?m t?t cho s?c kh?e t ch?t bo, ch?ng h?n nh? tri cy, rau c?, ng? c?c nguyn h?t, cc s?n ph?m t? s?a t bo, v th?t n?c, c, v th?t gia c?m.  ?n cc b?a nh?, th??ng xuyn thay v ba b?a l?n m?i ngy. ?n ch?m ri, trong khng gian th? gin. Trnh g?p ng??i ho?c n?m xu?ng cho ??n khi ?n xong ???c 2-3 gi?.  H?n ch? cc th?c ph?m giu ch?t bo ch?ng h?n nh? th?t nhi?u m? ho?c th?c ph?m chin.  Gi?i h?n l??ng tiu th? d?u, b?, v ch?t bo t? d?u th?c v?t ? m?c d??i 8 mu?ng c ph m?i ngy.  Tra?nh nh??ng th?? sau: ? Cc th?c ph?m gy ra cc tri?u ch?ng. Nh?ng tri?u ch?ng ny c th? khc nhau ??i v?i nh?ng ng??i khc nhau. Hy ghi nh?t k th?c ph?m ?? theo di nh?ng th?c ph?m no gy ra cc tri?u ch?ng. ? R??u. ? U?ng nhi?u ch?t l?ng khi ?n. ? ?n trong kho?ng 2-3 gi? tr??c khi ?i ng?.  N?u ?n b?ng cc ph??ng php khc thay vi? chin. Ph??ng php ny c th? bao g?m b? l, n??ng, ho?c hun nng. L?i s?ng   Duy tr m??c cn n?ng c l?i cho s?c kh?e. H?i chuyn gia ch?m [place] s?c kh?e c?a quy? vi? xem m??c cn n??ng na?o la? t?t cho quy? vi?. N?u qu v? c?n gi?m cn, hy trao ??i v?i chuyn gia ch?m [place] s?c kh?e c?a qu v? ?? gi?m cn m?t cch an ton.  T?p th? d?c t?i thi?u 30 pht t? 5 ngy tr? ln m?i tu?n, ho?c theo ch? d?n c?a chuyn gia ch?m  [place] s?c kh?e.  Trnh m?c qu?n o b ch?t quanh th?t l?ng v ng?c.  Khng s? d?ng b?t k? s?n ph?m no ch?a nicotine ho?c thu?c l, ch?ng ha?n nh? thu?c l d?ng ht v thu?c l ?i?n t?. N?u qu v? c?n gip ?? ?? cai thu?c, hy h?i chuyn gia ch?m [place] s?c kh?e.  Ng? v?i ??u gi??ng k cao. S? d?ng chm d??i n?m ho?c t?m k d??i khung gi??ng ?? nng ??u gi??ng ln. Nh?ng lo?i th?c ?n no khng ???c khuyn dng? Nh?ng m?c ???c li?t k c th? khng ph?i danh sch ??y ??. Hy trao ??i v?i bc s? chuyn khoa dinh d??ng xem cc l?a ch?n ch? ?? dinh d??ng no ph h?p nh?t v?i qu v?. Ng? c?c Bnh ng?t ho?c bnh m n??ng nhanh b? sung ch?t bo. Bnh m n??ng c?a Php. Rau c? Marlou Starks c? chin ng?p d?u. Khoai ty chin. M?i lo?i rau c? ???c ch? bi?n km thm ch?t bo. M?i lo?i rau c? gy ra cc tri?u ch?ng. ??i v?i  m?t s? ng??i, nh?ng lo?i rau c? ny c th? bao g?m c chua v cc s?n ph?m t? c chua, ?t, hnh v t?i, v c?i ng?a. Tri cy M?i lo?i tri cy ???c ch? bi?n km thm ch?t bo. M?i lo?i tri cy c th? gy ra cc tri?u ch?ng. ??i v?i m?t s? ng??i, nh?ng lo?i tri cy ny c th? bao g?m tri cy h? cam qut, ch?ng h?n nh? cam, b??i, qu? d?a, v chanh. Th?t v cc th?c ph?m ch?a protein khc Th?t giu ch?t bo, ch?ng h?n nh? th?t l?n ho?c th?t b nhi?u m?, xc xch, s??n, gi?m bng, l?p x??ng, [place] v th?t l?n mu?i xng khi. Th?t chin ho?c protein, bao g?m c chin v th?t g chin. Qu? h?ch v b? h?t. S?a. S?a nguyn kem v s?a s-c-la. Kem chuaSalome Spotted. Kem. Pho mt kem. S?a l?c. ?? u?ng C ph v tr, c ho?c khng c caffeine. ?? u?ng c ga. Soda. ?? u?ng t?ng l?c. N??c p tri cy lm t? tri cy chua (ch?ng h?n nh? cam ho?c b??i). N??c p c chua. ?? u?ng ch?a c?n. M? v d?u B?. B? th?c v?t. Ch?t bo t? d?u th?c v?t. B? s??a tru. K?o v ?? trng mi?ng S-c-la v c ca. Bnh rnCassell Smiles v? v cc th?c ph?m khc. H?t tiu. B?c h v b?c h l?c. M?i lo?i gia v?, th?o d??c, ho?c gia v? gy ra cc  tri?u ch?ng. ??i v?i m?t s? ng??i, gia v? v cc th?c ph?m khc c th? bao g?m c ri, n??c s?t nng, ho?c d?u d?m ?? tr?n salad. Tm t?t  Khi qu v? b? b?nh tro ng??c d? dy th?c qu?n (GERD), cc l?a ch?n th?c ph?m v l?i s?ng c vai tr r?t quan tr?ng ?? gip lm gi?m s? kh ch?u c?a GERD.  ?n cc b?a nh?, th??ng xuyn thay v ba b?a l?n m?i ngy. ?n ch?m ri, trong khng gian th? gin. Trnh g?p ng??i ho?c n?m xu?ng cho ??n khi ?n xong ???c 2-3 gi?.  H?n ch? cc th?c ph?m giu ch?t bo, ch?ng h?n nh? th?t m? ho?c th?c ph?m chin. Thng tin ny khng nh?m m?c ?ch thay th? cho l?i khuyn m chuyn gia ch?m [place] s?c kh?e ni v?i qu v?. Hy b?o ??m qu v? ph?i th?o lu?n b?t k? v?n ?? g m qu v? c v?i chuyn gia ch?m [place] s?c kh?e c?a qu v?. Document Released: 09/20/2016 Document Revised: 09/20/2016 Elsevier Interactive Patient Education  [employer].

## 2017-11-18 ENCOUNTER — Other Ambulatory Visit: Payer: Self-pay | Admitting: Nurse Practitioner

## 2017-11-18 DIAGNOSIS — E559 Vitamin D deficiency, unspecified: Secondary | ICD-10-CM

## 2017-11-18 LAB — VITAMIN D 25 HYDROXY (VIT D DEFICIENCY, FRACTURES): Vit D, 25-Hydroxy: 32 ng/mL (ref 30–100)

## 2017-11-18 MED ORDER — VITAMIN D (ERGOCALCIFEROL) 1.25 MG (50000 UNIT) PO CAPS: 50000.0000 [IU] | ORAL_CAPSULE | ORAL | 0 refills | Status: DC

## 2018-01-05 ENCOUNTER — Encounter: Payer: Self-pay | Admitting: Family Medicine

## 2018-01-05 ENCOUNTER — Ambulatory Visit: Payer: BLUE CROSS/BLUE SHIELD | Admitting: Family Medicine

## 2018-01-05 VITALS — BP 116/72 | HR 88 | Temp 97.6°F | Resp 18 | Ht 65.0 in | Wt 152.9 lb

## 2018-01-05 DIAGNOSIS — F1021 Alcohol dependence, in remission: Secondary | ICD-10-CM

## 2018-01-05 DIAGNOSIS — N521 Erectile dysfunction due to diseases classified elsewhere: Secondary | ICD-10-CM

## 2018-01-05 DIAGNOSIS — E785 Hyperlipidemia, unspecified: Secondary | ICD-10-CM

## 2018-01-05 DIAGNOSIS — F321 Major depressive disorder, single episode, moderate: Secondary | ICD-10-CM | POA: Diagnosis not present

## 2018-01-05 DIAGNOSIS — I10 Essential (primary) hypertension: Secondary | ICD-10-CM

## 2018-01-05 DIAGNOSIS — T63444A Toxic effect of venom of bees, undetermined, initial encounter: Secondary | ICD-10-CM

## 2018-01-05 DIAGNOSIS — J3089 Other allergic rhinitis: Secondary | ICD-10-CM

## 2018-01-05 DIAGNOSIS — E1140 Type 2 diabetes mellitus with diabetic neuropathy, unspecified: Secondary | ICD-10-CM

## 2018-01-05 DIAGNOSIS — K219 Gastro-esophageal reflux disease without esophagitis: Secondary | ICD-10-CM

## 2018-01-05 DIAGNOSIS — G4733 Obstructive sleep apnea (adult) (pediatric): Secondary | ICD-10-CM

## 2018-01-05 DIAGNOSIS — G40209 Localization-related (focal) (partial) symptomatic epilepsy and epileptic syndromes with complex partial seizures, not intractable, without status epilepticus: Secondary | ICD-10-CM

## 2018-01-05 DIAGNOSIS — J302 Other seasonal allergic rhinitis: Secondary | ICD-10-CM

## 2018-01-05 DIAGNOSIS — E559 Vitamin D deficiency, unspecified: Secondary | ICD-10-CM

## 2018-01-05 LAB — POCT UA - MICROALBUMIN: Microalbumin Ur, POC: 20 mg/L

## 2018-01-05 LAB — POCT GLYCOSYLATED HEMOGLOBIN (HGB A1C): HbA1c, POC (prediabetic range): 5.8 % (ref 5.7–6.4)

## 2018-01-05 MED ORDER — METFORMIN HCL ER 750 MG PO TB24: ORAL_TABLET | ORAL | 1 refills | Status: DC

## 2018-01-05 MED ORDER — ATORVASTATIN CALCIUM 80 MG PO TABS: 80.0000 mg | ORAL_TABLET | Freq: Every day | ORAL | 0 refills | Status: DC

## 2018-01-05 MED ORDER — LORATADINE 10 MG PO TABS: 10.0000 mg | ORAL_TABLET | Freq: Two times a day (BID) | ORAL | 1 refills | Status: DC

## 2018-01-05 MED ORDER — DULOXETINE HCL 60 MG PO CPEP: 60.0000 mg | ORAL_CAPSULE | Freq: Every day | ORAL | 1 refills | Status: DC

## 2018-01-05 MED ORDER — MONTELUKAST SODIUM 10 MG PO TABS: 10.0000 mg | ORAL_TABLET | Freq: Every day | ORAL | 1 refills | Status: DC

## 2018-01-05 NOTE — Progress Notes (Signed)
Name: David Skinner   MRN: [id number]    DOB: July 04, 1950   Date:01/05/2018       Progress Note  Subjective  Chief Complaint  Chief Complaint  Patient presents with  . Follow-up    6 week F/U  . Hyperlipidemia    [place] increased - increased cholesterol medicine atorvastatin from 40mg  a day to 80mg  a day. Only took 80 mg for one month  . Diabetes  . Hypertension    Denies any symptoms  . Depression  . 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 He sates he has been taking medication.    Hyperlipidemia:he is taking Atorvastatin but only at 40 mg and was supposed to be taking 80 mg daily. Denies side effects of medications.   OSA/minimal/RLS: he sees Dr Manuella Ghazi for memory loss and had sleep study2018it showed minimal sleep apnea and RLS - seeing Dr. Manuella Ghazi. He is now taking Trazodone and is sleeping well.   HTN:bp is at goal, no chest pain or palpitation  History of Alcoholism: he used to drink heavy while in [place] used to drink liquor at least 3 times a week and used to get drunk, when he moved to [place] ( 1979) . He was still drinking better but quit in 2017.   AR: he has been taking loratadine and singulair again and  very seldom has a cough now, no wheezing He smoked in the past but quit over 10 years ago.   Partial Seizure: Last episode 02/2016 and went to [facility],taking medication daily now and is seeing Dr. 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 He has episodes of polyphagia but denies polydipsia or polyuria. We will get a copy  of his last eye exam, he is not sure when he went, foot exam is up to date. He lost weight since last and has been more active, he can try stopping medication and recheck next visit    Patient Active Problem List   Diagnosis Date Noted  . OSA (obstructive sleep apnea) 12/09/2016  . RLS (restless legs syndrome) 12/09/2016  . Episode of moderate major depression ([place]) 12/09/2016  . History of alcoholism ([place]) 07/22/2016  . Cervical radiculitis 01/09/2016  . Lumbosacral radiculitis 01/09/2016  . Osteoarthritis of carpometacarpal (CMC) joint of thumb 01/09/2016  . Tinea cruris 03/21/2015  . 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 Depression with anxiety 11/28/2014  . Chronic constipation 11/28/2014  . Insomnia 11/28/2014  . Dyslipidemia 11/28/2014  . Fatty infiltration of liver 11/28/2014  . Essential (primary) hypertension 11/28/2014  . Gastric reflux 11/28/2014  . Memory loss or impairment 11/28/2014  . Perennial allergic rhinitis with seasonal variation 11/28/2014  . Vitamin D deficiency 11/28/2014  . Tenosynovitis of thumb 11/28/2014  . Partial epilepsy with impairment of consciousness ([place]) 11/16/2014    Past Surgical History:  Procedure Laterality Date  . COLONOSCOPY WITH PROPOFOL N/A 03/25/2017   Procedure: COLONOSCOPY WITH PROPOFOL;  Surgeon: Sherri Sear  Reece Levy, MD;  Location: [facility];  Service: Endoscopy;  Laterality: N/A;  . ESOPHAGOGASTRODUODENOSCOPY N/A 03/25/2017   Procedure: ESOPHAGOGASTRODUODENOSCOPY (EGD);  Surgeon: Lin Landsman, MD;  Location: [facility];  Service: Endoscopy;  Laterality: N/A;  Diabetic - oral meds  . LASIK Bilateral     Family History  Family history unknown: Yes    Social History   Socioeconomic History  . Marital status: Single    Spouse name: Not on file  . Number of children: Not on file  . Years of education: Not on file  . Highest education level: Not on  file  Occupational History  . Occupation: Scientist, forensic  Social Needs  . Financial resource strain: Not on file  . Food insecurity:    Worry: Not on file    Inability: Not on file  . Transportation needs:    Medical: Not on file    Non-medical: Not on file  Tobacco Use  . Smoking status: Former Smoker    Packs/day: 1.00    Years: 15.00    Pack years: 15.00    Types: Cigarettes    Start date: 06/17/1984    Last attempt to quit: 11/28/1999    Years since quitting: 18.1  . Smokeless tobacco: Never Used  Substance and Sexual Activity  . Alcohol use: No    Alcohol/week: 0.0 oz    Comment: used to drink a pack on weekends.   . Drug use: No  . Sexual activity: Yes    Partners: Female  Lifestyle  . Physical activity:    Days per week: Not on file    Minutes per session: Not on file  . Stress: Not on file  Relationships  . Social connections:    Talks on phone: Not on file    Gets together: Not on file    Attends religious service: Not on file    Active member of club or organization: Not on file    Attends meetings of clubs or organizations: Not on file    Relationship status: Not on file  . Intimate partner violence:    Fear of current or ex partner: Not on file    Emotionally abused: Not on file    Physically abused: Not on file    Forced sexual activity: Not on file  Other Topics Concern  . 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  aspirin EC 81 MG tablet, Take 1 tablet (81 mg total) by mouth daily., Disp: 30 tablet, Rfl: 0 .  atorvastatin (LIPITOR) 80 MG tablet, Take 1 tablet (80 mg total) by mouth daily. New dose, Disp: 90 tablet, Rfl: 0 .  DULoxetine (CYMBALTA) 60 MG capsule, Take 1 capsule (60 mg total) by mouth daily., Disp: 90 capsule, Rfl: 1 .  fluticasone (FLONASE) 50 MCG/ACT nasal spray, INSTILL 2 SPRAYS INTO EACH NOSTRIL AT BEDTIME IF NEEDED, Disp: 42 g, Rfl: 0 .   levETIRAcetam (KEPPRA) 250 MG tablet, Take 1 tablet (250 mg total) by mouth daily., Disp: 30 tablet, Rfl: 0 .  loratadine (CLARITIN) 10 MG tablet, Take 1 tablet (10 mg total) by mouth 2 (two) times daily., Disp: 180 tablet, Rfl: 1 .  metFORMIN (GLUCOPHAGE-XR) 750 MG 24 hr tablet, take 1 tablet by mouth once daily WITH BREAKFAST, Disp: 90 tablet, Rfl: 1 .  montelukast (SINGULAIR) 10 MG tablet, Take 1 tablet (10 mg total) by  mouth at bedtime., Disp: 90 tablet, Rfl: 1 .  ranitidine (RANITIDINE ACID REDUCER) 75 MG tablet, Take 1 tablet (75 mg total) by mouth 2 (two) times daily., Disp: 60 tablet, Rfl: 2 .  traZODone (DESYREL) 100 MG tablet, Take 1 tablet by mouth at bedtime., Disp: , Rfl: 0 .  Vitamin D, Ergocalciferol, (DRISDOL) 50000 units CAPS capsule, Take 1 capsule (50,000 Units total) by mouth every 7 (seven) days., Disp: 12 capsule, Rfl: 0  Allergies  Allergen Reactions  . Ibuprofen Itching  . Levofloxacin   . Lisinopril Cough  . Metoprolol   . Nsaids Itching  . Tolmetin Itching  . Tramadol Hcl      ROS  Constitutional: Negative for fever, positive for  weight change.  Respiratory: Negative for cough and shortness of breath.   Cardiovascular: Negative for chest pain or palpitations.  Gastrointestinal: Negative for abdominal pain, no bowel changes.  Musculoskeletal: Negative for gait problem or joint swelling.  Skin: positive  for rash/red swollen left hand   Neurological: Negative for dizziness or headache.  No other specific complaints in a complete review of systems (except as listed in HPI above).  Objective  Vitals:   01/05/18 0954  BP: 116/72  Pulse: 88  Resp: 18  Temp: 97.6 F (36.4 C)  TempSrc: Oral  SpO2: 98%  Weight: 152 lb 14.4 oz (69.4 kg)  Height: 5\' 5"  (1.651 m)    Body mass index is 25.44 kg/m.  Physical Exam  Constitutional: Patient appears well-developed and well-nourished. No distress.  HEENT: head atraumatic, normocephalic, pupils equal and  reactive to light,  neck supple, throat within normal limits Cardiovascular: Normal rate, regular rhythm and normal heart sounds.  No murmur heard. No BLE edema. Pulmonary/Chest: Effort normal and breath sounds normal. No respiratory distress. Abdominal: Soft.  There is no tenderness. Psychiatric: Patient has a normal mood and affect. behavior is normal. Judgment and thought content normal. Skin: left hand is red and swollen, per patient happened yesterday - bee sting- but resolving.   Recent Results (from the past 2160 hour(s))  VITAMIN D 25 Hydroxy (Vit-D Deficiency, Fractures)     Status: None   Collection Time: 11/17/17 10:23 AM  Result Value Ref Range   Vit D, 25-Hydroxy 32 30 - 100 ng/mL    Comment: Vitamin D Status         25-OH Vitamin D: . Deficiency:                    <20 ng/mL Insufficiency:             20 - 29 ng/mL Optimal:                 > or = 30 ng/mL . For 25-OH Vitamin D testing on patients on  D2-supplementation and patients for whom quantitation  of D2 and D3 fractions is required, the QuestAssureD(TM) 25-OH VIT D, (D2,D3), LC/MS/MS is recommended: order  code [id number] (patients >38yrs). . For more information on this test, go to: http://education.questdiagnostics.com/faq/FAQ163 (This link is being provided for  informational/educational purposes only.)   POCT HgB A1C     Status: Normal   Collection Time: 01/05/18 10:15 AM  Result Value Ref Range   Hemoglobin A1C  4.0 - 5.6 %   HbA1c POC (<> result, manual entry)  4.0 - 5.6 %   HbA1c, POC (prediabetic range) 5.8 5.7 - 6.4 %   HbA1c, POC (controlled diabetic range)  0.0 - 7.0 %  POCT  UA - Microalbumin     Status: Normal   Collection Time: 01/05/18 10:16 AM  Result Value Ref Range   Microalbumin Ur, POC 20 mg/L   Creatinine, POC  mg/dL   Albumin/Creatinine Ratio, Urine, POC       PHQ2/9: Depression screen [facility] 2/9 01/05/2018 11/17/2017 10/06/2017 08/13/2017 04/10/2017  Decreased Interest 0 1 2 3  0  Down,  Depressed, Hopeless 0 0 0 0 0  PHQ - 2 Score 0 1 2 3  0  Altered sleeping 0 0 1 1 -  Tired, decreased energy 0 1 0 3 -  Change in appetite 2 1 2 3  -  Feeling bad or failure about yourself  0 0 0 0 -  Trouble concentrating 0 0 0 3 -  Moving slowly or fidgety/restless 0 0 0 0 -  Suicidal thoughts 0 0 0 0 -  PHQ-9 Score 2 3 5 13  -  Difficult doing work/chores Somewhat difficult Not difficult at all Somewhat difficult Somewhat difficult -     Fall Risk: Fall Risk  01/05/2018 10/06/2017 08/13/2017 05/12/2017 04/10/2017  Falls in the past year? No No No No No  Number falls in past yr: - - - - -  Injury with Fall? - - - - -    Functional Status Survey: Is the patient deaf or have difficulty hearing?: No Does the patient have difficulty seeing, even when wearing glasses/contacts?: No Does the patient have difficulty concentrating, remembering, or making decisions?: Yes(Remembering) Does the patient have difficulty walking or climbing stairs?: No Does the patient have difficulty dressing or bathing?: No Does the patient have difficulty doing errands alone such as visiting a doctor's office or shopping?: No   Assessment & Plan  1. 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 Moderate major depression (HCC)  - DULoxetine (CYMBALTA) 60 MG capsule; Take 1 capsule (60 mg total) by mouth daily.  Dispense: 90 capsule; Refill: 1  3. Perennial allergic rhinitis with seasonal variation  - loratadine (CLARITIN) 10 MG tablet; Take 1 tablet (10 mg total) by mouth 2 (two) times daily.  Dispense: 180 tablet; Refill: 1 - montelukast (SINGULAIR) 10 MG tablet; Take 1 tablet (10 mg total) by mouth at bedtime.  Dispense: 90 tablet; Refill: 1  4.  Dyslipidemia  He will return in about 6 weeks, not taking 80 mg as prescribed yet  - COMPLETE METABOLIC PANEL WITH GFR - Lipid panel - atorvastatin (LIPITOR) 80 MG tablet; Take 1 tablet (80 mg total) by mouth daily. New dose  Dispense: 90 tablet; Refill: 0  5. Gastric reflux  Doing well on Ranitidine   6. OSA (obstructive sleep apnea)  Not using CPAP  7. Partial epilepsy with impairment of consciousness ([place])  No episodes recently   8. Essential (primary) hypertension  At goal   9. Vitamin D deficiency  Taking supplementation   10. History of alcoholism ([place])  Not currently drinking   11. Bee sting reaction, undetermined intent, initial encounter  - loratadine (CLARITIN) 10 MG tablet; Take 1 tablet (10 mg total) by mouth 2 (two) times daily.  Dispense: 180 tablet; Refill: 1 Advised baking soda paste and also benadryl for now

## 2018-01-06 ENCOUNTER — Encounter: Payer: Self-pay | Admitting: Family Medicine

## 2018-02-13 ENCOUNTER — Other Ambulatory Visit: Payer: Self-pay | Admitting: Family Medicine

## 2018-02-13 DIAGNOSIS — E1140 Type 2 diabetes mellitus with diabetic neuropathy, unspecified: Secondary | ICD-10-CM

## 2018-02-13 DIAGNOSIS — N521 Erectile dysfunction due to diseases classified elsewhere: Secondary | ICD-10-CM

## 2018-02-13 DIAGNOSIS — J302 Other seasonal allergic rhinitis: Secondary | ICD-10-CM

## 2018-02-13 DIAGNOSIS — J3089 Other allergic rhinitis: Principal | ICD-10-CM

## 2018-02-13 NOTE — Telephone Encounter (Signed)
Request for diabetes medication. Metformin to [facility] was given 90 on 01/05/2018  Also a refill on Flonase  Last office visit pertaining to diabetes: 01/05/2018  Lab Results  Component Value Date   HGBA1C 5.8 01/05/2018    Follow up on 04/08/2018

## 2018-03-01 ENCOUNTER — Other Ambulatory Visit: Payer: Self-pay | Admitting: Family Medicine

## 2018-03-01 DIAGNOSIS — F321 Major depressive disorder, single episode, moderate: Secondary | ICD-10-CM

## 2018-03-02 ENCOUNTER — Other Ambulatory Visit: Payer: Self-pay | Admitting: Family Medicine

## 2018-03-02 DIAGNOSIS — J3089 Other allergic rhinitis: Principal | ICD-10-CM

## 2018-03-02 DIAGNOSIS — J302 Other seasonal allergic rhinitis: Secondary | ICD-10-CM

## 2018-04-08 ENCOUNTER — Encounter: Payer: Self-pay | Admitting: Family Medicine

## 2018-04-08 ENCOUNTER — Ambulatory Visit: Payer: BLUE CROSS/BLUE SHIELD | Admitting: Family Medicine

## 2018-04-08 VITALS — BP 112/88 | HR 83 | Temp 97.8°F | Resp 16 | Ht 65.0 in | Wt 158.3 lb

## 2018-04-08 DIAGNOSIS — F325 Major depressive disorder, single episode, in full remission: Secondary | ICD-10-CM

## 2018-04-08 DIAGNOSIS — E1140 Type 2 diabetes mellitus with diabetic neuropathy, unspecified: Secondary | ICD-10-CM | POA: Diagnosis not present

## 2018-04-08 DIAGNOSIS — F1021 Alcohol dependence, in remission: Secondary | ICD-10-CM | POA: Diagnosis not present

## 2018-04-08 DIAGNOSIS — G40209 Localization-related (focal) (partial) symptomatic epilepsy and epileptic syndromes with complex partial seizures, not intractable, without status epilepticus: Secondary | ICD-10-CM

## 2018-04-08 DIAGNOSIS — J302 Other seasonal allergic rhinitis: Secondary | ICD-10-CM

## 2018-04-08 DIAGNOSIS — N521 Erectile dysfunction due to diseases classified elsewhere: Secondary | ICD-10-CM | POA: Diagnosis not present

## 2018-04-08 DIAGNOSIS — E559 Vitamin D deficiency, unspecified: Secondary | ICD-10-CM

## 2018-04-08 DIAGNOSIS — Z23 Encounter for immunization: Secondary | ICD-10-CM | POA: Diagnosis not present

## 2018-04-08 DIAGNOSIS — E785 Hyperlipidemia, unspecified: Secondary | ICD-10-CM

## 2018-04-08 DIAGNOSIS — I10 Essential (primary) hypertension: Secondary | ICD-10-CM

## 2018-04-08 DIAGNOSIS — J3089 Other allergic rhinitis: Secondary | ICD-10-CM

## 2018-04-08 DIAGNOSIS — G4733 Obstructive sleep apnea (adult) (pediatric): Secondary | ICD-10-CM

## 2018-04-08 LAB — POCT GLYCOSYLATED HEMOGLOBIN (HGB A1C): HbA1c, POC (controlled diabetic range): 6 % (ref 0.0–7.0)

## 2018-04-08 MED ORDER — MONTELUKAST SODIUM 10 MG PO TABS: 10.0000 mg | ORAL_TABLET | Freq: Every day | ORAL | 0 refills | Status: DC

## 2018-04-08 MED ORDER — VITAMIN D (ERGOCALCIFEROL) 1.25 MG (50000 UNIT) PO CAPS: 50000.0000 [IU] | ORAL_CAPSULE | ORAL | 0 refills | Status: DC

## 2018-04-08 MED ORDER — ATORVASTATIN CALCIUM 80 MG PO TABS: 80.0000 mg | ORAL_TABLET | Freq: Every day | ORAL | 0 refills | Status: DC

## 2018-04-08 MED ORDER — DULOXETINE HCL 60 MG PO CPEP: 60.0000 mg | ORAL_CAPSULE | Freq: Every day | ORAL | 1 refills | Status: DC

## 2018-04-08 NOTE — Progress Notes (Signed)
Name: David Skinner   MRN: [id number]    DOB: 1951-01-14   Date:04/08/2018       Progress Note  Subjective  Chief Complaint  Chief Complaint  Patient presents with  . Medication Refill  . Diabetes  . Hypertension  . Hyperlipidemia  . 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 He sates he has been taking medication as prescribed and denies side effects of medication .   Hyperlipidemia:he is taking Atorvastatin but only at 80 mg daily and we will recheck labs today No myalgia, no chest pain   OSA/minimal/RLS: he sees Dr Manuella Ghazi for memory loss and had sleep study2018it showed minimal sleep apnea and RLS -seeing Dr. Manuella Ghazi. He is now taking Trazodone and is sleeping well. Needs to follow up with Dr. Manuella Ghazi   HTN:bp is at goal, no chest pain or palpitation. BP seems to no longer be a problem for him   History of Alcoholism: he used to drink heavy while in [place] used to drink liquor at least 3 times a week and used to get drunk, when he moved to [place] ( 1979) . He was still drinking better but quit in 2017. BP has been back to normal now.   AR: he has been taking loratadine and singulair again and  very seldom has a cough now, no wheezing He smoked in the past but quit over 10 years ago, explained that he may still have COPD but since symptoms are mild and intermittent we will hold off on spirometry .   Partial Seizure: Last episode 02/2016 and went to [facility],taking medication daily now and is seeing Dr. Manuella Ghazi. No recent episodes . He is building a gazebo at his house. Explained that he needs to avoid climbing stairs especially when alone at home  Diabetes Type 2 diet controlled with ED, hgbA1C hadgone up to 6.9% and we started Metformin back in  10/2015, down to 5.8% last visit, today hgbA1C is 6% and he has been off Metformin for the past 3 months. He has episodes of polyphagia but denies polydipsia or polyuria, occasionally he feels shaky and improves with a snack.  Patient Active Problem List   Diagnosis Date Noted  . OSA (obstructive sleep apnea) 12/09/2016  . RLS (restless legs syndrome) 12/09/2016  . Episode of moderate major depression ([place]) 12/09/2016  . History of alcoholism ([place]) 07/22/2016  . Cervical radiculitis 01/09/2016  . Lumbosacral radiculitis 01/09/2016  . Osteoarthritis of carpometacarpal (CMC) joint of thumb 01/09/2016  . Tinea cruris 03/21/2015  . 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 Depression with anxiety 11/28/2014  . Chronic constipation 11/28/2014  . Insomnia 11/28/2014  . Dyslipidemia 11/28/2014  . Fatty infiltration of liver 11/28/2014  . Essential (primary) hypertension 11/28/2014  . Gastric reflux 11/28/2014  . Memory loss or impairment 11/28/2014  . Perennial allergic rhinitis with seasonal variation 11/28/2014  . Vitamin D deficiency 11/28/2014  . Tenosynovitis of thumb 11/28/2014  . Partial epilepsy with impairment of consciousness ([place]) 11/16/2014    Past Surgical History:  Procedure Laterality Date  . COLONOSCOPY WITH PROPOFOL N/A 03/25/2017   Procedure: COLONOSCOPY WITH PROPOFOL;  Surgeon: Lin Landsman, MD;  Location: [facility];  Service: Endoscopy;  Laterality: N/A;  . ESOPHAGOGASTRODUODENOSCOPY N/A 03/25/2017   Procedure: ESOPHAGOGASTRODUODENOSCOPY (EGD);  Surgeon: Lin Landsman, MD;  Location: [facility];  Service: Endoscopy;  Laterality: N/A;  Diabetic - oral meds  . LASIK Bilateral     Family History  Family history unknown: Yes    Social History   Socioeconomic History  . Marital status: Significant Other    Spouse name: Not on file  . Number of children: 3  . Years of education: Not on  file  . Highest education level: 12th grade  Occupational History  . Occupation: retired   Scientific laboratory technician  . Financial resource strain: Not hard at all  . Food insecurity:    Worry: Never true    Inability: Never true  . Transportation needs:    Medical: No    Non-medical: No  Tobacco Use  . Smoking status: Former Smoker    Packs/day: 1.00    Years: 15.00    Pack years: 15.00    Types: Cigarettes    Start date: 06/17/1984    Last attempt to quit: 11/28/1999    Years since quitting: 18.3  . Smokeless tobacco: Never Used  Substance and Sexual Activity  . Alcohol use: No    Alcohol/week: 0.0 standard drinks    Comment: used to drink a pack on weekends.   . Drug use: No  . Sexual activity: Yes    Partners: Female  Lifestyle  . Physical activity:    Days per week: 7 days    Minutes per session: 150+ min  . Stress: Not at all  Relationships  . Social connections:    Talks on phone: Not on file    Gets together: Not on file    Attends religious service: Not on file    Active member of club or organization: Not on file    Attends meetings of clubs or organizations: Not on file    Relationship status: Not on file  . Intimate partner violence:    Fear of current or ex partner: No    Emotionally abused: No    Physically abused: No    Forced sexual activity: No  Other Topics Concern  . Not on file  Social History Narrative   Originally from [place]      Current Outpatient Medications:  .  aspirin EC 81 MG tablet, Take 1 tablet (81 mg total) by mouth daily., Disp: 30 tablet, Rfl: 0 .  atorvastatin (LIPITOR) 80 MG tablet, Take 1 tablet (80 mg total) by mouth daily. New dose, Disp: 90 tablet, Rfl: 0 .  DULoxetine (CYMBALTA) 60 MG capsule, Take 1 capsule (60 mg total) by mouth daily., Disp: 90 capsule, Rfl: 1 .  fluticasone (FLONASE) 50 MCG/ACT nasal spray, INSTILL 2 SPRAYS INTO EACH NOSTRIL AT BEDTIME IF NEEDED, Disp: 42 g, Rfl: 0 .  levETIRAcetam (KEPPRA) 250 MG tablet, Take 1  tablet (250 mg total) by mouth daily., Disp: 30 tablet, Rfl: 0 .  loratadine (CLARITIN) 10 MG tablet, Take 1 tablet (10 mg total) by mouth 2 (two) times daily., Disp: 180 tablet, Rfl: 1 .  montelukast (SINGULAIR) 10 MG tablet, Take 1 tablet (10 mg total) by mouth daily., Disp: 90 tablet, Rfl: 0 .  ranitidine (RANITIDINE ACID REDUCER) 75 MG tablet, Take 1 tablet (75 mg total) by mouth 2 (two) times daily., Disp: 60 tablet, Rfl: 2 .  traZODone (DESYREL) 100 MG tablet, Take 1 tablet by mouth at bedtime., Disp: , Rfl: 0 .  Vitamin D, Ergocalciferol, (DRISDOL) 50000 units CAPS capsule, Take 1 capsule (50,000 Units total) by mouth every 7 (seven) days., Disp: 12 capsule, Rfl: 0  Allergies  Allergen Reactions  . Ibuprofen Itching  . Levofloxacin   . Lisinopril Cough  . Metoprolol   . Nsaids Itching  . Tolmetin Itching  . Tramadol Hcl     I personally reviewed active problem list, medication list, allergies, family history, social history with the patient/caregiver today.   ROS  Constitutional: Negative for fever or weight change.  Respiratory: positive for intermittent cough but no  shortness of breath.   Cardiovascular: Negative for chest pain or palpitations.  Gastrointestinal: Negative for abdominal pain, no bowel changes.  Musculoskeletal: Negative for gait problem or joint swelling.  Skin: Negative for rash.  Neurological: Negative for dizziness or headache.  No other specific complaints in a complete review of systems (except as listed in HPI above).   Objective  Vitals:   04/08/18 0921  BP: 112/88  Pulse: 83  Resp: 16  Temp: 97.8 F (36.6 C)  TempSrc: Oral  SpO2: 97%  Weight: 158 lb 4.8 oz (71.8 kg)  Height: 5\' 5"  (1.651 m)    Body mass index is 26.34 kg/m.  Physical Exam  Constitutional: Patient appears well-developed and well-nourished. Overweight. No distress.  HEENT: head atraumatic, normocephalic, pupils equal and reactive to light, neck supple, throat  within normal limits Cardiovascular: Normal rate, regular rhythm and normal heart sounds.  No murmur heard. No BLE edema. Pulmonary/Chest: Effort normal and breath sounds normal. No respiratory distress. Abdominal: Soft.  There is no tenderness. Psychiatric: Patient has a normal mood and affect. behavior is normal. Judgment and thought content normal.  PHQ2/9: Depression screen [facility] 2/9 04/08/2018 01/05/2018 11/17/2017 10/06/2017 08/13/2017  Decreased Interest 0 0 1 2 3   Down, Depressed, Hopeless 0 0 0 0 0  PHQ - 2 Score 0 0 1 2 3   Altered sleeping 0 0 0 1 1  Tired, decreased energy 0 0 1 0 3  Change in appetite 0 2 1 2 3   Feeling bad or failure about yourself  0 0 0 0 0  Trouble concentrating 0 0 0 0 3  Moving slowly or fidgety/restless 0 0 0 0 0  Suicidal thoughts 0 0 0 0 0  PHQ-9 Score 0 2 3 5 13   Difficult doing work/chores Not difficult at all Somewhat difficult Not difficult at all Somewhat difficult Somewhat difficult     Fall Risk: Fall Risk  04/08/2018 01/05/2018 10/06/2017 08/13/2017 05/12/2017  Falls in the past year? No No No No No  Number falls in past yr: - - - - -  Injury with Fall? - - - - -     Functional Status Survey: Is the patient deaf or have difficulty hearing?: Yes(hard of hearing) Does the patient have difficulty seeing, even when wearing glasses/contacts?: No Does the patient have difficulty concentrating, remembering, or making decisions?: Yes Does the patient have difficulty walking or climbing stairs?: No Does the patient have difficulty dressing or bathing?: No Does the patient have difficulty doing errands alone such as visiting a doctor's office or shopping?: No   Assessment & Plan  1. 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 Need for immunization against influenza  - Flu vaccine HIGH DOSE PF (Fluzone High dose)  3. Need for vaccination for pneumococcus  - Pneumococcal conjugate vaccine  13-valent IM  4. Major depression in  remission ([place])  Doing well now   5. History of alcoholism ([place])  He has not been drinking alcohol anymore   6. Dyslipidemia  - atorvastatin (LIPITOR) 80 MG tablet; Take 1 tablet (80 mg total) by mouth daily. New dose  Dispense: 90 tablet; Refill: 0  7. Partial epilepsy with impairment of consciousness ([place])  Keep follow up with Dr. Manuella Ghazi   8. Essential (primary) hypertension  At goal today   9. OSA (obstructive sleep apnea)  He never used a CPAP   10. Perennial allergic rhinitis with seasonal variation  - montelukast (SINGULAIR) 10 MG tablet; Take 1 tablet (10 mg total) by mouth daily.  Dispense: 90 tablet; Refill: 0  11. Vitamin D deficiency  - Vitamin D, Ergocalciferol, (DRISDOL) 50000 units CAPS capsule; Take 1 capsule (50,000 Units total) by mouth every 7 (seven) days.  Dispense: 12 capsule; Refill: 0

## 2018-04-09 LAB — COMPLETE METABOLIC PANEL WITH GFR
AG RATIO: 1.5 (calc) (ref 1.0–2.5)
ALBUMIN MSPROF: 4.5 g/dL (ref 3.6–5.1)
ALKALINE PHOSPHATASE (APISO): 61 U/L (ref 40–115)
ALT: 45 U/L (ref 9–46)
AST: 33 U/L (ref 10–35)
BUN: 16 mg/dL (ref 7–25)
CALCIUM: 9.6 mg/dL (ref 8.6–10.3)
CO2: 30 mmol/L (ref 20–32)
Chloride: 101 mmol/L (ref 98–110)
Creat: 0.92 mg/dL (ref 0.70–1.25)
GFR, EST NON AFRICAN AMERICAN: 87 mL/min/{1.73_m2} (ref >=60)
GFR, Est African American: 101 mL/min/{1.73_m2} (ref >=60)
GLOBULIN: 3 g/dL (ref 1.9–3.7)
Glucose, Bld: 93 mg/dL (ref 65–99)
POTASSIUM: 4.3 mmol/L (ref 3.5–5.3)
SODIUM: 138 mmol/L (ref 135–146)
Total Bilirubin: 0.8 mg/dL (ref 0.2–1.2)
Total Protein: 7.5 g/dL (ref 6.1–8.1)

## 2018-04-09 LAB — LIPID PANEL
Cholesterol: 163 mg/dL (ref <200)
HDL: 60 mg/dL (ref >40)
LDL Cholesterol (Calc): 85 mg/dL (calc)
Non-HDL Cholesterol (Calc): 103 mg/dL (calc) (ref <130)
Total CHOL/HDL Ratio: 2.7 (calc) (ref <5.0)
Triglycerides: 87 mg/dL (ref <150)

## 2018-04-14 LAB — HM DIABETES EYE EXAM

## 2018-04-15 ENCOUNTER — Encounter: Payer: Self-pay | Admitting: Family Medicine

## 2018-05-04 ENCOUNTER — Encounter: Payer: Self-pay | Admitting: Family Medicine

## 2018-05-04 ENCOUNTER — Ambulatory Visit (INDEPENDENT_AMBULATORY_CARE_PROVIDER_SITE_OTHER): Payer: Medicare Other | Admitting: Family Medicine

## 2018-05-04 VITALS — BP 118/68 | HR 96 | Temp 97.8°F | Resp 16 | Ht 65.0 in | Wt 155.7 lb

## 2018-05-04 DIAGNOSIS — R94120 Abnormal auditory function study: Secondary | ICD-10-CM

## 2018-05-04 DIAGNOSIS — Z0000 Encounter for general adult medical examination without abnormal findings: Secondary | ICD-10-CM | POA: Diagnosis not present

## 2018-05-04 DIAGNOSIS — R2689 Other abnormalities of gait and mobility: Secondary | ICD-10-CM | POA: Diagnosis not present

## 2018-05-04 DIAGNOSIS — N3281 Overactive bladder: Secondary | ICD-10-CM | POA: Diagnosis not present

## 2018-05-04 DIAGNOSIS — Z87891 Personal history of nicotine dependence: Secondary | ICD-10-CM

## 2018-05-04 NOTE — Progress Notes (Signed)
Patient: David Skinner, Male    DOB: [birth year], 67 y.o.   MRN: [id number]  Visit Date: 05/04/2018  Today's Provider: Loistine Chance, MD   Chief Complaint  Patient presents with  . Welcome to Medicare    Subjective:   David Skinner is a 67 y.o. male who presents today for his Subsequent Annual Wellness Visit. Speaks [place] and translator was here with him  Patient/Caregiver input:  Still has bladder problems, advised to go back to Dr. Bernardo Heater, he has also noticed problems with balance and explained he needs to go sooner to neurologist   HPI:   Urinary incontinence: he was given myrbetriq by Dr. Ashok Cordia, but states while on the medication he had nocturnal enuresis and since he stopped medications months ago , no longer has problems. Explained he needs to follow up with him since he has a high AUA score  Balance problems: going on for about one year, but he said he did not talk to neurologist about it. He states getting more frequency, no dizziness or tinnitus. We will place referral for him to see Dr. Manuella Ghazi again    Review of Systems   Constitutional: Negative for fever or weight change.  Respiratory: Negative for cough and shortness of breath.   Cardiovascular: Negative for chest pain or palpitations.  Gastrointestinal: Negative for abdominal pain, no bowel changes.  Musculoskeletal: Negative for gait problem or joint swelling.  Skin: Negative for rash.  Neurological: Negative for dizziness or headache.  No other specific complaints in a complete review of systems (except as listed in HPI above).  Past Medical History:  Diagnosis Date  . Allergy   . Anxiety   . Atopy   . Cataract of right eye    [facility]  . Chronic constipation   . Elevated LFTs   . Fatty liver   . GERD (gastroesophageal reflux disease)   . Hyperlipidemia   . Hypertension   . Hypoglycemia   . Insomnia   . Memory change   . Overweight   . Paresthesia of hand   . Seizure ([place])    12/12, 09/27/14, 02/28/16  . Tenosynovitis of finger   . Tinea cruris   . Vitamin D deficiency     Past Surgical History:  Procedure Laterality Date  . COLONOSCOPY WITH PROPOFOL N/A 03/25/2017   Procedure: COLONOSCOPY WITH PROPOFOL;  Surgeon: Lin Landsman, MD;  Location: [place];  Service: Endoscopy;  Laterality: N/A;  . ESOPHAGOGASTRODUODENOSCOPY N/A 03/25/2017   Procedure: ESOPHAGOGASTRODUODENOSCOPY (EGD);  Surgeon: Lin Landsman, MD;  Location: [place];  Service: Endoscopy;  Laterality: N/A;  Diabetic - oral meds  . LASIK Bilateral     Family History  Family history unknown: Yes    Social History   Socioeconomic History  . Marital status: Significant Other    Spouse name: [name]  . Number of children: 3  . Years of education: Not on file  . Highest education level: 12th grade  Occupational History  . Occupation: Retired   Scientific laboratory technician  . Financial resource strain: Not hard at all  . Food insecurity:    Worry: Never true    Inability: Never true  . Transportation needs:    Medical: No    Non-medical: No  Tobacco Use  . Smoking status: Former Smoker    Packs/day: 1.00    Years: 15.00    Pack years: 15.00    Types: Cigarettes    Start date: 06/17/1984  Last attempt to quit: 11/28/1999    Years since quitting: 18.4  . Smokeless tobacco: Never Used  Substance and Sexual Activity  . Alcohol use: No    Alcohol/week: 0.0 standard drinks    Comment: used to drink a pack on weekends.   . Drug use: No  . Sexual activity: Yes    Partners: Female  Lifestyle  . Physical activity:    Days per week: 7 days    Minutes per session: 60 min  . Stress: Not at all  Relationships  . Social connections:    Talks on phone: More than three times a week    Gets together: More than three times a week    Attends religious service: 1 to 4 times per year    Active member of club or organization: No    Attends meetings of clubs or organizations:  Never    Relationship status: Living with partner  . Intimate partner violence:    Fear of current or ex partner: No    Emotionally abused: No    Physically abused: No    Forced sexual activity: No  Other Topics Concern  . Not on file  Social History Narrative   Originally from [place]     Outpatient Encounter Medications as of 05/04/2018  Medication Sig Note  . acetaminophen (TYLENOL) 325 MG tablet Take 650 mg by mouth every 6 (six) hours as needed for moderate pain.   Marland Kitchen aspirin EC 81 MG tablet Take 1 tablet (81 mg total) by mouth daily.   Marland Kitchen atorvastatin (LIPITOR) 80 MG tablet Take 1 tablet (80 mg total) by mouth daily. New dose   . DULoxetine (CYMBALTA) 60 MG capsule Take 1 capsule (60 mg total) by mouth daily.   . fluticasone (FLONASE) 50 MCG/ACT nasal spray INSTILL 2 SPRAYS INTO EACH NOSTRIL AT BEDTIME IF NEEDED   . levETIRAcetam (KEPPRA) 250 MG tablet Take 1 tablet (250 mg total) by mouth daily.   Marland Kitchen loratadine (CLARITIN) 10 MG tablet Take 1 tablet (10 mg total) by mouth 2 (two) times daily.   . traZODone (DESYREL) 100 MG tablet Take 1 tablet by mouth at bedtime.   . Vitamin D, Ergocalciferol, (DRISDOL) 50000 units CAPS capsule Take 1 capsule (50,000 Units total) by mouth every 7 (seven) days.   . montelukast (SINGULAIR) 10 MG tablet Take 1 tablet (10 mg total) by mouth daily. (Patient not taking: Reported on 05/04/2018) 05/04/2018: Needs Refill   . ranitidine (RANITIDINE ACID REDUCER) 75 MG tablet Take 1 tablet (75 mg total) by mouth 2 (two) times daily. (Patient not taking: Reported on 05/04/2018)    No facility-administered encounter medications on file as of 05/04/2018.     Allergies  Allergen Reactions  . Ibuprofen Itching  . Levofloxacin   . Lisinopril Cough  . Metoprolol   . Nsaids Itching  . Tolmetin Itching  . Tramadol Hcl Itching    Care Team Updated in EHR: Yes  Last Vision Exam: End of October 2019 at [facility] Wears corrective lenses: No Last  Dental Exam: Has not had a recent one Last Hearing Exam: No Wears Hearing Aids: No  Functional Ability / Safety Screening 1.  Was the timed Get Up and Go test longer than 30 seconds?  yes 2.  Does the patient need help with the phone, transportation, shopping,      preparing meals, housework, laundry, medications, or managing money?  no 3.  Does the patient's home have:  loose throw rugs  in the hallway?   no      Grab bars in the bathroom? no      Handrails on the stairs?   yes      Poor lighting?   no 4.  Has the patient noticed any hearing difficulties?   yes  Diet Recall and Exercise Regimen:   Fall Risk: See screening under Objective Information  Depression Screen: See screening under Objective Information  Advanced Directives: A voluntary discussion about advance care planning including the explanation and discussion of advance directives was discussed with the patient. Explanation about the health care proxy and living will was reviewed.  During this discussion, the patient was able to identify a health care proxy as [name] Does patient have a HCPOA?    no If yes, name and contact information:  Does patient have a living will or MOST form?  no  Cancer Screenings: Skin: discussed atypical lesions  Lung: Low Dose CT Chest recommended if Age 43-80 years, 30 pack-year currently smoking OR have quit w/in 15years. Patient does not qualify.  Lifestyle risk factor issued reviewed: Diet, exercise, weight management, advised patient smoking is not healthy, nutrition/diet.   Prostate: follow up with Dr. Bernardo Heater  Colon: up to date  Additional Screenings: Hepatitis B/HIV/Syphillis: not interested  Hepatitis C Screening: up to date Intimate Partner Violence: negative screen  AAA Screen: Men age 59 to 9 years if ever smoked recommended to get a one time AAA ultrasound screening exam. Patient does qualify.  IPSS Questionnaire (AUA-7): Over the past month.   1)  How often have you had a  sensation of not emptying your bladder completely after you finish urinating?  4 - More than half the time  2)  How often have you had to urinate again less than two hours after you finished urinating? 3 - About half the time  3)  How often have you found you stopped and started again several times when you urinated?  5 - Almost always  4) How difficult have you found it to postpone urination?  3 - About half the time  5) How often have you had a weak urinary stream?  4 - More than half the time  6) How often have you had to push or strain to begin urination?  2 - Less than half the time  7) How many times did you most typically get up to urinate from the time you went to bed until the time you got up in the morning?  4 - 4 times  Total score:  0-7 mildly symptomatic   8-19 moderately symptomatic   20-35 severely symptomatic    Objective:   Vitals: BP 118/68 (BP Location: Right Arm, Patient Position: Sitting, Cuff Size: Normal)   Pulse 96   Temp 97.8 F (36.6 C) (Oral)   Resp 16   Ht 5\' 5"  (1.651 m)   Wt 155 lb 11.2 oz (70.6 kg)   SpO2 99%   BMI 25.91 kg/m  Body mass index is 25.91 kg/m.  Lab Results  Component Value Date   CHOL 163 04/08/2018   CHOL 210 (H) 10/06/2017   CHOL 259 (H) 08/13/2017   Lab Results  Component Value Date   HDL 60 04/08/2018   HDL 64 10/06/2017   HDL 57 08/13/2017   Lab Results  Component Value Date   LDLCALC 85 04/08/2018   LDLCALC 122 (H) 10/06/2017   LDLCALC 156 (H) 08/13/2017   Lab Results  Component Value Date  TRIG 87 04/08/2018   TRIG 129 10/06/2017   TRIG 278 (H) 08/13/2017   Lab Results  Component Value Date   CHOLHDL 2.7 04/08/2018   CHOLHDL 3.3 10/06/2017   CHOLHDL 4.5 08/13/2017   No results found for: LDLDIRECT   Hearing Screening   Method: Audiometry   125Hz  250Hz  500Hz  1000Hz  2000Hz  3000Hz  4000Hz  6000Hz  8000Hz   Right ear:   Fail Pass Pass  Pass    Left ear:   Fail Pass Pass  Fail      Visual Acuity Screening    Right eye Left eye Both eyes  Without correction: 20/40 20/70 20/20   With correction:       Physical Exam   Constitutional: Patient appears well-developed and well-nourished.  No distress.  HEENT: head atraumatic, normocephalic, pupils equal and reactive to light,  neck supple, throat within normal limits Cardiovascular: Normal rate, regular rhythm and normal heart sounds.  No murmur heard. No BLE edema. Pulmonary/Chest: Effort normal and breath sounds normal. No respiratory distress. Abdominal: Soft.  There is no tenderness. Psychiatric: Patient has a normal mood and affect. behavior is normal. Judgment and thought content normal. Neurological: no focal findings, romberg negative, normal finger nose and alternate movements in our office today, cranial nerves intact   Cognitive Testing - 6-CIT  Correct? Score   What year is it? yes 0 Yes = 0    No = 4  What month is it? yes 0 Yes = 0    No = 3  Remember:     [place], [place], [place]     What time is it? yes 0 Yes = 0    No = 3  Count backwards from 20 to 1 yes 0 Correct = 0    1 error = 2   More than 1 error = 4  Say the months of the year in reverse. yes 0 Correct = 0    1 error = 2   More than 1 error = 4  What address did I ask you to remember? no 3 Correct = 0  1 error = 2    2 error = 4    3 error = 6    4 error = 8    All wrong = 10       TOTAL SCORE  3/28   Interpretation:  Normal  Normal (0-7) Abnormal (8-28)   Fall Risk: Fall Risk  05/04/2018 04/08/2018 01/05/2018 10/06/2017 08/13/2017  Falls in the past year? 0 No No No No  Number falls in past yr: 0 - - - -  Injury with Fall? 0 - - - -    Depression Screen Depression screen [facility] 2/9 05/04/2018 04/08/2018 01/05/2018 11/17/2017 10/06/2017  Decreased Interest 0 0 0 1 2  Down, Depressed, Hopeless 0 0 0 0 0  PHQ - 2 Score 0 0 0 1 2  Altered sleeping 0 0 0 0 1  Tired, decreased energy 0 0 0 1 0  Change in appetite 0 0 2 1 2   Feeling bad or failure about yourself   0 0 0 0 0  Trouble concentrating 0 0 0 0 0  Moving slowly or fidgety/restless 0 0 0 0 0  Suicidal thoughts 0 0 0 0 0  PHQ-9 Score 0 0 2 3 5   Difficult doing work/chores Not difficult at all Not difficult at all Somewhat difficult Not difficult at all Somewhat difficult  Some recent data might be hidden  Recent Results (from the past 2160 hour(s))  COMPLETE METABOLIC PANEL WITH GFR     Status: None   Collection Time: 04/08/18 10:23 AM  Result Value Ref Range   Glucose, Bld 93 65 - 99 mg/dL    Comment: .            Fasting reference interval .    BUN 16 7 - 25 mg/dL   Creat 0.92 0.70 - 1.25 mg/dL    Comment: For patients >70 years of age, the reference limit for Creatinine is approximately 13% higher for people identified as African-American. .    GFR, Est Non African American 87 > OR = 60 mL/min/1.59m2   GFR, Est African American 101 > OR = 60 mL/min/1.70m2   BUN/Creatinine Ratio NOT APPLICABLE 6 - 22 (calc)   Sodium 138 135 - 146 mmol/L   Potassium 4.3 3.5 - 5.3 mmol/L   Chloride 101 98 - 110 mmol/L   CO2 30 20 - 32 mmol/L   Calcium 9.6 8.6 - 10.3 mg/dL   Total Protein 7.5 6.1 - 8.1 g/dL   Albumin 4.5 3.6 - 5.1 g/dL   Globulin 3.0 1.9 - 3.7 g/dL (calc)   AG Ratio 1.5 1.0 - 2.5 (calc)   Total Bilirubin 0.8 0.2 - 1.2 mg/dL   Alkaline phosphatase (APISO) 61 40 - 115 U/L   AST 33 10 - 35 U/L   ALT 45 9 - 46 U/L  Lipid panel     Status: None   Collection Time: 04/08/18 10:23 AM  Result Value Ref Range   Cholesterol 163 <200 mg/dL   HDL 60 >40 mg/dL   Triglycerides 87 <150 mg/dL   LDL Cholesterol (Calc) 85 mg/dL (calc)    Comment: Reference range: <100 . Desirable range <100 mg/dL for primary prevention;   <70 mg/dL for patients with CHD or diabetic patients  with > or = 2 CHD risk factors. Marland Kitchen LDL-C is now calculated using the Martin-Hopkins  calculation, which is a validated novel method providing  better accuracy than the Friedewald equation in the  estimation of  LDL-C.  Cresenciano Genre et al. Annamaria Helling. 5035;465(68): 2061-2068  (http://education.[facility].com/faq/FAQ164)    Total CHOL/HDL Ratio 2.7 <5.0 (calc)   Non-HDL Cholesterol (Calc) 103 <130 mg/dL (calc)    Comment: For patients with diabetes plus 1 major ASCVD risk  factor, treating to a non-HDL-C goal of <100 mg/dL  (LDL-C of <70 mg/dL) is considered a therapeutic  option.   POCT HgB A1C     Status: Abnormal   Collection Time: 04/08/18 10:30 AM  Result Value Ref Range   Hemoglobin A1C     HbA1c POC (<> result, manual entry)     HbA1c, POC (prediabetic range)     HbA1c, POC (controlled diabetic range) 6.0 0.0 - 7.0 %  HM DIABETES EYE EXAM     Status: None   Collection Time: 04/14/18 12:00 AM  Result Value Ref Range   HM Diabetic Eye Exam No Retinopathy No Retinopathy    Comment: [facility], Dr. Edison Pace      Assessment & Plan:       1. Welcome to Medicare preventive visit  - EKG 12-Lead - Visual acuity screening - Hearing screening; Future - [place] Screening AAA; Future  2. History of tobacco use  - [place] Screening AAA; Future  3. Overactive bladder  - Ambulatory referral to Urology  4. Failed hearing screening  - Ambulatory referral to ENT  5. Balance problems  -  Ambulatory referral to Neurology   Exercise Activities and Dietary recommendations Continue to eat healthy and exercise daily  Discussed health benefits of physical activity, and encouraged him to engage in regular exercise appropriate for his age and condition.   Immunization History  Administered Date(s) Administered  . Hepatitis A 02/25/2013, 11/12/2013  . Hepatitis A, Adult 03/24/2017  . Hepatitis B 02/25/2013, 11/12/2013  . Hepatitis B, adult 11/28/2014, 03/24/2017  . Influenza, High Dose Seasonal PF 04/08/2018  . Influenza,inj,Quad PF,6+ Mos 03/08/2015, 05/28/2016, 04/10/2017  . Influenza-Unspecified 02/25/2013, 03/01/2014  . Pneumococcal Conjugate-13 04/08/2018  . Pneumococcal  Polysaccharide-23 03/21/2015  . Tdap 12/02/2012  . Zoster 03/08/2015    Health Maintenance  Topic Date Due  . FOOT EXAM  08/13/2018  . HEMOGLOBIN A1C  10/08/2018  . URINE MICROALBUMIN  01/06/2019  . OPHTHALMOLOGY EXAM  04/15/2019  . PNA vac Low Risk Adult (2 of 2 - PPSV23) 03/20/2020  . COLONOSCOPY  03/25/2022  . TETANUS/TDAP  12/03/2022  . INFLUENZA VACCINE  Completed  . Hepatitis C Screening  Completed  . HIV Screening  Completed     No orders of the defined types were placed in this encounter.   Current Outpatient Medications:  .  acetaminophen (TYLENOL) 325 MG tablet, Take 650 mg by mouth every 6 (six) hours as needed for moderate pain., Disp: , Rfl:  .  aspirin EC 81 MG tablet, Take 1 tablet (81 mg total) by mouth daily., Disp: 30 tablet, Rfl: 0 .  atorvastatin (LIPITOR) 80 MG tablet, Take 1 tablet (80 mg total) by mouth daily. New dose, Disp: 90 tablet, Rfl: 0 .  DULoxetine (CYMBALTA) 60 MG capsule, Take 1 capsule (60 mg total) by mouth daily., Disp: 90 capsule, Rfl: 1 .  fluticasone (FLONASE) 50 MCG/ACT nasal spray, INSTILL 2 SPRAYS INTO EACH NOSTRIL AT BEDTIME IF NEEDED, Disp: 42 g, Rfl: 0 .  levETIRAcetam (KEPPRA) 250 MG tablet, Take 1 tablet (250 mg total) by mouth daily., Disp: 30 tablet, Rfl: 0 .  loratadine (CLARITIN) 10 MG tablet, Take 1 tablet (10 mg total) by mouth 2 (two) times daily., Disp: 180 tablet, Rfl: 1 .  traZODone (DESYREL) 100 MG tablet, Take 1 tablet by mouth at bedtime., Disp: , Rfl: 0 .  Vitamin D, Ergocalciferol, (DRISDOL) 50000 units CAPS capsule, Take 1 capsule (50,000 Units total) by mouth every 7 (seven) days., Disp: 12 capsule, Rfl: 0 .  montelukast (SINGULAIR) 10 MG tablet, Take 1 tablet (10 mg total) by mouth daily. (Patient not taking: Reported on 05/04/2018), Disp: 90 tablet, Rfl: 0 .  ranitidine (RANITIDINE ACID REDUCER) 75 MG tablet, Take 1 tablet (75 mg total) by mouth 2 (two) times daily. (Patient not taking: Reported on 05/04/2018), Disp:  60 tablet, Rfl: 2 There are no discontinued medications.  I have personally reviewed and addressed the Medicare Annual Wellness health risk assessment questionnaire and have noted the following in the patient's chart:  A.         Medical and social history & family history B.         Use of alcohol, tobacco or illicit drugs  C.         Current medications and supplements D.         Functional and Cognitive ability and status E.         Nutritional status F.         Physical activity G.        Advance directives H.  List of other physicians I.          Hospitalizations, surgeries, and ER visits in previous 12 months J.         [place] such as hearing and vision if needed, cognitive and depression L.         Referrals and appointments - Urologist to follow up, ENT for hearing loss, referral neurologist - Dr. Manuella Ghazi  In addition, I have reviewed and discussed with patient certain preventive protocols, quality metrics, and best practice recommendations. A written personalized care plan for preventive services as well as general preventive health recommendations were provided to patient.   See attached scanned questionnaire for additional information.

## 2018-05-06 ENCOUNTER — Telehealth: Payer: Self-pay | Admitting: Family Medicine

## 2018-05-06 NOTE — Telephone Encounter (Signed)
Can you please change for me? Thank you

## 2018-05-06 NOTE — Telephone Encounter (Signed)
Copied from [place] [phone]. Topic: Quick Communication - See Telephone Encounter >> May 06, 2018  2:07 PM Nils Flack wrote: CRM for notification. See Telephone encounter for: 05/06/18. Since pt has commercial bcbs for insurance, the aaa screening needs to be put in as IMG 3606 (aaa duplex limited) The aaa screening is only for pts who have medicare

## 2018-05-07 NOTE — Addendum Note (Signed)
Addended by: Saunders Glance A on: 05/07/2018 08:27 AM   Modules accepted: Orders

## 2018-05-07 NOTE — Telephone Encounter (Signed)
Order has been changed

## 2018-05-26 DIAGNOSIS — H903 Sensorineural hearing loss, bilateral: Secondary | ICD-10-CM | POA: Diagnosis not present

## 2018-05-26 DIAGNOSIS — R42 Dizziness and giddiness: Secondary | ICD-10-CM | POA: Diagnosis not present

## 2018-05-29 DIAGNOSIS — Z79899 Other long term (current) drug therapy: Secondary | ICD-10-CM | POA: Diagnosis not present

## 2018-05-29 DIAGNOSIS — G40219 Localization-related (focal) (partial) symptomatic epilepsy and epileptic syndromes with complex partial seizures, intractable, without status epilepticus: Secondary | ICD-10-CM | POA: Diagnosis not present

## 2018-05-29 DIAGNOSIS — R42 Dizziness and giddiness: Secondary | ICD-10-CM | POA: Diagnosis not present

## 2018-06-02 ENCOUNTER — Other Ambulatory Visit: Payer: Self-pay | Admitting: Neurology

## 2018-06-02 DIAGNOSIS — R42 Dizziness and giddiness: Secondary | ICD-10-CM

## 2018-06-05 DIAGNOSIS — R42 Dizziness and giddiness: Secondary | ICD-10-CM | POA: Diagnosis not present

## 2018-06-19 ENCOUNTER — Ambulatory Visit: Admission: RE | Admit: 2018-06-19 | Payer: Medicare Other | Source: Ambulatory Visit

## 2018-06-23 DIAGNOSIS — R42 Dizziness and giddiness: Secondary | ICD-10-CM | POA: Diagnosis not present

## 2018-07-02 ENCOUNTER — Ambulatory Visit
Admission: RE | Admit: 2018-07-02 | Discharge: 2018-07-02 | Disposition: A | Discharge status: Home / Self Care | Payer: Medicare Other | Source: Ambulatory Visit | Attending: Neurology | Admitting: Neurology

## 2018-07-02 DIAGNOSIS — R42 Dizziness and giddiness: Secondary | ICD-10-CM | POA: Insufficient documentation

## 2018-07-06 ENCOUNTER — Other Ambulatory Visit: Payer: Self-pay | Admitting: Neurology

## 2018-07-06 ENCOUNTER — Ambulatory Visit
Admission: RE | Admit: 2018-07-06 | Discharge: 2018-07-06 | Disposition: A | Discharge status: Home / Self Care | Payer: Medicare Other | Source: Ambulatory Visit | Attending: Neurology | Admitting: Neurology

## 2018-07-06 DIAGNOSIS — R42 Dizziness and giddiness: Secondary | ICD-10-CM | POA: Diagnosis not present

## 2018-07-06 DIAGNOSIS — G40219 Localization-related (focal) (partial) symptomatic epilepsy and epileptic syndromes with complex partial seizures, intractable, without status epilepticus: Secondary | ICD-10-CM | POA: Insufficient documentation

## 2018-07-06 DIAGNOSIS — Z1389 Encounter for screening for other disorder: Secondary | ICD-10-CM

## 2018-07-06 DIAGNOSIS — R270 Ataxia, unspecified: Secondary | ICD-10-CM | POA: Diagnosis not present

## 2018-07-06 DIAGNOSIS — Z135 Encounter for screening for eye and ear disorders: Secondary | ICD-10-CM | POA: Diagnosis not present

## 2018-07-06 LAB — POCT I-STAT CREATININE: Creatinine, Ser: 1 mg/dL (ref 0.61–1.24)

## 2018-07-06 MED ORDER — GADOBUTROL 1 MMOL/ML IV SOLN
7.0000 mL | Freq: Once | INTRAVENOUS | Status: AC | PRN
  Administered 2018-07-06: 7 mL via INTRAVENOUS

## 2018-07-07 DIAGNOSIS — R42 Dizziness and giddiness: Secondary | ICD-10-CM | POA: Diagnosis not present

## 2018-07-13 DIAGNOSIS — R202 Paresthesia of skin: Secondary | ICD-10-CM | POA: Diagnosis not present

## 2018-07-13 DIAGNOSIS — R42 Dizziness and giddiness: Secondary | ICD-10-CM | POA: Diagnosis not present

## 2018-07-13 DIAGNOSIS — R200 Anesthesia of skin: Secondary | ICD-10-CM | POA: Diagnosis not present

## 2018-07-29 ENCOUNTER — Ambulatory Visit (INDEPENDENT_AMBULATORY_CARE_PROVIDER_SITE_OTHER): Payer: Medicare Other | Admitting: Urology

## 2018-07-29 ENCOUNTER — Encounter: Payer: Self-pay | Admitting: Urology

## 2018-07-29 VITALS — BP 129/80 | HR 83 | Ht 64.0 in | Wt 159.0 lb

## 2018-07-29 DIAGNOSIS — R399 Unspecified symptoms and signs involving the genitourinary system: Secondary | ICD-10-CM | POA: Diagnosis not present

## 2018-07-29 LAB — BLADDER SCAN AMB NON-IMAGING: SCAN RESULT: 0

## 2018-07-29 NOTE — Patient Instructions (Signed)
T?ng s?n tuy?n ti?n li?t lnh tnh  Benign Prostatic Hyperplasia    T?ng s?n tuy?n ti?n li?t lnh tnh (BPH) l tuy?n ti?n li?t ph ??i do qu trnh lo ha bnh th??ng ch? khng ph?i do ung th? gy ra. Tuy?n ti?n li?t l tuy?n c kch th??c b?ng qu? h? ?o, tham gia vo qu trnh s?n xu?t tinh d?ch. N n?m pha tr??c tr?c trng v bn d??i bng quang. Bng quang l?u tr? n??c ti?u v ni?u ??o l ?ng ??a n??c ti?u ra kh?i c? th?. Tuy?n ti?n li?t c th? l?n h?n khi ?n ng nhi?u tu?i h?n.  Tuy?n ti?n li?t ph ??i c th? chn p vo ni?u ??o. ?i?u ny c th? lm cho kh ?i ti?u h?n. Tch t? n??c ti?u trong bng quang c th? gy nhi?m trng. p l?c ng??c dng v nhi?m trng c th? ti?n tri?n thnh t?n th??ng bng quang v suy th?n (th?n).  Nguyn nhn g gy ra?  Tnh tr?ng ny l m?t ph?n c?a qu trnh lo ha bnh th??ng. Tuy nhin, khng ph?i t?t c? nam gi?i ??u b? cc v?n ?? c?a tnh tr?ng ny. N?u tuy?n ti?n li?t ph ??i ra xa ni?u ??o, dng n??c ti?u s? khng b? ch?n. N?u n ph ??i h??ng v? ni?u ??o v chn p ni?u ??o, qu v? s? g?p v?n ?? v? ti?u ti?n.  ?i?u g lm t?ng nguy c??  Tnh tr?ng ny d? x?y ra h?n ? nam gi?i trn 50 tu?i.  Cc d?u hi?u ho?c tri?u ch?ng l g?  Nh?ng tri?u ch?ng c?a tnh tr?ng ny bao g?m:   Th?c d?y th??ng xuyn trong ?m ?? ?i ti?u.   C?n ?i ti?u th??ng xuyn trong ngy.   Kh b?t ??u dng n??c ti?u.   Gi?m kch th??c v ?? m?nh c?a dng n??c ti?u.   R? (nh? gi?t) sau khi ti?u ti?n.   Khng th? ?i ti?u. Tnh tr?ng ny c?n ph?i ?i?u tr? ngay l?p t?c.   Khng th? ?i h?t n??c ti?u trong bng quang.   ?au khi ?i ti?u. Tnh tr?ng ny ph? bi?n h?n n?u c?ng km theo nhi?m trng.   Nhi?m trng ???ng ti?u (UTI).  Ch?n ?on tnh tr?ng ny nh? th? no?  Tnh tr?ng ny ???c ch?n ?on d?a vo khm th?c th?, khai thc b?nh s? v cc tri?u ch?ng c?a qu v?. Cc ki?m tra c?ng s? ???c th?c hi?n, ch?ng h?n nh?:   Ch?p bng quang sau khi ?i ti?u. Bi?n php ny ?o b?t c? l??ng n??c ti?u no  cn l?i trong bng quang sau khi qu v? ?i ti?u xong.   Khm tr?c trng b?ng ngn tay. Trong l?n ki?m tra tr?c trng, chuyn gia ch?m sc s?c kh?e s? ki?m tra tuy?n ti?n li?t c?a qu v? b?ng cch cho m?t ngn tay ?eo g?ng, ? bi tr?n vo tr?c trng ?? s? m?t sau c?a tuy?n ti?n li?t. Ki?m tra ny pht hi?n kch th??c c?a tuy?n v b?t c? kh?i u ho?c s? pht tri?n b?t th??ng no.   Xt nghi?m n??c ti?u (phn tch n??c ti?u).   Sng l?c khng nguyn ??c hi?u tuy?n ti?n li?t (PSA). ?y l m?t xt nghi?m mu ???c s? d?ng ?? sng l?c ung th? tuy?n ti?n li?t.   Siu m. Ki?m tra ny s? d?ng sng m thanh ?? t?o hnh ?nh tuy?n ti?n li?t b?ng ph??ng php ?i?n t?.  Chuyn gia ch?m sc s?c kh?e c th? gi?i thi?u qu   v? ??n m?t chuyn gia v? b?nh th?n v b?nh tuy?n ti?n li?t (bc s? chuyn khoa ti?t ni?u).  Tnh tr?ng ny ???c ?i?u tr? nh? th? no?  Khi b?t ??u c tri?u ch?ng, chuyn gia ch?m sc s?c kh?e s? theo di tnh tr?ng c?a qu v? (theo di tch c?c ho?c ch? tch c?c). Vi?c ?i?u tr? ti?nh tra?ng ny ty thu?c vo m??c ?? n?ng c?a b?nh. ?i?u tr? c th? bao g?m:   Theo di v khm th??ng nin. ?y c th? l cch ?i?u tr? duy nh?t c?n thi?t n?u tnh tr?ng v cc tri?u ch?ng c?a qu v? nh?.   Thu?c ?? gia?m nhe? ca?c tri?u ch??ng, bao g?m:  ? Thu?c lm thu nh? tuy?n ti?n li?t.  ? Thu?c ?? th? gin cc c? c?a tuy?n ti?n li?t.   Ph?u thu?t trong tr??ng h?p n?ng. Ph?u thu?t c th? bao g?m:  ? C?t b? tuy?n ti?n li?t. Trong th? thu?t ny, m tuy?n ti?n li?t ???c ???c lo?i b? hon ton qua m?t v?t m? m? ho?c qua n?i soi ? b?ng ho?c robot.  ? C?t tuy?n ti?n li?t qua ni?u ??o (TURP). Trong th? thu?t ny, m?t d?ng c? ???c lu?n qua l? trn ??u d??ng v?t (ni?u ??o). N ???c s? d?ng ?? c?t b? m li bn trong c?a tuy?n ti?n li?t. Cc m?nh ny ???c lo?i b? thng qua cng m?t l? ? ??u d??ng v?t. Ph??ng php ny gip lo?i b? t?c ngh?n.  ? R?ch qua ni?u ??o (TUIP). Trong th? thu?t ny, cc v?t r?ch nh? ???c t?o ra trong tuy?n  ti?n li?t. Th? thu?t ny lm gi?m p l?c c?a tuy?n ti?n li?t ln ni?u ??o.  ? Li?u php nhi?t vi sng ni?u ??o (TUMT). Th? thu?t ny s? d?ng vi sng ?? t?o ra nhi?t. Nhi?t s? ph h?y v lo?i b? m?t l??ng nh? m tuy?n ti?n li?t.  ? B?c h?i tuy?n ti?n li?t b?ng kim qua ni?u ??o (TUNA). Th? thu?t ny s? d?ng t?n s? radio ?? ph h?y v lo?i b? m?t l??ng nh? m tuy?n ti?n li?t.  ? S? ?ng t? b?ng laser qua k? (ILC). Th? thu?t ny s? d?ng lade ?? ph h?y v lo?i b? m?t l??ng nh? m tuy?n ti?n li?t.  ? Lm b?c h?i tuy?n ti?n li?t qua ni?u ??o (TUVP). Th? thu?t ny s? d?ng cc ?i?n c?c ?? ph h?y v lo?i b? m?t l??ng nh? m tuy?n ti?n li?t.  ? Nng ni?u ??o tuy?n ti?n li?t. Th? thu?t ny ??a m?t m c?y vo ?? ??y cc thy c?a tuy?n ti?n li?t ra xa ni?u ??o.  Tun th? nh?ng h??ng d?n ny ? nh:   Ch? s? d?ng thu?c khng k ??n v thu?c k ??n theo ch? d?n c?a chuyn gia ch?m sc s?c kh?e.   Theo di cc tri?u ch?ng xem c b?t c? thay ??i no khng. Hy trao ??i v?i chuyn gia ch?m sc s?c kh?e n?u c b?t k? thay ??i no.   Trnh u?ng nhi?u n??c tr??c khi ?i ng? ho?c ra ngoi n?i cng c?ng.   Trnh u?ng ho?c gi?m l??ng caffeine ho?c r??u qu v? u?ng.   T? cho qu v? th?i gian khi ti?u ti?n.   Tun th? t?t c? cc l?n khm theo di theo ch? d?n c?a chuyn gia ch?m sc s?c kh?e. ?i?u ny c vai tr quan tr?ng.  Hy lin l?c v?i chuyn gia ch?m sc s?c kh?e n?u:   Qu v? b? ?au l?ng   khng r nguyn nhn.   Cc tri?u ch?ng c?a qu v? khng ?? h?n sau khi ???c ?i?u tr?.   Qu v? b? nh?ng tc d?ng ph? c?a thu?c m qu v? ?ang dng.   N??c ti?u c?a qu v? tr? nn r?t s?m mu ho?c c mi hi.   Vng b?ng d??i c?a qu v? ch??ng ln v qu v? ?i ti?u kh kh?n.  Yu c?u tr? gip ngay l?p t?c n?u:   Qu v? b? s?t ho?c ?n l?nh.   Qu v? ??t nhin khng th? ti?u ti?n.   Qu v? c?m th?y chong vng, ho?c r?t chng m?t, ho?c ng?t x?u.   C m?t l??ng l?n mu ho?c c?c mu ?ng trong n??c ti?u.   V?n ?? ti?u ti?n tr? ln kh qu?n  l.   Qu v? b? ?au th?t l?ng ho?c ?au m?ng s??n t? m?c ?? trung bnh ??n m?c ?? n?ng. M?ng s??n l phn bn c? th? gi?a x??ng s??n v hng.  Nh?ng tri?u ch?ng ny c th? l bi?u hi?n c?a m?t v?n ?? nghim tr?ng c?n c?p c?u. Khng ch? xem tri?u ch?ng c h?t khng. Hy ?i khm ngay l?p t?c. G?i cho d?ch v? c?p c?u t?i ??a ph??ng ([address] ? [place] Khng t? li xe ??n b?nh vi?n.  Tm t?t   T?ng s?n tuy?n ti?n li?t lnh tnh (BPH) l tuy?n ti?n li?t ph ??i do qu trnh lo ha bnh th??ng ch? khng ph?i do ung th? gy ra.   Tuy?n ti?n li?t ph ??i c th? chn p vo ni?u ??o. ?i?u ny c th? lm cho kh ?i ti?u.   Tnh tr?ng ny l m?t ph?n c?a qu trnh lo ha bnh th??ng v d? x?y ra h?n ? nam gi?i trn 50 tu?i.   Yu c?u tr? gip ngay l?p t?c n?u qu v? ??t nhin khng th? ti?u ti?n.  Thng tin ny khng nh?m m?c ?ch thay th? cho l?i khuyn m chuyn gia ch?m sc s?c kh?e ni v?i qu v?. Hy b?o ??m qu v? ph?i th?o lu?n b?t k? v?n ?? g m qu v? c v?i chuyn gia ch?m sc s?c kh?e c?a qu v?.  Document Released: 06/03/2005 Document Revised: 10/09/2016 Document Reviewed: 10/09/2016  Elsevier Interactive Patient Education  2019 [employer]

## 2018-07-29 NOTE — Progress Notes (Signed)
07/29/2018 10:35 AM   David Skinner 1951-03-02 [id number]  Referring provider: [name], MD [address] [place], [place] [address]  Chief Complaint  Patient presents with  . Benign Prostatic Hypertrophy    HPI: 68 year old male presents for annual follow-up of lower urinary tract symptoms.  He had bothersome lower urinary tract symptoms which did not improve with tamsulosin.  Cystoscopy showed no significant prostate enlargement.  He was given a trial of Myrbetriq and on follow-up reported that this medication significantly improved his voiding pattern.  He presents today with an interpreter who states he stopped this medication several months ago because of bedwetting.  He presently complains of sensation of incomplete emptying, frequency, intermittency, urgency, weak stream and straining to urinate.  He does not get up at night to void.  IPSS completed today was 24/4.  Denies dysuria, gross hematuria or flank/abdominal/pelvic/scrotal pain.   PMH: Past Medical History:  Diagnosis Date  . Allergy   . Anxiety   . Atopy   . Cataract of right eye    [facility]  . Chronic constipation   . Elevated LFTs   . Fatty liver   . GERD (gastroesophageal reflux disease)   . Hyperlipidemia   . Hypertension   . Hypoglycemia   . Insomnia   . Memory change   . Overweight   . Paresthesia of hand   . Seizure ([place])    12/12, 09/27/14, 02/28/16  . Tenosynovitis of finger   . Tinea cruris   . Vitamin D deficiency     Surgical History: Past Surgical History:  Procedure Laterality Date  . COLONOSCOPY WITH PROPOFOL N/A 03/25/2017   Procedure: COLONOSCOPY WITH PROPOFOL;  Surgeon: Lin Landsman, MD;  Location: [facility];  Service: Endoscopy;  Laterality: N/A;  . ESOPHAGOGASTRODUODENOSCOPY N/A 03/25/2017   Procedure: ESOPHAGOGASTRODUODENOSCOPY (EGD);  Surgeon: Lin Landsman, MD;  Location: [facility];  Service: Endoscopy;  Laterality:  N/A;  Diabetic - oral meds  . LASIK Bilateral     Home Medications:  Allergies as of 07/29/2018      Reactions   Ibuprofen Itching   Levofloxacin    Lisinopril Cough   Metoprolol    Nsaids Itching   Tolmetin Itching   Tramadol Hcl Itching      Medication List       Accurate as of July 29, 2018 10:35 AM. Always use your most recent med list.        acetaminophen 325 MG tablet Commonly known as:  TYLENOL Take 650 mg by mouth every 6 (six) hours as needed for moderate pain.   aspirin EC 81 MG tablet Take 1 tablet (81 mg total) by mouth daily.   atorvastatin 80 MG tablet Commonly known as:  LIPITOR Take 1 tablet (80 mg total) by mouth daily. New dose   DULoxetine 60 MG capsule Commonly known as:  CYMBALTA Take 1 capsule (60 mg total) by mouth daily.   fluticasone 50 MCG/ACT nasal spray Commonly known as:  FLONASE INSTILL 2 SPRAYS INTO EACH NOSTRIL AT BEDTIME IF NEEDED   LamoTRIgine 25 MG Tb24 24 hour tablet Commonly known as:  LAMICTAL XR Take by mouth.   LamoTRIgine 100 MG Tb24 24 hour tablet Take by mouth. Start taking on:  August 13, 2018   levETIRAcetam 250 MG tablet Commonly known as:  KEPPRA Take 1 tablet (250 mg total) by mouth daily.   loratadine 10 MG tablet Commonly known as:  CLARITIN Take 1  tablet (10 mg total) by mouth 2 (two) times daily.   montelukast 10 MG tablet Commonly known as:  SINGULAIR Take 1 tablet (10 mg total) by mouth daily.   traZODone 100 MG tablet Commonly known as:  DESYREL Take 1 tablet by mouth at bedtime.   Vitamin D (Ergocalciferol) 1.25 MG (50000 UT) Caps capsule Commonly known as:  DRISDOL Take 1 capsule (50,000 Units total) by mouth every 7 (seven) days.       Allergies:  Allergies  Allergen Reactions  . Ibuprofen Itching  . Levofloxacin   . Lisinopril Cough  . Metoprolol   . Nsaids Itching  . Tolmetin Itching  . Tramadol Hcl Itching    Family History: Family History  Family history unknown:  Yes    Social History:  reports that he quit smoking about 18 years ago. His smoking use included cigarettes. He started smoking about 34 years ago. He has a 15.00 pack-year smoking history. He has never used smokeless tobacco. He reports that he does not drink alcohol or use drugs.  ROS: UROLOGY Frequent Urination?: Yes Hard to postpone urination?: No Burning/pain with urination?: No Get up at night to urinate?: Yes Leakage of urine?: No Urine stream starts and stops?: No Trouble starting stream?: No Do you have to strain to urinate?: Yes Blood in urine?: No Urinary tract infection?: No Sexually transmitted disease?: No Injury to kidneys or bladder?: No Painful intercourse?: No Weak stream?: No Erection problems?: No Penile pain?: No  Gastrointestinal Nausea?: No Vomiting?: No Indigestion/heartburn?: No Diarrhea?: No Constipation?: No  Constitutional Fever: No Night sweats?: No Weight loss?: No Fatigue?: Yes  Skin Skin rash/lesions?: No Itching?: Yes  Eyes Blurred vision?: Yes Double vision?: No  Ears/Nose/Throat Sore throat?: No Sinus problems?: No  Hematologic/Lymphatic Swollen glands?: No Easy bruising?: No  Cardiovascular Leg swelling?: No Chest pain?: No  Respiratory Cough?: No Shortness of breath?: No  Endocrine Excessive thirst?: Yes  Musculoskeletal Back pain?: Yes Joint pain?: No  Neurological Headaches?: No Dizziness?: No  Psychologic Depression?: Yes Anxiety?: Yes  Physical Exam: BP 129/80   Pulse 83   Ht 5\' 4"  (1.626 m)   Wt 159 lb (72.1 kg)   BMI 27.29 kg/m   Constitutional:  Alert and oriented, No acute distress. HEENT: [place] AT, moist mucus membranes.  Trachea midline, no masses. Cardiovascular: No clubbing, cyanosis, or edema. Respiratory: Normal respiratory effort, no increased work of breathing. GI: Abdomen is soft, nontender, nondistended, no abdominal masses GU: No CVA tenderness.  Prostate 35 g, smooth without  nodules Lymph: No cervical or inguinal lymphadenopathy. Skin: No rashes, bruises or suspicious lesions. Neurologic: Grossly intact, no focal deficits, moving all 4 extremities. Psychiatric: Normal mood and affect.   Assessment & Plan:   68 year old male with bothersome lower urinary tract symptoms initially improved on Myrbetriq.  His most bothersome symptoms now are obstructive.  PVR by bladder scan was 0 mL.  A prior trial of tamsulosin was not effective.  Rx silodosin was sent to pharmacy.  Follow-up 4 to 6 weeks for symptom reassessment.   Abbie Sons, [facility] [address], [place], [place] [address] [phone]

## 2018-07-30 ENCOUNTER — Ambulatory Visit (INDEPENDENT_AMBULATORY_CARE_PROVIDER_SITE_OTHER): Payer: Medicare Other

## 2018-07-30 ENCOUNTER — Encounter: Payer: Self-pay | Admitting: Family Medicine

## 2018-07-30 ENCOUNTER — Ambulatory Visit (INDEPENDENT_AMBULATORY_CARE_PROVIDER_SITE_OTHER): Payer: Medicare Other | Admitting: Family Medicine

## 2018-07-30 ENCOUNTER — Encounter: Payer: Self-pay | Admitting: Urology

## 2018-07-30 VITALS — BP 110/80 | HR 100 | Resp 16 | Ht 65.0 in | Wt 160.6 lb

## 2018-07-30 DIAGNOSIS — F325 Major depressive disorder, single episode, in full remission: Secondary | ICD-10-CM

## 2018-07-30 DIAGNOSIS — E785 Hyperlipidemia, unspecified: Secondary | ICD-10-CM | POA: Diagnosis not present

## 2018-07-30 DIAGNOSIS — N521 Erectile dysfunction due to diseases classified elsewhere: Secondary | ICD-10-CM

## 2018-07-30 DIAGNOSIS — J3089 Other allergic rhinitis: Secondary | ICD-10-CM | POA: Diagnosis not present

## 2018-07-30 DIAGNOSIS — E1140 Type 2 diabetes mellitus with diabetic neuropathy, unspecified: Secondary | ICD-10-CM | POA: Diagnosis not present

## 2018-07-30 DIAGNOSIS — J302 Other seasonal allergic rhinitis: Secondary | ICD-10-CM

## 2018-07-30 DIAGNOSIS — N3281 Overactive bladder: Secondary | ICD-10-CM

## 2018-07-30 DIAGNOSIS — R399 Unspecified symptoms and signs involving the genitourinary system: Secondary | ICD-10-CM | POA: Diagnosis not present

## 2018-07-30 DIAGNOSIS — E559 Vitamin D deficiency, unspecified: Secondary | ICD-10-CM | POA: Diagnosis not present

## 2018-07-30 DIAGNOSIS — G40209 Localization-related (focal) (partial) symptomatic epilepsy and epileptic syndromes with complex partial seizures, not intractable, without status epilepticus: Secondary | ICD-10-CM

## 2018-07-30 MED ORDER — SILODOSIN 8 MG PO CAPS: 8.0000 mg | ORAL_CAPSULE | Freq: Every day | ORAL | 0 refills | Status: DC

## 2018-07-30 MED ORDER — VITAMIN D (ERGOCALCIFEROL) 1.25 MG (50000 UNIT) PO CAPS: 50000.0000 [IU] | ORAL_CAPSULE | ORAL | 0 refills | Status: DC

## 2018-07-30 MED ORDER — DULOXETINE HCL 60 MG PO CPEP: 60.0000 mg | ORAL_CAPSULE | Freq: Every day | ORAL | 1 refills | Status: DC

## 2018-07-30 MED ORDER — ATORVASTATIN CALCIUM 80 MG PO TABS: 80.0000 mg | ORAL_TABLET | Freq: Every day | ORAL | 1 refills | Status: DC

## 2018-07-30 MED ORDER — GLUCOSE BLOOD VI STRP: ORAL_STRIP | 12 refills | Status: DC

## 2018-07-30 MED ORDER — MONTELUKAST SODIUM 10 MG PO TABS: 10.0000 mg | ORAL_TABLET | Freq: Every day | ORAL | 1 refills | Status: DC

## 2018-07-30 MED ORDER — LORATADINE 10 MG PO TABS: 10.0000 mg | ORAL_TABLET | Freq: Two times a day (BID) | ORAL | 1 refills | Status: DC

## 2018-07-30 NOTE — Progress Notes (Signed)
Name: David Skinner   MRN: [id number]    DOB: 11-12-1950   Date:07/30/2018       Progress Note  Subjective  Chief Complaint  Chief Complaint  Patient presents with  . Diabetes  . Depression  . Hypertension  . Hyperlipidemia    HPI   Appointment was re-scheduled because patient will be out of town, and translator did not come in. His wife who translated for him.   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 He has been taking duloxetine, however wife states he stopped taking Keppra  but stopped about one month ago on his own, and got agitated , seen by Dr. Manuella Ghazi and is on Lamictal and off Keppra and is doing better.   Hyperlipidemia:he is taking Atorvastatin but only at 80 mg daily, reviewed labs with patient. No myalgia, no chest pain   OSA/minimal/RLS: he sees Dr Manuella Ghazi for memory loss and had sleep study2018it showed minimal sleep apnea and RLS -seeing Dr. Manuella Ghazi. He is still on Trazodone and is sleeping well.   HTN:bp is at goal, no chest pain or palpitation. BP still normal, unchanged   History ofAlcoholism: he used to drink heavy while in [place] used to drink liquor at least 3 times a week and used to get drunk, when he moved to [place] ( 1979). He was still drinking better but quit in2017. Unchanged   AR: hehas been taking loratadine and singulair again and very seldom has a cough now, no wheezing He smoked in the past but quit over 10 years ago, explained that he may still have COPD but since symptoms are mild and intermittent, since not an Research officer, political party today, we will hold off for now.   Urinary frequency: seen by Urologist yesterday and was supposed to get a new rx but not sent to pharmacy, advised wife to contact Urologist   Partial Seizure: Last episode  02/2016 and went to [facility],taking medication daily now and isseeing Dr. Manuella Ghazi. No recent episodes. He is building a gazebo at his house. Explained that he needs to avoid climbing stairs especially when alone at home. He was feeling dizzy and having more headaches, he stopped Keppra on his own, but seen by Dr. Manuella Ghazi and is now on Lamictal, tolerating medication well.  Diabetes Type 2 diet controlled with ED, hgbA1C hadgone up to 6.9% and we started Metformin back in 10/2015, down to 5.8% last visit, today hgbA1C is 6% and he has been off Metformin for the past 7 months. He has episodes of polyphagia but denies polydipsia or polyuria, occasionally he feels shaky and improves with a snack.He has not been compliant with his diet, and today hgbA1C was unable to be read, we will send him to the lab to confirm.    Patient Active Problem List   Diagnosis Date Noted  . OSA (obstructive sleep apnea) 12/09/2016  . RLS (restless legs syndrome) 12/09/2016  . Episode of moderate major depression ([place]) 12/09/2016  . History of alcoholism ([place]) 07/22/2016  . Cervical radiculitis 01/09/2016  . Lumbosacral radiculitis 01/09/2016  . Osteoarthritis of carpometacarpal (CMC) joint of thumb 01/09/2016  . Tinea cruris 03/21/2015  . 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 Depression with anxiety 11/28/2014  . Chronic constipation 11/28/2014  .  Insomnia 11/28/2014  . Dyslipidemia 11/28/2014  . Fatty infiltration of liver 11/28/2014  . Essential (primary) hypertension 11/28/2014  . Gastric reflux 11/28/2014  . Memory loss or impairment 11/28/2014  . Perennial allergic rhinitis with seasonal variation 11/28/2014  . Vitamin D deficiency 11/28/2014  . Tenosynovitis of thumb 11/28/2014  . Partial epilepsy with impairment of consciousness ([place]) 11/16/2014    Past Surgical History:  Procedure Laterality Date  . COLONOSCOPY WITH PROPOFOL N/A 03/25/2017   Procedure:  COLONOSCOPY WITH PROPOFOL;  Surgeon: Lin Landsman, MD;  Location: [facility];  Service: Endoscopy;  Laterality: N/A;  . ESOPHAGOGASTRODUODENOSCOPY N/A 03/25/2017   Procedure: ESOPHAGOGASTRODUODENOSCOPY (EGD);  Surgeon: Lin Landsman, MD;  Location: [facility];  Service: Endoscopy;  Laterality: N/A;  Diabetic - oral meds  . LASIK Bilateral     Family History  Family history unknown: Yes    Social History   Socioeconomic History  . Marital status: Married    Spouse name: [name]  . Number of children: 3  . Years of education: Not on file  . Highest education level: 12th grade  Occupational History  . Occupation: Retired   Scientific laboratory technician  . Financial resource strain: Not hard at all  . Food insecurity:    Worry: Never true    Inability: Never true  . Transportation needs:    Medical: No    Non-medical: No  Tobacco Use  . Smoking status: Former Smoker    Packs/day: 1.00    Years: 15.00    Pack years: 15.00    Types: Cigarettes    Start date: 06/17/1984    Last attempt to quit: 11/28/1999    Years since quitting: 18.6  . Smokeless tobacco: Never Used  Substance and Sexual Activity  . Alcohol use: No    Alcohol/week: 0.0 standard drinks    Comment: used to drink a pack on weekends.   . Drug use: No  . Sexual activity: Yes    Partners: Female  Lifestyle  . Physical activity:    Days per week: 5 days    Minutes per session: 60 min  . Stress: Not at all  Relationships  . Social connections:    Talks on phone: More than three times a week    Gets together: More than three times a week    Attends religious service: 1 to 4 times per year    Active member of club or organization: No    Attends meetings of clubs or organizations: Never    Relationship status: Living with partner  . Intimate partner violence:    Fear of current or ex partner: No    Emotionally abused: No    Physically abused: No    Forced sexual activity: No  Other Topics  Concern  . Not on file  Social History Narrative   Originally from [place]      Current Outpatient Medications:  .  acetaminophen (TYLENOL) 325 MG tablet, Take 650 mg by mouth every 6 (six) hours as needed for moderate pain., Disp: , Rfl:  .  aspirin EC 81 MG tablet, Take 1 tablet (81 mg total) by mouth daily., Disp: 30 tablet, Rfl: 0 .  atorvastatin (LIPITOR) 80 MG tablet, Take 1 tablet (80 mg total) by mouth daily. New dose, Disp: 90 tablet, Rfl: 0 .  DULoxetine (CYMBALTA) 60 MG capsule, Take 1 capsule (60 mg total) by mouth daily., Disp: 90 capsule, Rfl: 1 .  fluticasone (FLONASE) 50 MCG/ACT nasal spray,  INSTILL 2 SPRAYS INTO EACH NOSTRIL AT BEDTIME IF NEEDED, Disp: 42 g, Rfl: 0 .  LamoTRIgine (LAMICTAL XR) 25 MG TB24 24 hour tablet, Take by mouth., Disp: , Rfl:  .  [START ON 08/13/2018] LamoTRIgine 100 MG TB24 24 hour tablet, Take by mouth., Disp: , Rfl:  .  levETIRAcetam (KEPPRA) 250 MG tablet, Take 1 tablet (250 mg total) by mouth daily., Disp: 30 tablet, Rfl: 0 .  loratadine (CLARITIN) 10 MG tablet, Take 1 tablet (10 mg total) by mouth 2 (two) times daily., Disp: 180 tablet, Rfl: 1 .  montelukast (SINGULAIR) 10 MG tablet, Take 1 tablet (10 mg total) by mouth daily. (Patient not taking: Reported on 05/04/2018), Disp: 90 tablet, Rfl: 0 .  traZODone (DESYREL) 100 MG tablet, Take 1 tablet by mouth at bedtime., Disp: , Rfl: 0 .  Vitamin D, Ergocalciferol, (DRISDOL) 50000 units CAPS capsule, Take 1 capsule (50,000 Units total) by mouth every 7 (seven) days., Disp: 12 capsule, Rfl: 0  Allergies  Allergen Reactions  . Ibuprofen Itching  . Levofloxacin   . Lisinopril Cough  . Metoprolol   . Nsaids Itching  . Tolmetin Itching  . Tramadol Hcl Itching    I personally reviewed active problem list, medication list, allergies, family history with the patient/caregiver today.   ROS  Constitutional: Negative for fever, positive for mild  weight change.  Respiratory: Negative for cough and  shortness of breath.   Cardiovascular: Negative for chest pain or palpitations.  Gastrointestinal: Negative for abdominal pain, no bowel changes.  Musculoskeletal: Negative for gait problem or joint swelling.  Skin: Negative for rash.  Neurological: positive for intermittent dizziness or headache.  No other specific complaints in a complete review of systems (except as listed in HPI above).  Objective  Vitals:   07/30/18 0804  BP: 110/80  Pulse: 100  Resp: 16  SpO2: 97%  Weight: 160 lb 9.6 oz (72.8 kg)  Height: 5\' 5"  (1.651 m)    Body mass index is 26.73 kg/m.  Physical Exam  Constitutional: Patient appears well-developed and well-nourished. Overweight. No distress.  HEENT: head atraumatic, normocephalic, pupils equal and reactive to light, neck supple, throat within normal limits Cardiovascular: Normal rate, regular rhythm and normal heart sounds.  No murmur heard. No BLE edema. Pulmonary/Chest: Effort normal and breath sounds normal. No respiratory distress. Abdominal: Soft.  There is no tenderness. Psychiatric: Patient has a normal mood and affect. behavior is normal. Judgment and thought content normal.  Recent Results (from the past 2160 hour(s))  I-STAT creatinine     Status: None   Collection Time: 07/06/18 11:26 AM  Result Value Ref Range   Creatinine, Ser 1.00 0.61 - 1.24 mg/dL  Bladder Scan (Post Void Residual) in office     Status: None   Collection Time: 07/29/18 10:31 AM  Result Value Ref Range   Scan Result 0       PHQ2/9: Depression screen [facility] 2/9 07/30/2018 05/04/2018 04/08/2018 01/05/2018 11/17/2017  Decreased Interest 0 0 0 0 1  Down, Depressed, Hopeless 0 0 0 0 0  PHQ - 2 Score 0 0 0 0 1  Altered sleeping 3 0 0 0 0  Tired, decreased energy 3 0 0 0 1  Change in appetite 0 0 0 2 1  Feeling bad or failure about yourself  0 0 0 0 0  Trouble concentrating 0 0 0 0 0  Moving slowly or fidgety/restless 0 0 0 0 0  Suicidal thoughts 0 0 0 0  0  PHQ-9 Score  6 0 0 2 3  Difficult doing work/chores Not difficult at all Not difficult at all Not difficult at all Somewhat difficult Not difficult at all  Some recent data might be hidden     Fall Risk: Fall Risk  07/30/2018 05/04/2018 04/08/2018 01/05/2018 10/06/2017  Falls in the past year? 0 0 No No No  Number falls in past yr: 0 0 - - -  Injury with Fall? 0 0 - - -    Assessment & Plan  1. 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 ratio - Hemoglobin A1c  2. Dyslipidemia  - atorvastatin (LIPITOR) 80 MG tablet; Take 1 tablet (80 mg total) by mouth daily. New dose  Dispense: 90 tablet; Refill: 1  3. Major depression in remission (HCC)  - DULoxetine (CYMBALTA) 60 MG capsule; Take 1 capsule (60 mg total) by mouth daily.  Dispense: 90 capsule; Refill: 1  4. Vitamin D deficiency  - Vitamin D, Ergocalciferol, (DRISDOL) 1.25 MG (50000 UT) CAPS capsule; Take 1 capsule (50,000 Units total) by mouth every 7 (seven) days.  Dispense: 12 capsule; Refill: 0  5. Perennial allergic rhinitis with seasonal variation  - montelukast (SINGULAIR) 10 MG tablet; Take 1 tablet (10 mg total) by mouth daily.  Dispense: 90 tablet; Refill: 1 - loratadine (CLARITIN) 10 MG tablet; Take 1 tablet (10 mg total) by mouth 2 (two) times daily.  Dispense: 180 tablet; Refill: 1   6. Partial epilepsy with impairment of consciousness ([place])  Keep follow up with Dr. Manuella Ghazi   7. Overactive bladder   8. Lower urinary tract symptoms (LUTS)  Contact Dr. Bernardo Heater about refill

## 2018-07-30 NOTE — Patient Instructions (Addendum)
  Thank you allowing the Chronic Care Management Team to be a part of your care! It was a pleasure speaking with you today!  1. Please check your blood sugars daily and record (first thing in the morning before you eat) 2. You can use the card give to you today to reduce your cost for strips 3. Please call CCM Team if you have any fasting glucose readings >300  CCM (Chronic Care Management) Team   Trish Fountain RN, BSN Nurse Care Coordinator  [phone]  Ruben Reason PharmD  Clinical Pharmacist  [phone]   Goals Addressed            This Visit's Progress   . "They said I need to be checking my sugars every day" (pt-stated)       Current Barriers:  . Lack of glucometer . Knowledge deficit related to understanding how to use glucometer   Nurse Case Manager Clinical Goal(s): Over the next 14 days, patient will check his fasting blood sugar daily and record  Interventions:   Provided patient with Contour glucometer and card for reduced cost strips  Provided education and demonstration on glucometer use  Patient Self Care Activities:  . Will independently check blood sugars . Will record blood sugars  . Will report fasting blood sugars >300 to provider or CCM RN CM  *initial goal documentation        The patient verbalized understanding of instructions provided today and declined a print copy of patient instruction materials.   Telephone follow up appointment with CCM team member scheduled for: 2 weeks

## 2018-07-30 NOTE — Chronic Care Management (AMB) (Signed)
  Care Management   Initial Visit Note  07/30/2018 Name: David Skinner MRN: [id number] DOB: 01-12-1951   Subjective: "They said I need to check my sugars every day"  Objective:  Lab Results  Component Value Date   HGBA1C 6.0 04/08/2018   Today's reading would not read. Questionable machine error.  Assessment: David Skinner is a 68 year old male patient of Dr. Ancil Boozer who requested the CCM Team meet with David Skinner and his wife to provide glucometer education. David Skinner was in the office today for a routine visit with his PCP. POC A1C would not read. Dr. Ancil Boozer requested patient check fasting blood sugar daily. David Skinner has a history of but not limited to HTN, OSA, Type 2 DM, and memory impairment.  Review of patient status, including review of consultants reports, relevant laboratory and other test results, and collaboration with appropriate care team members and the patient's provider was performed as part of comprehensive patient evaluation and provision of chronic care management services.    0 ED visits/0 hospitalizations in the last year  <no information>  Goals Addressed    . "They said I need to be checking my sugars every day" (pt-stated)       Current Barriers:  . Lack of glucometer . Knowledge deficit related to understanding how to use glucometer   Nurse Case Manager Clinical Goal(s): Over the next 14 days, patient will check his fasting blood sugar daily and record  Interventions:   Provided patient with Contour glucometer and card for reduced cost strips  Provided education and demonstration on glucometer use  Patient Self Care Activities:  . Will independently check blood sugars . Will record blood sugars  . Will report fasting blood sugars >300 to provider or CCM RN CM  *initial goal documentation     Follow up plan:  Telephone follow up appointment with CCM team member scheduled for: 2 weeks  David Skinner was given information about Care Management services  today including:  1. Case Management services include personalized support from designated clinical staff supervised by a physician, including individualized plan of care and coordination with other care providers 2. 24/7 contact phone numbers for assistance for urgent and routine care needs. 3. The patient may stop CCM services at any time (effective at the end of the month) by phone call to the office staff.  Patient agreed to services and verbal consent obtained.    Kayly Kriegel E. Rollene Rotunda, RN, BSN Nurse Care Coordinator [facility] Care Management  [phone]

## 2018-07-31 LAB — HEMOGLOBIN A1C
Hgb A1c MFr Bld: 6.3 % of total Hgb — ABNORMAL HIGH (ref <5.7)
MEAN PLASMA GLUCOSE: 134 (calc)
eAG (mmol/L): 7.4 (calc)

## 2018-07-31 LAB — MICROALBUMIN / CREATININE URINE RATIO
Creatinine, Urine: 255 mg/dL (ref 20–320)
MICROALB/CREAT RATIO: 6 ug/mg{creat} (ref <30)
Microalb, Ur: 1.5 mg/dL

## 2018-08-03 ENCOUNTER — Other Ambulatory Visit: Payer: Self-pay | Admitting: Urology

## 2018-08-06 ENCOUNTER — Telehealth: Payer: Self-pay

## 2018-08-06 MED ORDER — ACCU-CHEK SOFT TOUCH LANCETS MISC: 1.0000 | Freq: Two times a day (BID) | 3 refills | Status: DC | PRN

## 2018-08-06 MED ORDER — GLUCOSE BLOOD VI STRP: 1.0000 | ORAL_STRIP | Freq: Two times a day (BID) | 3 refills | Status: DC | PRN

## 2018-08-06 MED ORDER — ACCU-CHEK AVIVA PLUS W/DEVICE KIT: 1.0000 | PACK | Freq: Every day | 0 refills | Status: DC

## 2018-08-06 NOTE — Telephone Encounter (Signed)
Insurance does not prefer Contour-Prefers meters are: Accu-Chek or True Metrix.

## 2018-08-10 ENCOUNTER — Ambulatory Visit: Payer: BLUE CROSS/BLUE SHIELD | Admitting: Family Medicine

## 2018-08-18 ENCOUNTER — Ambulatory Visit: Payer: Medicare Other

## 2018-08-18 DIAGNOSIS — I10 Essential (primary) hypertension: Secondary | ICD-10-CM

## 2018-08-18 DIAGNOSIS — E1140 Type 2 diabetes mellitus with diabetic neuropathy, unspecified: Secondary | ICD-10-CM

## 2018-08-18 DIAGNOSIS — N521 Erectile dysfunction due to diseases classified elsewhere: Secondary | ICD-10-CM

## 2018-08-18 NOTE — Chronic Care Management (AMB) (Signed)
  Chronic Care Management   Note  08/18/2018 Name: Guage Efferson MRN: [id number] DOB: [birth year]   Mr. Bisono is a 68 year old male patient of Dr. Ancil Boozer who requested the CCM Team meet with Mr. Blossom and his wife to provide glucometer education. Mr. Goldston was in the office today for a routine visit with his PCP. POC A1C would not read. Dr. Ancil Boozer requested patient check fasting blood sugar daily. Mr. Hinz has a history of but not limited to HTN, OSA, Type 2 DM, and memory impairment.  Today, CCM RN CM attempted to follow up with Mr. Klipfel unfortunately according to his wife he is not available. Wife states "he is good now" "does not check sugar". Left message with wife requesting call back from patient. Number provided.   Kember Boch E. Rollene Rotunda, RN, BSN Nurse Care Coordinator [facility] Care Management  [phone]

## 2018-08-24 ENCOUNTER — Ambulatory Visit: Payer: Medicare Other | Admitting: Urology

## 2018-09-07 ENCOUNTER — Ambulatory Visit: Payer: Medicare Other | Admitting: Urology

## 2018-09-21 ENCOUNTER — Telehealth: Payer: Self-pay | Admitting: Urology

## 2018-09-21 DIAGNOSIS — R399 Unspecified symptoms and signs involving the genitourinary system: Secondary | ICD-10-CM

## 2018-09-21 MED ORDER — SILODOSIN 8 MG PO CAPS: 8.0000 mg | ORAL_CAPSULE | Freq: Every day | ORAL | 11 refills | Status: DC

## 2018-09-21 NOTE — Addendum Note (Signed)
Addended by: Feliberto Gottron on: 09/21/2018 01:52 PM   Modules accepted: Orders

## 2018-09-21 NOTE — Telephone Encounter (Signed)
Silodosin refill sent to pharmacy

## 2018-09-21 NOTE — Telephone Encounter (Signed)
Refill for Sildenafil 8 mg. Please advise.

## 2018-09-22 ENCOUNTER — Other Ambulatory Visit: Payer: Self-pay | Admitting: Family Medicine

## 2018-09-22 DIAGNOSIS — F325 Major depressive disorder, single episode, in full remission: Secondary | ICD-10-CM

## 2018-09-22 DIAGNOSIS — J302 Other seasonal allergic rhinitis: Secondary | ICD-10-CM

## 2018-09-22 DIAGNOSIS — J3089 Other allergic rhinitis: Secondary | ICD-10-CM

## 2018-09-22 DIAGNOSIS — E559 Vitamin D deficiency, unspecified: Secondary | ICD-10-CM

## 2018-09-22 DIAGNOSIS — E785 Hyperlipidemia, unspecified: Secondary | ICD-10-CM

## 2018-09-22 MED ORDER — MONTELUKAST SODIUM 10 MG PO TABS: 10.0000 mg | ORAL_TABLET | Freq: Every day | ORAL | 1 refills | Status: DC

## 2018-09-22 MED ORDER — VITAMIN D (ERGOCALCIFEROL) 1.25 MG (50000 UNIT) PO CAPS: 50000.0000 [IU] | ORAL_CAPSULE | ORAL | 0 refills | Status: DC

## 2018-09-22 MED ORDER — TRAZODONE HCL 100 MG PO TABS: 100.0000 mg | ORAL_TABLET | Freq: Every day | ORAL | 0 refills | Status: DC

## 2018-09-22 MED ORDER — DULOXETINE HCL 60 MG PO CPEP: 60.0000 mg | ORAL_CAPSULE | Freq: Every day | ORAL | 1 refills | Status: DC

## 2018-09-22 MED ORDER — ACCU-CHEK AVIVA PLUS W/DEVICE KIT: 1.0000 | PACK | Freq: Every day | 0 refills | Status: DC

## 2018-09-22 MED ORDER — ATORVASTATIN CALCIUM 80 MG PO TABS: 80.0000 mg | ORAL_TABLET | Freq: Every day | ORAL | 1 refills | Status: DC

## 2018-09-22 MED ORDER — FLUTICASONE PROPIONATE 50 MCG/ACT NA SUSP: 2.0000 | Freq: Every day | NASAL | 0 refills | Status: DC

## 2018-09-22 MED ORDER — LORATADINE 10 MG PO TABS: 10.0000 mg | ORAL_TABLET | Freq: Two times a day (BID) | ORAL | 1 refills | Status: DC

## 2018-09-22 NOTE — Telephone Encounter (Signed)
Copied from [id number]. Topic: Quick Communication - Rx Refill/Question >> Sep 22, 2018 12:33 PM Burchel, Abbi R wrote: Medication: acetaminophen (TYLENOL) 325 MG tablet, aspirin EC 81 MG tablet, atorvastatin (LIPITOR) 80 MG tablet, Blood Glucose Monitoring Suppl (ACCU-CHEK AVIVA PLUS) w/Device KIT,  DULoxetine (CYMBALTA) 60 MG capsule, fluticasone (FLONASE) 50 MCG/ACT nasal spray, Lancets (ACCU-CHEK SOFT TOUCH) lancets , loratadine (CLARITIN) 10 MG tablet, montelukast (SINGULAIR) 10 MG tablet, traZODone (DESYREL) 100 MG tablet, Vitamin D, Ergocalciferol, (DRISDOL) 1.25 MG (50000 UT) CAPS capsule     Pt requesting that new rx for these medications be sent to mail order pharmacy.   Preferred Pharmacy: [facility] [place], [place] [facility] [phone] (Phone) [phone] (Fax)   Pt was advised that RX refills may take up to 3 business days. We ask that you follow-up with your pharmacy.

## 2018-10-06 ENCOUNTER — Encounter: Payer: Self-pay | Admitting: Urology

## 2018-10-06 ENCOUNTER — Other Ambulatory Visit: Payer: Self-pay

## 2018-10-06 ENCOUNTER — Ambulatory Visit (INDEPENDENT_AMBULATORY_CARE_PROVIDER_SITE_OTHER): Payer: Medicare Other | Admitting: Urology

## 2018-10-06 VITALS — BP 120/73 | HR 94 | Ht 65.0 in | Wt 160.2 lb

## 2018-10-06 DIAGNOSIS — N401 Enlarged prostate with lower urinary tract symptoms: Secondary | ICD-10-CM

## 2018-10-06 DIAGNOSIS — R399 Unspecified symptoms and signs involving the genitourinary system: Secondary | ICD-10-CM | POA: Diagnosis not present

## 2018-10-06 DIAGNOSIS — N138 Other obstructive and reflux uropathy: Secondary | ICD-10-CM | POA: Diagnosis not present

## 2018-10-06 LAB — BLADDER SCAN AMB NON-IMAGING

## 2018-10-06 NOTE — Patient Instructions (Signed)
T?ng s?n tuy?n ti?n li?t lnh tnh Benign Prostatic Hyperplasia  T?ng s?n tuy?n ti?n li?t lnh tnh (BPH) l tuy?n ti?n li?t ph ??i do qu trnh lo ha bnh th??ng ch? khng ph?i do ung th? gy ra. Tuy?n ti?n li?t l tuy?n c kch th??c b?ng qu? h? ?o, tham gia vo qu trnh s?n xu?t tinh d?ch. N n?m pha tr??c tr?c trng v bn d??i bng quang. Bng quang l?u tr? n??c ti?u v ni?u ??o l ?ng ??a n??c ti?u ra kh?i c? th?. Tuy?n ti?n li?t c th? l?n h?n khi ?n ng nhi?u tu?i h?n. Tuy?n ti?n li?t ph ??i c th? chn p vo ni?u ??o. ?i?u ny c th? lm cho kh ?i ti?u h?n. Tch t? n??c ti?u trong bng quang c th? gy nhi?m trng. p l?c ng??c dng v nhi?m trng c th? ti?n tri?n thnh t?n th??ng bng quang v suy th?n (th?n). Nguyn nhn g gy ra? Tnh tr?ng ny l m?t ph?n c?a qu trnh lo ha bnh th??ng. Tuy nhin, khng ph?i t?t c? nam gi?i ??u b? cc v?n ?? c?a tnh tr?ng ny. N?u tuy?n ti?n li?t ph ??i ra xa ni?u ??o, dng n??c ti?u s? khng b? ch?n. N?u n ph ??i h??ng v? ni?u ??o v chn p ni?u ??o, qu v? s? g?p v?n ?? v? ti?u ti?n. ?i?u g lm t?ng nguy c?? Tnh tr?ng ny d? x?y ra h?n ? nam gi?i trn 50 tu?i. Cc d?u hi?u ho?c tri?u ch?ng l g? Nh?ng tri?u ch?ng c?a tnh tr?ng ny bao g?m:  Th?c d?y th??ng xuyn trong ?m ?? ?i ti?u.  C?n ?i ti?u th??ng xuyn trong ngy.  Kh b?t ??u dng n??c ti?u.  Gi?m kch th??c v ?? m?nh c?a dng n??c ti?u.  R? (nh? gi?t) sau khi ti?u ti?n.  Khng th? ?i ti?u. Tnh tr?ng ny c?n ph?i ?i?u tr? ngay l?p t?c.  Khng th? ?i h?t n??c ti?u trong bng quang.  ?au khi ?i ti?u. Tnh tr?ng ny ph? bi?n h?n n?u c?ng km theo nhi?m trng.  Nhi?m trng ???ng ti?u (UTI). Ch?n ?on tnh tr?ng ny nh? th? no? Tnh tr?ng ny ???c ch?n ?on d?a vo khm th?c th?, khai thc b?nh s? v cc tri?u ch?ng c?a qu v?. Cc ki?m tra c?ng s? ???c th?c hi?n, ch?ng h?n nh?:  Ch?p bng quang sau khi ?i ti?u. Bi?n php ny ?o b?t c? l??ng n??c ti?u no  cn l?i trong bng quang sau khi qu v? ?i ti?u xong.  Khm tr?c trng b?ng ngn tay. Trong l?n ki?m tra tr?c trng, chuyn gia ch?m [place] s?c kh?e s? ki?m tra tuy?n ti?n li?t c?a qu v? b?ng cch cho m?t ngn tay ?eo g?ng, ? bi tr?n vo tr?c trng ?? s? m?t sau c?a tuy?n ti?n li?t. Ki?m tra ny pht hi?n kch th??c c?a tuy?n v b?t c? kh?i u ho?c s? pht tri?n b?t th??ng no.  Xt nghi?m n??c ti?u (phn tch n??c ti?u).  Sng l?c khng nguyn ??c hi?u tuy?n ti?n li?t (PSA). ?y l m?t xt nghi?m mu ???c s? d?ng ?? sng l?c ung th? tuy?n ti?n li?t.  Siu m. Ki?m tra ny s? d?ng sng m thanh ?? t?o hnh ?nh tuy?n ti?n li?t b?ng ph??ng php ?i?n t?. Chuyn gia ch?m  s?c kh?e c th? gi?i thi?u qu v? ??n m?t chuyn gia v? b?nh th?n v b?nh tuy?n ti?n li?t (bc s? chuyn khoa ti?t ni?u). Tnh tr?ng ny ???c ?i?u tr? nh? th?  no? Khi b?t ??u c tri?u ch?ng, chuyn gia ch?m [place] s?c kh?e s? theo di tnh tr?ng c?a qu v? (theo di tch c?c ho?c ch? tch c?c). Vi?c ?i?u tr? ti?nh tra?ng ny ty thu?c vo m??c ?? n?ng c?a b?nh. ?i?u tr? c th? bao g?m:  Theo di v khm th??ng nin. ?y c th? l cch ?i?u tr? duy nh?t c?n thi?t n?u tnh tr?ng v cc tri?u ch?ng c?a qu v? nh?.  Thu?c ?? gia?m nhe? ca?c tri?u ch??ng, bao g?m: ? Thu?c lm thu nh? tuy?n ti?n li?t. ? Thu?c ?? th? gin cc c? c?a tuy?n ti?n li?t.  Ph?u thu?t trong tr??ng h?p n?ng. Ph?u thu?t c th? bao g?m: ? C?t b? tuy?n ti?n li?t. Trong th? thu?t ny, m tuy?n ti?n li?t ???c ???c lo?i b? hon ton qua m?t v?t m? m? ho?c qua n?i soi ? b?ng ho?c robot. ? C?t tuy?n ti?n li?t qua ni?u ??o (TURP). Trong th? thu?t ny, m?t d?ng c? ???c lu?n qua l? trn ??u d??ng v?t (ni?u ??o). N ???c s? d?ng ?? c?t b? m li bn trong c?a tuy?n ti?n li?t. Cc m?nh ny ???c lo?i b? thng qua cng m?t l? ? ??u d??ng v?t. Ph??ng php ny gip lo?i b? t?c ngh?n. ? R?ch qua ni?u ??o (TUIP). Trong th? thu?t ny, cc v?t r?ch nh? ???c t?o ra trong tuy?n  ti?n li?t. Th? thu?t ny lm gi?m p l?c c?a tuy?n ti?n li?t ln ni?u ??o. ? Li?u php nhi?t vi sng ni?u ??o (TUMT). Th? thu?t ny s? d?ng vi sng ?? t?o ra nhi?t. Nhi?t s? ph h?y v lo?i b? m?t l??ng nh? m tuy?n ti?n li?t. ? B?c h?i tuy?n ti?n li?t b?ng kim qua ni?u ??o (TUNA). Th? thu?t ny s? d?ng t?n s? radio ?? ph h?y v lo?i b? m?t l??ng nh? m tuy?n ti?n li?t. ? S? ?ng t? b?ng laser qua k? ([place]). Th? thu?t ny s? d?ng lade ?? ph h?y v lo?i b? m?t l??ng nh? m tuy?n ti?n li?t. ? Lm b?c h?i tuy?n ti?n li?t qua ni?u ??o (TUVP). Th? thu?t ny s? d?ng cc ?i?n c?c ?? ph h?y v lo?i b? m?t l??ng nh? m tuy?n ti?n li?t. ? Nng ni?u ??o tuy?n ti?n li?t. Th? thu?t ny ??a m?t m c?y vo ?? ??y cc thy c?a tuy?n ti?n li?t ra xa ni?u ??o. Tun th? nh?ng h??ng d?n ny ? nh:  Ch? s? d?ng thu?c khng k ??n v thu?c k ??n theo ch? d?n c?a chuyn gia ch?m [place] s?c kh?e.  Theo di cc tri?u ch?ng xem c b?t c? thay ??i no khng. Hy trao ??i v?i chuyn gia ch?m [place] s?c kh?e n?u c b?t k? thay ??i no.  Trnh u?ng nhi?u n??c tr??c khi ?i ng? ho?c ra ngoi n?i cng c?ng.  Trnh u?ng ho?c gi?m l??ng caffeine ho?c r??u qu v? u?ng.  T? cho qu v? th?i gian khi ti?u ti?n.  Tun th? t?t c? cc l?n khm theo di theo ch? d?n c?a chuyn gia ch?m [place] s?c kh?e. ?i?u ny c vai tr quan tr?ng. Hy lin l?c v?i chuyn gia ch?m [place] s?c kh?e n?u:  Qu v? b? ?au l?ng khng r nguyn nhn.  Cc tri?u ch?ng c?a qu v? khng ?? h?n sau khi ???c ?i?u tr?Sander Nephew v? b? nh?ng tc d?ng ph? c?a thu?c m qu v? ?ang dng.  N??c ti?u c?a qu v? tr? nn r?t s?m mu ho?c c mi hi.  Vng  b?ng d??i c?a qu v? ch??ng ln v qu v? ?i ti?u kh kh?n. Yu c?u tr? gip ngay l?p t?c n?u:  Qu v? b? s?t ho?c ?n l?nh.  Qu v? ??t nhin khng th? ti?u ti?n.  Qu v? c?m th?y chong vng, ho?c r?t chng m?t, ho?c ng?t x?u.  C m?t l??ng l?n mu ho?c c?c mu ?ng trong n??c ti?u.  V?n ?? ti?u ti?n tr? ln kh qu?n  l.  Qu v? b? ?au th?t l?ng ho?c ?au m?ng s??n t? m?c ?? trung bnh ??n m?c ?? n?ng. M?ng s??n l phn bn c? th? gi?a x??ng s??n v hng. Nh?ng tri?u ch?ng ny c th? l bi?u hi?n c?a m?t v?n ?? nghim tr?ng c?n c?p c?u. Khng ch? xem tri?u ch?ng c h?t khng. Hy ?i khm ngay l?p t?c. G?i cho d?ch v? c?p c?u t?i ??a ph??ng ([address] ? [place] Khng t? li xe ??n b?nh vi?n. Tm t?t  T?ng s?n tuy?n ti?n li?t lnh tnh (BPH) l tuy?n ti?n li?t ph ??i do qu trnh lo ha bnh th??ng ch? khng ph?i do ung th? gy ra.  Tuy?n ti?n li?t ph ??i c th? chn p vo ni?u ??o. ?i?u ny c th? lm cho kh ?i ti?u.  Tnh tr?ng ny l m?t ph?n c?a qu trnh lo ha bnh th??ng v d? x?y ra h?n ? nam gi?i trn 50 tu?i.  Yu c?u tr? gip ngay l?p t?c n?u qu v? ??t nhin khng th? ti?u ti?n. Thng tin ny khng nh?m m?c ?ch thay th? cho l?i khuyn m chuyn gia ch?m [place] s?c kh?e ni v?i qu v?. Hy b?o ??m qu v? ph?i th?o lu?n b?t k? v?n ?? g m qu v? c v?i chuyn gia ch?m [place] s?c kh?e c?a qu v?. Document Released: 06/03/2005 Document Revised: 10/09/2016 Document Reviewed: 10/09/2016 Elsevier Interactive Patient Education  2019 [employer].

## 2018-10-06 NOTE — Progress Notes (Signed)
10/06/2018 10:27 AM   David Skinner Hsiao July 03, 1950 [id number]  Referring provider: [name], MD [address] [place], [place] [address]  Chief Complaint  Patient presents with  . Follow-up    HPI: 68 year old male with history of lower urinary tract symptoms.  He was seen February 2020 for his annual follow-up.  He had been on Myrbetriq for storage related symptoms however at that visit was complaining of primarily obstructive voiding symptoms with an IPSS of 24.  A previous trial of tamsulosin was not beneficial and he was given a trial of silodosin.  He has noted significant improvement in his voiding pattern on this medication and is currently satisfied with his voiding pattern.  IPSS completed today was 18/35.  PVR by bladder scan was 13 mL.   PMH: Past Medical History:  Diagnosis Date  . Allergy   . Anxiety   . Atopy   . Cataract of right eye    [facility]  . Chronic constipation   . Elevated LFTs   . Fatty liver   . GERD (gastroesophageal reflux disease)   . Hyperlipidemia   . Hypertension   . Hypoglycemia   . Insomnia   . Memory change   . Overweight   . Paresthesia of hand   . Seizure ([place])    12/12, 09/27/14, 02/28/16  . Tenosynovitis of finger   . Tinea cruris   . Vitamin D deficiency     Surgical History: Past Surgical History:  Procedure Laterality Date  . COLONOSCOPY WITH PROPOFOL N/A 03/25/2017   Procedure: COLONOSCOPY WITH PROPOFOL;  Surgeon: Lin Landsman, MD;  Location: [place];  Service: Endoscopy;  Laterality: N/A;  . ESOPHAGOGASTRODUODENOSCOPY N/A 03/25/2017   Procedure: ESOPHAGOGASTRODUODENOSCOPY (EGD);  Surgeon: Lin Landsman, MD;  Location: [place];  Service: Endoscopy;  Laterality: N/A;  Diabetic - oral meds  . LASIK Bilateral     Home Medications:  Allergies as of 10/06/2018      Reactions   Ibuprofen Itching   Levofloxacin    Lisinopril Cough   Metoprolol    Nsaids Itching    Tolmetin Itching   Tramadol Hcl Itching      Medication List       Accurate as of October 06, 2018 10:27 AM. Always use your most recent med list.        Accu-Chek Aviva Plus w/Device Kit 1 kit by Does not apply route daily.   accu-chek soft touch lancets 1 each by Other route 2 (two) times daily as needed for other. DMII   acetaminophen 325 MG tablet Commonly known as:  TYLENOL Take 650 mg by mouth every 6 (six) hours as needed for moderate pain.   aspirin EC 81 MG tablet Take 1 tablet (81 mg total) by mouth daily.   atorvastatin 80 MG tablet Commonly known as:  LIPITOR Take 1 tablet (80 mg total) by mouth daily. New dose   DULoxetine 60 MG capsule Commonly known as:  CYMBALTA Take 1 capsule (60 mg total) by mouth daily.   fluticasone 50 MCG/ACT nasal spray Commonly known as:  FLONASE Place 2 sprays into both nostrils daily.   glucose blood test strip Commonly known as:  Accu-Chek Aviva Plus 1 each by Other route 2 (two) times daily as needed for other. c   LamoTRIgine 25 MG Tb24 24 hour tablet Commonly known as:  LAMICTAL XR Take by mouth.   levETIRAcetam 250 MG tablet Commonly known as:  KEPPRA  TK 1 T PO AT NIGHT   loratadine 10 MG tablet Commonly known as:  CLARITIN Take 1 tablet (10 mg total) by mouth 2 (two) times daily.   montelukast 10 MG tablet Commonly known as:  SINGULAIR Take 1 tablet (10 mg total) by mouth daily.   silodosin 8 MG Caps capsule Commonly known as:  RAPAFLO Take 1 capsule (8 mg total) by mouth daily with breakfast.   traZODone 100 MG tablet Commonly known as:  DESYREL Take 1 tablet (100 mg total) by mouth at bedtime.   Vitamin D (Ergocalciferol) 1.25 MG (50000 UT) Caps capsule Commonly known as:  DRISDOL Take 1 capsule (50,000 Units total) by mouth every 7 (seven) days.       Allergies:  Allergies  Allergen Reactions  . Ibuprofen Itching  . Levofloxacin   . Lisinopril Cough  . Metoprolol   . Nsaids Itching  .  Tolmetin Itching  . Tramadol Hcl Itching    Family History: Family History  Family history unknown: Yes    Social History:  reports that he quit smoking about 18 years ago. His smoking use included cigarettes. He started smoking about 34 years ago. He has a 15.00 pack-year smoking history. He has never used smokeless tobacco. He reports that he does not drink alcohol or use drugs.  ROS: UROLOGY Frequent Urination?: Yes Hard to postpone urination?: Yes Burning/pain with urination?: No Get up at night to urinate?: Yes Leakage of urine?: Yes Urine stream starts and stops?: Yes Trouble starting stream?: Yes Do you have to strain to urinate?: Yes Blood in urine?: No Urinary tract infection?: No Sexually transmitted disease?: No Injury to kidneys or bladder?: No Painful intercourse?: No Weak stream?: No Erection problems?: No Penile pain?: No  Gastrointestinal Nausea?: No Vomiting?: No Indigestion/heartburn?: No Diarrhea?: No Constipation?: No  Constitutional Fever: No Night sweats?: No Weight loss?: No Fatigue?: No  Skin Skin rash/lesions?: No Itching?: No  Eyes Blurred vision?: Yes Double vision?: No  Ears/Nose/Throat Sore throat?: No Sinus problems?: No  Hematologic/Lymphatic Swollen glands?: No Easy bruising?: No  Cardiovascular Leg swelling?: No Chest pain?: No  Respiratory Cough?: No Shortness of breath?: No  Endocrine Excessive thirst?: No  Musculoskeletal Back pain?: No Joint pain?: Yes  Neurological Headaches?: No Dizziness?: No  Psychologic Depression?: No Anxiety?: No  Physical Exam: BP 120/73 (BP Location: Left Arm, Patient Position: Sitting, Cuff Size: Normal)   Pulse 94   Ht '5\' 5"'$  (1.651 m)   Wt 160 lb 3.2 oz (72.7 kg)   BMI 26.66 kg/m   Constitutional:  Alert and oriented, No acute distress. HEENT: [place] AT, moist mucus membranes.  Trachea midline, no masses. Cardiovascular: No clubbing, cyanosis, or edema. Respiratory:  Normal respiratory effort, no increased work of breathing. Skin: No rashes, bruises or suspicious lesions. Neurologic: Grossly intact, no focal deficits, moving all 4 extremities. Psychiatric: Normal mood and affect.  Laboratory Data: Lab Results  Component Value Date   WBC 6.2 03/03/2017   HGB 16.8 03/03/2017   HCT 50.3 (H) 03/03/2017   MCV 91.1 03/03/2017   PLT 260 03/03/2017    Lab Results  Component Value Date   CREATININE 1.00 07/06/2018    Lab Results  Component Value Date   PSA 0.4 02/05/2017   PSA 0.5 10/30/2012    Assessment & Plan:    1. Lower urinary tract symptoms (LUTS) Symptom improvement on silodosin which he desires to continue.  He is currently satisfied with his voiding pattern.  I did discuss  surgical treatment options both minimally invasive and standard outlet procedures.  He will call back for worsening of his voiding pattern and will otherwise schedule for 1 year.  Return in about 1 year (around 10/06/2019) for Recheck.  Abbie Sons, [place] [address], [place], [place] [address] [phone]

## 2018-10-08 ENCOUNTER — Encounter: Payer: Self-pay | Admitting: Family Medicine

## 2018-10-08 ENCOUNTER — Ambulatory Visit: Payer: Medicare Other | Admitting: Urology

## 2018-10-08 ENCOUNTER — Encounter: Payer: Self-pay | Admitting: Nurse Practitioner

## 2018-10-20 ENCOUNTER — Other Ambulatory Visit: Payer: Self-pay | Admitting: Urology

## 2018-10-20 DIAGNOSIS — R399 Unspecified symptoms and signs involving the genitourinary system: Secondary | ICD-10-CM

## 2018-10-20 NOTE — Telephone Encounter (Signed)
Patient's wife called the office today requesting that we change the patient's prescription for Rapaflo to the [facility].

## 2018-10-21 MED ORDER — SILODOSIN 8 MG PO CAPS: 8.0000 mg | ORAL_CAPSULE | Freq: Every day | ORAL | 11 refills | Status: DC

## 2018-10-21 NOTE — Telephone Encounter (Signed)
Script sent to mail order 

## 2018-10-28 ENCOUNTER — Other Ambulatory Visit: Payer: Self-pay | Admitting: Family Medicine

## 2018-10-28 DIAGNOSIS — E559 Vitamin D deficiency, unspecified: Secondary | ICD-10-CM

## 2018-11-03 ENCOUNTER — Encounter: Payer: Self-pay | Admitting: Family Medicine

## 2018-11-03 ENCOUNTER — Other Ambulatory Visit: Payer: Self-pay

## 2018-11-03 ENCOUNTER — Ambulatory Visit (INDEPENDENT_AMBULATORY_CARE_PROVIDER_SITE_OTHER): Payer: Medicare Other | Admitting: Family Medicine

## 2018-11-03 VITALS — BP 128/78 | HR 100 | Temp 98.0°F | Resp 16 | Ht 65.0 in | Wt 158.0 lb

## 2018-11-03 DIAGNOSIS — E1140 Type 2 diabetes mellitus with diabetic neuropathy, unspecified: Secondary | ICD-10-CM

## 2018-11-03 DIAGNOSIS — F5101 Primary insomnia: Secondary | ICD-10-CM

## 2018-11-03 DIAGNOSIS — F325 Major depressive disorder, single episode, in full remission: Secondary | ICD-10-CM | POA: Diagnosis not present

## 2018-11-03 DIAGNOSIS — I10 Essential (primary) hypertension: Secondary | ICD-10-CM | POA: Diagnosis not present

## 2018-11-03 DIAGNOSIS — N529 Male erectile dysfunction, unspecified: Secondary | ICD-10-CM

## 2018-11-03 DIAGNOSIS — E559 Vitamin D deficiency, unspecified: Secondary | ICD-10-CM

## 2018-11-03 DIAGNOSIS — E785 Hyperlipidemia, unspecified: Secondary | ICD-10-CM

## 2018-11-03 DIAGNOSIS — J302 Other seasonal allergic rhinitis: Secondary | ICD-10-CM

## 2018-11-03 DIAGNOSIS — M7712 Lateral epicondylitis, left elbow: Secondary | ICD-10-CM

## 2018-11-03 DIAGNOSIS — G40209 Localization-related (focal) (partial) symptomatic epilepsy and epileptic syndromes with complex partial seizures, not intractable, without status epilepticus: Secondary | ICD-10-CM

## 2018-11-03 DIAGNOSIS — N521 Erectile dysfunction due to diseases classified elsewhere: Secondary | ICD-10-CM | POA: Diagnosis not present

## 2018-11-03 DIAGNOSIS — F1021 Alcohol dependence, in remission: Secondary | ICD-10-CM | POA: Diagnosis not present

## 2018-11-03 DIAGNOSIS — G4733 Obstructive sleep apnea (adult) (pediatric): Secondary | ICD-10-CM

## 2018-11-03 DIAGNOSIS — J3089 Other allergic rhinitis: Secondary | ICD-10-CM

## 2018-11-03 LAB — POCT GLYCOSYLATED HEMOGLOBIN (HGB A1C): HbA1c, POC (controlled diabetic range): 6.3 % (ref 0.0–7.0)

## 2018-11-03 MED ORDER — TADALAFIL 5 MG PO TABS: 5.0000 mg | ORAL_TABLET | Freq: Every day | ORAL | 5 refills | Status: DC | PRN

## 2018-11-03 MED ORDER — TRAZODONE HCL 100 MG PO TABS: 100.0000 mg | ORAL_TABLET | Freq: Every day | ORAL | 0 refills | Status: DC

## 2018-11-03 NOTE — Patient Instructions (Signed)
Khu?u tay qu?n v?t [place]  Khu?u tay qu?n v?t (b?nh vim l?i c?u ngoi x??ng cnh tay) l vim gn trn c?ng tay pha ngoi, g?n khu?u tay qu v?. Gn l cc m n?i c? v?i x??ng. Khi qu v? b? khu?u tay qu?n v?t, tnh tr?ng vim ?nh h??ng ??n cc gn m qu v? s? d?ng ?? u?n cong c? tay v c? ??ng bn tay ln trn. Tnh tr?ng vim x?y ra ? ph?n d??i c?a x??ng cnh tay trn (x??ng cnh tay), n?i gn n?i v?i x??ng (l?i c?u ngoi). Khu?u tay qu?n v?t th??ng ?nh h??ng ??n nh?ng ng??i ch?i qu?n v?t, nh?ng b?t c? ai c?ng c th? b? tnh tr?ng ny khi du?i c? tay ho?c xoay c?ng tay l?p ?i l?p l?i. Nguyn nhn g gy ra? Tnh tr?ng ny th??ng do vi?c du?i c? tay, xoay c?ng tay v dng bn tay l?p ?i l?p l?i. Tnh tr?ng ny c th? l do ch?i cc mn th? thao ho?c th?c hi?n cc ??ng tc ? c?ng tay l?p ?i l?p l?i. Trong m?t s? tr??n h?p, tnh tr?ng ny c th? do ch?n th??ng ??t ng?t gy ra. ?i?u g lm t?ng nguy c?? Qu v? d? b? khu?u tay qu?n v?t h?n n?u qu v? ch?i qu?n v?t ho?c m?t mn th? thao c dng v?t khc. Qu v? c?ng c nguy c? cao h?n n?u th??ng xuyn lm vi?c b?ng hai bn tay. Bn c?nh nh?ng ng??i ch?i qu?n v?t, nh?ng ng??i khc c nguy c? cao h?n bao g?m:  Nh?c s?.  Th? m?c, th? s?n v th? ?ng n??c.  Ng??i n?u ?n.  Th? qu?.  Nh?ng ng??i lm vi?c trong nh my.  Cng nhn xy d?ng.  Th? m? th?t.  Nh?ng ng??i s? d?ng my tnh. Cc d?u hi?u ho?c tri?u ch?ng l g? Nh?ng tri?u ch?ng c?a tnh tr?ng ny bao g?m:  ?au v nh?y c?m ?au ? c?ng tay v ph?n ngoi khu?u tay. Ch? c?m th?y ?au khi s? d?ng cnh tay, ho?c c th? lc no c?ng ?au ? ?.  C?m gic b?ng rt b?t ??u ? khu?u tay v lan xu?ng c?ng tay.  N?m bn tay y?u. Ch?n ?on tnh tr?ng ny nh? th? no? Tnh tr?ng ny c th? ???c ch?n ?on d?a vo:  Tri?u ch?ng v b?nh s? c?a qu v?.  Khm th?c th?.  Ch?p X quang.  Ch?p MRI. Tnh tr?ng ny ???c ?i?u tr? nh? th? no? Cch ?i?u tr? ban ??u th??ng l ngh? ng?i v ch??m ?  l?nh vo cnh tay. Chuyn gia ch?m [place] s?c kh?e c?a qu v? c?ng c th? khuy?n ngh?:  Thu?c ?? gi?m ?au v gi?m vim. Nh?ng thu?c ny c th? ? d?ng vin, gel bi t?i ch?, ho?c tim thu?c steroid (cortisone).  Dy ?ai khu?u tay ?? gi?m p l?c ln khu v?c ?.  V?t l tr? li?u. Li?u php ny c th? bao g?m mt-xa ho?c bi t?p th? d?c.  N?p khu?u tay ?? h?n ch? v?n ??ng gy ra cc tri?u ch?ng. N?u cc ph??ng php ?i?u tr? ny khng gip lm gi?m cc tri?u ch?ng, chuyn gia ch?m [place] s?c kh?e c?a qu v? c th? khuy?n ngh? ph?u thu?t ?? lo?i b? c? b? t?n th??ng v g?n l?i c? kh?e m?nh vo x??ng. Tun th? nh?ng h??ng d?n ny ? nh: Ho?t ??ng  ?? khu?u tay v c? tay ngh? ng?i v trnh cc ho?t ??ng gy ra cc tri?u ch?ng, theo ch? d?n  c?a chuyn gia ch?m [place] s?c kh?e.  T?p cc bi t?p v?t l tr? li?u theo ch? d?n.  N?u qu v? nng ?? v?t, hy nng v?i lng bn tay h??ng ln trn. Nng theo cch ny lm gi?m s?c p ln khu?u tay. L?i s?ng  Trong tr??ng h?p khu?u tay qu?n v?t l do ch?i cc mn th? thao, hy ki?m tra d?ng c? c?a qu v? v ??m b?o r?ng: ? Qu v? s? d?ng ?ng cch. ? D?ng c? ? ph h?p nh?t v?i qu v?.  Trong tr??ng h?p khu?u tay qu?n v?t l do lm vi?c ho?c s? d?ng my vi tnh, hy ngh? ng?i th??ng xuyn ?? du?i cnh tay. Hy trao ??i v?i qu?n l c?a qu v? v? cch x? tr tnh tr?ng ny t?i n?i lm vi?c. N?u qu v? c n?p:  ?eo n?p ho?c ?ai ?eo theo ch? d?n c?a chuyn gia ch?m [place] s?c kh?e. Ch? tho ra theo ch? d?n c?a chuyn gia ch?m [place] s?c kh?e.  N?i l?ng n?p n?u cc ngn tay b? ?au bu?t, t ho?c tr?? nn l?nh v xanh ti.  Gi? cho n?p s?ch s?.  N?u n?p khng ch?ng th?m n??c, hy h?i xem qu v? c th? tho ra khi t?m khng. N?u qu v? b?t bu?c ph?i ?? nguyn khi t?m: ? Khng ?? n b? ??t. ? B?c n b?ng l?p ph? khng th?m n??c khi qu v? t?m b?n ho?c t?m vi sen. H??ng d?n chung   N?u ???c ch? d?n, hy ch??m ? l?nh ln vng b? ?au: ? Cho ? l?nh vo ti ni lng. ? ??  kh?n t?m ? gi?a da v ti ch??m. ? Ch??m ? l?nh trong 20 pht, 2-3 l?n m?i ngy.  Ch? s? d?ng thu?c khng k ??n v thu?c k ??n theo ch? d?n c?a chuyn gia ch?m [place] s?c kh?e.  Tun th? t?t c? cc l?n khm theo di theo ch? d?n c?a chuyn gia ch?m [place] s?c kh?e. ?i?u ny c vai tr quan tr?ng. Hy lin l?c v?i chuyn gia ch?m [place] s?c kh?e n?u:  Quy? vi? bi? ?au tr?m tr?ng h?n ho??c khng ??? khi ?i?u tr?Sander Nephew v? b? t b ho?c y?u ? c?ng tay, bn tay, ho?c ngn tay. Tm t?t  Khu?u tay qu?n v?t (b?nh vim l?i c?u ngoi x??ng cnh tay) l vim gn trn c?ng tay pha ngoi, g?n khu?u tay qu v?.  Cc tri?u ch?ng th??ng g?p bao g?m ?au v nh?y c?m ?au ? c?ng tay v ph?n bn ngoi khu?u tay.  Tnh tr?ng ny th??ng do vi?c du?i c? tay, xoay c?ng tay v dng bn tay l?p ?i l?p l?i.  Cch ?i?u tr? ban ??u th??ng l ngh? ng?i v ch??m ? l?nh vo cnh tay ?? lm gi?m cc tri?u ch?ng. ?i?u tr? b? sung c th? bao g?m dng thu?c, v?t l tr? li?u, ?eo n?p ho?c ?ai ?eo, ho?c ph?u thu?t. Thng tin ny khng nh?m m?c ?ch thay th? cho l?i khuyn m chuyn gia ch?m [place] s?c kh?e ni v?i qu v?. Hy b?o ??m qu v? ph?i th?o lu?n b?t k? v?n ?? g m qu v? c v?i chuyn gia ch?m [place] s?c kh?e c?a qu v?. Document Released: 06/03/2005 Document Revised: 06/16/2017 Document Reviewed: 06/16/2017 [employer] Interactive Patient Education  2019 [employer].

## 2018-11-03 NOTE — Progress Notes (Signed)
Name: David Skinner   MRN: [id number]    DOB: Nov 10, 1950   Date:11/03/2018       Progress Note  Subjective  Chief Complaint  Chief Complaint  Patient presents with  . Medication Refill  . Diabetes  . Depression  . Sleep Apnea  . Hypertension  . Allergic Rhinitis   . Numbness    Left Arm Onset-1 month, Down his arm and would like to discuss    HPI  Translator was present with him, Namon Cirri   Major depressionin remission: going on for years but  worsein 2018, he is on Cymbalta since April 2018, he states stress is lower , he is enjoying working in his yard. Doing well at this time, sleeping well also  Hyperlipidemia:he is taking Atorvastatin but only at80 mg daily No myalgia, no chest pain  OSA/minimal/RLS: he sees Dr Manuella Ghazi for memory loss and had sleep study2018it showed minimal sleep apnea and RLS -seeing Dr. Manuella Ghazi. He is still on Trazodone and is sleeping well. Unchanged  HTN:bp was elevated today in our office, but usually at goal at home, and also on his last visit, no chest pain or palpitation. BP still normal, unchanged   History ofAlcoholism: he used to drink heavy while in [place] used to drink liquor at least 3 times a week and used to get drunk, when he moved to [place] ( 1979). He was still drinking better but quit in2017. Still not drinking alcohol   Urinary frequency: seen by Urologist and states medication is working well for him now  Partial Seizure: Last episode 02/2016 and went to [facility],taking medication daily now and isseeing Dr. Manuella Ghazi. No recent episodes. He states taking Keppra and lamictal again.   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 Discussed low  carbohydrate diet   Left lateral epicondylitis: he is doing a lot of projects at home, he is right hand dominant but uses left hand for any labor activity, like using a hammer, noticed pain months ago, pain is worse with activity. He cannot take nsaid's  Patient Active Problem List   Diagnosis Date Noted  . OSA (obstructive sleep apnea) 12/09/2016  . RLS (restless legs syndrome) 12/09/2016  . Episode of moderate major depression ([place]) 12/09/2016  . History of alcoholism ([place]) 07/22/2016  . Cervical radiculitis 01/09/2016  . Lumbosacral radiculitis 01/09/2016  . Osteoarthritis of carpometacarpal (CMC) joint of thumb 01/09/2016  . Tinea cruris 03/21/2015  . 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 Depression with anxiety 11/28/2014  . Chronic constipation 11/28/2014  . Insomnia 11/28/2014  . Dyslipidemia 11/28/2014  . Fatty infiltration of liver 11/28/2014  . Essential (primary) hypertension 11/28/2014  . Gastric reflux 11/28/2014  . Memory loss or impairment 11/28/2014  . Perennial allergic rhinitis with seasonal variation 11/28/2014  . Vitamin D deficiency 11/28/2014  . Tenosynovitis of thumb 11/28/2014  . Partial epilepsy with impairment of consciousness ([place]) 11/16/2014    Past Surgical History:  Procedure Laterality Date  . COLONOSCOPY WITH PROPOFOL N/A 03/25/2017   Procedure: COLONOSCOPY WITH PROPOFOL;  Surgeon: Lin Landsman, MD;  Location: [facility];  Service: Endoscopy;  Laterality: N/A;  . ESOPHAGOGASTRODUODENOSCOPY N/A 03/25/2017  Procedure: ESOPHAGOGASTRODUODENOSCOPY (EGD);  Surgeon: Lin Landsman, MD;  Location: [facility];  Service: Endoscopy;  Laterality: N/A;  Diabetic - oral meds  . LASIK Bilateral     Family History  Family history unknown: Yes    Social History   Socioeconomic History  . Marital status: Married    Spouse name: [name]  . Number of children: 3  . Years of  education: Not on file  . Highest education level: 12th grade  Occupational History  . Occupation: Retired   Scientific laboratory technician  . Financial resource strain: Not hard at all  . Food insecurity:    Worry: Never true    Inability: Never true  . Transportation needs:    Medical: No    Non-medical: No  Tobacco Use  . Smoking status: Former Smoker    Packs/day: 1.00    Years: 15.00    Pack years: 15.00    Types: Cigarettes    Start date: 06/17/1984    Last attempt to quit: 11/28/1999    Years since quitting: 18.9  . Smokeless tobacco: Never Used  Substance and Sexual Activity  . Alcohol use: No    Alcohol/week: 0.0 standard drinks    Comment: used to drink a pack on weekends.   . Drug use: No  . Sexual activity: Yes    Partners: Female  Lifestyle  . Physical activity:    Days per week: 5 days    Minutes per session: 60 min  . Stress: Not at all  Relationships  . Social connections:    Talks on phone: More than three times a week    Gets together: More than three times a week    Attends religious service: 1 to 4 times per year    Active member of club or organization: No    Attends meetings of clubs or organizations: Never    Relationship status: Living with partner  . Intimate partner violence:    Fear of current or ex partner: No    Emotionally abused: No    Physically abused: No    Forced sexual activity: No  Other Topics Concern  . Not on file  Social History Narrative   Originally from [place]      Current Outpatient Medications:  .  acetaminophen (TYLENOL) 325 MG tablet, Take 650 mg by mouth every 6 (six) hours as needed for moderate pain., Disp: , Rfl:  .  aspirin EC 81 MG tablet, Take 1 tablet (81 mg total) by mouth daily., Disp: 30 tablet, Rfl: 0 .  atorvastatin (LIPITOR) 80 MG tablet, Take 1 tablet (80 mg total) by mouth daily. New dose, Disp: 90 tablet, Rfl: 1 .  Blood Glucose Monitoring Suppl (ACCU-CHEK AVIVA PLUS) w/Device KIT, 1 kit by Does not apply route  daily., Disp: 1 kit, Rfl: 0 .  DULoxetine (CYMBALTA) 60 MG capsule, Take 1 capsule (60 mg total) by mouth daily., Disp: 90 capsule, Rfl: 1 .  fluticasone (FLONASE) 50 MCG/ACT nasal spray, Place 2 sprays into both nostrils daily., Disp: 42 g, Rfl: 0 .  glucose blood (ACCU-CHEK AVIVA PLUS) test strip, 1 each by Other route 2 (two) times daily as needed for other. c, Disp: 100 each, Rfl: 3 .  Lancets (ACCU-CHEK SOFT TOUCH) lancets, 1 each by Other route 2 (two) times daily as needed for other. DMII, Disp: 100 each, Rfl: 3 .  levETIRAcetam (KEPPRA) 250 MG tablet, TK 1 T PO AT NIGHT, Disp: , Rfl:  .  loratadine (CLARITIN) 10 MG tablet, Take 1 tablet (10 mg total) by mouth 2 (two) times daily., Disp: 180 tablet, Rfl: 1 .  montelukast (SINGULAIR) 10 MG tablet, Take 1 tablet (10 mg total) by mouth daily., Disp: 90 tablet, Rfl: 1 .  silodosin (RAPAFLO) 8 MG CAPS capsule, Take 1 capsule (8 mg total) by mouth daily with breakfast., Disp: 30 capsule, Rfl: 11 .  traZODone (DESYREL) 100 MG tablet, Take 1 tablet (100 mg total) by mouth at bedtime., Disp: 90 tablet, Rfl: 0 .  Vitamin D, Ergocalciferol, (DRISDOL) 1.25 MG (50000 UT) CAPS capsule, TAKE ONE CAPSULE BY MOUTH EVERY 7 DAYS, Disp: 12 capsule, Rfl: 0 .  LamoTRIgine (LAMICTAL XR) 25 MG TB24 24 hour tablet, Take by mouth., Disp: , Rfl:  .  tadalafil (CIALIS) 5 MG tablet, Take 1 tablet (5 mg total) by mouth daily as needed for erectile dysfunction., Disp: 30 tablet, Rfl: 5  Allergies  Allergen Reactions  . Ibuprofen Itching  . Levofloxacin   . Lisinopril Cough  . Metoprolol   . Nsaids Itching  . Tolmetin Itching  . Tramadol Hcl Itching    I personally reviewed active problem list, medication list, allergies, family history, social history with the patient/caregiver today.   ROS  Constitutional: Negative for fever or weight change.  Respiratory: Negative for cough and shortness of breath.   Cardiovascular: Negative for chest pain or palpitations.   Gastrointestinal: Negative for abdominal pain, no bowel changes.  Musculoskeletal: Negative for gait problem or joint swelling.  Skin: Negative for rash.  Neurological: Negative for dizziness or headache.  No other specific complaints in a complete review of systems (except as listed in HPI above).  Objective  Vitals:   11/03/18 1045  BP: (!) 142/86  Pulse: 100  Resp: 16  Temp: 98 F (36.7 C)  TempSrc: Oral  SpO2: 97%  Weight: 158 lb (71.7 kg)  Height: _0  (1.651 m)    Body mass index is 26.29 kg/m.  Physical Exam  Constitutional: Patient appears well-developed and well-nourished. Overweight No distress.  HEENT: head atraumatic, normocephalic, pupils equal and reactive to light, neck supple, wearing surgical mask  Cardiovascular: Normal rate, regular rhythm and normal heart sounds.  No murmur heard. No BLE edema. Pulmonary/Chest: Effort normal and breath sounds normal. No respiratory distress. Abdominal: Soft.  There is no tenderness. Psychiatric: Patient has a normal mood and affect. behavior is normal. Judgment and thought content normal.  Recent Results (from the past 2160 hour(s))  Bladder Scan (Post Void Residual) in office     Status: None   Collection Time: 10/06/18 10:09 AM  Result Value Ref Range   Scan Result 73m   POCT HgB A1C     Status: Normal   Collection Time: 11/03/18 10:52 AM  Result Value Ref Range   Hemoglobin A1C     HbA1c POC (<> result, manual entry)     HbA1c, POC (prediabetic range)     HbA1c, POC (controlled diabetic range) 6.3 0.0 - 7.0 %    Diabetic Foot Exam: Diabetic Foot Exam - Simple   Simple Foot Form Diabetic Foot exam was performed with the following findings:  Yes 11/03/2018 11:21 AM  Visual Inspection See comments:  Yes Sensation Testing Intact to touch and monofilament testing bilaterally:  Yes Pulse Check Posterior Tibialis and Dorsalis pulse intact bilaterally:  Yes Comments Dry heels      PHQ2/9: Depression  [facility] 11/03/2018 07/30/2018 05/04/2018 04/08/2018 01/05/2018  Decreased Interest 0 0  0 0 0  Down, Depressed, Hopeless 0 0 0 0 0  PHQ - 2 Score 0 0 0 0 0  Altered sleeping 0 3 0 0 0  Tired, decreased energy 0 3 0 0 0  Change in appetite 0 0 0 0 2  Feeling bad or failure about yourself  1 0 0 0 0  Trouble concentrating 1 0 0 0 0  Moving slowly or fidgety/restless 0 0 0 0 0  Suicidal thoughts 0 0 0 0 0  PHQ-9 Score 2 6 0 0 2  Difficult doing work/chores Somewhat difficult Not difficult at all Not difficult at all Not difficult at all Somewhat difficult  Some recent data might be hidden    phq 9 is negative   Fall Risk: Fall Risk  11/03/2018 07/30/2018 05/04/2018 04/08/2018 01/05/2018  Falls in the past year? 0 0 0 No No  Number falls in past yr: 0 0 0 - -  Injury with Fall? 0 0 0 - -      Functional Status Survey: Is the patient deaf or have difficulty hearing?: No Does the patient have difficulty seeing, even when wearing glasses/contacts?: No Does the patient have difficulty concentrating, remembering, or making decisions?: No Does the patient have difficulty walking or climbing stairs?: No Does the patient have difficulty dressing or bathing?: No Does the patient have difficulty doing errands alone such as visiting a doctor's office or shopping?: No    Assessment & Plan  1. 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 History of alcoholism ([place])  Doing well at this time  3. Essential (primary) hypertension  BP slightly up when he arrived, usually below 110's at home and also on his last visit   4. Major depression in remission ([place])  Continue medicaitons  5. Dyslipidemia  Continue statin   6. Perennial allergic rhinitis with seasonal variation   7. Partial epilepsy with impairment of consciousness ([place])  Keep follow up with neurologist   8. Vitamin D deficiency   9. OSA (obstructive sleep  apnea)   10. Left lateral epicondylitis  He is allergic to NSAID's advised otc Biofreeze and avoiding using left arm when working on projects at home   11. ED (erectile dysfunction) of organic origin  - tadalafil (CIALIS) 5 MG tablet; Take 1 tablet (5 mg total) by mouth daily as needed for erectile dysfunction.  Dispense: 30 tablet; Refill: 5  12. Primary insomnia  - traZODone (DESYREL) 100 MG tablet; Take 1 tablet (100 mg total) by mouth at bedtime.  Dispense: 90 tablet; Refill: 0

## 2018-11-13 DIAGNOSIS — Z79899 Other long term (current) drug therapy: Secondary | ICD-10-CM | POA: Diagnosis not present

## 2018-11-13 DIAGNOSIS — G40219 Localization-related (focal) (partial) symptomatic epilepsy and epileptic syndromes with complex partial seizures, intractable, without status epilepticus: Secondary | ICD-10-CM | POA: Diagnosis not present

## 2018-11-24 DIAGNOSIS — G40219 Localization-related (focal) (partial) symptomatic epilepsy and epileptic syndromes with complex partial seizures, intractable, without status epilepticus: Secondary | ICD-10-CM | POA: Diagnosis not present

## 2018-11-24 DIAGNOSIS — F5101 Primary insomnia: Secondary | ICD-10-CM | POA: Diagnosis not present

## 2018-11-24 DIAGNOSIS — R413 Other amnesia: Secondary | ICD-10-CM | POA: Diagnosis not present

## 2018-12-03 DIAGNOSIS — H40003 Preglaucoma, unspecified, bilateral: Secondary | ICD-10-CM | POA: Diagnosis not present

## 2018-12-24 DIAGNOSIS — F5101 Primary insomnia: Secondary | ICD-10-CM | POA: Diagnosis not present

## 2018-12-24 DIAGNOSIS — G40219 Localization-related (focal) (partial) symptomatic epilepsy and epileptic syndromes with complex partial seizures, intractable, without status epilepticus: Secondary | ICD-10-CM | POA: Diagnosis not present

## 2018-12-24 DIAGNOSIS — R251 Tremor, unspecified: Secondary | ICD-10-CM | POA: Diagnosis not present

## 2018-12-24 DIAGNOSIS — M25572 Pain in left ankle and joints of left foot: Secondary | ICD-10-CM | POA: Diagnosis not present

## 2018-12-24 DIAGNOSIS — R413 Other amnesia: Secondary | ICD-10-CM | POA: Diagnosis not present

## 2018-12-24 DIAGNOSIS — R42 Dizziness and giddiness: Secondary | ICD-10-CM | POA: Diagnosis not present

## 2018-12-31 DIAGNOSIS — M25572 Pain in left ankle and joints of left foot: Secondary | ICD-10-CM | POA: Diagnosis not present

## 2018-12-31 DIAGNOSIS — M79672 Pain in left foot: Secondary | ICD-10-CM | POA: Diagnosis not present

## 2019-02-09 ENCOUNTER — Ambulatory Visit (INDEPENDENT_AMBULATORY_CARE_PROVIDER_SITE_OTHER): Payer: Medicare Other | Admitting: Family Medicine

## 2019-02-09 ENCOUNTER — Encounter: Payer: Self-pay | Admitting: Family Medicine

## 2019-02-09 ENCOUNTER — Other Ambulatory Visit: Payer: Self-pay

## 2019-02-09 VITALS — BP 118/78 | HR 89 | Temp 96.9°F | Resp 16 | Ht 65.0 in | Wt 160.1 lb

## 2019-02-09 DIAGNOSIS — Z23 Encounter for immunization: Secondary | ICD-10-CM

## 2019-02-09 DIAGNOSIS — E785 Hyperlipidemia, unspecified: Secondary | ICD-10-CM | POA: Diagnosis not present

## 2019-02-09 DIAGNOSIS — G4733 Obstructive sleep apnea (adult) (pediatric): Secondary | ICD-10-CM

## 2019-02-09 DIAGNOSIS — J302 Other seasonal allergic rhinitis: Secondary | ICD-10-CM

## 2019-02-09 DIAGNOSIS — E1140 Type 2 diabetes mellitus with diabetic neuropathy, unspecified: Secondary | ICD-10-CM

## 2019-02-09 DIAGNOSIS — E559 Vitamin D deficiency, unspecified: Secondary | ICD-10-CM

## 2019-02-09 DIAGNOSIS — I10 Essential (primary) hypertension: Secondary | ICD-10-CM

## 2019-02-09 DIAGNOSIS — G40209 Localization-related (focal) (partial) symptomatic epilepsy and epileptic syndromes with complex partial seizures, not intractable, without status epilepticus: Secondary | ICD-10-CM

## 2019-02-09 DIAGNOSIS — N3281 Overactive bladder: Secondary | ICD-10-CM

## 2019-02-09 DIAGNOSIS — F325 Major depressive disorder, single episode, in full remission: Secondary | ICD-10-CM | POA: Diagnosis not present

## 2019-02-09 DIAGNOSIS — N521 Erectile dysfunction due to diseases classified elsewhere: Secondary | ICD-10-CM

## 2019-02-09 DIAGNOSIS — F1021 Alcohol dependence, in remission: Secondary | ICD-10-CM

## 2019-02-09 DIAGNOSIS — F5101 Primary insomnia: Secondary | ICD-10-CM

## 2019-02-09 DIAGNOSIS — J3089 Other allergic rhinitis: Secondary | ICD-10-CM

## 2019-02-09 LAB — POCT GLYCOSYLATED HEMOGLOBIN (HGB A1C): HbA1c, POC (controlled diabetic range): 6.3 % (ref 0.0–7.0)

## 2019-02-09 MED ORDER — ATORVASTATIN CALCIUM 80 MG PO TABS: 80.0000 mg | ORAL_TABLET | Freq: Every day | ORAL | 1 refills | Status: DC

## 2019-02-09 MED ORDER — FLUTICASONE PROPIONATE 50 MCG/ACT NA SUSP: 2.0000 | Freq: Every day | NASAL | 0 refills | Status: DC

## 2019-02-09 MED ORDER — TRAZODONE HCL 100 MG PO TABS: 100.0000 mg | ORAL_TABLET | Freq: Every day | ORAL | 1 refills | Status: DC

## 2019-02-09 MED ORDER — LORATADINE 10 MG PO TABS: 10.0000 mg | ORAL_TABLET | Freq: Two times a day (BID) | ORAL | 1 refills | Status: DC

## 2019-02-09 MED ORDER — DULOXETINE HCL 60 MG PO CPEP: 60.0000 mg | ORAL_CAPSULE | Freq: Every day | ORAL | 1 refills | Status: DC

## 2019-02-09 MED ORDER — MONTELUKAST SODIUM 10 MG PO TABS: 10.0000 mg | ORAL_TABLET | Freq: Every day | ORAL | 1 refills | Status: DC

## 2019-02-09 MED ORDER — VITAMIN D (ERGOCALCIFEROL) 1.25 MG (50000 UNIT) PO CAPS: 50000.0000 [IU] | ORAL_CAPSULE | ORAL | 1 refills | Status: DC

## 2019-02-09 NOTE — Progress Notes (Signed)
Name: David Skinner   MRN: [id number]    DOB: 1950/07/08   Date:02/09/2019       Progress Note  Subjective  Chief Complaint  Chief Complaint  Patient presents with  . Medication Refill    3 month F/U  . Diabetes    Checks daily-pre-prandial -118  . Depression  . Hypertension  . Sleep Apnea  . Allergic Rhinitis     HPI  Translator was present with him, [place]   Major depressionin remission: going on for years but  worsein 2018, he is on Cymbalta since April 2018, he states he is still working around the house and doing well, normal phq 9 today , but he states he feels lazy and tired at times  Hyperlipidemia:he is taking Atorvastatin but only at80 mg dailyNo myalgia, no chest pain or decrease in exercise tolerance   OSA/minimal/RLS: he sees Dr Manuella Ghazi for memory loss and had sleep study2018it showed minimal sleep apnea and RLS -seeing Dr. Manuella Ghazi.He is still on Trazodoneand is stable, able  History of hypertension:bp is towards low end of normal today,he states he has noticed that when he gets up fast he gets dizzy , he has been off medications, advised to stay hydrated and get up slowly to avoid falls.   History ofAlcoholism: he used to drink heavy while in [place] used to drink liquor at least 3 times a week and used to get drunk, when he moved to [place] ( 1979). He was still drinking better but quit in2017. Unchanged   Urinary frequency: seen by Urologist and states medication is working well for him now. Stable   Partial Seizure: Last episode 02/2016 and went to [facility],taking medication daily now and isseeing Dr. Manuella Ghazi. No recent episodes. He states taking Keppra and lamictal again.   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 OSA (obstructive sleep apnea) 12/09/2016  . RLS (restless legs syndrome) 12/09/2016  . Episode of moderate major depression ([place]) 12/09/2016  . History of alcoholism ([place]) 07/22/2016  . Cervical radiculitis 01/09/2016  . Lumbosacral radiculitis 01/09/2016  . Osteoarthritis of carpometacarpal (CMC) joint of thumb 01/09/2016  . Tinea cruris 03/21/2015  . 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 Depression with anxiety 11/28/2014  . Chronic constipation 11/28/2014  . Insomnia 11/28/2014  . Dyslipidemia 11/28/2014  . Fatty infiltration of liver 11/28/2014  . Essential (primary) hypertension 11/28/2014  . Gastric reflux 11/28/2014  . Memory loss or impairment 11/28/2014  . Perennial allergic rhinitis with seasonal variation 11/28/2014  . Vitamin D deficiency 11/28/2014  . Tenosynovitis of thumb 11/28/2014  . Partial epilepsy with impairment of consciousness ([place]) 11/16/2014    Past Surgical History:  Procedure Laterality Date  . COLONOSCOPY WITH PROPOFOL N/A 03/25/2017   Procedure: COLONOSCOPY WITH PROPOFOL;  Surgeon: Lin Landsman, MD;  Location: [facility];  Service: Endoscopy;  Laterality: N/A;  . ESOPHAGOGASTRODUODENOSCOPY N/A 03/25/2017   Procedure: ESOPHAGOGASTRODUODENOSCOPY (EGD);  Surgeon: Lin Landsman, MD;  Location: [facility];  Service: Endoscopy;  Laterality: N/A;  Diabetic - oral meds  .  LASIK Bilateral     Family History  Family history unknown: Yes    Social History   Socioeconomic History  . Marital status: Married    Spouse name: [name]  . Number of children: 3  . Years of education: Not on file  . Highest education level: 12th grade  Occupational History  . Occupation: Retired   Scientific laboratory technician  . Financial resource strain: Not hard at all  . Food insecurity     Worry: Never true    Inability: Never true  . Transportation needs    Medical: No    Non-medical: No  Tobacco Use  . Smoking status: Former Smoker    Packs/day: 1.00    Years: 15.00    Pack years: 15.00    Types: Cigarettes    Start date: 06/17/1984    Quit date: 11/28/1999    Years since quitting: 19.2  . Smokeless tobacco: Never Used  Substance and Sexual Activity  . Alcohol use: No    Alcohol/week: 0.0 standard drinks    Comment: used to drink a pack on weekends.   . Drug use: No  . Sexual activity: Yes    Partners: Female  Lifestyle  . Physical activity    Days per week: 5 days    Minutes per session: 60 min  . Stress: Not at all  Relationships  . Social connections    Talks on phone: More than three times a week    Gets together: More than three times a week    Attends religious service: 1 to 4 times per year    Active member of club or organization: No    Attends meetings of clubs or organizations: Never    Relationship status: Living with partner  . Intimate partner violence    Fear of current or ex partner: No    Emotionally abused: No    Physically abused: No    Forced sexual activity: No  Other Topics Concern  . Not on file  Social History Narrative   Originally from [place]      Current Outpatient Medications:  .  acetaminophen (TYLENOL) 325 MG tablet, Take 650 mg by mouth every 6 (six) hours as needed for moderate pain., Disp: , Rfl:  .  aspirin EC 81 MG tablet, Take 1 tablet (81 mg total) by mouth daily., Disp: 30 tablet, Rfl: 0 .  atorvastatin (LIPITOR) 80 MG tablet, Take 1 tablet (80 mg total) by mouth daily. New dose, Disp: 90 tablet, Rfl: 1 .  Blood Glucose Monitoring Suppl (ACCU-CHEK AVIVA PLUS) w/Device KIT, 1 kit by Does not apply route daily., Disp: 1 kit, Rfl: 0 .  donepezil (ARICEPT) 5 MG tablet, Take by mouth., Disp: , Rfl:  .  DULoxetine (CYMBALTA) 60 MG capsule, Take 1 capsule (60 mg total) by mouth daily., Disp: 90 capsule, Rfl: 1 .   fluticasone (FLONASE) 50 MCG/ACT nasal spray, Place 2 sprays into both nostrils daily., Disp: 42 g, Rfl: 0 .  glucose blood (ACCU-CHEK AVIVA PLUS) test strip, 1 each by Other route 2 (two) times daily as needed for other. c, Disp: 100 each, Rfl: 3 .  LamoTRIgine 100 MG TB24 24 hour tablet, Take by mouth., Disp: , Rfl:  .  Lancets (ACCU-CHEK SOFT TOUCH) lancets, 1 each by Other route 2 (two) times daily as needed for other. DMII, Disp: 100 each, Rfl: 3 .  levETIRAcetam (KEPPRA) 250 MG tablet, TK 1 T PO AT NIGHT, Disp: , Rfl:  .  loratadine (CLARITIN) 10 MG tablet, Take 1 tablet (10 mg total) by mouth 2 (two) times daily., Disp: 180 tablet, Rfl: 1 .  montelukast (SINGULAIR) 10 MG tablet, Take 1 tablet (10 mg total) by mouth daily., Disp: 90 tablet, Rfl: 1 .  silodosin (RAPAFLO) 8 MG CAPS capsule, Take 1 capsule (8 mg total) by mouth daily with breakfast., Disp: 30 capsule, Rfl: 11 .  tadalafil (CIALIS) 5 MG tablet, Take 1 tablet (5 mg total) by mouth daily as needed for erectile dysfunction., Disp: 30 tablet, Rfl: 5 .  traZODone (DESYREL) 100 MG tablet, Take 1 tablet (100 mg total) by mouth at bedtime., Disp: 90 tablet, Rfl: 0 .  Vitamin D, Ergocalciferol, (DRISDOL) 1.25 MG (50000 UT) CAPS capsule, TAKE ONE CAPSULE BY MOUTH EVERY 7 DAYS, Disp: 12 capsule, Rfl: 0  Allergies  Allergen Reactions  . Ibuprofen Itching  . Levofloxacin   . Lisinopril Cough  . Metoprolol   . Nsaids Itching  . Tolmetin Itching  . Tramadol Hcl Itching    I personally reviewed active problem list, medication list, allergies, family history, social history, health maintenance with the patient/caregiver today.   ROS  Constitutional: Negative for fever or weight change.  Respiratory: Negative for cough and shortness of breath.   Cardiovascular: Negative for chest pain or palpitations.  Gastrointestinal: Negative for abdominal pain, no bowel changes.  Musculoskeletal: Negative for gait problem or joint swelling.   Skin: Negative for rash.  Neurological: Negative for dizziness or headache.  No other specific complaints in a complete review of systems (except as listed in HPI above).  Objective  Vitals:   02/09/19 1021  BP: 118/78  Pulse: 89  Resp: 16  Temp: (!) 96.9 F (36.1 C)  TempSrc: Temporal  SpO2: 99%  Weight: 160 lb 1.6 oz (72.6 kg)  Height: 5' 5" (1.651 m)    Body mass index is 26.64 kg/m.  Physical Exam  Constitutional: Patient appears well-developed and well-nourished. Overweight. No distress.  HEENT: head atraumatic, normocephalic, pupils equal and reactive to light Cardiovascular: Normal rate, regular rhythm and normal heart sounds.  No murmur heard. No BLE edema. Pulmonary/Chest: Effort normal and breath sounds normal. No respiratory distress. Abdominal: Soft.  There is no tenderness. Psychiatric: Patient has a normal mood and affect. behavior is normal. Judgment and thought content normal.  Recent Results (from the past 2160 hour(s))  POCT HgB A1C     Status: Normal   Collection Time: 02/09/19 10:32 AM  Result Value Ref Range   Hemoglobin A1C     HbA1c POC (<> result, manual entry)     HbA1c, POC (prediabetic range)     HbA1c, POC (controlled diabetic range) 6.3 0.0 - 7.0 %     PHQ2/9: Depression screen [facility] 2/9 02/09/2019 02/09/2019 11/03/2018 07/30/2018 05/04/2018  Decreased Interest 0 0 0 0 0  Down, Depressed, Hopeless 0 0 0 0 0  PHQ - 2 Score 0 0 0 0 0  Altered sleeping - 3 0 3 0  Tired, decreased energy - 1 0 3 0  Change in appetite - 1 0 0 0  Feeling bad or failure about yourself  - 0 1 0 0  Trouble concentrating - 0 1 0 0  Moving slowly or fidgety/restless - 0 0 0 0  Suicidal thoughts - 0 0 0 0  PHQ-9 Score - _0 0  Difficult doing work/chores - Not difficult at all Somewhat difficult Not difficult at all Not difficult at all  Some  recent data might be hidden    phq 9 is negative   Fall Risk: Fall Risk  02/09/2019 11/03/2018 07/30/2018 05/04/2018  04/08/2018  Falls in the past year? 0 0 0 0 No  Number falls in past yr: 0 0 0 0 -  Injury with Fall? 0 0 0 0 -     Functional Status Survey: Is the patient deaf or have difficulty hearing?: No Does the patient have difficulty seeing, even when wearing glasses/contacts?: No Does the patient have difficulty concentrating, remembering, or making decisions?: Yes Does the patient have difficulty walking or climbing stairs?: No Does the patient have difficulty dressing or bathing?: No Does the patient have difficulty doing errands alone such as visiting a doctor's office or shopping?: No    Assessment & Plan  1. 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 History of alcoholism ([place])  Doing well   3. Essential (primary) hypertension  - COMPLETE METABOLIC PANEL WITH GFR - CBC with Differential/Platelet  4. Major depression in remission (HCC)  - DULoxetine (CYMBALTA) 60 MG capsule; Take 1 capsule (60 mg total) by mouth daily.  Dispense: 90 capsule; Refill: 1  5. Dyslipidemia  - Lipid panel - atorvastatin (LIPITOR) 80 MG tablet; Take 1 tablet (80 mg total) by mouth daily. New dose  Dispense: 90 tablet; Refill: 1  6. OSA (obstructive sleep apnea)  Not on CPAP , mild symptoms not needed   7. Vitamin D deficiency  - VITAMIN D 25 Hydroxy (Vit-D Deficiency, Fractures) - Vitamin D, Ergocalciferol, (DRISDOL) 1.25 MG (50000 UT) CAPS capsule; Take 1 capsule (50,000 Units total) by mouth every 7 (seven) days.  Dispense: 12 capsule; Refill: 1  8. Partial epilepsy with impairment of consciousness (HCC)  - VITAMIN D 25 Hydroxy (Vit-D Deficiency, Fractures) - Levetiracetam level - Lamotrigine level  9. Overactive bladder  Stable at this time  10. Perennial allergic rhinitis with seasonal variation  - fluticasone (FLONASE) 50 MCG/ACT nasal spray; Place 2 sprays into both nostrils daily.  Dispense: 42 g; Refill: 0 - montelukast  (SINGULAIR) 10 MG tablet; Take 1 tablet (10 mg total) by mouth daily.  Dispense: 90 tablet; Refill: 1 - loratadine (CLARITIN) 10 MG tablet; Take 1 tablet (10 mg total) by mouth 2 (two) times daily.  Dispense: 180 tablet; Refill: 1  11. Primary insomnia  - traZODone (DESYREL) 100 MG tablet; Take 1 tablet (100 mg total) by mouth at bedtime.  Dispense: 90 tablet; Refill: 1  12. Needs flu shot  - Flu Vaccine QUAD High Dose(Fluad)

## 2019-02-12 LAB — CBC WITH DIFFERENTIAL/PLATELET
Absolute Monocytes: 570 cells/uL (ref 200–950)
Basophils Absolute: 40 cells/uL (ref 0–200)
Basophils Relative: 0.7 %
Eosinophils Absolute: 131 cells/uL (ref 15–500)
Eosinophils Relative: 2.3 %
HCT: 47.9 % (ref 38.5–50.0)
Hemoglobin: 15.8 g/dL (ref 13.2–17.1)
Lymphs Abs: 1436 cells/uL (ref 850–3900)
MCH: 31.3 pg (ref 27.0–33.0)
MCHC: 33 g/dL (ref 32.0–36.0)
MCV: 95 fL (ref 80.0–100.0)
MPV: 8.9 fL (ref 7.5–12.5)
Monocytes Relative: 10 %
Neutro Abs: 3523 cells/uL (ref 1500–7800)
Neutrophils Relative %: 61.8 %
Platelets: 252 10*3/uL (ref 140–400)
RBC: 5.04 10*6/uL (ref 4.20–5.80)
RDW: 13.1 % (ref 11.0–15.0)
Total Lymphocyte: 25.2 %
WBC: 5.7 10*3/uL (ref 3.8–10.8)

## 2019-02-12 LAB — COMPLETE METABOLIC PANEL WITH GFR
AG Ratio: 1.7 (calc) (ref 1.0–2.5)
ALT: 47 U/L — ABNORMAL HIGH (ref 9–46)
AST: 24 U/L (ref 10–35)
Albumin: 4.5 g/dL (ref 3.6–5.1)
Alkaline phosphatase (APISO): 57 U/L (ref 35–144)
BUN: 18 mg/dL (ref 7–25)
CO2: 27 mmol/L (ref 20–32)
Calcium: 9.5 mg/dL (ref 8.6–10.3)
Chloride: 103 mmol/L (ref 98–110)
Creat: 0.98 mg/dL (ref 0.70–1.25)
GFR, Est African American: 93 mL/min/{1.73_m2} (ref >=60)
GFR, Est Non African American: 81 mL/min/{1.73_m2} (ref >=60)
Globulin: 2.7 g/dL (calc) (ref 1.9–3.7)
Glucose, Bld: 106 mg/dL — ABNORMAL HIGH (ref 65–99)
Potassium: 4.5 mmol/L (ref 3.5–5.3)
Sodium: 137 mmol/L (ref 135–146)
Total Bilirubin: 0.7 mg/dL (ref 0.2–1.2)
Total Protein: 7.2 g/dL (ref 6.1–8.1)

## 2019-02-12 LAB — LIPID PANEL
Cholesterol: 231 mg/dL — ABNORMAL HIGH (ref <200)
HDL: 65 mg/dL (ref >=40)
LDL Cholesterol (Calc): 125 mg/dL (calc) — ABNORMAL HIGH
Non-HDL Cholesterol (Calc): 166 mg/dL (calc) — ABNORMAL HIGH (ref <130)
Total CHOL/HDL Ratio: 3.6 (calc) (ref <5.0)
Triglycerides: 265 mg/dL — ABNORMAL HIGH (ref <150)

## 2019-02-12 LAB — VITAMIN D 25 HYDROXY (VIT D DEFICIENCY, FRACTURES): Vit D, 25-Hydroxy: 27 ng/mL — ABNORMAL LOW (ref 30–100)

## 2019-02-12 LAB — LAMOTRIGINE LEVEL: Lamotrigine Lvl: <0.5 ug/mL — ABNORMAL LOW (ref 4.0–18.0)

## 2019-02-12 LAB — LEVETIRACETAM LEVEL: Keppra (Levetiracetam): 2.5 ug/mL — ABNORMAL LOW (ref 12.0–46.0)

## 2019-04-26 ENCOUNTER — Other Ambulatory Visit: Payer: Self-pay | Admitting: Family Medicine

## 2019-04-26 DIAGNOSIS — F5101 Primary insomnia: Secondary | ICD-10-CM

## 2019-04-26 DIAGNOSIS — E785 Hyperlipidemia, unspecified: Secondary | ICD-10-CM

## 2019-04-26 NOTE — Telephone Encounter (Signed)
Need names of medications for refills.

## 2019-04-26 NOTE — Telephone Encounter (Signed)
Medication Refill - Medication: Needs BP/Cholesterol/Sleep Medicine   Has the patient contacted their pharmacy? Yes.   (Agent: If no, request that the patient contact the pharmacy for the refill.) (Agent: If yes, when and what did the pharmacy advise?)  Preferred Pharmacy (with phone number or street name):  [id number] - [place], [place] - [address]  [address] [place] [address]  Phone: [phone] Fax: [phone]     Agent: Please be advised that RX refills may take up to 3 business days. We ask that you follow-up with your pharmacy.

## 2019-04-27 NOTE — Telephone Encounter (Signed)
Not sure what BP medication he is on, but he needs a refill.

## 2019-04-28 MED ORDER — TRAZODONE HCL 100 MG PO TABS: 100.0000 mg | ORAL_TABLET | Freq: Every day | ORAL | 1 refills | Status: DC

## 2019-04-28 MED ORDER — ATORVASTATIN CALCIUM 80 MG PO TABS: 80.0000 mg | ORAL_TABLET | Freq: Every day | ORAL | 1 refills | Status: DC

## 2019-04-30 ENCOUNTER — Other Ambulatory Visit: Payer: Self-pay | Admitting: Family Medicine

## 2019-04-30 DIAGNOSIS — Z0000 Encounter for general adult medical examination without abnormal findings: Secondary | ICD-10-CM

## 2019-06-09 ENCOUNTER — Other Ambulatory Visit: Payer: Self-pay

## 2019-06-09 ENCOUNTER — Encounter: Payer: Self-pay | Admitting: Family Medicine

## 2019-06-09 ENCOUNTER — Ambulatory Visit (INDEPENDENT_AMBULATORY_CARE_PROVIDER_SITE_OTHER): Payer: Medicare Other | Admitting: Family Medicine

## 2019-06-09 VITALS — BP 124/86 | HR 96 | Temp 96.8°F | Resp 16 | Ht 65.0 in | Wt 155.7 lb

## 2019-06-09 DIAGNOSIS — E785 Hyperlipidemia, unspecified: Secondary | ICD-10-CM | POA: Diagnosis not present

## 2019-06-09 DIAGNOSIS — N521 Erectile dysfunction due to diseases classified elsewhere: Secondary | ICD-10-CM | POA: Diagnosis not present

## 2019-06-09 DIAGNOSIS — F325 Major depressive disorder, single episode, in full remission: Secondary | ICD-10-CM | POA: Diagnosis not present

## 2019-06-09 DIAGNOSIS — G4733 Obstructive sleep apnea (adult) (pediatric): Secondary | ICD-10-CM

## 2019-06-09 DIAGNOSIS — E1140 Type 2 diabetes mellitus with diabetic neuropathy, unspecified: Secondary | ICD-10-CM | POA: Diagnosis not present

## 2019-06-09 DIAGNOSIS — R21 Rash and other nonspecific skin eruption: Secondary | ICD-10-CM

## 2019-06-09 DIAGNOSIS — F1021 Alcohol dependence, in remission: Secondary | ICD-10-CM | POA: Diagnosis not present

## 2019-06-09 DIAGNOSIS — E559 Vitamin D deficiency, unspecified: Secondary | ICD-10-CM

## 2019-06-09 DIAGNOSIS — F5101 Primary insomnia: Secondary | ICD-10-CM

## 2019-06-09 DIAGNOSIS — N529 Male erectile dysfunction, unspecified: Secondary | ICD-10-CM

## 2019-06-09 DIAGNOSIS — G40209 Localization-related (focal) (partial) symptomatic epilepsy and epileptic syndromes with complex partial seizures, not intractable, without status epilepticus: Secondary | ICD-10-CM

## 2019-06-09 LAB — POCT GLYCOSYLATED HEMOGLOBIN (HGB A1C): Hemoglobin A1C: 6.1 % — AB (ref 4.0–5.6)

## 2019-06-09 MED ORDER — DOXYCYCLINE HYCLATE 100 MG PO TABS: 100.0000 mg | ORAL_TABLET | Freq: Two times a day (BID) | ORAL | 0 refills | Status: DC

## 2019-06-09 NOTE — Progress Notes (Signed)
Name: David Skinner   MRN: [id number]    DOB: 01-Nov-1950   Date:06/09/2019       Progress Note  Subjective  Chief Complaint  Chief Complaint  Patient presents with  . Rash  . Follow-up   We did not have a translator today, he said he did not need one, the phone did not work either.   HPI  Major depressionin remission:going on for years butworsein2018, he is on Cymbalta since April 2018, he states he is still working around the house and doing well, normal phq 9 today. He states he is feeling well today   Hyperlipidemia:he is taking Atorvastatin 80 mg dailyNo myalgia, no chest pain or decrease in exercise tolerance, however his LDL went up from 85 to 125 on his last visit, explained that he needs to take it daily   OSA/minimal/RLS: he sees Dr Manuella Ghazi for memory loss and had sleep study2018it showed minimal sleep apnea and RLS -seeing Dr. Manuella Ghazi.He states that when he takes Trazodone he sleeps well but if he skips a dose he has difficulty falling asleep.   History of hypertension:he is still getting up slowly to avoid getting dizzy, no chest pain or palpitation   History ofAlcoholism: he used to drink heavy while in [place] used to drink liquor at least 3 times a week and used to get drunk, when he moved to [place] ( 1979). He was still drinking better but quit in2017. Last ALT was elevated at 47 but he states not drinking at all.   Urinary frequency: seen by Urologistand states medication is working well for him now. Stable   Partial Seizure: Last episode 02/2016 and went to [facility],taking medication daily now and isseeing Dr. Manuella Ghazi. No recent episodes.His levels were low on his last visit but no problems and he states up to date with follow up with Dr. 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 He denies polyphagia,  polydipsia he has urinary frequency that is managed by Urologist   Rash: itchy rash on scalp , nuchal area and also on top of his head, bumpy, no pain, but is very itchy . It started about 2 months ago. Denies dandruff . He states they change shampoos a lot but not sure if that was the cause.    Patient Active Problem List   Diagnosis Date Noted  . OSA (obstructive sleep apnea) 12/09/2016  . RLS (restless legs syndrome) 12/09/2016  . Episode of moderate major depression ([place]) 12/09/2016  . History of alcoholism ([place]) 07/22/2016  . Cervical radiculitis 01/09/2016  . Lumbosacral radiculitis 01/09/2016  . Osteoarthritis of carpometacarpal (CMC) joint of thumb 01/09/2016  . Tinea cruris 03/21/2015  . 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 Depression with anxiety 11/28/2014  . Chronic constipation 11/28/2014  . Insomnia 11/28/2014  . Dyslipidemia 11/28/2014  . Fatty infiltration of liver 11/28/2014  . Essential (primary) hypertension 11/28/2014  . Gastric reflux 11/28/2014  . Memory loss or impairment 11/28/2014  . Perennial allergic rhinitis with seasonal variation 11/28/2014  . Vitamin D deficiency 11/28/2014  . Tenosynovitis of thumb 11/28/2014  . Partial epilepsy with impairment of consciousness ([place]) 11/16/2014    Past Surgical History:  Procedure Laterality Date  . COLONOSCOPY WITH PROPOFOL N/A 03/25/2017   Procedure: COLONOSCOPY WITH PROPOFOL;  Surgeon: Lin Landsman, MD;  Location: [facility];  Service: Endoscopy;  Laterality: N/A;  . ESOPHAGOGASTRODUODENOSCOPY N/A 03/25/2017   Procedure: ESOPHAGOGASTRODUODENOSCOPY (EGD);  Surgeon: Lin Landsman, MD;  Location: [facility];  Service: Endoscopy;  Laterality: N/A;  Diabetic - oral meds  . LASIK Bilateral     Family History  Problem Relation Age of Onset  . Diabetes Mother     Social History   Socioeconomic History  . Marital status: Married     Spouse name: [name]  . Number of children: 3  . Years of education: Not on file  . Highest education level: 12th grade  Occupational History  . Occupation: Retired   Tobacco Use  . Smoking status: Former Smoker    Packs/day: 1.00    Years: 15.00    Pack years: 15.00    Types: Cigarettes    Start date: 06/17/1984    Quit date: 11/28/1999    Years since quitting: 19.5  . Smokeless tobacco: Never Used  Substance and Sexual Activity  . Alcohol use: No    Alcohol/week: 0.0 standard drinks    Comment: used to drink a pack on weekends.   . Drug use: No  . Sexual activity: Yes    Partners: Female  Other Topics Concern  . Not on file  Social History Narrative   Originally from [place]    Social Determinants of Health   Financial Resource Strain:   . Difficulty of Paying Living Expenses: Not on file  Food Insecurity:   . Worried About Charity fundraiser in the Last Year: Not on file  . Ran Out of Food in the Last Year: Not on file  Transportation Needs:   . Lack of Transportation (Medical): Not on file  . Lack of Transportation (Non-Medical): Not on file  Physical Activity: Unknown  . Days of Exercise per Week: 5 days  . Minutes of Exercise per Session: Not on file  Stress:   . Feeling of Stress : Not on file  Social Connections:   . Frequency of Communication with Friends and Family: Not on file  . Frequency of Social Gatherings with Friends and Family: Not on file  . Attends Religious Services: Not on file  . Active Member of Clubs or Organizations: Not on file  . Attends Archivist Meetings: Not on file  . Marital Status: Not on file  Intimate Partner Violence:   . Fear of Current or Ex-Partner: Not on file  . Emotionally Abused: Not on file  . Physically Abused: Not on file  . Sexually Abused: Not on file     Current Outpatient Medications:  .  acetaminophen (TYLENOL) 325 MG tablet, Take 650 mg by mouth every 6 (six) hours as needed for moderate  pain., Disp: , Rfl:  .  aspirin EC 81 MG tablet, Take 1 tablet (81 mg total) by mouth daily., Disp: 30 tablet, Rfl: 0 .  atorvastatin (LIPITOR) 80 MG tablet, Take 1 tablet (80 mg total) by mouth daily. New dose, Disp: 90 tablet, Rfl: 1 .  Blood Glucose Monitoring Suppl (ACCU-CHEK AVIVA PLUS) w/Device KIT, 1 kit by Does not apply route daily., Disp: 1 kit, Rfl: 0 .  donepezil (ARICEPT) 5 MG tablet, Take by mouth., Disp: , Rfl:  .  DULoxetine (CYMBALTA) 60 MG capsule, Take 1 capsule (60 mg total) by mouth daily., Disp: 90 capsule, Rfl: 1 .  fluticasone (FLONASE) 50 MCG/ACT nasal spray, Place 2 sprays into both  nostrils daily., Disp: 42 g, Rfl: 0 .  glucose blood (ACCU-CHEK AVIVA PLUS) test strip, 1 each by Other route 2 (two) times daily as needed for other. c, Disp: 100 each, Rfl: 3 .  LamoTRIgine 100 MG TB24 24 hour tablet, Take by mouth., Disp: , Rfl:  .  Lancets (ACCU-CHEK SOFT TOUCH) lancets, 1 each by Other route 2 (two) times daily as needed for other. DMII, Disp: 100 each, Rfl: 3 .  loratadine (CLARITIN) 10 MG tablet, Take 1 tablet (10 mg total) by mouth 2 (two) times daily., Disp: 180 tablet, Rfl: 1 .  doxycycline (VIBRA-TABS) 100 MG tablet, Take 1 tablet (100 mg total) by mouth 2 (two) times daily., Disp: 14 tablet, Rfl: 0 .  levETIRAcetam (KEPPRA) 250 MG tablet, TK 1 T PO AT NIGHT, Disp: , Rfl:  .  montelukast (SINGULAIR) 10 MG tablet, Take 1 tablet (10 mg total) by mouth daily. (Patient not taking: Reported on 06/09/2019), Disp: 90 tablet, Rfl: 1 .  silodosin (RAPAFLO) 8 MG CAPS capsule, Take 1 capsule (8 mg total) by mouth daily with breakfast. (Patient not taking: Reported on 06/09/2019), Disp: 30 capsule, Rfl: 11 .  tadalafil (CIALIS) 5 MG tablet, Take 1 tablet (5 mg total) by mouth daily as needed for erectile dysfunction. (Patient not taking: Reported on 06/09/2019), Disp: 30 tablet, Rfl: 5 .  traZODone (DESYREL) 100 MG tablet, Take 1 tablet (100 mg total) by mouth at bedtime. (Patient  not taking: Reported on 06/09/2019), Disp: 90 tablet, Rfl: 1 .  Vitamin D, Ergocalciferol, (DRISDOL) 1.25 MG (50000 UT) CAPS capsule, Take 1 capsule (50,000 Units total) by mouth every 7 (seven) days. (Patient not taking: Reported on 06/09/2019), Disp: 12 capsule, Rfl: 1  Allergies  Allergen Reactions  . Ibuprofen Itching  . Levofloxacin   . Lisinopril Cough  . Metoprolol   . Nsaids Itching  . Tolmetin Itching  . Tramadol Hcl Itching    I personally reviewed active problem list, medication list, allergies, family history, social history, health maintenance with the patient/caregiver today.   ROS  Constitutional: Negative for fever or weight change.  Respiratory: Negative for cough and shortness of breath.   Cardiovascular: Negative for chest pain or palpitations.  Gastrointestinal: Negative for abdominal pain, no bowel changes.  Musculoskeletal: Negative for gait problem or joint swelling.  Skin: Negative for rash.  Neurological: Negative for dizziness or headache.  No other specific complaints in a complete review of systems (except as listed in HPI above).  Objective  Vitals:   06/09/19 0900  BP: 124/86  Pulse: 96  Resp: 16  Temp: (!) 96.8 F (36 C)  SpO2: 97%  Weight: 155 lb 11.2 oz (70.6 kg)  Height: '5\' 5"'$  (1.651 m)    Body mass index is 25.91 kg/m.  Physical Exam  Constitutional: Patient appears well-developed and well-nourished. No distress.  HEENT: head atraumatic, normocephalic, pupils equal and reactive to light Cardiovascular: Normal rate, regular rhythm and normal heart sounds.  No murmur heard. No BLE edema. Pulmonary/Chest: Effort normal and breath sounds normal. No respiratory distress. Abdominal: Soft.  There is no tenderness. Skin: patch of erythema and some pustules on nuchal area, also some irritation on top of scalp, no seborrhea noticed  Psychiatric: Patient has a normal mood and affect. behavior is normal. Judgment and thought content  normal.  PHQ2/9: Depression screen [facility] 2/9 06/09/2019 02/09/2019 02/09/2019 11/03/2018 07/30/2018  Decreased Interest 0 0 0 0 0  Down, Depressed, Hopeless 0 0 0 0 0  PHQ - 2 Score 0 0 0 0 0  Altered sleeping 0 - 3 0 3  Tired, decreased energy 1 - 1 0 3  Change in appetite 0 - 1 0 0  Feeling bad or failure about yourself  0 - 0 1 0  Trouble concentrating 0 - 0 1 0  Moving slowly or fidgety/restless 0 - 0 0 0  Suicidal thoughts 0 - 0 0 0  PHQ-9 Score 1 - '5 2 6  '$ Difficult doing work/chores Not difficult at all - Not difficult at all Somewhat difficult Not difficult at all  Some recent data might be hidden    phq 9 is negative  Fall Risk: Fall Risk  06/09/2019 02/09/2019 11/03/2018 07/30/2018 05/04/2018  Falls in the past year? 0 0 0 0 0  Number falls in past yr: 0 0 0 0 0  Injury with Fall? 0 0 0 0 0     Functional Status Survey: Is the patient deaf or have difficulty hearing?: Yes Does the patient have difficulty seeing, even when wearing glasses/contacts?: No Does the patient have difficulty concentrating, remembering, or making decisions?: Yes(Memory issues) Does the patient have difficulty walking or climbing stairs?: No Does the patient have difficulty dressing or bathing?: No Does the patient have difficulty doing errands alone such as visiting a doctor's office or shopping?: No    Assessment & Plan  1. 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 Major depression in remission ([place])  Continue medication  3. History of alcoholism ([place])  Doing well   4. Dyslipidemia  Advised to make sure he is taking medications daily   5. Partial epilepsy with impairment of consciousness (HCC)  No recent episodes  6. Vitamin D deficiency  Continue vitamin D supplementation   7. OSA (obstructive sleep apnea)  Did not qualify for CPAP  8. Primary insomnia  Take Trazodone every  night  9. ED (erectile dysfunction) of organic origin   10. Rash in adult  - doxycycline (VIBRA-TABS) 100 MG tablet; Take 1 tablet (100 mg total) by mouth 2 (two) times daily.  Dispense: 14 tablet; Refill: 0

## 2019-06-10 LAB — MICROALBUMIN / CREATININE URINE RATIO
Creatinine, Urine: 178 mg/dL (ref 20–320)
Microalb Creat Ratio: 7 mcg/mg creat (ref <30)
Microalb, Ur: 1.3 mg/dL

## 2019-06-15 ENCOUNTER — Ambulatory Visit: Payer: Medicare Other | Admitting: Family Medicine

## 2019-06-17 LAB — HM DIABETES EYE EXAM

## 2019-06-21 ENCOUNTER — Encounter: Payer: Self-pay | Admitting: Family Medicine

## 2019-07-01 DIAGNOSIS — G40219 Localization-related (focal) (partial) symptomatic epilepsy and epileptic syndromes with complex partial seizures, intractable, without status epilepticus: Secondary | ICD-10-CM | POA: Diagnosis not present

## 2019-07-05 ENCOUNTER — Other Ambulatory Visit: Payer: Self-pay | Admitting: Family Medicine

## 2019-07-05 DIAGNOSIS — Z87891 Personal history of nicotine dependence: Secondary | ICD-10-CM

## 2019-07-05 DIAGNOSIS — Z0000 Encounter for general adult medical examination without abnormal findings: Secondary | ICD-10-CM

## 2019-07-26 ENCOUNTER — Other Ambulatory Visit: Payer: Self-pay | Admitting: Family Medicine

## 2019-07-26 DIAGNOSIS — E559 Vitamin D deficiency, unspecified: Secondary | ICD-10-CM

## 2019-07-26 DIAGNOSIS — F5101 Primary insomnia: Secondary | ICD-10-CM

## 2019-07-30 DIAGNOSIS — Z23 Encounter for immunization: Secondary | ICD-10-CM | POA: Diagnosis not present

## 2019-08-04 ENCOUNTER — Telehealth: Payer: Self-pay

## 2019-08-04 NOTE — Telephone Encounter (Signed)
Copied from [place] [id number]. Topic: General - Other >> Aug 04, 2019 10:56 AM Yvette Rack wrote: Reason for CRM: Pt wife stated pt had an allergic reaction on his head and requests a refill of the medication. Pt wife stated she does not know the name of the medication but the doctor should know.

## 2019-08-06 NOTE — Telephone Encounter (Signed)
Call to patient- using interpreter: Teu # [phone] Patient advised of PCP recommendation. They are out of town and request appointment for Tuesday. Call to office- appointment scheduled for patient. Patient states he will go to UC if gets worse- otherwise will see in office Tuesday.

## 2019-08-06 NOTE — Telephone Encounter (Signed)
Called interpreter to talk to the patient and unable to get ahold of him. We don't know what kind of reaction he is having, unclear which medication they need topical or oral. If they call back let them know to go to Urgent Care please.

## 2019-08-10 ENCOUNTER — Other Ambulatory Visit: Payer: Self-pay

## 2019-08-10 ENCOUNTER — Encounter: Payer: Self-pay | Admitting: Family Medicine

## 2019-08-10 ENCOUNTER — Ambulatory Visit (INDEPENDENT_AMBULATORY_CARE_PROVIDER_SITE_OTHER): Payer: Medicare Other | Admitting: Family Medicine

## 2019-08-10 VITALS — BP 118/80 | HR 88 | Temp 97.7°F | Resp 18 | Ht 66.0 in | Wt 157.5 lb

## 2019-08-10 DIAGNOSIS — R21 Rash and other nonspecific skin eruption: Secondary | ICD-10-CM

## 2019-08-10 NOTE — Progress Notes (Signed)
Name: David Skinner   MRN: [id number]    DOB: 12-17-1950   Date:08/10/2019       Progress Note  Subjective  Chief Complaint  Chief Complaint  Patient presents with  . Cyst    on head, itching    HPI  Patient  came in with an interpreter  Rash: itchy rash on scalp , nuchal area and also on top of his head, bumpy, no pain, but is very itchy . It started about 3-4  months ago. Denies dandruff . We gave him Doxy and symptoms improved temporarily but is bothersome again, he came in because he wants it to be gone. We will refer him to Dermatologist   Patient Active Problem List   Diagnosis Date Noted  . OSA (obstructive sleep apnea) 12/09/2016  . RLS (restless legs syndrome) 12/09/2016  . Episode of moderate major depression ([place]) 12/09/2016  . History of alcoholism ([place]) 07/22/2016  . Cervical radiculitis 01/09/2016  . Lumbosacral radiculitis 01/09/2016  . Osteoarthritis of carpometacarpal (CMC) joint of thumb 01/09/2016  . Tinea cruris 03/21/2015  . 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 Depression with anxiety 11/28/2014  . Chronic constipation 11/28/2014  . Insomnia 11/28/2014  . Dyslipidemia 11/28/2014  . Fatty infiltration of liver 11/28/2014  . Essential (primary) hypertension 11/28/2014  . Gastric reflux 11/28/2014  . Memory loss or impairment 11/28/2014  . Perennial allergic rhinitis with seasonal variation 11/28/2014  . Vitamin D deficiency 11/28/2014  . Tenosynovitis of thumb 11/28/2014  . Partial epilepsy with impairment of consciousness ([place]) 11/16/2014    Social History   Tobacco Use  . Smoking status: Former Smoker    Packs/day: 1.00    Years: 15.00    Pack years: 15.00    Types: Cigarettes    Start date: 06/17/1984    Quit date: 11/28/1999    Years since quitting: 19.7  . Smokeless tobacco: Never Used  Substance Use Topics  . Alcohol use: No    Alcohol/week: 0.0 standard drinks    Comment: used to  drink a pack on weekends.   . Drug use: No     Current Outpatient Medications:  .  acetaminophen (TYLENOL) 325 MG tablet, Take 650 mg by mouth every 6 (six) hours as needed for moderate pain., Disp: , Rfl:  .  aspirin EC 81 MG tablet, Take 1 tablet (81 mg total) by mouth daily., Disp: 30 tablet, Rfl: 0 .  atorvastatin (LIPITOR) 80 MG tablet, Take 1 tablet (80 mg total) by mouth daily. New dose, Disp: 90 tablet, Rfl: 1 .  Blood Glucose Monitoring Suppl (ACCU-CHEK AVIVA PLUS) w/Device KIT, 1 kit by Does not apply route daily., Disp: 1 kit, Rfl: 0 .  donepezil (ARICEPT) 5 MG tablet, Take by mouth., Disp: , Rfl:  .  doxycycline (VIBRA-TABS) 100 MG tablet, Take 1 tablet (100 mg total) by mouth 2 (two) times daily., Disp: 14 tablet, Rfl: 0 .  DULoxetine (CYMBALTA) 60 MG capsule, Take 1 capsule (60 mg total) by mouth daily., Disp: 90 capsule, Rfl: 1 .  fluticasone (FLONASE) 50 MCG/ACT nasal spray, Place 2 sprays into both nostrils daily., Disp: 42 g, Rfl: 0 .  glucose blood (ACCU-CHEK AVIVA PLUS) test strip, 1 each by Other route 2 (two) times daily as needed for other. c, Disp: 100 each, Rfl: 3 .  LamoTRIgine 100 MG TB24 24 hour tablet, Take by mouth., Disp: , Rfl:  .  Lancets (ACCU-CHEK SOFT TOUCH) lancets, 1  each by Other route 2 (two) times daily as needed for other. DMII, Disp: 100 each, Rfl: 3 .  levETIRAcetam (KEPPRA) 250 MG tablet, TK 1 T PO AT NIGHT, Disp: , Rfl:  .  loratadine (CLARITIN) 10 MG tablet, Take 1 tablet (10 mg total) by mouth 2 (two) times daily., Disp: 180 tablet, Rfl: 1 .  traZODone (DESYREL) 100 MG tablet, TAKE 1 TABLET AT BEDTIME, Disp: 90 tablet, Rfl: 0 .  Vitamin D, Ergocalciferol, (DRISDOL) 1.25 MG (50000 UNIT) CAPS capsule, TAKE 1 CAPSULE EVERY 7 DAYS, Disp: 12 capsule, Rfl: 1 .  montelukast (SINGULAIR) 10 MG tablet, Take 1 tablet (10 mg total) by mouth daily. (Patient not taking: Reported on 06/09/2019), Disp: 90 tablet, Rfl: 1 .  silodosin (RAPAFLO) 8 MG CAPS capsule,  Take 1 capsule (8 mg total) by mouth daily with breakfast. (Patient not taking: Reported on 06/09/2019), Disp: 30 capsule, Rfl: 11 .  tadalafil (CIALIS) 5 MG tablet, Take 1 tablet (5 mg total) by mouth daily as needed for erectile dysfunction. (Patient not taking: Reported on 06/09/2019), Disp: 30 tablet, Rfl: 5  Allergies  Allergen Reactions  . Ibuprofen Itching  . Levofloxacin   . Lisinopril Cough  . Metoprolol   . Nsaids Itching  . Tolmetin Itching  . Tramadol Hcl Itching    ROS  Ten systems reviewed and is negative except as mentioned in HPI   Objective  Vitals:   08/10/19 1339  BP: 118/80  Pulse: 88  Resp: 18  Temp: 97.7 F (36.5 C)  TempSrc: Temporal  SpO2: 98%  Weight: 157 lb 8 oz (71.4 kg)  Height: _0  (1.676 m)    Body mass index is 25.42 kg/m.    Physical Exam  Constitutional: Patient appears well-developed and well-nourished. Overweight.  No distress.  HEENT: head atraumatic, normocephalic, pupils equal and reactive to light, Cardiovascular: Normal rate, regular rhythm and normal heart sounds.  No murmur heard. No BLE edema. Pulmonary/Chest: Effort normal and breath sounds normal. No respiratory distress. Abdominal: Soft.  There is no tenderness. Skin: different size lesions, some nodules and some flat lesion, erythematous and with some crusting  Psychiatric: Patient has a normal mood and affect. behavior is normal. Judgment and thought content normal.  Recent Results (from the past 2160 hour(s))  Microalbumin / creatinine urine ratio     Status: None   Collection Time: 06/09/19 12:00 AM  Result Value Ref Range   Creatinine, Urine 178 20 - 320 mg/dL   Microalb, Ur 1.3 mg/dL    Comment: Reference Range Not established    Microalb Creat Ratio 7 <30 mcg/mg creat    Comment: . The ADA defines abnormalities in albumin excretion as follows: [place] Kitchen Category         Result (mcg/mg creatinine) . Normal                    <30 Microalbuminuria          30-299  Clinical albuminuria   > OR = 300 . The ADA recommends that at least two of three specimens collected within a 3-6 month period be abnormal before considering a patient to be within a diagnostic category.   POCT HgB A1C     Status: Abnormal   Collection Time: 06/09/19  9:23 AM  Result Value Ref Range   Hemoglobin A1C 6.1 (A) 4.0 - 5.6 %   HbA1c POC (<> result, manual entry)     HbA1c, POC (prediabetic range)  HbA1c, POC (controlled diabetic range)    HM DIABETES EYE EXAM     Status: None   Collection Time: 06/17/19 12:00 AM  Result Value Ref Range   HM Diabetic Eye Exam No Retinopathy No Retinopathy    Comment: [facility], Dr. Edison Pace     Assessment & Plan  1. Rash in adult  - Ambulatory referral to Dermatology

## 2019-08-16 ENCOUNTER — Other Ambulatory Visit: Payer: Self-pay | Admitting: Family Medicine

## 2019-08-16 DIAGNOSIS — F325 Major depressive disorder, single episode, in full remission: Secondary | ICD-10-CM

## 2019-08-16 DIAGNOSIS — E785 Hyperlipidemia, unspecified: Secondary | ICD-10-CM

## 2019-08-27 DIAGNOSIS — Z23 Encounter for immunization: Secondary | ICD-10-CM | POA: Diagnosis not present

## 2019-08-27 IMAGING — CR DG ORBITS FOR FOREIGN BODY
1 series · 2 of 2 positions shown · non-contrast
Comparison: none

CLINICAL DATA: Metal screening prior to MRI

EXAM:
ORBITS FOR FOREIGN BODY - 2 VIEW

[Series 1: dg eye foreign body · 0.14mm/px · 2 of 2 slices shown]
[im 1/2]
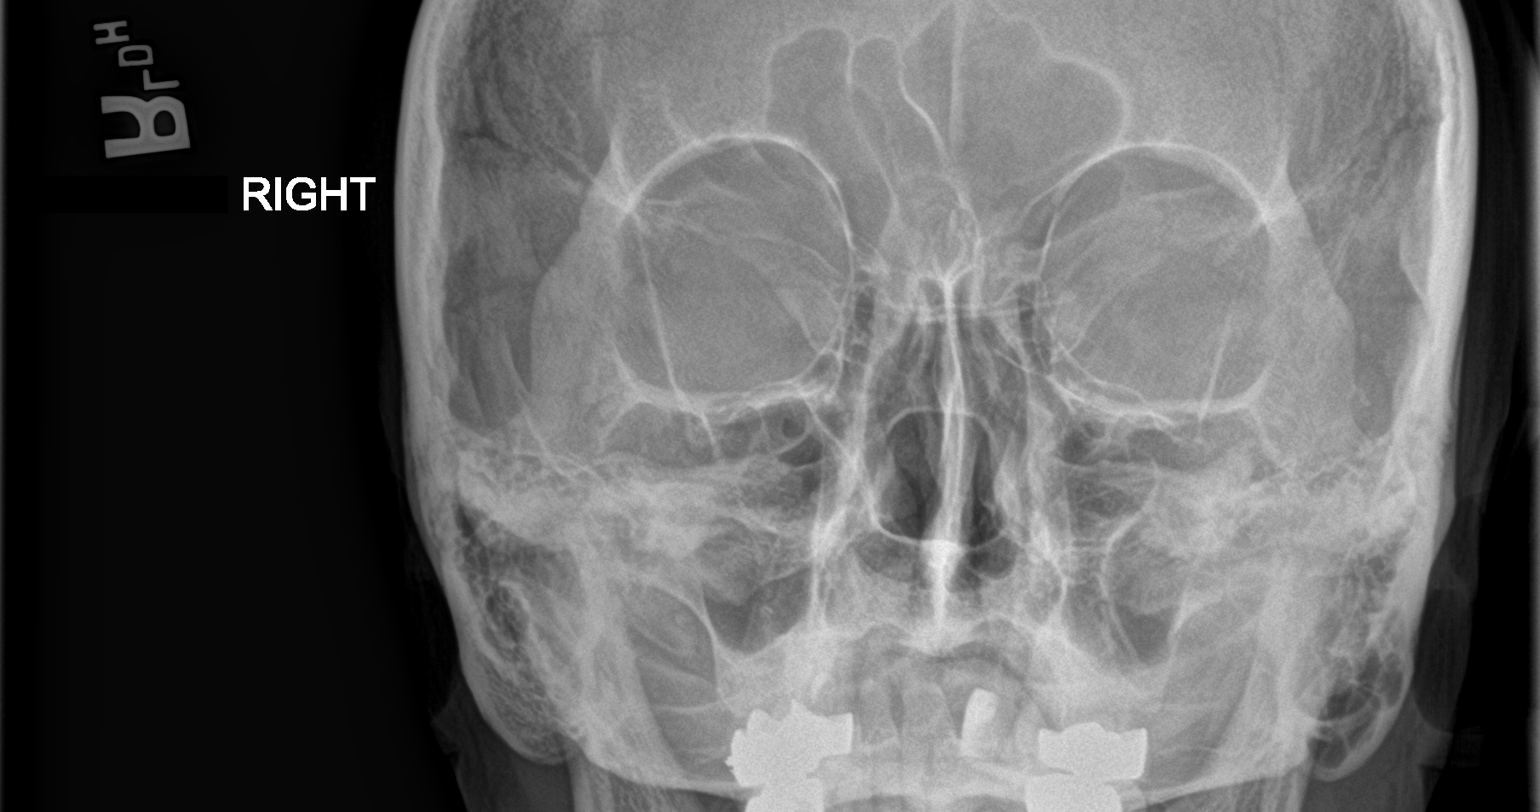
[im 2/2]
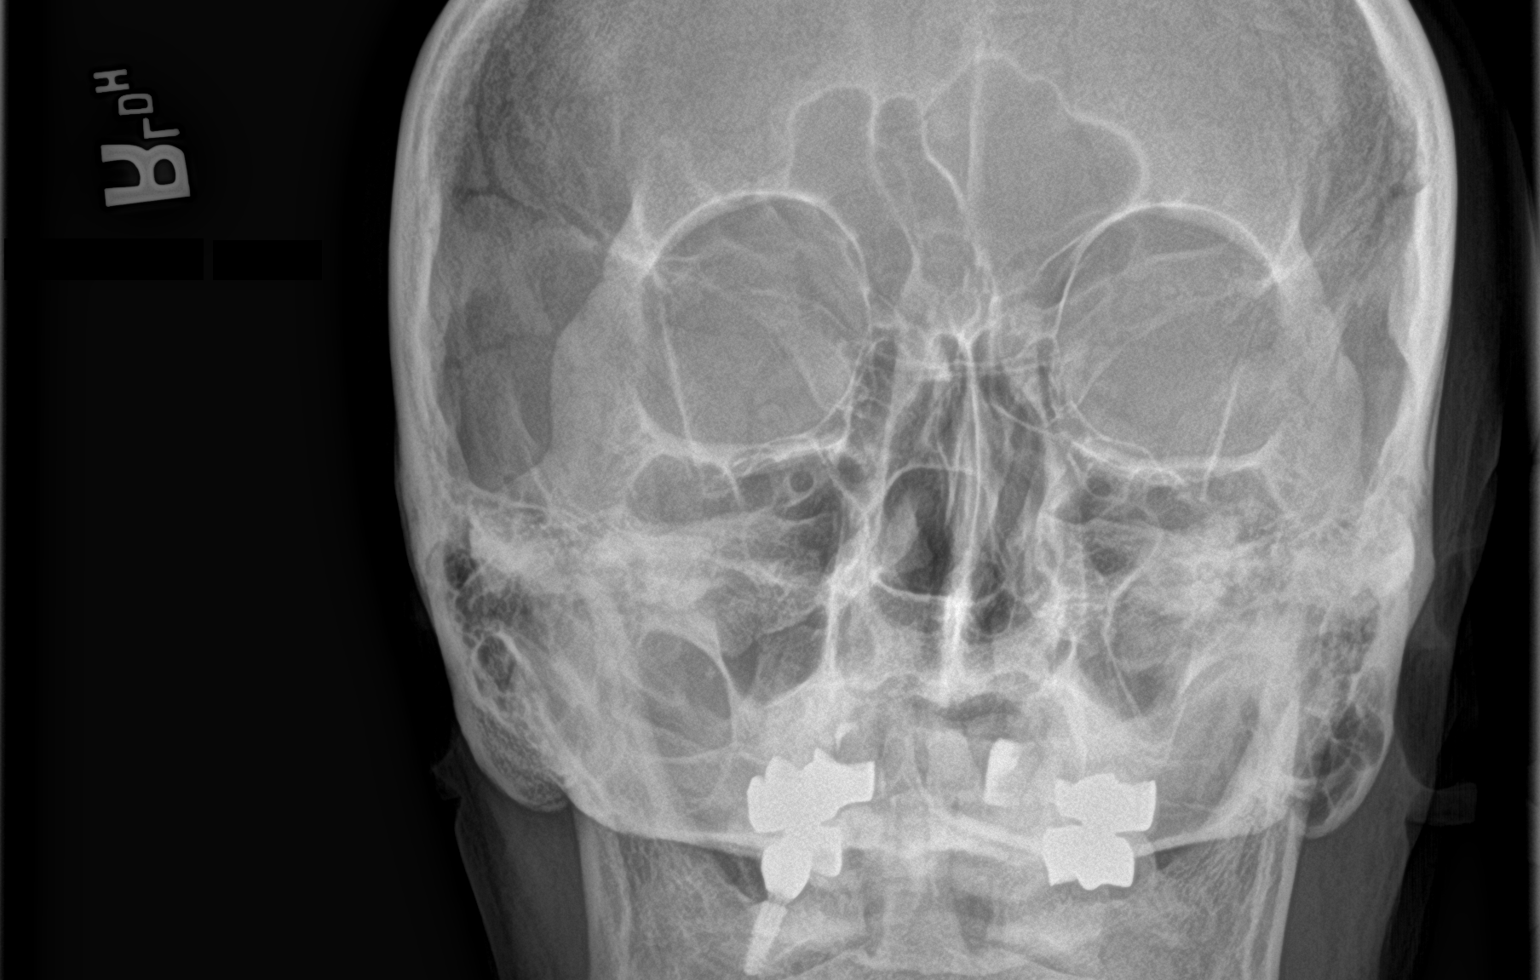

[2 of 2 positions shown; findings below may reference images not displayed]

FINDINGS: There is no evidence of metallic foreign body within the orbits. No
significant bone abnormality identified.
IMPRESSION: No evidence of metallic foreign body within the orbits.

## 2019-09-10 ENCOUNTER — Other Ambulatory Visit: Payer: Self-pay | Admitting: Family Medicine

## 2019-09-10 DIAGNOSIS — J302 Other seasonal allergic rhinitis: Secondary | ICD-10-CM

## 2019-10-03 NOTE — Progress Notes (Incomplete)
10/04/19 1:03 PM   Vivian Rockie Neighbours Kuennen [birth year] [id number]  Referring provider: [name], MD [address] [place],  [place] [address]  No chief complaint on file.   HPI: 69 year old male with history of lower urinary tract symptoms returns today for 1 year f/u.   -Remains on silodosin    PMH: Past Medical History:  Diagnosis Date  . Allergy   . Anxiety   . Atopy   . Cataract of right eye    [facility]  . Chronic constipation   . Elevated LFTs   . Fatty liver   . GERD (gastroesophageal reflux disease)   . Hyperlipidemia   . Hypertension   . Hypoglycemia   . Insomnia   . Memory change   . Overweight   . Paresthesia of hand   . Seizure ([place])    12/12, 09/27/14, 02/28/16  . Tenosynovitis of finger   . Tinea cruris   . Vitamin D deficiency     Surgical History: Past Surgical History:  Procedure Laterality Date  . COLONOSCOPY WITH PROPOFOL N/A 03/25/2017   Procedure: COLONOSCOPY WITH PROPOFOL;  Surgeon: Lin Landsman, MD;  Location: [place];  Service: Endoscopy;  Laterality: N/A;  . ESOPHAGOGASTRODUODENOSCOPY N/A 03/25/2017   Procedure: ESOPHAGOGASTRODUODENOSCOPY (EGD);  Surgeon: Lin Landsman, MD;  Location: [place];  Service: Endoscopy;  Laterality: N/A;  Diabetic - oral meds  . LASIK Bilateral     Home Medications:  Allergies as of 10/04/2019      Reactions   Ibuprofen Itching   Levofloxacin    Lisinopril Cough   Metoprolol    Nsaids Itching   Tolmetin Itching   Tramadol Hcl Itching      Medication List       Accurate as of October 03, 2019  1:03 PM. If you have any questions, ask your nurse or doctor.        Accu-Chek Aviva Plus w/Device Kit 1 kit by Does not apply route daily.   accu-chek soft touch lancets 1 each by Other route 2 (two) times daily as needed for other. DMII   acetaminophen 325 MG tablet Commonly known as: TYLENOL Take 650 mg by mouth every 6 (six) hours as needed  for moderate pain.   aspirin EC 81 MG tablet Take 1 tablet (81 mg total) by mouth daily.   atorvastatin 80 MG tablet Commonly known as: LIPITOR TAKE 1 TABLET BY MOUTH EVERY DAY   donepezil 5 MG tablet Commonly known as: ARICEPT Take by mouth.   DULoxetine 60 MG capsule Commonly known as: CYMBALTA TAKE 1 CAPSULE BY MOUTH EVERY DAY   fluticasone 50 MCG/ACT nasal spray Commonly known as: FLONASE Place 2 sprays into both nostrils daily.   glucose blood test strip Commonly known as: Accu-Chek Aviva Plus 1 each by Other route 2 (two) times daily as needed for other. c   LamoTRIgine 100 MG Tb24 24 hour tablet Take by mouth.   levETIRAcetam 250 MG tablet Commonly known as: KEPPRA TK 1 T PO AT NIGHT   loratadine 10 MG tablet Commonly known as: CLARITIN Take 1 tablet (10 mg total) by mouth 2 (two) times daily.   montelukast 10 MG tablet Commonly known as: SINGULAIR TAKE 1 TABLET DAILY   silodosin 8 MG Caps capsule Commonly known as: RAPAFLO Take 1 capsule (8 mg total) by mouth daily with breakfast.   tadalafil 5 MG tablet Commonly known as: Cialis Take 1 tablet (5 mg total) by  mouth daily as needed for erectile dysfunction.   traZODone 100 MG tablet Commonly known as: DESYREL TAKE 1 TABLET AT BEDTIME   Vitamin D (Ergocalciferol) 1.25 MG (50000 UNIT) Caps capsule Commonly known as: DRISDOL TAKE 1 CAPSULE EVERY 7 DAYS       Allergies:  Allergies  Allergen Reactions  . Ibuprofen Itching  . Levofloxacin   . Lisinopril Cough  . Metoprolol   . Nsaids Itching  . Tolmetin Itching  . Tramadol Hcl Itching    Family History: Family History  Problem Relation Age of Onset  . Diabetes Mother     Social History:  reports that he quit smoking about 19 years ago. His smoking use included cigarettes. He started smoking about 35 years ago. He has a 15.00 pack-year smoking history. He has never used smokeless tobacco. He reports that he does not drink alcohol or use  drugs.   Physical Exam: There were no vitals taken for this visit.  Constitutional:  Alert and oriented, No acute distress. HEENT: [place] AT, moist mucus membranes.  Trachea midline, no masses. Cardiovascular: No clubbing, cyanosis, or edema. Respiratory: Normal respiratory effort, no increased work of breathing. GI: Abdomen is soft, nontender, nondistended, no abdominal masses GU: No CVA tenderness Lymph: No cervical or inguinal lymphadenopathy. Skin: No rashes, bruises or suspicious lesions. Neurologic: Grossly intact, no focal deficits, moving all 4 extremities. Psychiatric: Normal mood and affect.  Laboratory Data:  Urinalysis  Pertinent Imaging: ***   Assessment & Plan:    _0 @  No follow-ups on file.  [facility] [address], [place],  [address] [phone]  I, Lucas Mallow, am acting as a scribe for Dr. Nicki Reaper C. Stoioff,  {Add Media planner

## 2019-10-04 ENCOUNTER — Ambulatory Visit: Payer: Self-pay | Admitting: Urology

## 2019-10-06 ENCOUNTER — Ambulatory Visit: Payer: Medicare Other | Admitting: Urology

## 2019-10-08 ENCOUNTER — Encounter: Payer: Self-pay | Admitting: Family Medicine

## 2019-10-08 ENCOUNTER — Ambulatory Visit (INDEPENDENT_AMBULATORY_CARE_PROVIDER_SITE_OTHER): Payer: Medicare Other | Admitting: Family Medicine

## 2019-10-08 DIAGNOSIS — N521 Erectile dysfunction due to diseases classified elsewhere: Secondary | ICD-10-CM | POA: Diagnosis not present

## 2019-10-08 DIAGNOSIS — Z87891 Personal history of nicotine dependence: Secondary | ICD-10-CM

## 2019-10-08 DIAGNOSIS — R05 Cough: Secondary | ICD-10-CM | POA: Diagnosis not present

## 2019-10-08 DIAGNOSIS — E1140 Type 2 diabetes mellitus with diabetic neuropathy, unspecified: Secondary | ICD-10-CM | POA: Diagnosis not present

## 2019-10-08 DIAGNOSIS — R058 Other specified cough: Secondary | ICD-10-CM

## 2019-10-08 MED ORDER — ALBUTEROL SULFATE HFA 108 (90 BASE) MCG/ACT IN AERS: 2.0000 | INHALATION_SPRAY | Freq: Four times a day (QID) | RESPIRATORY_TRACT | 0 refills | Status: DC | PRN

## 2019-10-08 MED ORDER — AZITHROMYCIN 250 MG PO TABS: ORAL_TABLET | ORAL | 0 refills | Status: DC

## 2019-10-08 MED ORDER — BENZONATATE 100 MG PO CAPS: 100.0000 mg | ORAL_CAPSULE | Freq: Two times a day (BID) | ORAL | 0 refills | Status: DC | PRN

## 2019-10-08 NOTE — Progress Notes (Signed)
Name: David Skinner   MRN: [id number]    DOB: [birth year]   Date:10/08/2019       Progress Note  Subjective  Chief Complaint  Chief Complaint  Patient presents with  . Diabetes    He has numbness in both of his hands and his feet is cold.  . Depression  . Cough    productive cough with mucous that is yellow.  . Fatigue  . Anorexia    I connected with  David Skinner on 10/08/19 at  8:00 AM EDT by telephone and verified that I am speaking with the correct person using two identifiers.  I discussed the limitations, risks, security and privacy concerns of performing an evaluation and management service by telephone and the availability of in person appointments. Staff also discussed with the patient that there may be a patient responsible charge related to this service. Patient Location: at home Provider Location: [facility]  Additional Individuals present: Interpreter David Skinner   HPI  The visit was remote, so we switched to acute visit and return for regular follow up  Cough: he has noticed a productive cough for the past two weeks, it is associated with chest pain while coughing, he also has some sob and wheezing.  He denies cold symptoms such as rhinorrhea, sore throat. He has fatigue, lack of appetite and chills. He denies lack of taste of smell. No nausea, vomiting or diarrhea. He used to smoke but quit many years ago. No sick contacts at home. Does not work outside the house. He had COVID-19 vaccine both doses March 13 th was the second vaccine.  Discussed getting CXR and labs or treat empirically first for CAP and he chose the later.   DMII: his glucose is not spiking even though he has been sick. He denies polyphagia, but has mild polydipsia and polyuria   Patient Active Problem List   Diagnosis Date Noted  . OSA (obstructive sleep apnea) 12/09/2016  . RLS (restless legs syndrome) 12/09/2016  . Episode of moderate major depression ([place]) 12/09/2016  . History of alcoholism  ([place]) 07/22/2016  . Cervical radiculitis 01/09/2016  . Lumbosacral radiculitis 01/09/2016  . Osteoarthritis of carpometacarpal (CMC) joint of thumb 01/09/2016  . Tinea cruris 03/21/2015  . 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 Depression with anxiety 11/28/2014  . Chronic constipation 11/28/2014  . Insomnia 11/28/2014  . Dyslipidemia 11/28/2014  . Fatty infiltration of liver 11/28/2014  . Essential (primary) hypertension 11/28/2014  . Gastric reflux 11/28/2014  . Memory loss or impairment 11/28/2014  . Perennial allergic rhinitis with seasonal variation 11/28/2014  . Vitamin D deficiency 11/28/2014  . Tenosynovitis of thumb 11/28/2014  . Partial epilepsy with impairment of consciousness ([place]) 11/16/2014    Past Surgical History:  Procedure Laterality Date  . COLONOSCOPY WITH PROPOFOL N/A 03/25/2017   Procedure: COLONOSCOPY WITH PROPOFOL;  Surgeon: David Landsman, MD;  Location: [place];  Service: Endoscopy;  Laterality: N/A;  . ESOPHAGOGASTRODUODENOSCOPY N/A 03/25/2017   Procedure: ESOPHAGOGASTRODUODENOSCOPY (EGD);  Surgeon: David Landsman, MD;  Location: [place];  Service: Endoscopy;  Laterality: N/A;  Diabetic - oral meds  . LASIK Bilateral     Family History  Problem Relation Age of Onset  . Diabetes Mother     Social History   Tobacco Use  . Smoking status: Former Smoker    Packs/day: 1.00    Years: 15.00    Pack years: 15.00  Types: Cigarettes    Start date: 06/17/1984    Quit date: 11/28/1999    Years since quitting: 19.8  . Smokeless tobacco: Never Used  Substance Use Topics  . Alcohol use: No    Alcohol/week: 0.0 standard drinks    Comment: used to drink a pack on weekends.     Current Outpatient Medications:  .  acetaminophen (TYLENOL) 325 MG tablet, Take 650 mg by mouth every 6 (six) hours as needed for moderate pain., Disp: , Rfl:  .  aspirin EC 81 MG tablet, Take 1  tablet (81 mg total) by mouth daily., Disp: 30 tablet, Rfl: 0 .  atorvastatin (LIPITOR) 80 MG tablet, TAKE 1 TABLET BY MOUTH EVERY DAY, Disp: 90 tablet, Rfl: 1 .  Blood Glucose Monitoring Suppl (ACCU-CHEK AVIVA PLUS) w/Device KIT, 1 kit by Does not apply route daily., Disp: 1 kit, Rfl: 0 .  donepezil (ARICEPT) 5 MG tablet, Take by mouth., Disp: , Rfl:  .  DULoxetine (CYMBALTA) 60 MG capsule, TAKE 1 CAPSULE BY MOUTH EVERY DAY, Disp: 90 capsule, Rfl: 1 .  fluticasone (FLONASE) 50 MCG/ACT nasal spray, Place 2 sprays into both nostrils daily., Disp: 42 g, Rfl: 0 .  glucose blood (ACCU-CHEK AVIVA PLUS) test strip, 1 each by Other route 2 (two) times daily as needed for other. c, Disp: 100 each, Rfl: 3 .  LamoTRIgine 100 MG TB24 24 hour tablet, Take by mouth., Disp: , Rfl:  .  Lancets (ACCU-CHEK SOFT TOUCH) lancets, 1 each by Other route 2 (two) times daily as needed for other. DMII, Disp: 100 each, Rfl: 3 .  levETIRAcetam (KEPPRA) 250 MG tablet, TK 1 T PO AT NIGHT, Disp: , Rfl:  .  loratadine (CLARITIN) 10 MG tablet, Take 1 tablet (10 mg total) by mouth 2 (two) times daily., Disp: 180 tablet, Rfl: 1 .  montelukast (SINGULAIR) 10 MG tablet, TAKE 1 TABLET DAILY, Disp: 90 tablet, Rfl: 1 .  traZODone (DESYREL) 100 MG tablet, TAKE 1 TABLET AT BEDTIME, Disp: 90 tablet, Rfl: 0 .  Vitamin D, Ergocalciferol, (DRISDOL) 1.25 MG (50000 UNIT) CAPS capsule, TAKE 1 CAPSULE EVERY 7 DAYS, Disp: 12 capsule, Rfl: 1 .  silodosin (RAPAFLO) 8 MG CAPS capsule, Take 1 capsule (8 mg total) by mouth daily with breakfast. (Patient not taking: Reported on 06/09/2019), Disp: 30 capsule, Rfl: 11 .  tadalafil (CIALIS) 5 MG tablet, Take 1 tablet (5 mg total) by mouth daily as needed for erectile dysfunction. (Patient not taking: Reported on 06/09/2019), Disp: 30 tablet, Rfl: 5  Allergies  Allergen Reactions  . Ibuprofen Itching  . Levofloxacin   . Lisinopril Cough  . Metoprolol   . Nsaids Itching  . Tolmetin Itching  . Tramadol  Hcl Itching    I personally reviewed active problem list, medication list, allergies, family history, social history, health maintenance with the patient/caregiver today.   ROS  Ten systems reviewed and is negative except as mentioned in HPI   Objective  Virtual encounter, vitals not obtained.  There is no height or weight on file to calculate BMI.  Physical Exam  Awake, alert and oriented  PHQ2/9: Depression screen [facility] 2/9 10/08/2019 08/10/2019 06/09/2019 02/09/2019 02/09/2019  Decreased Interest 0 0 0 0 0  Down, Depressed, Hopeless 0 0 0 0 0  PHQ - 2 Score 0 0 0 0 0  Altered sleeping 0 0 0 - 3  Tired, decreased energy 3 0 1 - 1  Change in appetite 3 0 0 - 1  Feeling bad or failure about yourself  0 0 0 - 0  Trouble concentrating 0 0 0 - 0  Moving slowly or fidgety/restless 0 0 0 - 0  Suicidal thoughts 0 0 0 - 0  PHQ-9 Score 6 0 1 - 5  Difficult doing work/chores Not difficult at all Not difficult at all Not difficult at all - Not difficult at all  Some recent data might be hidden   PHQ-2/9 Result is negative.    Fall Risk: Fall Risk  10/08/2019 08/10/2019 06/09/2019 02/09/2019 11/03/2018  Falls in the past year? 0 0 0 0 0  Number falls in past yr: 0 0 0 0 0  Injury with Fall? 0 0 0 0 0  Follow up - Falls evaluation completed - - -     Assessment & Plan  1. Productive cough  Call back if no improvement for labs and also CXR or go to EC  - benzonatate (TESSALON) 100 MG capsule; Take 1-2 capsules (100-200 mg total) by mouth 2 (two) times daily as needed.  Dispense: 40 capsule; Refill: 0 - albuterol (VENTOLIN HFA) 108 (90 Base) MCG/ACT inhaler; Inhale 2 puffs into the lungs every 6 (six) hours as needed for wheezing or shortness of breath.  Dispense: 18 g; Refill: 0 - azithromycin (ZITHROMAX) 250 MG tablet; Take as directed  Dispense: 6 tablet; Refill: 0  2. 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 The patient was provided an opportunity to ask questions and all were answered. The patient agreed with the plan and demonstrated an understanding of the instructions.   The patient was advised to call back or seek an in-person evaluation if the symptoms worsen or if the condition fails to improve as anticipated.  I provided 25  minutes of non-face-to-face time during this encounter.  Loistine Chance, MD

## 2019-10-11 ENCOUNTER — Ambulatory Visit: Payer: No Typology Code available for payment source | Admitting: Dermatology

## 2019-10-13 ENCOUNTER — Other Ambulatory Visit: Payer: Self-pay | Admitting: Family Medicine

## 2019-10-13 DIAGNOSIS — F5101 Primary insomnia: Secondary | ICD-10-CM

## 2019-10-21 ENCOUNTER — Encounter: Payer: Self-pay | Admitting: Urology

## 2019-10-21 ENCOUNTER — Other Ambulatory Visit: Payer: Self-pay

## 2019-10-21 ENCOUNTER — Ambulatory Visit (INDEPENDENT_AMBULATORY_CARE_PROVIDER_SITE_OTHER): Payer: Medicare Other | Admitting: Urology

## 2019-10-21 VITALS — BP 140/94 | HR 85 | Ht 65.0 in | Wt 160.0 lb

## 2019-10-21 DIAGNOSIS — N138 Other obstructive and reflux uropathy: Secondary | ICD-10-CM | POA: Diagnosis not present

## 2019-10-21 DIAGNOSIS — R399 Unspecified symptoms and signs involving the genitourinary system: Secondary | ICD-10-CM

## 2019-10-21 DIAGNOSIS — N401 Enlarged prostate with lower urinary tract symptoms: Secondary | ICD-10-CM

## 2019-10-21 MED ORDER — SILODOSIN 8 MG PO CAPS: 8.0000 mg | ORAL_CAPSULE | Freq: Every day | ORAL | 3 refills | Status: DC

## 2019-10-21 MED ORDER — SOLIFENACIN SUCCINATE 5 MG PO TABS: 5.0000 mg | ORAL_TABLET | Freq: Every day | ORAL | 3 refills | Status: DC

## 2019-10-21 NOTE — Progress Notes (Signed)
10/21/2019 1:29 PM   David Skinner 08/05/1950 [id number]  Referring provider: [name], MD [address] [place],   [address]  Chief Complaint  Patient presents with  . Follow-up   Urologic history: 1.  BPH with LUTS - History severe LUTS previously on Myrbetriq and tamsulosin without improvement -Marked improvement on trial silodosin  HPI: 69 y.o. male presents for annual follow-up of lower urinary tract symptoms.  -After 3+ months on silodosin he noted worsening LUTS with frequency, urgency with occasional episodes of urge incontinence and variable nocturia from 2-6 -No dysuria or gross hematuria -No flank, abdominal or pelvic pain -Last PSA 2018 0.4  PMH: Past Medical History:  Diagnosis Date  . Allergy   . Anxiety   . Atopy   . Cataract of right eye    [facility]  . Chronic constipation   . Elevated LFTs   . Fatty liver   . GERD (gastroesophageal reflux disease)   . Hyperlipidemia   . Hypertension   . Hypoglycemia   . Insomnia   . Memory change   . Overweight   . Paresthesia of hand   . Seizure ([place])    12/12, 09/27/14, 02/28/16  . Tenosynovitis of finger   . Tinea cruris   . Vitamin D deficiency     Surgical History: Past Surgical History:  Procedure Laterality Date  . COLONOSCOPY WITH PROPOFOL N/A 03/25/2017   Procedure: COLONOSCOPY WITH PROPOFOL;  Surgeon: Lin Landsman, MD;  Location: [place];  Service: Endoscopy;  Laterality: N/A;  . ESOPHAGOGASTRODUODENOSCOPY N/A 03/25/2017   Procedure: ESOPHAGOGASTRODUODENOSCOPY (EGD);  Surgeon: Lin Landsman, MD;  Location: [place];  Service: Endoscopy;  Laterality: N/A;  Diabetic - oral meds  . LASIK Bilateral     Home Medications:  Allergies as of 10/21/2019      Reactions   Ibuprofen Itching   Levofloxacin    Lisinopril Cough   Metoprolol    Nsaids Itching   Tolmetin Itching   Tramadol Hcl Itching      Medication List       Accurate as of Oct 21, 2019  1:29 PM. If you have any questions, ask your nurse or doctor.        Accu-Chek Aviva Plus w/Device Kit 1 kit by Does not apply route daily.   accu-chek soft touch lancets 1 each by Other route 2 (two) times daily as needed for other. DMII   acetaminophen 325 MG tablet Commonly known as: TYLENOL Take 650 mg by mouth every 6 (six) hours as needed for moderate pain.   albuterol 108 (90 Base) MCG/ACT inhaler Commonly known as: VENTOLIN HFA Inhale 2 puffs into the lungs every 6 (six) hours as needed for wheezing or shortness of breath.   aspirin EC 81 MG tablet Take 1 tablet (81 mg total) by mouth daily.   atorvastatin 80 MG tablet Commonly known as: LIPITOR TAKE 1 TABLET BY MOUTH EVERY DAY   azithromycin 250 MG tablet Commonly known as: ZITHROMAX Take as directed   benzonatate 100 MG capsule Commonly known as: TESSALON Take 1-2 capsules (100-200 mg total) by mouth 2 (two) times daily as needed.   donepezil 5 MG tablet Commonly known as: ARICEPT Take by mouth.   DULoxetine 60 MG capsule Commonly known as: CYMBALTA TAKE 1 CAPSULE BY MOUTH EVERY DAY   fluticasone 50 MCG/ACT nasal spray Commonly known as: FLONASE Place 2 sprays into both nostrils daily.   glucose blood test  strip Commonly known as: Accu-Chek Aviva Plus 1 each by Other route 2 (two) times daily as needed for other. c   LamoTRIgine 100 MG Tb24 24 hour tablet Take by mouth.   levETIRAcetam 250 MG tablet Commonly known as: KEPPRA TK 1 T PO AT NIGHT   loratadine 10 MG tablet Commonly known as: CLARITIN Take 1 tablet (10 mg total) by mouth 2 (two) times daily.   montelukast 10 MG tablet Commonly known as: SINGULAIR TAKE 1 TABLET DAILY   silodosin 8 MG Caps capsule Commonly known as: RAPAFLO Take 1 capsule (8 mg total) by mouth daily with breakfast.   tadalafil 5 MG tablet Commonly known as: Cialis Take 1 tablet (5 mg total) by mouth daily as needed for erectile  dysfunction.   traZODone 100 MG tablet Commonly known as: DESYREL TAKE 1 TABLET AT BEDTIME   Vitamin D (Ergocalciferol) 1.25 MG (50000 UNIT) Caps capsule Commonly known as: DRISDOL TAKE 1 CAPSULE EVERY 7 DAYS       Allergies:  Allergies  Allergen Reactions  . Ibuprofen Itching  . Levofloxacin   . Lisinopril Cough  . Metoprolol   . Nsaids Itching  . Tolmetin Itching  . Tramadol Hcl Itching    Family History: Family History  Problem Relation Age of Onset  . Diabetes Mother     Social History:  reports that he quit smoking about 19 years ago. His smoking use included cigarettes. He started smoking about 35 years ago. He has a 15.00 pack-year smoking history. He has never used smokeless tobacco. He reports that he does not drink alcohol or use drugs.   Physical Exam: BP (!) 140/94   Pulse 85   Ht _0  (1.651 m)   Wt 160 lb (72.6 kg)   BMI 26.63 kg/m   Constitutional:  Alert and oriented, No acute distress. HEENT: [place] AT, moist mucus membranes.  Trachea midline, no masses. Cardiovascular: No clubbing, cyanosis, or edema. Respiratory: Normal respiratory effort, no increased work of breathing. GI: Abdomen is soft, nontender, nondistended, no abdominal masses GU: Prostate 40 g, smooth without nodules Lymph: No cervical or inguinal lymphadenopathy. Skin: No rashes, bruises or suspicious lesions. Neurologic: Grossly intact, no focal deficits, moving all 4 extremities. Psychiatric: Normal mood and affect.   Assessment & Plan:    - BPH with lower urinary tract symptoms Worsening storage related voiding symptoms times several months.  Recommend continuing silodosin which was refilled.  Add solifenacin 5 mg daily.  Instructed to call in 1 month regarding medication efficacy.  Continue annual follow-up and earlier visit for persistent bothersome LUTS  PSA today   Abbie Sons, MD  [place] [address] [place], [place]  [address] [phone]

## 2019-10-22 ENCOUNTER — Telehealth: Payer: Self-pay

## 2019-10-22 ENCOUNTER — Other Ambulatory Visit: Payer: Self-pay | Admitting: *Deleted

## 2019-10-22 ENCOUNTER — Telehealth: Payer: Self-pay | Admitting: Urology

## 2019-10-22 DIAGNOSIS — R399 Unspecified symptoms and signs involving the genitourinary system: Secondary | ICD-10-CM

## 2019-10-22 LAB — PSA: Prostate Specific Ag, Serum: 0.6 ng/mL (ref 0.0–4.0)

## 2019-10-22 MED ORDER — SILODOSIN 8 MG PO CAPS: 8.0000 mg | ORAL_CAPSULE | Freq: Every day | ORAL | 3 refills | Status: DC

## 2019-10-22 MED ORDER — SOLIFENACIN SUCCINATE 5 MG PO TABS: 5.0000 mg | ORAL_TABLET | Freq: Every day | ORAL | 3 refills | Status: DC

## 2019-10-22 NOTE — Telephone Encounter (Signed)
-----   Message from Chrystie Nose, [place] sent at 10/22/2019  8:03 AM EDT -----  ----- Message ----- From: Abbie Sons, MD Sent: 10/22/2019   8:02 AM EDT To: Chrystie Nose, CMA  PSA stable 0.6

## 2019-10-22 NOTE — Telephone Encounter (Signed)
Interpreter services used. ID ID:[id number]. LMOM. Notified patient of stable PSA 0.6 as advised.

## 2019-10-22 NOTE — Telephone Encounter (Signed)
Pt asks that the two Rx that was sent to [facility] yesterday to be sent to [facility] in [place] instead. Please advise. Thank you.

## 2019-10-22 NOTE — Telephone Encounter (Signed)
Sent rx to [facility]

## 2019-10-22 NOTE — Telephone Encounter (Signed)
Pt daughter called looking for clarification on patients appointment. Gave nina information and lab results. Let her know medications were sent to [facility]. Patient daughter understands patients treatment

## 2019-10-26 ENCOUNTER — Other Ambulatory Visit: Payer: Self-pay | Admitting: Family Medicine

## 2019-10-26 DIAGNOSIS — R058 Other specified cough: Secondary | ICD-10-CM

## 2019-10-26 DIAGNOSIS — R05 Cough: Secondary | ICD-10-CM

## 2019-10-26 NOTE — Telephone Encounter (Signed)
Requested medications are due for refill today? Yes - Early for the refill   Requested medications are on active medication list?  Yes  Last Refill:  10/08/2019 18 grams with no refills   Future visit scheduled?  Yes in two weeks.    Notes to Clinic:  Patient had virtual visit on 10/08/2019 for productive cough and diabetes.  He was prescribed Azithromycin, albuterol inhaler, and Benzonatate.  He has an appointment in two weeks.  May need a phone call to see if he needs to be seen earlier since he is requesting albuterol inhaler early.  Thank you.

## 2019-11-09 ENCOUNTER — Encounter: Payer: Self-pay | Admitting: Family Medicine

## 2019-11-09 ENCOUNTER — Ambulatory Visit (INDEPENDENT_AMBULATORY_CARE_PROVIDER_SITE_OTHER): Payer: Medicare Other | Admitting: Family Medicine

## 2019-11-09 ENCOUNTER — Other Ambulatory Visit: Payer: Self-pay

## 2019-11-09 VITALS — BP 136/100 | HR 92 | Temp 98.1°F | Resp 14 | Ht 66.0 in | Wt 156.7 lb

## 2019-11-09 DIAGNOSIS — F1021 Alcohol dependence, in remission: Secondary | ICD-10-CM

## 2019-11-09 DIAGNOSIS — N521 Erectile dysfunction due to diseases classified elsewhere: Secondary | ICD-10-CM | POA: Diagnosis not present

## 2019-11-09 DIAGNOSIS — I10 Essential (primary) hypertension: Secondary | ICD-10-CM

## 2019-11-09 DIAGNOSIS — R072 Precordial pain: Secondary | ICD-10-CM

## 2019-11-09 DIAGNOSIS — E785 Hyperlipidemia, unspecified: Secondary | ICD-10-CM | POA: Diagnosis not present

## 2019-11-09 DIAGNOSIS — J302 Other seasonal allergic rhinitis: Secondary | ICD-10-CM

## 2019-11-09 DIAGNOSIS — J3089 Other allergic rhinitis: Secondary | ICD-10-CM

## 2019-11-09 DIAGNOSIS — E1140 Type 2 diabetes mellitus with diabetic neuropathy, unspecified: Secondary | ICD-10-CM

## 2019-11-09 DIAGNOSIS — F325 Major depressive disorder, single episode, in full remission: Secondary | ICD-10-CM

## 2019-11-09 DIAGNOSIS — R6889 Other general symptoms and signs: Secondary | ICD-10-CM

## 2019-11-09 DIAGNOSIS — Z87891 Personal history of nicotine dependence: Secondary | ICD-10-CM | POA: Diagnosis not present

## 2019-11-09 DIAGNOSIS — F5101 Primary insomnia: Secondary | ICD-10-CM

## 2019-11-09 DIAGNOSIS — G4733 Obstructive sleep apnea (adult) (pediatric): Secondary | ICD-10-CM

## 2019-11-09 DIAGNOSIS — R0602 Shortness of breath: Secondary | ICD-10-CM

## 2019-11-09 DIAGNOSIS — G40209 Localization-related (focal) (partial) symptomatic epilepsy and epileptic syndromes with complex partial seizures, not intractable, without status epilepticus: Secondary | ICD-10-CM

## 2019-11-09 LAB — POCT GLYCOSYLATED HEMOGLOBIN (HGB A1C): Hemoglobin A1C: 6.1 % — AB (ref 4.0–5.6)

## 2019-11-09 MED ORDER — FLUTICASONE PROPIONATE 50 MCG/ACT NA SUSP: 2.0000 | Freq: Every day | NASAL | 0 refills | Status: DC

## 2019-11-09 MED ORDER — TRAZODONE HCL 100 MG PO TABS: 100.0000 mg | ORAL_TABLET | Freq: Every day | ORAL | 0 refills | Status: DC

## 2019-11-09 MED ORDER — LOSARTAN POTASSIUM 50 MG PO TABS: 50.0000 mg | ORAL_TABLET | Freq: Every day | ORAL | 0 refills | Status: DC

## 2019-11-09 MED ORDER — OLOPATADINE HCL 0.1 % OP SOLN: 1.0000 [drp] | Freq: Two times a day (BID) | OPHTHALMIC | 2 refills | Status: DC

## 2019-11-09 NOTE — Progress Notes (Signed)
Name: David Skinner   MRN: [id number]    DOB: [birth year]   Date:11/09/2019       Progress Note  Subjective  Chief Complaint  Chief Complaint  Patient presents with  . Follow-up    1 month  . Depression  . Diabetes  . Hyperlipidemia  . Insomnia    HPI  Major depressionin remission:going on for years butworsein2018, he is on Cymbalta since April 2018, he stateshe is still working around the house and doing well, normal phq 9 today. Continue medication   Hyperlipidemia:he is taking Atorvastatin 80 mg dailyNo myalgia, he has noticed substernal chest pain, only during activity, associated with sob with activity, it resolves with rest within a couple of minutes. Not associated with  diaphoresis , nausea or radiation of pain. BP has also been elevated over the past couple of months, we will refer him to cardiologist   OSA/minimal/RLS: he sees Dr Manuella Ghazi for memory loss and had sleep study2018it showed minimal sleep apnea and RLS -seeing Dr. Manuella Ghazi.He states that when he takes Trazodone he sleeps well but if he skips a dose he has difficulty falling asleep, but controlled with medication .   History of hypertension: he has been off medication, but bp has been up over the past two months, we will resume losartan and refer to cardiologist   History ofAlcoholism: he used to drink heavy while in [place] used to drink liquor at least 3 times a week and used to get drunk, when he moved to [place] ( 1979). He was still drinking better but quit in2017. Last ALT was elevated at 47 but he states not drinking at all. We will recheck labs   Urinary frequency: seen by Urologistand states medication is working well for him now. Unchanged   Partial Seizure: Last episode 02/2016 and went to [facility],taking medication daily now and isseeing Dr. Manuella Ghazi. No recent episodes.   Diabetes Type 2 diet controlled with ED, hgbA1C hadgone up to 6.9% and we started Metformin back in 10/2015. A1C today is 6.1  %, she has episodes of polyphagia, polydipsia and polyuria, he has also noticed episodes of shakes like sugar is dropping , discussed importance of low sugar diet . He denies polyphagia, polydipsia. Continues statin therapy. Needs to follow a diabetic diet   AR: he has noticed some itchy arms when working outside, no rashes, also has itchy nose, nasal congestion and intermittent dry cough, we will resume nasal spray, continue loratadine and refill nasal steroid   Patient Active Problem List   Diagnosis Date Noted  . OSA (obstructive sleep apnea) 12/09/2016  . RLS (restless legs syndrome) 12/09/2016  . Episode of moderate major depression ([place]) 12/09/2016  . History of alcoholism ([place]) 07/22/2016  . Cervical radiculitis 01/09/2016  . Lumbosacral radiculitis 01/09/2016  . Osteoarthritis of carpometacarpal (CMC) joint of thumb 01/09/2016  . Tinea cruris 03/21/2015  . 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 Depression with anxiety 11/28/2014  . Chronic constipation 11/28/2014  . Insomnia 11/28/2014  . Dyslipidemia 11/28/2014  . Fatty infiltration of liver 11/28/2014  . Essential (primary) hypertension 11/28/2014  . Gastric reflux 11/28/2014  . Memory loss or impairment 11/28/2014  . Perennial allergic rhinitis with seasonal variation 11/28/2014  . Vitamin D deficiency 11/28/2014  . Tenosynovitis of thumb 11/28/2014  . Partial epilepsy with impairment of consciousness ([place]) 11/16/2014    Past Surgical History:  Procedure Laterality Date  . COLONOSCOPY WITH PROPOFOL N/A 03/25/2017  Procedure: COLONOSCOPY WITH PROPOFOL;  Surgeon: Lin Landsman, MD;  Location: [facility];  Service: Endoscopy;  Laterality: N/A;  . ESOPHAGOGASTRODUODENOSCOPY N/A 03/25/2017   Procedure: ESOPHAGOGASTRODUODENOSCOPY (EGD);  Surgeon: Lin Landsman, MD;  Location: [facility];  Service: Endoscopy;  Laterality: N/A;  Diabetic - oral meds   . LASIK Bilateral     Family History  Problem Relation Age of Onset  . Diabetes Mother     Social History   Tobacco Use  . Smoking status: Former Smoker    Packs/day: 1.00    Years: 15.00    Pack years: 15.00    Types: Cigarettes    Start date: 06/17/1984    Quit date: 11/28/1999    Years since quitting: 19.9  . Smokeless tobacco: Never Used  Substance Use Topics  . Alcohol use: No    Alcohol/week: 0.0 standard drinks    Comment: used to drink a pack on weekends.      Current Outpatient Medications:  .  albuterol (VENTOLIN HFA) 108 (90 Base) MCG/ACT inhaler, TAKE 2 PUFFS BY MOUTH EVERY 6 HOURS AS NEEDED FOR WHEEZE OR SHORTNESS OF BREATH, Disp: 18 g, Rfl: 0 .  acetaminophen (TYLENOL) 325 MG tablet, Take 650 mg by mouth every 6 (six) hours as needed for moderate pain., Disp: , Rfl:  .  aspirin EC 81 MG tablet, Take 1 tablet (81 mg total) by mouth daily., Disp: 30 tablet, Rfl: 0 .  atorvastatin (LIPITOR) 80 MG tablet, TAKE 1 TABLET BY MOUTH EVERY DAY, Disp: 90 tablet, Rfl: 1 .  Blood Glucose Monitoring Suppl (ACCU-CHEK AVIVA PLUS) w/Device KIT, 1 kit by Does not apply route daily., Disp: 1 kit, Rfl: 0 .  donepezil (ARICEPT) 5 MG tablet, Take by mouth., Disp: , Rfl:  .  DULoxetine (CYMBALTA) 60 MG capsule, TAKE 1 CAPSULE BY MOUTH EVERY DAY, Disp: 90 capsule, Rfl: 1 .  fluticasone (FLONASE) 50 MCG/ACT nasal spray, Place 2 sprays into both nostrils daily., Disp: 48 g, Rfl: 0 .  glucose blood (ACCU-CHEK AVIVA PLUS) test strip, 1 each by Other route 2 (two) times daily as needed for other. c, Disp: 100 each, Rfl: 3 .  LamoTRIgine 100 MG TB24 24 hour tablet, Take by mouth., Disp: , Rfl:  .  Lancets (ACCU-CHEK SOFT TOUCH) lancets, 1 each by Other route 2 (two) times daily as needed for other. DMII, Disp: 100 each, Rfl: 3 .  levETIRAcetam (KEPPRA) 250 MG tablet, TK 1 T PO AT NIGHT, Disp: , Rfl:  .  loratadine (CLARITIN) 10 MG tablet, Take 1 tablet (10 mg total) by mouth 2 (two) times  daily., Disp: 180 tablet, Rfl: 1 .  losartan (COZAAR) 50 MG tablet, Take 1 tablet (50 mg total) by mouth daily., Disp: 90 tablet, Rfl: 0 .  montelukast (SINGULAIR) 10 MG tablet, TAKE 1 TABLET DAILY, Disp: 90 tablet, Rfl: 1 .  olopatadine (PATANOL) 0.1 % ophthalmic solution, Place 1 drop into both eyes 2 (two) times daily., Disp: 5 mL, Rfl: 2 .  silodosin (RAPAFLO) 8 MG CAPS capsule, Take 1 capsule (8 mg total) by mouth daily with breakfast., Disp: 90 capsule, Rfl: 3 .  solifenacin (VESICARE) 5 MG tablet, Take 1 tablet (5 mg total) by mouth daily., Disp: 30 tablet, Rfl: 3 .  tadalafil (CIALIS) 5 MG tablet, Take 1 tablet (5 mg total) by mouth daily as needed for erectile dysfunction., Disp: 30 tablet, Rfl: 5 .  traZODone (DESYREL) 100 MG tablet, Take 1 tablet (100 mg  total) by mouth at bedtime., Disp: 90 tablet, Rfl: 0 .  Vitamin D, Ergocalciferol, (DRISDOL) 1.25 MG (50000 UNIT) CAPS capsule, TAKE 1 CAPSULE EVERY 7 DAYS, Disp: 12 capsule, Rfl: 1  Allergies  Allergen Reactions  . Ibuprofen Itching  . Levofloxacin   . Lisinopril Cough  . Metoprolol   . Nsaids Itching  . Tolmetin Itching  . Tramadol Hcl Itching    I personally reviewed active problem list, medication list, allergies, family history, social history, health maintenance with the patient/caregiver today.   ROS  Constitutional: Negative for fever or weight change.  Respiratory:  Positive for cough and shortness of breath.   Cardiovascular: Positive  for chest pain but no palpitations.  Gastrointestinal: Negative for abdominal pain, no bowel changes.  Musculoskeletal: Negative for gait problem or joint swelling.  Skin: Negative for rash.  Neurological: Negative for dizziness or headache.  No other specific complaints in a complete review of systems (except as listed in HPI above).   Objective  Vitals:   11/09/19 0944 11/09/19 0956  BP: (!) 142/100 (!) 136/100  Pulse: 92   Resp: 14   Temp: 98.1 F (36.7 C)   TempSrc:  Temporal   SpO2: 96%   Weight: 156 lb 11.2 oz (71.1 kg)   Height: '5\' 6"'$  (1.676 m)     Body mass index is 25.29 kg/m.  Physical Exam  Constitutional: Patient appears well-developed and well-nourished. Overweight  No distress.  HEENT: head atraumatic, normocephalic, pupils equal and reactive to light,neck supple Cardiovascular: Normal rate, regular rhythm and normal heart sounds.  No murmur heard. No BLE edema. Pulmonary/Chest: Effort normal and breath sounds normal. No respiratory distress. Abdominal: Soft.  There is no tenderness. Skin: no active rashes  Psychiatric: Patient has a normal mood and affect. behavior is normal. Judgment and thought content normal.  Recent Results (from the past 2160 hour(s))  PSA     Status: None   Collection Time: 10/21/19  2:29 PM  Result Value Ref Range   Prostate Specific Ag, Serum 0.6 0.0 - 4.0 ng/mL    Comment: Roche ECLIA methodology. According to the American Urological Association, Serum PSA should decrease and remain at undetectable levels after radical prostatectomy. The AUA defines biochemical recurrence as an initial PSA value 0.2 ng/mL or greater followed by a subsequent confirmatory PSA value 0.2 ng/mL or greater. Values obtained with different assay methods or kits cannot be used interchangeably. Results cannot be interpreted as absolute evidence of the presence or absence of malignant disease.   POCT HgB A1C     Status: Abnormal   Collection Time: 11/09/19 10:08 AM  Result Value Ref Range   Hemoglobin A1C 6.1 (A) 4.0 - 5.6 %   HbA1c POC (<> result, manual entry)     HbA1c, POC (prediabetic range)     HbA1c, POC (controlled diabetic range)      Diabetic Foot Exam: Diabetic Foot Exam - Simple   Simple Foot Form Diabetic Foot exam was performed with the following findings: Yes 11/09/2019 10:38 AM  Visual Inspection No deformities, no ulcerations, no other skin breakdown bilaterally: Yes Sensation Testing Intact to touch and  monofilament testing bilaterally: Yes Pulse Check Posterior Tibialis and Dorsalis pulse intact bilaterally: Yes Comments      PHQ2/9: Depression screen [facility] 2/9 11/09/2019 10/08/2019 08/10/2019 06/09/2019 02/09/2019  Decreased Interest 0 0 0 0 0  Down, Depressed, Hopeless 0 0 0 0 0  PHQ - 2 Score 0 0 0 0 0  Altered sleeping  1 0 0 0 -  Tired, decreased energy 0 3 0 1 -  Change in appetite 0 3 0 0 -  Feeling bad or failure about yourself  0 0 0 0 -  Trouble concentrating 0 0 0 0 -  Moving slowly or fidgety/restless 0 0 0 0 -  Suicidal thoughts 0 0 0 0 -  PHQ-9 Score 1 6 0 1 -  Difficult doing work/chores Not difficult at all Not difficult at all Not difficult at all Not difficult at all -  Some recent data might be hidden    phq 9 is negative   Fall Risk: Fall Risk  11/09/2019 10/08/2019 08/10/2019 06/09/2019 02/09/2019  Falls in the past year? 0 0 0 0 0  Number falls in past yr: - 0 0 0 0  Injury with Fall? - 0 0 0 0  Follow up - - Falls evaluation completed - -    Functional Status Survey: Is the patient deaf or have difficulty hearing?: Yes(both) Does the patient have difficulty seeing, even when wearing glasses/contacts?: No Does the patient have difficulty concentrating, remembering, or making decisions?: No Does the patient have difficulty dressing or bathing?: No Does the patient have difficulty doing errands alone such as visiting a doctor's office or shopping?: No   Assessment & Plan   1. 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 History of alcoholism ([place])  Doing well   3. Dyslipidemia  On statin therapy   4. History of tobacco use  Still off tobacco   5. Major depression in remission ([place])  Continue medication   6. Partial epilepsy with impairment of consciousness ([place])  Keep follow up with Dr. Manuella Ghazi   7. OSA (obstructive sleep apnea)  Did not qualify for cpap   8. Essential (primary)  hypertension  - Ambulatory referral to Cardiology - losartan (COZAAR) 50 MG tablet; Take 1 tablet (50 mg total) by mouth daily.  Dispense: 90 tablet; Refill: 0  9. Precordial pain  - Ambulatory referral to Cardiology  10. SOB (shortness of breath) on exertion  - Ambulatory referral to Cardiology  11. Itchy eyes  - olopatadine (PATANOL) 0.1 % ophthalmic solution; Place 1 drop into both eyes 2 (two) times daily.  Dispense: 5 mL; Refill: 2  12. Primary insomnia  - traZODone (DESYREL) 100 MG tablet; Take 1 tablet (100 mg total) by mouth at bedtime.  Dispense: 90 tablet; Refill: 0  13. Perennial allergic rhinitis with seasonal variation  - olopatadine (PATANOL) 0.1 % ophthalmic solution; Place 1 drop into both eyes 2 (two) times daily.  Dispense: 5 mL; Refill: 2 - fluticasone (FLONASE) 50 MCG/ACT nasal spray; Place 2 sprays into both nostrils daily.  Dispense: 48 g; Refill: 0

## 2019-11-10 ENCOUNTER — Ambulatory Visit (INDEPENDENT_AMBULATORY_CARE_PROVIDER_SITE_OTHER): Payer: Medicare Other | Admitting: Dermatology

## 2019-11-10 DIAGNOSIS — L739 Follicular disorder, unspecified: Secondary | ICD-10-CM

## 2019-11-10 MED ORDER — DOXYCYCLINE HYCLATE 50 MG PO CAPS: 50.0000 mg | ORAL_CAPSULE | Freq: Every day | ORAL | 2 refills | Status: DC

## 2019-11-10 MED ORDER — KETOCONAZOLE 2 % EX SHAM: MEDICATED_SHAMPOO | CUTANEOUS | 0 refills | Status: DC

## 2019-11-10 NOTE — Progress Notes (Signed)
   New Patient Visit  Subjective  David Skinner is a 69 y.o. male who presents for the following: Rash (Pt c/o itchy scalp x 6 months, pt using Dove shampoo with a poor response ).  The following portions of the chart were reviewed this encounter and updated as appropriate:  Tobacco  Allergies  Meds  Problems  Med Hx  Surg Hx  Fam Hx      Review of Systems:  No other skin or systemic complaints except as noted in HPI or Assessment and Plan.  Objective  Well appearing patient in no apparent distress; mood and affect are within normal limits.  A focused examination was performed including scalp . Relevant physical exam findings are noted in the Assessment and Plan.  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 IMarye Round, CMA, am acting as scribe for Sarina Ser, MD . I, Marye Round, CMA, am acting as scribe for Sarina Ser, MD .  Documentation: I have reviewed the above documentation for accuracy and completeness, and I agree with the above.  Sarina Ser, MD

## 2019-11-11 ENCOUNTER — Ambulatory Visit (INDEPENDENT_AMBULATORY_CARE_PROVIDER_SITE_OTHER): Payer: Medicare Other | Admitting: Cardiology

## 2019-11-11 ENCOUNTER — Other Ambulatory Visit: Payer: Self-pay

## 2019-11-11 ENCOUNTER — Encounter: Payer: Self-pay | Admitting: Cardiology

## 2019-11-11 VITALS — BP 110/90 | HR 90 | Ht 65.0 in | Wt 155.0 lb

## 2019-11-11 DIAGNOSIS — R0609 Other forms of dyspnea: Secondary | ICD-10-CM

## 2019-11-11 DIAGNOSIS — R072 Precordial pain: Secondary | ICD-10-CM

## 2019-11-11 DIAGNOSIS — R0602 Shortness of breath: Secondary | ICD-10-CM | POA: Diagnosis not present

## 2019-11-11 DIAGNOSIS — R079 Chest pain, unspecified: Secondary | ICD-10-CM

## 2019-11-11 DIAGNOSIS — R0600 Dyspnea, unspecified: Secondary | ICD-10-CM | POA: Diagnosis not present

## 2019-11-11 DIAGNOSIS — I10 Essential (primary) hypertension: Secondary | ICD-10-CM | POA: Diagnosis not present

## 2019-11-11 MED ORDER — METOPROLOL TARTRATE 100 MG PO TABS
ORAL_TABLET | ORAL | 0 refills | Status: DC
Start: 2019-11-11 — End: 2019-11-11

## 2019-11-11 NOTE — Patient Instructions (Addendum)
Medication Instructions:  No Changes to your daily medications. Please see instructions for your pre-procedure medication changes.   *If you need a refill on your cardiac medications before your next appointment, please call your pharmacy*   Lab Work: None Ordered If you have labs (blood work) drawn today and your tests are completely normal, you will receive your results only by: Marland Kitchen MyChart Message (if you have MyChart) OR . A paper copy in the mail If you have any lab test that is abnormal or we need to change your treatment, we will call you to review the results.  Testing/Procedures:  Your physician has requested that you have an echocardiogram. Echocardiography is a painless test that uses sound waves to create images of your heart. It provides your doctor with information about the size and shape of your heart and how well your heart's chambers and valves are working. This procedure takes approximately one hour. There are no restrictions for this procedure.  [place]  Your caregiver has ordered a Stress Test with nuclear imaging. The purpose of this test is to evaluate the blood supply to your heart muscle. This procedure is referred to as a "Non-Invasive Stress Test." This is because other than having an IV started in your vein, nothing is inserted or "invades" your body. Cardiac stress tests are done to find areas of poor blood flow to the heart by determining the extent of coronary artery disease (CAD). Some patients exercise on a treadmill, which naturally increases the blood flow to your heart, while others who are  unable to walk on a treadmill due to physical limitations have a pharmacologic/chemical stress agent called Lexiscan . This medicine will mimic walking on a treadmill by temporarily increasing your coronary blood flow.   Please note: these test may take anywhere between 2-4 hours to complete  PLEASE REPORT TO [facility] AT THE FIRST  DESK WILL DIRECT YOU WHERE TO GO  Date of Procedure:_____________________________________  Arrival Time for Procedure:______________________________    PLEASE NOTIFY THE OFFICE AT LEAST 24 HOURS IN ADVANCE IF YOU ARE UNABLE TO KEEP YOUR APPOINTMENT.  [phone] AND  PLEASE NOTIFY NUCLEAR MEDICINE AT [facility] AT LEAST 24 HOURS IN ADVANCE IF YOU ARE UNABLE TO KEEP YOUR APPOINTMENT. [phone]  How to prepare for your Myoview test:  1. Do not eat or drink after midnight 2. No caffeine for 24 hours prior to test 3. No smoking 24 hours prior to test. 4. Your medication may be taken with water.  If your doctor stopped a medication because of this test, do not take that medication. 5. Ladies, please do not wear dresses.  Skirts or pants are appropriate. Please wear a short sleeve shirt. 6. No perfume, cologne or lotion. 7. Wear comfortable walking shoes.           Follow-Up: At [facility], you and your health needs are our priority.  As part of our continuing mission to provide you with exceptional heart care, we have created designated Provider Care Teams.  These Care Teams include your primary Cardiologist (physician) and Advanced Practice Providers (APPs -  Physician Assistants and Nurse Practitioners) who all work together to provide you with the care you need, when you need it.  We recommend signing up for the patient portal called "MyChart".  Sign up information is provided on this After Visit Summary.  MyChart is used to connect with patients for Virtual Visits (Telemedicine).  Patients are able to view  lab/test results, encounter notes, upcoming appointments, etc.  Non-urgent messages can be sent to your provider as well.   To learn more about what you can do with MyChart, go to [email or web address].    Your next appointment:   Follow up after testing.   The format for your next appointment:   In Person  Provider:   Kate Sable, MD

## 2019-11-11 NOTE — Progress Notes (Signed)
Cardiology Office Note:    Date:  11/11/2019   ID:  Sota, Hetz 03/20/1951, MRN [id number]  PCP:  [name], MD  Cardiologist:  Kate Sable, MD  Electrophysiologist:  None   Referring MD: [name], MD   Chief Complaint  Patient presents with  . New Patient (Initial Visit)    Referred by Dr. Ancil Boozer for chest pain, DOE, and hypertension; Meds verbally reviewed with patient.    History of Present Illness:   [place] interpreter used for this encounter.  David Skinner is a 69 y.o. male with a hx of hypertension, hyperlipidemia, former smoker x10 to 15 years who presents due to chest pain and shortness of breath.  Patient states having symptoms of shortness of breath over the past 3 months associated with exertion.  Symptoms are resolved with rest.  He also complains of chest pain, dull in nature, which he rates as a 4 out of 10 in severity lasting a couple of minutes, typically occur when he exerts himself such as walking fast or working around the house.  Symptoms of chest pain resolved with rest.  He denies edema, orthopnea, palpitations.  Takes his medications as prescribed.  Past Medical History:  Diagnosis Date  . Allergy   . Anxiety   . Atopy   . Cataract of right eye    [facility]  . Chronic constipation   . Elevated LFTs   . Fatty liver   . GERD (gastroesophageal reflux disease)   . Hyperlipidemia   . Hypertension   . Hypoglycemia   . Insomnia   . Memory change   . Overweight   . Paresthesia of hand   . Seizure ([place])    12/12, 09/27/14, 02/28/16  . Tenosynovitis of finger   . Tinea cruris   . Vitamin D deficiency     Past Surgical History:  Procedure Laterality Date  . COLONOSCOPY WITH PROPOFOL N/A 03/25/2017   Procedure: COLONOSCOPY WITH PROPOFOL;  Surgeon: Lin Landsman, MD;  Location: [place];  Service: Endoscopy;  Laterality: N/A;  . ESOPHAGOGASTRODUODENOSCOPY N/A 03/25/2017   Procedure:  ESOPHAGOGASTRODUODENOSCOPY (EGD);  Surgeon: Lin Landsman, MD;  Location: [place];  Service: Endoscopy;  Laterality: N/A;  Diabetic - oral meds  . LASIK Bilateral     Current Medications: Current Meds  Medication Sig  . acetaminophen (TYLENOL) 325 MG tablet Take 650 mg by mouth every 6 (six) hours as needed for moderate pain.  Marland Kitchen albuterol (VENTOLIN HFA) 108 (90 Base) MCG/ACT inhaler TAKE 2 PUFFS BY MOUTH EVERY 6 HOURS AS NEEDED FOR WHEEZE OR SHORTNESS OF BREATH  . aspirin EC 81 MG tablet Take 1 tablet (81 mg total) by mouth daily.  Marland Kitchen atorvastatin (LIPITOR) 80 MG tablet TAKE 1 TABLET BY MOUTH EVERY DAY  . Blood Glucose Monitoring Suppl (ACCU-CHEK AVIVA PLUS) w/Device KIT 1 kit by Does not apply route daily.  Marland Kitchen donepezil (ARICEPT) 5 MG tablet Take 5 mg by mouth at bedtime.   Marland Kitchen doxycycline (VIBRAMYCIN) 50 MG capsule Take 1 capsule (50 mg total) by mouth daily.  . DULoxetine (CYMBALTA) 60 MG capsule TAKE 1 CAPSULE BY MOUTH EVERY DAY  . fluticasone (FLONASE) 50 MCG/ACT nasal spray Place 2 sprays into both nostrils daily.  Marland Kitchen glucose blood (ACCU-CHEK AVIVA PLUS) test strip 1 each by Other route 2 (two) times daily as needed for other. c  . ketoconazole (NIZORAL) 2 % shampoo Shampoo scalp 3 times a week  . LamoTRIgine 100  MG TB24 24 hour tablet Take by mouth.  . Lancets (ACCU-CHEK SOFT TOUCH) lancets 1 each by Other route 2 (two) times daily as needed for other. DMII  . levETIRAcetam (KEPPRA) 250 MG tablet TK 1 T PO AT NIGHT  . loratadine (CLARITIN) 10 MG tablet Take 1 tablet (10 mg total) by mouth 2 (two) times daily.  Marland Kitchen losartan (COZAAR) 50 MG tablet Take 1 tablet (50 mg total) by mouth daily.  . montelukast (SINGULAIR) 10 MG tablet TAKE 1 TABLET DAILY  . olopatadine (PATANOL) 0.1 % ophthalmic solution Place 1 drop into both eyes 2 (two) times daily.  . silodosin (RAPAFLO) 8 MG CAPS capsule Take 1 capsule (8 mg total) by mouth daily with breakfast.  . solifenacin (VESICARE) 5 MG  tablet Take 1 tablet (5 mg total) by mouth daily.  . tadalafil (CIALIS) 5 MG tablet Take 1 tablet (5 mg total) by mouth daily as needed for erectile dysfunction.  . traZODone (DESYREL) 100 MG tablet Take 1 tablet (100 mg total) by mouth at bedtime.  . Vitamin D, Ergocalciferol, (DRISDOL) 1.25 MG (50000 UNIT) CAPS capsule TAKE 1 CAPSULE EVERY 7 DAYS     Allergies:   Ibuprofen, Levofloxacin, Lisinopril, Metoprolol, Nsaids, Tolmetin, and Tramadol hcl   Social History   Socioeconomic History  . Marital status: Married    Spouse name: [name]  . Number of children: 3  . Years of education: Not on file  . Highest education level: 12th grade  Occupational History  . Occupation: Retired   Tobacco Use  . Smoking status: Former Smoker    Packs/day: 1.00    Years: 15.00    Pack years: 15.00    Types: Cigarettes    Start date: 06/17/1984    Quit date: 11/28/1999    Years since quitting: 19.9  . Smokeless tobacco: Never Used  Substance and Sexual Activity  . Alcohol use: Yes    Alcohol/week: 0.0 standard drinks    Comment: occassional beer  . Drug use: No  . Sexual activity: Yes    Partners: Female  Other Topics Concern  . Not on file  Social History Narrative   Originally from [place]    Social Determinants of Health   Financial Resource Strain:   . Difficulty of Paying Living Expenses:   Food Insecurity:   . Worried About Charity fundraiser in the Last Year:   . Arboriculturist in the Last Year:   Transportation Needs:   . Film/video editor (Medical):   Marland Kitchen Lack of Transportation (Non-Medical):   Physical Activity:   . Days of Exercise per Week:   . Minutes of Exercise per Session:   Stress:   . Feeling of Stress :   Social Connections:   . Frequency of Communication with Friends and Family:   . Frequency of Social Gatherings with Friends and Family:   . Attends Religious Services:   . Active Member of Clubs or Organizations:   . Attends Archivist  Meetings:   Marland Kitchen Marital Status:      Family History: The patient's family history includes Diabetes in his mother; Heart disease in his mother.  ROS:   Please see the history of present illness.     All other systems reviewed and are negative.  EKGs/Labs/Other Studies Reviewed:    The following studies were reviewed today:   EKG:  EKG is  ordered today.  The ekg ordered today demonstrates sinus rhythm, left axis deviation  Recent Labs: 02/09/2019: ALT 47; BUN 18; Creat 0.98; Hemoglobin 15.8; Platelets 252; Potassium 4.5; Sodium 137  Recent Lipid Panel    Component Value Date/Time   CHOL 231 (H) 02/09/2019 0000   CHOL 183 03/08/2015 1215   TRIG 265 (H) 02/09/2019 0000   HDL 65 02/09/2019 0000   HDL 51 03/08/2015 1215   CHOLHDL 3.6 02/09/2019 0000   VLDL 17 05/28/2016 1041   LDLCALC 125 (H) 02/09/2019 0000    Physical Exam:    VS:  BP 110/90 (BP Location: Right Arm, Patient Position: Sitting, Cuff Size: Normal)   Pulse 90   Ht '5\' 5"'$  (1.651 m)   Wt 155 lb (70.3 kg)   SpO2 98%   BMI 25.79 kg/m     Wt Readings from Last 3 Encounters:  11/11/19 155 lb (70.3 kg)  11/09/19 156 lb 11.2 oz (71.1 kg)  10/21/19 160 lb (72.6 kg)     GEN:  Well nourished, well developed in no acute distress HEENT: Normal NECK: No JVD; No carotid bruits LYMPHATICS: No lymphadenopathy CARDIAC: RRR, no murmurs, rubs, gallops RESPIRATORY:  Clear to auscultation without rales, wheezing or rhonchi  ABDOMEN: Soft, non-tender, non-distended MUSCULOSKELETAL:  No edema; No deformity  SKIN: Warm and dry NEUROLOGIC:  Alert and oriented x 3 PSYCHIATRIC:  Normal affect   ASSESSMENT:    1. Chest pain of uncertain etiology   2. Dyspnea on exertion   3. Essential hypertension   4. Precordial pain   5. Chest pain, unspecified type   6. Shortness of breath    PLAN:    In order of problems listed above:  1. Patient with symptoms of chest pain consistent with angina.  He has risk factors of  hypertension, hyperlipidemia, former smoker.  Will evaluate for presence of CAD with a Lexiscan Myoview.  Patient originally was scheduled for coronary CTA but apparently his insurance will have him pay out-of-pocket.   2. Patient with dyspnea on exertion.  This could be an anginal equivalent or secondary to cardiac dysfunction.  Obtain an echocardiogram to evaluate any structural dysfunction. 3. History of hypertension, blood pressure well controlled.  Continue current BP meds.  Follow-up after echocardiogram and Myoview.  Total encounter time 65 minutes  Greater than 50% was spent in counseling and coordination of care with the patient Time spent answering patient's questions, dictation for testing, usage of interpreter services.  This note was generated in part or whole with voice recognition software. Voice recognition is usually quite accurate but there are transcription errors that can and very often do occur. I apologize for any typographical errors that were not detected and corrected.  Medication Adjustments/Labs and Tests Ordered: Current medicines are reviewed at length with the patient today.  Concerns regarding medicines are outlined above.  Orders Placed This Encounter  Procedures  . CT CORONARY MORPH W/CTA COR W/SCORE W/CA W/CM &/OR WO/CM  . CT CORONARY FRACTIONAL FLOW RESERVE DATA PREP  . CT CORONARY FRACTIONAL FLOW RESERVE FLUID ANALYSIS  . NM Myocar Multi W/Spect W/Wall Motion / EF  . EKG 12-Lead  . ECHOCARDIOGRAM COMPLETE   Meds ordered this encounter  Medications  . DISCONTD: metoprolol tartrate (LOPRESSOR) 100 MG tablet    Sig: Take 1 tablet ('100mg'$ ) by mouth, 2 hour prior to your CTA.    Dispense:  1 tablet    Refill:  0    Patient Instructions  Medication Instructions:  No Changes to your daily medications. Please see instructions for your pre-procedure medication changes.   *  If you need a refill on your cardiac medications before your next appointment, please  call your pharmacy*   Lab Work: None Ordered If you have labs (blood work) drawn today and your tests are completely normal, you will receive your results only by: Marland Kitchen MyChart Message (if you have MyChart) OR . A paper copy in the mail If you have any lab test that is abnormal or we need to change your treatment, we will call you to review the results.  Testing/Procedures:  Your physician has requested that you have an echocardiogram. Echocardiography is a painless test that uses sound waves to create images of your heart. It provides your doctor with information about the size and shape of your heart and how well your heart's chambers and valves are working. This procedure takes approximately one hour. There are no restrictions for this procedure.  [place]  Your caregiver has ordered a Stress Test with nuclear imaging. The purpose of this test is to evaluate the blood supply to your heart muscle. This procedure is referred to as a "Non-Invasive Stress Test." This is because other than having an IV started in your vein, nothing is inserted or "invades" your body. Cardiac stress tests are done to find areas of poor blood flow to the heart by determining the extent of coronary artery disease (CAD). Some patients exercise on a treadmill, which naturally increases the blood flow to your heart, while others who are  unable to walk on a treadmill due to physical limitations have a pharmacologic/chemical stress agent called Lexiscan . This medicine will mimic walking on a treadmill by temporarily increasing your coronary blood flow.   Please note: these test may take anywhere between 2-4 hours to complete  PLEASE REPORT TO [facility] AT THE FIRST DESK WILL DIRECT YOU WHERE TO GO  Date of Procedure:_____________________________________  Arrival Time for Procedure:______________________________    PLEASE NOTIFY THE OFFICE AT LEAST 24 HOURS IN ADVANCE IF YOU ARE  UNABLE TO KEEP YOUR APPOINTMENT.  [phone] AND  PLEASE NOTIFY NUCLEAR MEDICINE AT [facility] AT LEAST 24 HOURS IN ADVANCE IF YOU ARE UNABLE TO KEEP YOUR APPOINTMENT. [phone]  How to prepare for your Myoview test:  1. Do not eat or drink after midnight 2. No caffeine for 24 hours prior to test 3. No smoking 24 hours prior to test. 4. Your medication may be taken with water.  If your doctor stopped a medication because of this test, do not take that medication. 5. Ladies, please do not wear dresses.  Skirts or pants are appropriate. Please wear a short sleeve shirt. 6. No perfume, cologne or lotion. 7. Wear comfortable walking shoes.           Follow-Up: At [facility], you and your health needs are our priority.  As part of our continuing mission to provide you with exceptional heart care, we have created designated Provider Care Teams.  These Care Teams include your primary Cardiologist (physician) and Advanced Practice Providers (APPs -  Physician Assistants and Nurse Practitioners) who all work together to provide you with the care you need, when you need it.  We recommend signing up for the patient portal called "MyChart".  Sign up information is provided on this After Visit Summary.  MyChart is used to connect with patients for Virtual Visits (Telemedicine).  Patients are able to view lab/test results, encounter notes, upcoming appointments, etc.  Non-urgent messages can be sent to your provider as well.  To learn more about what you can do with MyChart, go to [email or web address].    Your next appointment:   Follow up after testing.   The format for your next appointment:   In Person  Provider:   Kate Sable, MD       Signed, Kate Sable, MD  11/11/2019 2:03 PM    [place]

## 2019-11-12 ENCOUNTER — Encounter: Payer: Self-pay | Admitting: Dermatology

## 2019-11-22 ENCOUNTER — Other Ambulatory Visit: Payer: Self-pay

## 2019-11-22 ENCOUNTER — Encounter
Admission: RE | Admit: 2019-11-22 | Discharge: 2019-11-22 | Disposition: A | Discharge status: Home / Self Care | Payer: Medicare Other | Source: Ambulatory Visit | Attending: Cardiology | Admitting: Cardiology

## 2019-11-22 DIAGNOSIS — R0602 Shortness of breath: Secondary | ICD-10-CM | POA: Diagnosis not present

## 2019-11-22 DIAGNOSIS — R079 Chest pain, unspecified: Secondary | ICD-10-CM

## 2019-11-22 LAB — NM MYOCAR MULTI W/SPECT W/WALL MOTION / EF
Estimated workload: 1 METS
Exercise duration (min): 0 min
Exercise duration (sec): 0 s
LV dias vol: 56 mL (ref 62–150)
LV sys vol: 31 mL
MPHR: 154 {beats}/min
Peak HR: 101 {beats}/min
Percent HR: 65 %
Rest HR: 63 {beats}/min
SDS: 0
SRS: 1
SSS: 0
TID: 0.93

## 2019-11-22 MED ORDER — TECHNETIUM TC 99M TETROFOSMIN IV KIT
10.0000 | PACK | Freq: Once | INTRAVENOUS | Status: AC | PRN
  Administered 2019-11-22: 10.3 via INTRAVENOUS

## 2019-11-22 MED ORDER — REGADENOSON 0.4 MG/5ML IV SOLN
0.4000 mg | Freq: Once | INTRAVENOUS | Status: AC
  Administered 2019-11-22: 0.4 mg via INTRAVENOUS

## 2019-11-22 MED ORDER — TECHNETIUM TC 99M TETROFOSMIN IV KIT
30.0000 | PACK | Freq: Once | INTRAVENOUS | Status: AC | PRN
  Administered 2019-11-22: 32.83 via INTRAVENOUS

## 2019-12-23 ENCOUNTER — Other Ambulatory Visit: Payer: Self-pay

## 2019-12-23 ENCOUNTER — Ambulatory Visit (INDEPENDENT_AMBULATORY_CARE_PROVIDER_SITE_OTHER): Payer: Medicare Other

## 2019-12-23 DIAGNOSIS — R079 Chest pain, unspecified: Secondary | ICD-10-CM | POA: Diagnosis not present

## 2019-12-23 DIAGNOSIS — R0600 Dyspnea, unspecified: Secondary | ICD-10-CM

## 2019-12-23 DIAGNOSIS — R0609 Other forms of dyspnea: Secondary | ICD-10-CM

## 2019-12-27 ENCOUNTER — Ambulatory Visit (INDEPENDENT_AMBULATORY_CARE_PROVIDER_SITE_OTHER): Payer: Medicare Other | Admitting: Cardiology

## 2019-12-27 ENCOUNTER — Encounter: Payer: Self-pay | Admitting: Cardiology

## 2019-12-27 ENCOUNTER — Other Ambulatory Visit: Payer: Self-pay

## 2019-12-27 VITALS — BP 110/80 | HR 77 | Ht 65.0 in | Wt 158.4 lb

## 2019-12-27 DIAGNOSIS — R079 Chest pain, unspecified: Secondary | ICD-10-CM

## 2019-12-27 DIAGNOSIS — R0602 Shortness of breath: Secondary | ICD-10-CM | POA: Diagnosis not present

## 2019-12-27 DIAGNOSIS — I10 Essential (primary) hypertension: Secondary | ICD-10-CM

## 2019-12-27 NOTE — Progress Notes (Signed)
Cardiology Office Note:    Date:  12/27/2019   ID:  David Skinner Skinner, Star [birth year], MRN [id number]  PCP:  [name], MD  Cardiologist:  Kate Sable, MD  Electrophysiologist:  None   Referring MD: [name], MD   Chief Complaint  Patient presents with  . OTHER    F/u echo. Meds reviewed verbally with pt.    History of Present Illness:   David Skinner interpreter used for this encounter.  David Skinner Skinner is a 68 y.o. male with a hx of hypertension, hyperlipidemia, former smoker x10 to 15 years who presents for follow-up.  He was last seen due to chest pain and shortness of breath associated with exertion.  Due to symptoms and risk factors, echocardiogram and Lexiscan Myoview was ordered.  He now presents for results.  He states feeling okay, but endorses shortness of breath and asthma attacks.  He states having a history of asthma.  He sees his primary care provider for this, has not seen pulmonary medicine.   Past Medical History:  Diagnosis Date  . Allergy   . Anxiety   . Atopy   . Cataract of right eye    [facility]  . Chronic constipation   . Elevated LFTs   . Fatty liver   . GERD (gastroesophageal reflux disease)   . Hyperlipidemia   . Hypertension   . Hypoglycemia   . Insomnia   . Memory change   . Overweight   . Paresthesia of hand   . Seizure ([place])    12/12, 09/27/14, 02/28/16  . Tenosynovitis of finger   . Tinea cruris   . Vitamin D deficiency     Past Surgical History:  Procedure Laterality Date  . COLONOSCOPY WITH PROPOFOL N/A 03/25/2017   Procedure: COLONOSCOPY WITH PROPOFOL;  Surgeon: Lin Landsman, MD;  Location: [place];  Service: Endoscopy;  Laterality: N/A;  . ESOPHAGOGASTRODUODENOSCOPY N/A 03/25/2017   Procedure: ESOPHAGOGASTRODUODENOSCOPY (EGD);  Surgeon: Lin Landsman, MD;  Location: [place];  Service: Endoscopy;  Laterality: N/A;  Diabetic - oral meds  . LASIK Bilateral     Current  Medications: Current Meds  Medication Sig  . acetaminophen (TYLENOL) 325 MG tablet Take 650 mg by mouth every 6 (six) hours as needed for moderate pain.  Marland Kitchen albuterol (VENTOLIN HFA) 108 (90 Base) MCG/ACT inhaler TAKE 2 PUFFS BY MOUTH EVERY 6 HOURS AS NEEDED FOR WHEEZE OR SHORTNESS OF BREATH  . aspirin EC 81 MG tablet Take 1 tablet (81 mg total) by mouth daily.  Marland Kitchen atorvastatin (LIPITOR) 80 MG tablet TAKE 1 TABLET BY MOUTH EVERY DAY  . Blood Glucose Monitoring Suppl (ACCU-CHEK AVIVA PLUS) w/Device KIT 1 kit by Does not apply route daily.  Marland Kitchen donepezil (ARICEPT) 5 MG tablet Take 5 mg by mouth at bedtime.   . DULoxetine (CYMBALTA) 60 MG capsule TAKE 1 CAPSULE BY MOUTH EVERY DAY  . fluticasone (FLONASE) 50 MCG/ACT nasal spray Place 2 sprays into both nostrils daily.  Marland Kitchen glucose blood (ACCU-CHEK AVIVA PLUS) test strip 1 each by Other route 2 (two) times daily as needed for other. c  . ketoconazole (NIZORAL) 2 % shampoo Shampoo scalp 3 times a week  . Lancets (ACCU-CHEK SOFT TOUCH) lancets 1 each by Other route 2 (two) times daily as needed for other. DMII  . levETIRAcetam (KEPPRA) 250 MG tablet TK 1 T PO AT NIGHT  . loratadine (CLARITIN) 10 MG tablet Take 1 tablet (10 mg total) by mouth 2 (  two) times daily.  Marland Kitchen losartan (COZAAR) 50 MG tablet Take 1 tablet (50 mg total) by mouth daily.  . montelukast (SINGULAIR) 10 MG tablet TAKE 1 TABLET DAILY  . olopatadine (PATANOL) 0.1 % ophthalmic solution Place 1 drop into both eyes 2 (two) times daily.  . tadalafil (CIALIS) 5 MG tablet Take 1 tablet (5 mg total) by mouth daily as needed for erectile dysfunction.  . traZODone (DESYREL) 100 MG tablet Take 1 tablet (100 mg total) by mouth at bedtime.  . Vitamin D, Ergocalciferol, (DRISDOL) 1.25 MG (50000 UNIT) CAPS capsule TAKE 1 CAPSULE EVERY 7 DAYS     Allergies:   Ibuprofen, Levofloxacin, Lisinopril, Metoprolol, Nsaids, Tolmetin, and Tramadol hcl   Social History   Socioeconomic History  . Marital status:  Married    Spouse name: [name]  . Number of children: 3  . Years of education: Not on file  . Highest education level: 12th grade  Occupational History  . Occupation: Retired   Tobacco Use  . Smoking status: Former Smoker    Packs/day: 1.00    Years: 15.00    Pack years: 15.00    Types: Cigarettes    Start date: 06/17/1984    Quit date: 11/28/1999    Years since quitting: 20.0  . Smokeless tobacco: Never Used  Vaping Use  . Vaping Use: Never used  Substance and Sexual Activity  . Alcohol use: Yes    Alcohol/week: 0.0 standard drinks    Comment: occassional beer  . Drug use: No  . Sexual activity: Yes    Partners: Female  Other Topics Concern  . Not on file  Social History Narrative   Originally from [place]    Social Determinants of Health   Financial Resource Strain:   . Difficulty of Paying Living Expenses:   Food Insecurity:   . Worried About Charity fundraiser in the Last Year:   . Arboriculturist in the Last Year:   Transportation Needs:   . Film/video editor (Medical):   Marland Kitchen Lack of Transportation (Non-Medical):   Physical Activity:   . Days of Exercise per Week:   . Minutes of Exercise per Session:   Stress:   . Feeling of Stress :   Social Connections:   . Frequency of Communication with Friends and Family:   . Frequency of Social Gatherings with Friends and Family:   . Attends Religious Services:   . Active Member of Clubs or Organizations:   . Attends Archivist Meetings:   Marland Kitchen Marital Status:      Family History: The patient's family history includes Diabetes in his mother; Heart disease in his mother.  ROS:   Please see the history of present illness.     All other systems reviewed and are negative.  EKGs/Labs/Other Studies Reviewed:    The following studies were reviewed today:   EKG:  EKG is  ordered today.  The ekg ordered today demonstrates sinus rhythm, left axis deviation  Recent Labs: 02/09/2019: ALT 47; BUN 18; Creat  0.98; Hemoglobin 15.8; Platelets 252; Potassium 4.5; Sodium 137  Recent Lipid Panel    Component Value Date/Time   CHOL 231 (H) 02/09/2019 0000   CHOL 183 03/08/2015 1215   TRIG 265 (H) 02/09/2019 0000   HDL 65 02/09/2019 0000   HDL 51 03/08/2015 1215   CHOLHDL 3.6 02/09/2019 0000   VLDL 17 05/28/2016 1041   LDLCALC 125 (H) 02/09/2019 0000    Physical Exam:  VS:  BP 110/80 (BP Location: Left Arm, Patient Position: Sitting, Cuff Size: Normal)   Pulse 77   Ht '5\' 5"'$  (1.651 m)   Wt 158 lb 6 oz (71.8 kg)   SpO2 97%   BMI 26.35 kg/m     Wt Readings from Last 3 Encounters:  12/27/19 158 lb 6 oz (71.8 kg)  11/11/19 155 lb (70.3 kg)  11/09/19 156 lb 11.2 oz (71.1 kg)     GEN:  Well nourished, well developed in no acute distress HEENT: Normal NECK: No JVD; No carotid bruits LYMPHATICS: No lymphadenopathy CARDIAC: RRR, no murmurs, rubs, gallops RESPIRATORY:  Clear to auscultation without rales, wheezing or rhonchi  ABDOMEN: Soft, non-tender, non-distended MUSCULOSKELETAL:  No edema; No deformity  SKIN: Warm and dry NEUROLOGIC:  Alert and oriented x 3 PSYCHIATRIC:  Normal affect   ASSESSMENT:    1. Chest pain of uncertain etiology   2. Essential hypertension   3. Shortness of breath    PLAN:    In order of problems listed above:  1. Patient with symptoms of chest pain consistent with angina.  He has risk factors of hypertension, hyperlipidemia, former smoker.  Echocardiogram showed normal systolic function, impaired relaxation, EF 60 to 65%.  Lexiscan Myoview with no evidence for ischemia.  Low risk scan.  Patient reassured. 2. History of hypertension, blood pressure well controlled.  Continue current BP meds. 3. Patient with history of asthma, frequent attacks.  Will refer patient to pulmonary medicine for additional input and management.  Currently on Singulair and albuterol as needed.  Follow-up after echocardiogram and Myoview.  Total encounter 42 minutes  Greater  than 50% was spent in counseling and coordination of care with the patient Time spent answering patient's questions, explaining test results, usage of interpreter services.  This note was generated in part or whole with voice recognition software. Voice recognition is usually quite accurate but there are transcription errors that can and very often do occur. I apologize for any typographical errors that were not detected and corrected.  Medication Adjustments/Labs and Tests Ordered: Current medicines are reviewed at length with the patient today.  Concerns regarding medicines are outlined above.  Orders Placed This Encounter  Procedures  . Ambulatory referral to Pulmonology  . EKG 12-Lead   No orders of the defined types were placed in this encounter.   Patient Instructions  Medication Instructions:  No Changes *If you need a refill on your cardiac medications before your next appointment, please call your pharmacy*   Lab Work: None Ordered If you have labs (blood work) drawn today and your tests are completely normal, you will receive your results only by: Marland Kitchen MyChart Message (if you have MyChart) OR . A paper copy in the mail If you have any lab test that is abnormal or we need to change your treatment, we will call you to review the results.   Testing/Procedures: None [place]   Follow-Up: At [facility], you and your health needs are our priority.  As part of our continuing mission to provide you with exceptional heart care, we have created designated Provider Care Teams.  These Care Teams include your primary Cardiologist (physician) and Advanced Practice Providers (APPs -  Physician Assistants and Nurse Practitioners) who all work together to provide you with the care you need, when you need it.  We recommend signing up for the patient portal called "MyChart".  Sign up information is provided on this After Visit Summary.  MyChart is used to  connect with patients for Virtual  Visits (Telemedicine).  Patients are able to view lab/test results, encounter notes, upcoming appointments, etc.  Non-urgent messages can be sent to your provider as well.   To learn more about what you can do with MyChart, go to [email or web address].    Your next appointment:   1 year(s)  The format for your next appointment:   In Person  Provider:   Kate Sable, MD   Other Instructions  Referral to Pulmonary     Signed, Kate Sable, MD  12/27/2019 10:01 AM    [place]

## 2019-12-27 NOTE — Patient Instructions (Signed)
Medication Instructions:  No Changes *If you need a refill on your cardiac medications before your next appointment, please call your pharmacy*   Lab Work: None Ordered If you have labs (blood work) drawn today and your tests are completely normal, you will receive your results only by: Marland Kitchen MyChart Message (if you have MyChart) OR . A paper copy in the mail If you have any lab test that is abnormal or we need to change your treatment, we will call you to review the results.   Testing/Procedures: None [place]   Follow-Up: At [facility], you and your health needs are our priority.  As part of our continuing mission to provide you with exceptional heart care, we have created designated Provider Care Teams.  These Care Teams include your primary Cardiologist (physician) and Advanced Practice Providers (APPs -  Physician Assistants and Nurse Practitioners) who all work together to provide you with the care you need, when you need it.  We recommend signing up for the patient portal called "MyChart".  Sign up information is provided on this After Visit Summary.  MyChart is used to connect with patients for Virtual Visits (Telemedicine).  Patients are able to view lab/test results, encounter notes, upcoming appointments, etc.  Non-urgent messages can be sent to your provider as well.   To learn more about what you can do with MyChart, go to [email or web address].    Your next appointment:   1 year(s)  The format for your next appointment:   In Person  Provider:   Kate Sable, MD   Other Instructions  Referral to Pulmonary

## 2019-12-28 ENCOUNTER — Other Ambulatory Visit: Payer: Self-pay | Admitting: Family Medicine

## 2019-12-28 DIAGNOSIS — E559 Vitamin D deficiency, unspecified: Secondary | ICD-10-CM

## 2019-12-28 NOTE — Telephone Encounter (Signed)
Requested medications are due for refill today?  Yes  - 50,000 IU strengths cannot be delegated.    Requested medications are on active medication list?  Yes  Last Refill:   07/27/2019  # 12 with one refill   Future visit scheduled? Yes  Notes to Clinic:  50,000 IU strengths cannot be delegated.

## 2020-01-20 ENCOUNTER — Ambulatory Visit (INDEPENDENT_AMBULATORY_CARE_PROVIDER_SITE_OTHER): Payer: Medicare Other | Admitting: Dermatology

## 2020-01-20 ENCOUNTER — Other Ambulatory Visit: Payer: Self-pay

## 2020-01-20 DIAGNOSIS — L739 Follicular disorder, unspecified: Secondary | ICD-10-CM | POA: Diagnosis not present

## 2020-01-20 DIAGNOSIS — L219 Seborrheic dermatitis, unspecified: Secondary | ICD-10-CM

## 2020-01-20 MED ORDER — DOXYCYCLINE MONOHYDRATE 50 MG PO TABS: 50.0000 mg | ORAL_TABLET | Freq: Every day | ORAL | 6 refills | Status: DC

## 2020-01-20 MED ORDER — KETOCONAZOLE 2 % EX SHAM: MEDICATED_SHAMPOO | CUTANEOUS | 6 refills | Status: DC

## 2020-01-20 MED ORDER — MOMETASONE FUROATE 0.1 % EX SOLN: CUTANEOUS | 0 refills | Status: DC

## 2020-01-20 NOTE — Progress Notes (Signed)
   Follow-Up Visit   Subjective  David Skinner is a 69 y.o. 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  Interpreter with patient he communicates for patient.  The following portions of the chart were reviewed this encounter and updated as appropriate:  Tobacco  Allergies  Meds  Problems  Med Hx  Surg Hx  Fam Hx     Review of Systems:  No other skin or systemic complaints except as noted in HPI or Assessment and Plan.  Objective  Well appearing patient in no apparent distress; mood and affect are within normal limits.  A focused examination was performed including scalp. Relevant physical exam findings are noted in the Assessment and Plan.  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  I, Othelia Pulling, RMA, am acting as scribe for Sarina Ser, MD .  Documentation: I have reviewed the above documentation for accuracy and completeness, and I agree with the above.  Sarina Ser, MD

## 2020-01-22 ENCOUNTER — Other Ambulatory Visit: Payer: Self-pay | Admitting: Family Medicine

## 2020-01-22 ENCOUNTER — Encounter: Payer: Self-pay | Admitting: Dermatology

## 2020-01-22 DIAGNOSIS — F5101 Primary insomnia: Secondary | ICD-10-CM

## 2020-01-22 NOTE — Telephone Encounter (Signed)
Requested Prescriptions  Pending Prescriptions Disp Refills  . traZODone (DESYREL) 100 MG tablet [Pharmacy Med Name: TRAZODONE TAB 100MG ] 90 tablet 0    Sig: TAKE 1 TABLET AT BEDTIME     Psychiatry: Antidepressants - Serotonin Modulator Passed - 01/22/2020  8:17 AM      Passed - Completed PHQ-2 or PHQ-9 in the last 360 days.      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)   [facility] [name], MD      Future Appointments            In 1 month Ancil Boozer, Drue Stager, MD [facility], [place]   In 6 months Ralene Bathe, MD [place]   In 9 months Stoioff, Ronda Fairly, [place]

## 2020-01-24 ENCOUNTER — Other Ambulatory Visit: Payer: Self-pay

## 2020-01-24 DIAGNOSIS — L739 Follicular disorder, unspecified: Secondary | ICD-10-CM

## 2020-01-24 MED ORDER — KETOCONAZOLE 2 % EX SHAM: MEDICATED_SHAMPOO | CUTANEOUS | 6 refills | Status: DC

## 2020-01-25 ENCOUNTER — Other Ambulatory Visit: Payer: Self-pay

## 2020-01-25 DIAGNOSIS — L739 Follicular disorder, unspecified: Secondary | ICD-10-CM

## 2020-01-25 DIAGNOSIS — L219 Seborrheic dermatitis, unspecified: Secondary | ICD-10-CM

## 2020-01-25 MED ORDER — MOMETASONE FUROATE 0.1 % EX SOLN: CUTANEOUS | 0 refills | Status: DC

## 2020-01-25 MED ORDER — KETOCONAZOLE 2 % EX SHAM: MEDICATED_SHAMPOO | CUTANEOUS | 6 refills | Status: DC

## 2020-01-25 MED ORDER — DOXYCYCLINE MONOHYDRATE 50 MG PO TABS: 50.0000 mg | ORAL_TABLET | Freq: Every day | ORAL | 6 refills | Status: DC

## 2020-01-25 NOTE — Progress Notes (Signed)
Patient came into office today requesting prescriptions be switched to David Skinner for cheaper pricing.

## 2020-02-16 ENCOUNTER — Telehealth: Payer: Self-pay | Admitting: *Deleted

## 2020-02-16 NOTE — Chronic Care Management (AMB) (Signed)
  Chronic Care Skinner   Outreach Note  02/16/2020 Name: David Skinner MRN: [id number] DOB: March 28, 1951  David Skinner is a 69 y.o. year old male who is a primary care patient of David Sizer, MD. I reached out to David Skinner by phone today in response to a referral sent by David Skinner health plan.     An unsuccessful telephone outreach was attempted today. The patient was referred to the case Skinner team for assistance with care Skinner and care coordination.   Follow Up Plan: The care Skinner team will reach out to the patient again over the next 7 days. If patient returns call to provider office, please advise to call David Skinner at [phone].  David Skinner

## 2020-02-24 ENCOUNTER — Encounter: Payer: Self-pay | Admitting: Pulmonary Disease

## 2020-02-24 ENCOUNTER — Ambulatory Visit (INDEPENDENT_AMBULATORY_CARE_PROVIDER_SITE_OTHER): Payer: Medicare Other | Admitting: Pulmonary Disease

## 2020-02-24 VITALS — BP 130/90 | HR 82 | Ht 65.0 in | Wt 156.6 lb

## 2020-02-24 DIAGNOSIS — R0602 Shortness of breath: Secondary | ICD-10-CM | POA: Diagnosis not present

## 2020-02-24 DIAGNOSIS — R0683 Snoring: Secondary | ICD-10-CM

## 2020-02-24 NOTE — Patient Instructions (Signed)
Will schedule pulmonary function test and home sleep study  Follow up in 8 weeks

## 2020-02-24 NOTE — Progress Notes (Signed)
[facility]  Chief Complaint  Patient presents with   Consult    Patient has asthma and OSA. Shortness of breath with exertion and is always tired, snores, does not wake up gasping for air. Patient states he has to take a sleeping pill or he cant sleep and wakes up a few times a night    Constitutional:  BP 130/90 (BP Location: Left Arm, Patient Position: Sitting, Cuff Size: Normal)    Pulse 82    Ht _0  (1.651 m)    Wt 156 lb 9.6 oz (71 kg)    SpO2 96%    BMI 26.06 kg/m   Past Medical History:  HTN, HLD, Allergies, Anxiety, Cataract, Constipation, Fatty liver, GERD, Seizures, Vit D deficiency  Past Surgical History:  His  has a past surgical history that includes LASIK (Bilateral); Colonoscopy with propofol (N/A, 03/25/2017); and Esophagogastroduodenoscopy (N/A, 03/25/2017).  Brief Summary:  David Skinner is a 69 y.o. male with former smoker with dyspnea and snoring.      Subjective:  Interview conducted with help of a Optometrist.  He was seen by cardiology recently for assessment of dyspnea.  He has history of smoking and asthma.  He was advised to have assessment by pulmonary medicine.  He is also having trouble with his sleep and snores.  He had sleep study in 2018 that showed REM related sleep apnea.  He has cardiac CT scheduled.  He quit smoking in June.  Smoked 1 ppd.  No history of pneumonia or thromboembolic disease.    He gets allergies.  Uncertain if he was ever told he had asthma.  Get discomfort and tightness in chest, especially with activity.  Has wheezing at times.  No skin rash, gland swelling, or joint swelling.  Denies fever, hemoptysis.  He still snores.  Has trouble sleeping on his back.  Wakes up feeling choked.  Epworth score if 6 out of 24.   Physical Exam:   Appearance - well kempt   ENMT - no sinus tenderness, no oral exudate, no LAN, Mallampati 4 airway, no stridor, scalloped tongue  Respiratory -  equal breath sounds bilaterally, no wheezing or rales  CV - s1s2 regular rate and rhythm, no murmurs  Ext - no clubbing, no edema  Skin - no rashes  Psych - normal mood and affect   Pulmonary testing:   Spirometry 06/15/15 >> FEV1 2.38 (78%), FEV1% 81  Chest Imaging:    Sleep Tests:   PSG 12/04/16 >> AHI 1.7, REM AHI 6.4, SpO2 low 83.4%  Cardiac Tests:   Echo 12/23/19 >> EF 60 to 65%, grade 1 DD  Social History:  He  reports that he quit smoking about 20 years ago. His smoking use included cigarettes. He started smoking about 35 years ago. He has a 15.00 pack-year smoking history. He has never used smokeless tobacco. He reports current alcohol use. He reports that he does not use drugs.  Family History:  His family history includes Diabetes in his mother; Heart disease in his mother.     Assessment/Plan:   Dyspnea on exertion with history of tobacco abuse. - will arrange for pulmonary function test to further assess for obstructive lung disease - he has cardiac CT scheduled and this should provide information regarding his lungs also - continue singulair, prn ventolin for now  Snoring. - will arrange for home sleep study to assess whether he has progression of sleep disordered breathing that might warrant  therapy  Allergic rhinitis and conjunctivitis.  - continue flonase, claritin, singulair, olopatadine  Time Spent Involved in Patient Care on Day of Examination:  34 minutes  Follow up:  Patient Instructions  Will schedule pulmonary function test and home sleep study  Follow up in 8 weeks   Medication List:   Allergies as of 02/24/2020      Reactions   Ibuprofen Itching   Levofloxacin    Lisinopril Cough   Metoprolol    Nsaids Itching   Tolmetin Itching   Tramadol Hcl Itching      Medication List       Accurate as of February 24, 2020 11:23 AM. If you have any questions, ask your nurse or doctor.        Accu-Chek Aviva Plus w/Device Kit 1 kit  by Does not apply route daily.   accu-chek soft touch lancets 1 each by Other route 2 (two) times daily as needed for other. DMII   acetaminophen 325 MG tablet Commonly known as: TYLENOL Take 650 mg by mouth every 6 (six) hours as needed for moderate pain.   albuterol 108 (90 Base) MCG/ACT inhaler Commonly known as: VENTOLIN HFA TAKE 2 PUFFS BY MOUTH EVERY 6 HOURS AS NEEDED FOR WHEEZE OR SHORTNESS OF BREATH   aspirin EC 81 MG tablet Take 1 tablet (81 mg total) by mouth daily.   atorvastatin 80 MG tablet Commonly known as: LIPITOR TAKE 1 TABLET BY MOUTH EVERY DAY   donepezil 5 MG tablet Commonly known as: ARICEPT Take 5 mg by mouth at bedtime.   doxycycline 50 MG tablet Commonly known as: ADOXA Take 1 tablet (50 mg total) by mouth daily. Take with food and drink   DULoxetine 60 MG capsule Commonly known as: CYMBALTA TAKE 1 CAPSULE BY MOUTH EVERY DAY   fluticasone 50 MCG/ACT nasal spray Commonly known as: FLONASE Place 2 sprays into both nostrils daily.   glucose blood test strip Commonly known as: Accu-Chek Aviva Plus 1 each by Other route 2 (two) times daily as needed for other. c   ketoconazole 2 % shampoo Commonly known as: NIZORAL Shampoo scalp 3 times a week, let sit 10 minutes and rinse out   levETIRAcetam 250 MG tablet Commonly known as: KEPPRA TK 1 T PO AT NIGHT   loratadine 10 MG tablet Commonly known as: CLARITIN Take 1 tablet (10 mg total) by mouth 2 (two) times daily.   losartan 50 MG tablet Commonly known as: COZAAR Take 1 tablet (50 mg total) by mouth daily.   mometasone 0.1 % lotion Commonly known as: ELOCON Apply topically as directed. Apply 1-2 gtts to aa itchy scalp qd up to 4 days a week   montelukast 10 MG tablet Commonly known as: SINGULAIR TAKE 1 TABLET DAILY   olopatadine 0.1 % ophthalmic solution Commonly known as: Patanol Place 1 drop into both eyes 2 (two) times daily.   tadalafil 5 MG tablet Commonly known as: Cialis Take  1 tablet (5 mg total) by mouth daily as needed for erectile dysfunction.   traZODone 100 MG tablet Commonly known as: DESYREL TAKE 1 TABLET AT BEDTIME   Vitamin D (Ergocalciferol) 1.25 MG (50000 UNIT) Caps capsule Commonly known as: DRISDOL TAKE 1 CAPSULE EVERY 7 DAYS       Signature:  Chesley Mires, MD [place] Pager - [phone] 02/24/2020, 11:23 AM

## 2020-02-27 ENCOUNTER — Other Ambulatory Visit: Payer: Self-pay | Admitting: Family Medicine

## 2020-02-27 DIAGNOSIS — J302 Other seasonal allergic rhinitis: Secondary | ICD-10-CM

## 2020-02-27 NOTE — Telephone Encounter (Signed)
Requested Prescriptions  Pending Prescriptions Disp Refills  . 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, [place]   In 1 month Chesley Mires, MD [place]   In 5 months Ralene Bathe, MD [place]   In 7 months Zebulon, Ronda Fairly, [facility]

## 2020-02-29 NOTE — Chronic Care Management (AMB) (Signed)
  Chronic Care Management   Outreach Note  02/29/2020 Name: David Skinner MRN: [id number] DOB: 04/26/1951  David Skinner is a 69 y.o. year old male who is a primary care patient of [name], MD. I reached out to David Skinner by phone today in response to a referral sent by Mr. David Skinner's health plan.     A second unsuccessful telephone outreach was attempted today. The patient was referred to the case management team for assistance with care management and care coordination.   Follow Up Plan: The care management team will reach out to the patient again over the next 7 days. If patient returns call to provider office, please advise to call [place] at [phone].  [place] Management

## 2020-03-03 ENCOUNTER — Ambulatory Visit (INDEPENDENT_AMBULATORY_CARE_PROVIDER_SITE_OTHER): Payer: Medicare Other | Admitting: Urology

## 2020-03-03 ENCOUNTER — Encounter: Payer: Self-pay | Admitting: Urology

## 2020-03-03 ENCOUNTER — Other Ambulatory Visit: Payer: Self-pay

## 2020-03-03 VITALS — BP 133/84 | HR 80 | Ht 65.0 in | Wt 160.0 lb

## 2020-03-03 DIAGNOSIS — N138 Other obstructive and reflux uropathy: Secondary | ICD-10-CM | POA: Diagnosis not present

## 2020-03-03 DIAGNOSIS — N401 Enlarged prostate with lower urinary tract symptoms: Secondary | ICD-10-CM

## 2020-03-03 DIAGNOSIS — R399 Unspecified symptoms and signs involving the genitourinary system: Secondary | ICD-10-CM | POA: Diagnosis not present

## 2020-03-03 LAB — BLADDER SCAN AMB NON-IMAGING: Scan Result: 11

## 2020-03-03 NOTE — Progress Notes (Signed)
03/03/2020 1:38 PM   David Skinner Sep 12, 1950 [id number]  Referring provider: [name], MD [address] [place],  [place] [address]  Chief Complaint  Patient presents with  . Benign Prostatic Hypertrophy    HPI: 69 y.o. male presents for follow-up of lower urinary tract symptoms.  He is accompanied by a [place] interpreter.   Initially seen 05/2017 with 5+ year history of mixed voiding symptoms including frequency, weak stream, hesitancy and nocturia x5  Had been on tamsulosin by PCP at time of initial visit  Myrbetriq added  Follow-up January 2019, did not take Myrbetriq because he lost samples.  Cystoscopy without significant lateral lobe enlargement or other abnormalities  Was again given Myrbetriq samples and on follow-up 07/2017 indicated significant improvement in his lower urinary tract symptoms and was without complaints  On follow-up in February 2020 indicated he had stopped Myrbetriq due to bedwetting which apparently resolved after stopping; IPSS 24/35 and bothersome symptoms were primarily obstructive.  Given a trial of silodosin  Follow-up 09/2018 reported significant improvement in his voiding pattern with IPSS 18/35  Follow-up 10/2019 reported 26-monthhistory storage related voiding symptoms including frequency, urgency with occasional urge incontinence.  Solifenacin added however unclear if he ever took medication  He presently complains of urinary frequency intermittently up to 10-20 times per day and nocturia x3  Presently does not know which medications he is taking for his urinary tract but apparently no longer taking silodosin  PMH: Past Medical History:  Diagnosis Date  . Allergy   . Anxiety   . Atopy   . [facility]aract of right eye    [facility] Chronic const[facility]   . Elevated LFTs   . Fatty liver   . GERD (gastroesophageal reflux disease)   . Hyperlipidemia   . Hypertension   . Hypoglycemia   . Insomnia   .  Memory change   . Overweight   . Paresthesia of hand   . [place]    [place]12, 09/27/14, 02/28/16  . Tenosynovitis of finger   . Tinea cruris   . Vitamin D deficiency     Surgical History: Past Surgical History:  Procedure Laterality Date  . COLONOSCOPY WITH PROPOFOL N/A 03/25/2017   Procedure: COLONOSCOPY WITH PROPOFOL;  Surgeon: VLin Landsman MD;  [facility]  Service: Endoscopy;  Laterality: N/A;  . ESOPHAGOGASTRODUODENOSCOPY N/A 03/25/2017   Procedure: ESOPHAGOGASTRODUODENOSCOPY (EGD);  Surgeon: VLin Landsman MD;  [place] [facility]  Service: Endoscopy;  Laterality: N/A;  Diabetic - oral meds  . LASIK Bilateral     Home Medications:  Allergies as of 03/03/2020      Reactions   Ibuprofen Itching   Levofloxacin    Lisinopril Cough   Metoprolol    Nsaids Itching   Tolmetin Itching   Tramadol Hcl Itching      Medication List       Accurate as of March 03, 2020  1:38 PM. If you have any questions, ask your nurse or doctor.        Accu-Chek Aviva Plus w/Device Kit 1 kit by Does not apply route daily.   accu-chek soft touch lancets 1 each by Other route 2 (two) times daily as needed for other. DMII   acetaminophen 325 MG tablet Commonly known as: TYLENOL Take 650 mg by mouth every 6 (six) hours as needed for moderate pain.   albuterol 108 (90 Base) MCG/ACT inhaler Commonly known as: VENTOLIN HFA TAKE 2  PUFFS BY MOUTH EVERY 6 HOURS AS NEEDED FOR WHEEZE OR SHORTNESS OF BREATH   aspirin EC 81 MG tablet Take 1 tablet (81 mg total) by mouth daily.   atorvastatin 80 MG tablet Commonly known as: LIPITOR TAKE 1 TABLET BY MOUTH EVERY DAY   donepezil 5 MG tablet Commonly known as: ARICEPT Take 5 mg by mouth at bedtime.   doxycycline 50 MG tablet Commonly known as: ADOXA Take 1 tablet (50 mg total) by mouth daily. Take with food and drink   DULoxetine 60 MG capsule Commonly known as: CYMBALTA TAKE 1 CAPSULE BY MOUTH  EVERY DAY   fluticasone 50 MCG/ACT nasal spray Commonly known as: FLONASE Place 2 sprays into both nostrils daily.   glucose blood test strip Commonly known as: Accu-Chek Aviva Plus 1 each by Other route 2 (two) times daily as needed for other. c   ketoconazole 2 % shampoo Commonly known as: NIZORAL Shampoo scalp 3 times a week, let sit 10 minutes and rinse out   levETIRAcetam 250 MG tablet Commonly known as: KEPPRA TK 1 T PO AT NIGHT   loratadine 10 MG tablet Commonly known as: CLARITIN Take 1 tablet (10 mg total) by mouth 2 (two) times daily.   losartan 50 MG tablet Commonly known as: COZAAR Take 1 tablet (50 mg total) by mouth daily.   mometasone 0.1 % lotion Commonly known as: ELOCON Apply topically as directed. Apply 1-2 gtts to aa itchy scalp qd up to 4 days a week   montelukast 10 MG tablet Commonly known as: SINGULAIR TAKE 1 TABLET DAILY   olopatadine 0.1 % ophthalmic solution Commonly known as: Patanol Place 1 drop into both eyes 2 (two) times daily.   tadalafil 5 MG tablet Commonly known as: Cialis Take 1 tablet (5 mg total) by mouth daily as needed for erectile dysfunction.   traZODone 100 MG tablet Commonly known as: DESYREL TAKE 1 TABLET AT BEDTIME   Vitamin D (Ergocalciferol) 1.25 MG (50000 UNIT) Caps capsule Commonly known as: DRISDOL TAKE 1 CAPSULE EVERY 7 DAYS       Allergies:  Allergies  Allergen Reactions  . Ibuprofen Itching  . Levofloxacin   . Lisinopril Cough  . Metoprolol   . Nsaids Itching  . Tolmetin Itching  . Tramadol Hcl Itching    Family History: Family History  Problem Relation Age of Onset  . Diabetes Mother   . Heart disease Mother     Social History:  reports that he quit smoking about 20 years ago. His smoking use included cigarettes. He started smoking about 35 years ago. He has a 15.00 pack-year smoking history. He has never used smokeless tobacco. He reports current alcohol use. He reports that he does not  use drugs.   Physical Exam: BP 133/84   Pulse 80   Ht $R'5\' 5"'YJ$  (1.651 m)   Wt 160 lb (72.6 kg)   BMI 26.63 kg/m   Constitutional:  Alert, No acute distress. HEENT: [place] AT, moist mucus membranes.  Trachea midline, no masses. Cardiovascular: No clubbing, cyanosis, or edema. Respiratory: Normal respiratory effort, no increased work of breathing. Skin: No rashes, bruises or suspicious lesions. Neurologic: Grossly intact, no focal deficits, moving all 4 extremities. Psychiatric: Normal mood and affect.   Assessment & Plan:    1. Lower urinary tract symptoms (LUTS)  Scan PVR 11 mL  Will restart silodosin  Will check with pharmacy to see if he is taking solifenacin  Call back 1 month regarding medication efficacy  Abbie Sons, [place] [address] [place], [place] [address] [phone]

## 2020-03-04 ENCOUNTER — Encounter: Payer: Self-pay | Admitting: Urology

## 2020-03-04 MED ORDER — SILODOSIN 8 MG PO CAPS: 8.0000 mg | ORAL_CAPSULE | Freq: Every day | ORAL | 1 refills | Status: DC

## 2020-03-06 LAB — URINALYSIS, COMPLETE
Bilirubin, UA: NEGATIVE
Glucose, UA: NEGATIVE
Ketones, UA: NEGATIVE
Leukocytes,UA: NEGATIVE
Nitrite, UA: NEGATIVE
Protein,UA: NEGATIVE
RBC, UA: NEGATIVE
Specific Gravity, UA: 1.025 (ref 1.005–1.030)
Urobilinogen, Ur: 0.2 mg/dL (ref 0.2–1.0)
pH, UA: 5.5 (ref 5.0–7.5)

## 2020-03-06 LAB — MICROSCOPIC EXAMINATION
Bacteria, UA: NONE SEEN
Epithelial Cells (non renal): NONE SEEN /hpf (ref 0–10)
RBC, Urine: NONE SEEN /hpf (ref 0–2)

## 2020-03-07 NOTE — Chronic Care Management (AMB) (Signed)
  Chronic Care Management   Note  03/07/2020 Name: David Skinner MRN: [id number] DOB: 1951-02-08  David Skinner is a 69 y.o. year old male who is a primary care patient of David Sizer, MD. I reached out to David Skinner by phone today in response to a referral sent by Mr. David Skinner's health plan.     David Skinner was given information about Chronic Care Management services today including:  1. CCM service includes personalized support from designated clinical staff supervised by his physician, including individualized plan of care and coordination with other care providers 2. 24/7 contact phone numbers for assistance for urgent and routine care needs. 3. Service will only be billed when office clinical staff spend 20 minutes or more in a month to coordinate care. 4. Only one practitioner may furnish and bill the service in a calendar month. 5. The patient may stop CCM services at any time (effective at the end of the month) by phone call to the office staff. 6. The patient will be responsible for cost sharing (co-pay) of up to 20% of the service fee (after annual deductible is met).  Patient did not agree to enrollment in care management services and does not wish to consider at this time.  Follow up plan: Patient declines further follow up and engagement by the care management team. Appropriate care team members and provider have been notified via electronic communication. The care management team is available to follow up with the patient after provider conversation with the patient regarding recommendation for care management engagement and subsequent re-referral to the care management team.   [place] Management

## 2020-03-09 NOTE — Progress Notes (Signed)
Name: David Skinner   MRN: [id number]    DOB: 08-30-1950   Date:03/10/2020       Progress Note  Subjective  Chief Complaint  Chief Complaint  Patient presents with  . Diabetes  . Depression  . Hyperlipidemia  . Hypertension    HPI  Diarrhea: started about 3 weeks ago with bloating followed by increase in bowel movements about 6 times per day, associated with mild  cramping but no blood in stools . He denies change in diet or medication prior to the symptoms. No sick contacts. No fever or chills. He has lack of appetite and has lost 5 lbs . He was given doxy by Dr. Nehemiah Massed, but he is not sure if currently taking it . He showed me a picture on his phone of a bottle of Miralax, he states he feels better when he takes it, explained that Miralax causes diarrhea, he can try Maalox or petobismol instead   Major depression recurrent :going on for years butworsein2018, he is on Cymbalta since April 2018, he stateshe is still working around the house and was doing well, but since he has not been feeling well he has noticed lack of energy and motivation. He is stable and has been compliant with medication   Hyperlipidemia :he is taking Atorvastatin 80 mg dailyNo myalgia, chest pain has resolved   OSA/minimal/RLS: he sees Dr Manuella Ghazi for memory loss and had sleep study2018it showed minimal sleep apnea and RLS -seeing Dr. Manuella Ghazi.Hestates that when he takes Trazodone he sleeps well but if he skips a dose he has difficulty falling asleep. Unchanged   HTN: he was off medication, but we resumed it on his last visit, BP is down again, we will adjust dose to 25 mg , continue current medication, no chest pain, palpitation or sob   Alcoholism : he used to drink heavy while in [place] used to drink liquor at least 3 times a week and used to get drunk, when he moved to [place] ( 1979). He was still drinking better but quit in2017.He states he started drinking alcohol again a couple of months ago, he  drinks beer and liquor 2-3 shots about once a month and during vacations he drinks daily.   Urinary frequency: seen by Urologistand states medication is working well for him now. Unchanged . Keep follow up with Urologist   Partial Seizure: Last episode 02/2016 and went to [facility],taking medication daily now and isseeing Dr. Manuella Ghazi. No recent episodes, explained importance of quitting drinking again .  Based on Dr. Genoveva Ill note he was supposed to stop Keppra and take Lamictal but he has not been taking it because it made him feel tired. 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  A1C has been stable and ttoday is 6.0 %, he  has denies polyphagia or polydipsia, has urinary frequency due to LUTS  Continues statin therapy. Advised to quit drinking again    Patient Active Problem List   Diagnosis Date Noted  . Alcoholism ([place]) 03/10/2020  . OSA (obstructive sleep apnea) 12/09/2016  . RLS (restless legs syndrome) 12/09/2016  . Episode of moderate major depression ([place]) 12/09/2016  . History of alcoholism ([place]) 07/22/2016  . Cervical radiculitis 01/09/2016  . Lumbosacral radiculitis 01/09/2016  . Osteoarthritis of carpometacarpal (CMC) joint of thumb 01/09/2016  . Tinea cruris  03/21/2015  . 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 Depression with anxiety 11/28/2014  . Chronic constipation 11/28/2014  . Insomnia 11/28/2014  . Dyslipidemia 11/28/2014  . Fatty infiltration of liver 11/28/2014  . Essential (primary) hypertension 11/28/2014  . Gastric reflux 11/28/2014  . Memory loss or impairment 11/28/2014  . Perennial allergic rhinitis with seasonal variation 11/28/2014  . Vitamin D deficiency 11/28/2014  . Tenosynovitis of thumb 11/28/2014  . Partial epilepsy with impairment of consciousness ([place])  11/16/2014    Past Surgical History:  Procedure Laterality Date  . COLONOSCOPY WITH PROPOFOL N/A 03/25/2017   Procedure: COLONOSCOPY WITH PROPOFOL;  Surgeon: Lin Landsman, MD;  Location: [facility];  Service: Endoscopy;  Laterality: N/A;  . ESOPHAGOGASTRODUODENOSCOPY N/A 03/25/2017   Procedure: ESOPHAGOGASTRODUODENOSCOPY (EGD);  Surgeon: Lin Landsman, MD;  Location: [facility];  Service: Endoscopy;  Laterality: N/A;  Diabetic - oral meds  . LASIK Bilateral     Family History  Problem Relation Age of Onset  . Diabetes Mother   . Heart disease Mother     Social History   Tobacco Use  . Smoking status: Former Smoker    Packs/day: 1.00    Years: 15.00    Pack years: 15.00    Types: Cigarettes    Start date: 06/17/1984    Quit date: 11/28/1999    Years since quitting: 20.2  . Smokeless tobacco: Never Used  Substance Use Topics  . Alcohol use: Yes    Alcohol/week: 0.0 standard drinks    Comment: occassional beer     Current Outpatient Medications:  .  acetaminophen (TYLENOL) 325 MG tablet, Take 650 mg by mouth every 6 (six) hours as needed for moderate pain., Disp: , Rfl:  .  albuterol (VENTOLIN HFA) 108 (90 Base) MCG/ACT inhaler, TAKE 2 PUFFS BY MOUTH EVERY 6 HOURS AS NEEDED FOR WHEEZE OR SHORTNESS OF BREATH, Disp: 18 g, Rfl: 0 .  aspirin EC 81 MG tablet, Take 1 tablet (81 mg total) by mouth daily., Disp: 30 tablet, Rfl: 0 .  atorvastatin (LIPITOR) 80 MG tablet, TAKE 1 TABLET BY MOUTH EVERY DAY, Disp: 90 tablet, Rfl: 1 .  Blood Glucose Monitoring Suppl (ACCU-CHEK AVIVA PLUS) w/Device KIT, 1 kit by Does not apply route daily., Disp: 1 kit, Rfl: 0 .  donepezil (ARICEPT) 5 MG tablet, Take 5 mg by mouth at bedtime. , Disp: , Rfl:  .  doxycycline (ADOXA) 50 MG tablet, Take 1 tablet (50 mg total) by mouth daily. Take with food and drink, Disp: 30 tablet, Rfl: 6 .  DULoxetine (CYMBALTA) 60 MG capsule, TAKE 1 CAPSULE BY MOUTH EVERY DAY, Disp: 90 capsule,  Rfl: 1 .  fluticasone (FLONASE) 50 MCG/ACT nasal spray, Place 2 sprays into both nostrils daily., Disp: 48 g, Rfl: 0 .  glucose blood (ACCU-CHEK AVIVA PLUS) test strip, 1 each by Other route 2 (two) times daily as needed for other. c, Disp: 100 each, Rfl: 3 .  ketoconazole (NIZORAL) 2 % shampoo, Shampoo scalp 3 times a week, let sit 10 minutes and rinse out, Disp: 120 mL, Rfl: 6 .  Lancets (ACCU-CHEK SOFT TOUCH) lancets, 1 each by Other route 2 (two) times daily as needed for other. DMII, Disp: 100 each, Rfl: 3 .  loratadine (CLARITIN) 10 MG tablet, Take 1 tablet (10 mg total) by mouth 2 (two) times daily., Disp: 180 tablet, Rfl: 1 .  losartan (COZAAR) 50 MG tablet, Take 1 tablet (50 mg  total) by mouth daily., Disp: 90 tablet, Rfl: 0 .  mometasone (ELOCON) 0.1 % lotion, Apply topically as directed. Apply 1-2 gtts to aa itchy scalp qd up to 4 days a week, Disp: 60 mL, Rfl: 0 .  montelukast (SINGULAIR) 10 MG tablet, TAKE 1 TABLET DAILY, Disp: 90 tablet, Rfl: 2 .  olopatadine (PATANOL) 0.1 % ophthalmic solution, Place 1 drop into both eyes 2 (two) times daily., Disp: 5 mL, Rfl: 2 .  silodosin (RAPAFLO) 8 MG CAPS capsule, Take 1 capsule (8 mg total) by mouth daily with breakfast., Disp: 30 capsule, Rfl: 1 .  tadalafil (CIALIS) 5 MG tablet, Take 1 tablet (5 mg total) by mouth daily as needed for erectile dysfunction., Disp: 30 tablet, Rfl: 5 .  traZODone (DESYREL) 100 MG tablet, TAKE 1 TABLET AT BEDTIME, Disp: 90 tablet, Rfl: 1 .  Vitamin D, Ergocalciferol, (DRISDOL) 1.25 MG (50000 UNIT) CAPS capsule, TAKE 1 CAPSULE EVERY 7 DAYS, Disp: 12 capsule, Rfl: 1  Allergies  Allergen Reactions  . Ibuprofen Itching  . Levofloxacin   . Lisinopril Cough  . Metoprolol   . Nsaids Itching  . Tolmetin Itching  . Tramadol Hcl Itching    I personally reviewed active problem list, medication list, allergies, family history, social history, health maintenance with the patient/caregiver  today.   ROS  Constitutional: Negative for fever , positive for weight change.  Respiratory: Negative for cough , he has occasional  shortness of breath.   Cardiovascular: Negative for chest pain or palpitations.  Gastrointestinal: positive  for abdominal pain and  bowel changes.  Musculoskeletal: Negative for gait problem or joint swelling.  Skin: Negative for rash.  Neurological: Negative for dizziness or headache.  No other specific complaints in a complete review of systems (except as listed in HPI above).  Objective  Vitals:   03/10/20 1111  BP: 100/70  Pulse: 84  Resp: 16  Temp: (!) 97.3 F (36.3 C)  TempSrc: Oral  SpO2: 96%  Weight: 154 lb 14.4 oz (70.3 kg)  Height: $Remove'5\' 5"'TyYiPao$  (1.651 m)    Body mass index is 25.78 kg/m.  Physical Exam  Constitutional: Patient appears well-developed and well-nourished. No distress.  HEENT: head atraumatic, normocephalic, pupils equal and reactive to light,  neck supple Cardiovascular: Normal rate, regular rhythm and normal heart sounds.  No murmur heard. No BLE edema. Pulmonary/Chest: Effort normal and breath sounds normal. No respiratory distress. Abdominal: Soft.  Increase in bowel sounds, mild pain during palpation of epigastric and Upper left quadrant pain  Psychiatric: Patient has a normal mood and affect. behavior is normal. Judgment and thought content normal.  Recent Results (from the past 2160 hour(s))  Bladder Scan (Post Void Residual) in office     Status: None   Collection Time: 03/03/20  1:34 PM  Result Value Ref Range   Scan Result 11   Urinalysis, Complete     Status: None   Collection Time: 03/03/20  3:52 PM  Result Value Ref Range   Specific Gravity, UA 1.025 1.005 - 1.030   pH, UA 5.5 5.0 - 7.5   Color, UA Yellow Yellow   Appearance Ur Clear Clear   Leukocytes,UA Negative Negative   Protein,UA Negative Negative/Trace   Glucose, UA Negative Negative   Ketones, UA Negative Negative   RBC, UA Negative Negative    Bilirubin, UA Negative Negative   Urobilinogen, Ur 0.2 0.2 - 1.0 mg/dL   Nitrite, UA Negative Negative   Microscopic Examination See below:   Microscopic  Examination     Status: None   Collection Time: 03/03/20  3:52 PM   Urine  Result Value Ref Range   WBC, UA 0-5 0 - 5 /hpf   RBC None seen 0 - 2 /hpf   Epithelial Cells (non renal) None seen 0 - 10 /hpf   Bacteria, UA None seen None seen/Few  POCT HgB A1C     Status: Abnormal   Collection Time: 03/10/20 11:40 AM  Result Value Ref Range   Hemoglobin A1C 6.0 (A) 4.0 - 5.6 %   HbA1c POC (<> result, manual entry)     HbA1c, POC (prediabetic range)     HbA1c, POC (controlled diabetic range)       PHQ2/9: Depression screen [facility] 2/9 03/10/2020 11/09/2019 10/08/2019 08/10/2019 06/09/2019  Decreased Interest 1 0 0 0 0  Down, Depressed, Hopeless 0 0 0 0 0  PHQ - 2 Score 1 0 0 0 0  Altered sleeping 0 1 0 0 0  Tired, decreased energy 2 0 3 0 1  Change in appetite 3 0 3 0 0  Feeling bad or failure about yourself  0 0 0 0 0  Trouble concentrating 0 0 0 0 0  Moving slowly or fidgety/restless 0 0 0 0 0  Suicidal thoughts 0 0 0 0 0  PHQ-9 Score $RemoveBef'6 1 6 'CHRNEFSlcJ$ 0 1  Difficult doing work/chores Somewhat difficult Not difficult at all Not difficult at all Not difficult at all Not difficult at all  Some recent data might be hidden    phq 9 is positive   Fall Risk: Fall Risk  03/10/2020 11/09/2019 10/08/2019 08/10/2019 06/09/2019  Falls in the past year? 0 0 0 0 0  Number falls in past yr: 0 - 0 0 0  Injury with Fall? 0 - 0 0 0  Follow up - - - Falls evaluation completed -    Functional Status Survey: Is the patient deaf or have difficulty hearing?: Yes Does the patient have difficulty seeing, even when wearing glasses/contacts?: Yes Does the patient have difficulty concentrating, remembering, or making decisions?: No Does the patient have difficulty walking or climbing stairs?: Yes Does the patient have difficulty dressing or bathing?: No Does the  patient have difficulty doing errands alone such as visiting a doctor's office or shopping?: No    Assessment & Plan  1. 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 Need for immunization against influenza  - Flu Vaccine QUAD High Dose(Fluad)  3. Diarrhea, unspecified type  - CBC with Differential/Platelet - COMPLETE METABOLIC PANEL WITH GFR - Gastrointestinal Panel by PCR , Stool - Lipase  4. Major depression in remission (HCC)  - DULoxetine (CYMBALTA) 60 MG capsule; Take 1 capsule (60 mg total) by mouth daily.  Dispense: 90 capsule; Refill: 1  5. History of alcoholism ([place])   6. Essential (primary) hypertension  - CBC with Differential/Platelet - COMPLETE METABOLIC PANEL WITH GFR - losartan (COZAAR) 25 MG tablet; Take 1 tablet (25 mg total) by mouth daily.  Dispense: 90 tablet; Refill: 1  7. OSA (obstructive sleep apnea)   8. Partial epilepsy with impairment of consciousness ([place])   9. Dyslipidemia  - Lipid panel - atorvastatin (LIPITOR) 80 MG tablet; Take 1 tablet (80 mg total) by mouth daily.  Dispense: 90 tablet; Refill: 1  10. Primary insomnia  On medication   11. Vitamin D deficiency  - VITAMIN D 25 Hydroxy (Vit-D Deficiency, Fractures)  12. Lower urinary tract symptoms (LUTS)  Seeing Urologist   13. Alcoholism ([place])  Needs to stop drinking again   14. Epigastric pain  - Lipase

## 2020-03-10 ENCOUNTER — Encounter: Payer: Self-pay | Admitting: Family Medicine

## 2020-03-10 ENCOUNTER — Other Ambulatory Visit: Payer: Self-pay

## 2020-03-10 ENCOUNTER — Ambulatory Visit (INDEPENDENT_AMBULATORY_CARE_PROVIDER_SITE_OTHER): Payer: Medicare Other | Admitting: Family Medicine

## 2020-03-10 VITALS — BP 100/70 | HR 84 | Temp 97.3°F | Resp 16 | Ht 65.0 in | Wt 154.9 lb

## 2020-03-10 DIAGNOSIS — R197 Diarrhea, unspecified: Secondary | ICD-10-CM

## 2020-03-10 DIAGNOSIS — Z23 Encounter for immunization: Secondary | ICD-10-CM

## 2020-03-10 DIAGNOSIS — E1140 Type 2 diabetes mellitus with diabetic neuropathy, unspecified: Secondary | ICD-10-CM | POA: Diagnosis not present

## 2020-03-10 DIAGNOSIS — E785 Hyperlipidemia, unspecified: Secondary | ICD-10-CM

## 2020-03-10 DIAGNOSIS — R1013 Epigastric pain: Secondary | ICD-10-CM

## 2020-03-10 DIAGNOSIS — I10 Essential (primary) hypertension: Secondary | ICD-10-CM | POA: Diagnosis not present

## 2020-03-10 DIAGNOSIS — F1021 Alcohol dependence, in remission: Secondary | ICD-10-CM

## 2020-03-10 DIAGNOSIS — N521 Erectile dysfunction due to diseases classified elsewhere: Secondary | ICD-10-CM | POA: Diagnosis not present

## 2020-03-10 DIAGNOSIS — E559 Vitamin D deficiency, unspecified: Secondary | ICD-10-CM

## 2020-03-10 DIAGNOSIS — F5101 Primary insomnia: Secondary | ICD-10-CM

## 2020-03-10 DIAGNOSIS — R399 Unspecified symptoms and signs involving the genitourinary system: Secondary | ICD-10-CM

## 2020-03-10 DIAGNOSIS — F325 Major depressive disorder, single episode, in full remission: Secondary | ICD-10-CM

## 2020-03-10 DIAGNOSIS — G4733 Obstructive sleep apnea (adult) (pediatric): Secondary | ICD-10-CM

## 2020-03-10 DIAGNOSIS — F1020 Alcohol dependence, uncomplicated: Secondary | ICD-10-CM

## 2020-03-10 DIAGNOSIS — G40209 Localization-related (focal) (partial) symptomatic epilepsy and epileptic syndromes with complex partial seizures, not intractable, without status epilepticus: Secondary | ICD-10-CM

## 2020-03-10 LAB — POCT GLYCOSYLATED HEMOGLOBIN (HGB A1C): Hemoglobin A1C: 6 % — AB (ref 4.0–5.6)

## 2020-03-10 MED ORDER — DULOXETINE HCL 60 MG PO CPEP: 60.0000 mg | ORAL_CAPSULE | Freq: Every day | ORAL | 1 refills | Status: DC

## 2020-03-10 MED ORDER — ATORVASTATIN CALCIUM 80 MG PO TABS: 80.0000 mg | ORAL_TABLET | Freq: Every day | ORAL | 1 refills | Status: DC

## 2020-03-10 MED ORDER — LOSARTAN POTASSIUM 25 MG PO TABS: 25.0000 mg | ORAL_TABLET | Freq: Every day | ORAL | 1 refills | Status: DC

## 2020-03-10 NOTE — Patient Instructions (Signed)

## 2020-03-11 LAB — COMPLETE METABOLIC PANEL WITH GFR
AG Ratio: 1.6 (calc) (ref 1.0–2.5)
ALT: 55 U/L — ABNORMAL HIGH (ref 9–46)
AST: 31 U/L (ref 10–35)
Albumin: 4.7 g/dL (ref 3.6–5.1)
Alkaline phosphatase (APISO): 71 U/L (ref 35–144)
BUN: 10 mg/dL (ref 7–25)
CO2: 30 mmol/L (ref 20–32)
Calcium: 10 mg/dL (ref 8.6–10.3)
Chloride: 102 mmol/L (ref 98–110)
Creat: 0.9 mg/dL (ref 0.70–1.25)
GFR, Est African American: 103 mL/min/{1.73_m2} (ref >=60)
GFR, Est Non African American: 89 mL/min/{1.73_m2} (ref >=60)
Globulin: 3 g/dL (calc) (ref 1.9–3.7)
Glucose, Bld: 100 mg/dL — ABNORMAL HIGH (ref 65–99)
Potassium: 4.6 mmol/L (ref 3.5–5.3)
Sodium: 139 mmol/L (ref 135–146)
Total Bilirubin: 1.2 mg/dL (ref 0.2–1.2)
Total Protein: 7.7 g/dL (ref 6.1–8.1)

## 2020-03-11 LAB — LIPID PANEL
Cholesterol: 130 mg/dL (ref <200)
HDL: 55 mg/dL (ref >=40)
LDL Cholesterol (Calc): 59 mg/dL (calc)
Non-HDL Cholesterol (Calc): 75 mg/dL (calc) (ref <130)
Total CHOL/HDL Ratio: 2.4 (calc) (ref <5.0)
Triglycerides: 80 mg/dL (ref <150)

## 2020-03-11 LAB — CBC WITH DIFFERENTIAL/PLATELET
Absolute Monocytes: 576 cells/uL (ref 200–950)
Basophils Absolute: 17 cells/uL (ref 0–200)
Basophils Relative: 0.3 %
Eosinophils Absolute: 171 cells/uL (ref 15–500)
Eosinophils Relative: 3 %
HCT: 52.1 % — ABNORMAL HIGH (ref 38.5–50.0)
Hemoglobin: 17.3 g/dL — ABNORMAL HIGH (ref 13.2–17.1)
Lymphs Abs: 1590 cells/uL (ref 850–3900)
MCH: 31.3 pg (ref 27.0–33.0)
MCHC: 33.2 g/dL (ref 32.0–36.0)
MCV: 94.2 fL (ref 80.0–100.0)
MPV: 9.3 fL (ref 7.5–12.5)
Monocytes Relative: 10.1 %
Neutro Abs: 3346 cells/uL (ref 1500–7800)
Neutrophils Relative %: 58.7 %
Platelets: 258 10*3/uL (ref 140–400)
RBC: 5.53 10*6/uL (ref 4.20–5.80)
RDW: 12.8 % (ref 11.0–15.0)
Total Lymphocyte: 27.9 %
WBC: 5.7 10*3/uL (ref 3.8–10.8)

## 2020-03-11 LAB — LIPASE: Lipase: 163 U/L — ABNORMAL HIGH (ref 7–60)

## 2020-03-11 LAB — VITAMIN D 25 HYDROXY (VIT D DEFICIENCY, FRACTURES): Vit D, 25-Hydroxy: 87 ng/mL (ref 30–100)

## 2020-03-13 ENCOUNTER — Other Ambulatory Visit: Payer: Self-pay | Admitting: Family Medicine

## 2020-03-13 DIAGNOSIS — R1013 Epigastric pain: Secondary | ICD-10-CM

## 2020-03-14 DIAGNOSIS — E559 Vitamin D deficiency, unspecified: Secondary | ICD-10-CM | POA: Diagnosis not present

## 2020-03-14 DIAGNOSIS — R1013 Epigastric pain: Secondary | ICD-10-CM | POA: Diagnosis not present

## 2020-03-14 DIAGNOSIS — E785 Hyperlipidemia, unspecified: Secondary | ICD-10-CM | POA: Diagnosis not present

## 2020-03-14 DIAGNOSIS — R197 Diarrhea, unspecified: Secondary | ICD-10-CM | POA: Diagnosis not present

## 2020-03-14 DIAGNOSIS — I10 Essential (primary) hypertension: Secondary | ICD-10-CM | POA: Diagnosis not present

## 2020-03-15 LAB — AMYLASE: Amylase: 58 U/L (ref 21–101)

## 2020-03-15 LAB — H. PYLORI BREATH TEST: H. pylori Breath Test: NOT DETECTED

## 2020-03-15 LAB — LIPASE: Lipase: 46 U/L (ref 7–60)

## 2020-03-16 ENCOUNTER — Other Ambulatory Visit: Payer: Self-pay

## 2020-03-16 ENCOUNTER — Other Ambulatory Visit: Payer: Self-pay | Admitting: Family Medicine

## 2020-03-16 ENCOUNTER — Ambulatory Visit: Payer: Medicare Other

## 2020-03-16 DIAGNOSIS — R0683 Snoring: Secondary | ICD-10-CM

## 2020-03-16 DIAGNOSIS — R1013 Epigastric pain: Secondary | ICD-10-CM

## 2020-03-16 DIAGNOSIS — G4733 Obstructive sleep apnea (adult) (pediatric): Secondary | ICD-10-CM | POA: Diagnosis not present

## 2020-03-17 LAB — GASTROINTESTINAL PATHOGEN PANEL PCR
C. difficile Tox A/B, PCR: NOT DETECTED
Campylobacter, PCR: NOT DETECTED
Cryptosporidium, PCR: NOT DETECTED
E coli (ETEC) LT/ST PCR: NOT DETECTED
E coli (STEC) stx1/stx2, PCR: NOT DETECTED
E coli 0157, PCR: NOT DETECTED
Giardia lamblia, PCR: NOT DETECTED
Norovirus, PCR: NOT DETECTED
Rotavirus A, PCR: NOT DETECTED
Salmonella, PCR: NOT DETECTED
Shigella, PCR: NOT DETECTED

## 2020-03-20 ENCOUNTER — Telehealth: Payer: Self-pay | Admitting: Pulmonary Disease

## 2020-03-20 DIAGNOSIS — G4733 Obstructive sleep apnea (adult) (pediatric): Secondary | ICD-10-CM | POA: Diagnosis not present

## 2020-03-20 NOTE — Telephone Encounter (Signed)
HST 03/16/20 >> AHI 4.2, SpO2 low 85%.   Please let him know that his sleep study did not show sleep apnea.  Will discuss in more detail at his next visit in November.

## 2020-03-22 NOTE — Telephone Encounter (Signed)
Attempted to call patient using interpreter line and patient did not answer and we were unable to leave a message due to voicemail not being set up.

## 2020-03-24 ENCOUNTER — Encounter: Payer: Self-pay | Admitting: *Deleted

## 2020-03-24 NOTE — Telephone Encounter (Signed)
Letter sent to patient regarding HST result per Dr Halford Chessman. Nothing further needed at this time.

## 2020-03-24 NOTE — Progress Notes (Signed)
Spoke with pt wife Clarise Cruz) and informed her that [facility] has been trying to contact her husband. Gave her the number to return their call

## 2020-04-04 ENCOUNTER — Other Ambulatory Visit: Payer: Self-pay | Admitting: Family Medicine

## 2020-04-04 DIAGNOSIS — E785 Hyperlipidemia, unspecified: Secondary | ICD-10-CM

## 2020-04-04 NOTE — Telephone Encounter (Signed)
Requested Prescriptions  Pending Prescriptions Disp Refills   atorvastatin (LIPITOR) 80 MG tablet [Pharmacy Med Name: ATORVASTATIN 80 MG TABLET] 90 tablet 3    Sig: TAKE 1 TABLET BY MOUTH EVERY DAY     Cardiovascular:  Antilipid - Statins Passed - 04/04/2020  1:29 AM      Passed - Total Cholesterol in normal range and within 360 days    Cholesterol, Total  Date Value Ref Range Status  03/08/2015 183 100 - 199 mg/dL Final   Cholesterol  Date Value Ref Range Status  03/10/2020 130 <200 mg/dL Final         Passed - LDL in normal range and within 360 days    LDL Cholesterol (Calc)  Date Value Ref Range Status  03/10/2020 59 mg/dL (calc) Final    Comment:    Reference range: <100 . Desirable range <100 mg/dL for primary prevention;   <70 mg/dL for patients with CHD or diabetic patients  with > or = 2 CHD risk factors. Marland Kitchen LDL-C is now calculated using the Martin-Hopkins  calculation, which is a validated novel method providing  better accuracy than the Friedewald equation in the  estimation of LDL-C.  Cresenciano Genre et al. Annamaria Helling. 7711;657(90): 2061-2068  (http://education.[employer].com/faq/FAQ164)          Passed - HDL in normal range and within 360 days    HDL  Date Value Ref Range Status  03/10/2020 55 > OR = 40 mg/dL Final  03/08/2015 51 >39 mg/dL Final    Comment:    According to ATP-III Guidelines, HDL-C >59 mg/dL is considered a negative risk factor for CHD.          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  [facility])   [facility] [name], MD   5 months ago Productive cough   [facility] [name], MD   7 months ago Rash in adult   [facility] [name], MD   10 months ago 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.

## 2020-04-07 ENCOUNTER — Telehealth: Payer: Self-pay

## 2020-04-07 NOTE — Telephone Encounter (Addendum)
ATC patient to relay date/time of covid test prior to PFT. vm is not setup.  [facility]1

## 2020-04-10 NOTE — Telephone Encounter (Signed)
ATC patient x2--unable to leave vm due to mailbox not being setup.  Will close encounter per office protocol.   

## 2020-04-11 ENCOUNTER — Other Ambulatory Visit: Admission: RE | Admit: 2020-04-11 | Payer: PRIVATE HEALTH INSURANCE | Source: Ambulatory Visit

## 2020-04-12 ENCOUNTER — Other Ambulatory Visit
Admission: RE | Admit: 2020-04-12 | Discharge: 2020-04-12 | Disposition: A | Discharge status: Home / Self Care | Payer: Medicare Other | Source: Ambulatory Visit | Attending: Pulmonary Disease | Admitting: Pulmonary Disease

## 2020-04-12 ENCOUNTER — Other Ambulatory Visit: Payer: Self-pay

## 2020-04-12 ENCOUNTER — Ambulatory Visit: Payer: Medicare Other

## 2020-04-12 DIAGNOSIS — Z01812 Encounter for preprocedural laboratory examination: Secondary | ICD-10-CM | POA: Insufficient documentation

## 2020-04-12 DIAGNOSIS — Z20822 Contact with and (suspected) exposure to covid-19: Secondary | ICD-10-CM | POA: Diagnosis not present

## 2020-04-12 LAB — SARS CORONAVIRUS 2 (TAT 6-24 HRS): SARS Coronavirus 2: NEGATIVE

## 2020-04-13 ENCOUNTER — Ambulatory Visit: Payer: Medicare Other | Attending: Pulmonary Disease

## 2020-04-13 DIAGNOSIS — R0602 Shortness of breath: Secondary | ICD-10-CM | POA: Insufficient documentation

## 2020-04-13 LAB — PULMONARY FUNCTION TEST ARMC ONLY
DL/VA % pred: 146 %
DL/VA: 6.12 ml/min/mmHg/L
DLCO unc % pred: 104 %
DLCO unc: 23.49 ml/min/mmHg
FEF 25-75 Post: 4.18 L/sec
FEF 25-75 Pre: 2.75 L/sec
FEF2575-%Change-Post: 51 %
FEV1-%Change-Post: 9 %
FEV1-Post: 2.78 L
FEV1-Pre: 2.53 L
FEV1FVC-%Change-Post: 1 %
FEV6-%Change-Post: 7 %
FEV6-Post: 3.22 L
FEV6-Pre: 2.99 L
FVC-%Change-Post: 7 %
FVC-Post: 3.22 L
FVC-Pre: 2.99 L
Post FEV1/FVC ratio: 86 %
Post FEV6/FVC ratio: 100 %
Pre FEV1/FVC ratio: 85 %
Pre FEV6/FVC Ratio: 100 %
RV % pred: 105 %
RV: 2.22 L
TLC % pred: 85 %
TLC: 5.15 L

## 2020-04-13 MED ORDER — ALBUTEROL SULFATE (2.5 MG/3ML) 0.083% IN NEBU
2.5000 mg | INHALATION_SOLUTION | Freq: Once | RESPIRATORY_TRACT | Status: AC
  Administered 2020-04-13: 2.5 mg via RESPIRATORY_TRACT
  Filled 2020-04-13: qty 3

## 2020-04-19 DIAGNOSIS — Z23 Encounter for immunization: Secondary | ICD-10-CM | POA: Diagnosis not present

## 2020-04-20 ENCOUNTER — Other Ambulatory Visit: Payer: Self-pay

## 2020-04-20 ENCOUNTER — Encounter: Payer: Self-pay | Admitting: Pulmonary Disease

## 2020-04-20 ENCOUNTER — Telehealth: Payer: Self-pay | Admitting: Cardiology

## 2020-04-20 ENCOUNTER — Ambulatory Visit (INDEPENDENT_AMBULATORY_CARE_PROVIDER_SITE_OTHER): Payer: Medicare Other | Admitting: Pulmonary Disease

## 2020-04-20 VITALS — BP 134/78 | HR 100 | Temp 97.0°F | Ht 65.0 in | Wt 157.8 lb

## 2020-04-20 DIAGNOSIS — R0683 Snoring: Secondary | ICD-10-CM | POA: Diagnosis not present

## 2020-04-20 DIAGNOSIS — I10 Essential (primary) hypertension: Secondary | ICD-10-CM

## 2020-04-20 DIAGNOSIS — J4520 Mild intermittent asthma, uncomplicated: Secondary | ICD-10-CM | POA: Diagnosis not present

## 2020-04-20 NOTE — Patient Instructions (Signed)
Follow up in 6 months 

## 2020-04-20 NOTE — Telephone Encounter (Signed)
Patient would like to schedule cardiac CT at this time. Patient advised insurance Josem Kaufmann will have to be obtained first and then he will be called

## 2020-04-20 NOTE — Progress Notes (Signed)
[facility]  Chief Complaint  Patient presents with  . Follow-up    review PFT--c/o sob with exertion and rest.    Constitutional:  BP 134/78 (BP Location: Left Arm, Cuff Size: Normal)   Pulse 100   Temp (!) 97 F (36.1 C) (Temporal)   Ht $R'5\' 5"'km$  (1.651 m)   Wt 157 lb 12.8 oz (71.6 kg)   SpO2 96%   BMI 26.26 kg/m   Past Medical History:  HTN, HLD, Allergies, Anxiety, Cataract, Constipation, Fatty liver, GERD, Seizures, Vit D deficiency  Past Surgical History:  His  has a past surgical history that includes LASIK (Bilateral); Colonoscopy with propofol (N/A, 03/25/2017); and Esophagogastroduodenoscopy (N/A, 03/25/2017).  Brief Summary:  David Skinner is a 69 y.o. male with former smoker with dyspnea and snoring.      Subjective:   Interview conduct with assistance of translator.  He had PFT that showed borderline bronchodilator response, but otherwise was normal.  Home sleep study showed a few obstructive respiratory events, but not enough to qualify for sleep apnea.  He is sleeping better now and doesn't feel like his sleep is an issue at this time.  He breathing is better with change of season.  He is not having cough, wheeze, chest congestion, or sinus congestion.  Cardiac CT hasn't been scheduled yet.   Physical Exam:   Appearance - well kempt   ENMT - no sinus tenderness, no oral exudate, no LAN, Mallampati 4 airway, no stridor  Respiratory - equal breath sounds bilaterally, no wheezing or rales  CV - s1s2 regular rate and rhythm, no murmurs  Ext - no clubbing, no edema  Skin - no rashes  Psych - normal mood and affect    Pulmonary testing:   Spirometry 06/15/15 >> FEV1 2.38 (78%), FEV1% 81  PFT 04/13/20 >> FEV1 2.78, FEV1% 86, TLC 5.15 (85%), DLCO 104%  Chest Imaging:    Sleep Tests:   PSG 12/04/16 >> AHI 1.7, REM AHI 6.4, SpO2 low 83.4%  HST 03/16/20 >> AHI 4.2, SpO2 low 85%  Cardiac Tests:    Echo 12/23/19 >> EF 60 to 65%, grade 1 DD  Social History:  He  reports that he quit smoking about 20 years ago. His smoking use included cigarettes. He started smoking about 35 years ago. He has a 15.00 pack-year smoking history. He has never used smokeless tobacco. He reports current alcohol use. He reports that he does not use drugs.  Family History:  His family history includes Diabetes in his mother; Heart disease in his mother.     Assessment/Plan:   Dyspnea on exertion with history of tobacco abuse. - likely from allergic asthma - symptoms improved at this time - continue singulair qhs - prn ventolin, claritin - advised him to f/u with cardiology to determine what the issue was with getting cardiac CT scheduled  Snoring. - sleep study was negative for significant obstructive sleep apnea - discussed positional therapy to help with snoring  Allergic rhinitis and conjunctivitis.  - continue singulair - prn claritin, olopatadine, flonase   Time Spent Involved in Patient Care on Day of Examination:  22 minutes  Follow up:  Patient Instructions  Follow up in 6 months   Medication List:   Allergies as of 04/20/2020      Reactions   Ibuprofen Itching   Levofloxacin    Lisinopril Cough   Metoprolol    Nsaids Itching   Tolmetin Itching  Tramadol Hcl Itching      Medication List       Accurate as of April 20, 2020 12:12 PM. If you have any questions, ask your nurse or doctor.        STOP taking these medications   fluticasone 50 MCG/ACT nasal spray Commonly known as: FLONASE Stopped by: Chesley Mires, MD     TAKE these medications   Accu-Chek Aviva Plus w/Device Kit 1 kit by Does not apply route daily.   accu-chek soft touch lancets 1 each by Other route 2 (two) times daily as needed for other. DMII   acetaminophen 325 MG tablet Commonly known as: TYLENOL Take 650 mg by mouth every 6 (six) hours as needed for moderate pain.   albuterol 108 (90  Base) MCG/ACT inhaler Commonly known as: VENTOLIN HFA TAKE 2 PUFFS BY MOUTH EVERY 6 HOURS AS NEEDED FOR WHEEZE OR SHORTNESS OF BREATH   aspirin EC 81 MG tablet Take 1 tablet (81 mg total) by mouth daily.   atorvastatin 80 MG tablet Commonly known as: LIPITOR TAKE 1 TABLET BY MOUTH EVERY DAY   donepezil 5 MG tablet Commonly known as: ARICEPT Take 5 mg by mouth at bedtime.   doxycycline 50 MG tablet Commonly known as: ADOXA Take 1 tablet (50 mg total) by mouth daily. Take with food and drink   DULoxetine 60 MG capsule Commonly known as: CYMBALTA Take 1 capsule (60 mg total) by mouth daily.   glucose blood test strip Commonly known as: Accu-Chek Aviva Plus 1 each by Other route 2 (two) times daily as needed for other. c   ketoconazole 2 % shampoo Commonly known as: NIZORAL Shampoo scalp 3 times a week, let sit 10 minutes and rinse out   loratadine 10 MG tablet Commonly known as: CLARITIN Take 1 tablet (10 mg total) by mouth 2 (two) times daily.   losartan 25 MG tablet Commonly known as: COZAAR Take 1 tablet (25 mg total) by mouth daily.   mometasone 0.1 % lotion Commonly known as: ELOCON Apply topically as directed. Apply 1-2 gtts to aa itchy scalp qd up to 4 days a week   montelukast 10 MG tablet Commonly known as: SINGULAIR TAKE 1 TABLET DAILY   olopatadine 0.1 % ophthalmic solution Commonly known as: Patanol Place 1 drop into both eyes 2 (two) times daily.   silodosin 8 MG Caps capsule Commonly known as: RAPAFLO Take 1 capsule (8 mg total) by mouth daily with breakfast.   tadalafil 5 MG tablet Commonly known as: Cialis Take 1 tablet (5 mg total) by mouth daily as needed for erectile dysfunction.   traZODone 100 MG tablet Commonly known as: DESYREL TAKE 1 TABLET AT BEDTIME   Vitamin D (Ergocalciferol) 1.25 MG (50000 UNIT) Caps capsule Commonly known as: DRISDOL TAKE 1 CAPSULE EVERY 7 DAYS       Signature:  Chesley Mires, MD [place] Pager - [phone] 04/20/2020, 12:12 PM

## 2020-04-24 MED ORDER — METOPROLOL TARTRATE 100 MG PO TABS
100.0000 mg | ORAL_TABLET | Freq: Once | ORAL | 0 refills | Status: DC
Start: 2020-04-24 — End: 2020-05-18

## 2020-04-24 NOTE — Telephone Encounter (Signed)
Reviewed the following communication from Dr. Garen Lah with patients wife:  Kate Sable, MD  You 3 days ago   If patient is having symptoms of chest pain, then we may obtain a cardiac CT. If his symptoms of chest pain have resolved, no indication for cardiac CT. Thank you   Routing comment   You  Kate Sable, MD 3 days ago   This patient had a low risk Myoview on 6/7, was originally ordered a CTA in May but his insurance wouldn't cover it. It looks as though he saw Dr. Sylvan Cheese in Pulmonology yesterday, and he put in his notes that the patient has not had his CT yet...  I think that is why the patient is calling asking for it now.  Do you still want him to do this?   Routing comment    Patients wife stated that the patient has been experiencing some intermittent CP and would like to have the CTA. I informed her that I would email the scheduling office for this and that it will take a couple weeks for the authorization, then they will get a phone call to schedule. I also informed her that I would be sending the instructions for the CTA through [facility], I reviewed that the patient will need to take a dose of Lopressor 2 hours prior to the test and also get a lab draw prior and will also include this on the instructions.  Patients wife was grateful for the call back, verbalized understanding, and agreed with plan.  Your cardiac CT will be scheduled at:  [facility] [address] [place], [place] [address] [phone]  Please arrive 15 mins early for check-in and test prep.   Please follow these instructions carefully (unless otherwise directed):   **DO NOT TAKE erectile dysfunction medications at least 3 days (72 hrs) prior to test: tadalafil (CIALIS) 5 MG tablet   On the Night Before the Test:  Be sure to Drink plenty of water.  Do not consume any caffeinated/decaffeinated beverages or chocolate 12 hours prior to  your test.  Do not take any antihistamines 12 hours prior to your test.   On the Day of the Test:  Drink plenty of water. Do not drink any water within one hour of the test.  Do not eat any food 4 hours prior to the test.  You may take your regular medications prior to the test.   Take metoprolol (Lopressor) two hours prior to test. (Sent to [employer])        After the Test:  Drink plenty of water.  After receiving IV contrast, you may experience a mild flushed feeling. This is normal.  On occasion, you may experience a mild rash up to 24 hours after the test. This is not dangerous. If this occurs, you can take Benadryl 25 mg and increase your fluid intake.  If you experience trouble breathing, this can be serious. If it is severe call 911 IMMEDIATELY. If it is mild, please call our office.  If you take any of these medications: Glipizide/Metformin, Avandament, Glucavance, please do not take 48 hours after completing test unless otherwise instructed.   Once we have confirmed authorization from your insurance company, we will call you to set up a date and time for your test. Based on how quickly your insurance processes prior authorizations requests, please allow up to 4 weeks to be contacted for scheduling your Cardiac CT appointment. Be advised that routine  Cardiac CT appointments could be scheduled as many as 8 weeks after your provider has ordered it.  For non-scheduling related questions, please contact the cardiac imaging nurse navigator should you have any questions/concerns: Marchia Bond, Cardiac Imaging Nurse Navigator Burley Saver, Interim Cardiac Imaging Nurse [facility] [facility] Direct Office Dial: [phone]   For scheduling needs, including cancellations and rescheduling, please call Vivien Rota at [phone], option 3.

## 2020-04-24 NOTE — Telephone Encounter (Signed)
Called David Skinner back to ask about patients Metoprolol allergy. She stated that he had some itching when he took it in the past.  I spoke with Dr. Garen Lah and he said it was okay to take the Lopressor pre procedure and that the patient could take and antihistamine if needed AFTER the procedure.  David Skinner verbalized understanding and agreed with plan.

## 2020-05-02 ENCOUNTER — Telehealth (HOSPITAL_COMMUNITY): Payer: Self-pay | Admitting: *Deleted

## 2020-05-02 NOTE — Telephone Encounter (Signed)
Attempted to call patient regarding upcoming cardiac CT appointment but the phone was busy and unable to leave voicemail.  Will call daughter's phone.  Burley Saver RN Navigator Cardiac Imaging [facility] [phone] Office [phone] Cell

## 2020-05-02 NOTE — Telephone Encounter (Signed)
Attempted to call patient's daughter regarding upcoming cardiac CT appointment. Left message on voicemail with name and callback number  [place] RN Navigator Cardiac [facility] [phone] Office [phone] Cell

## 2020-05-03 ENCOUNTER — Telehealth (HOSPITAL_COMMUNITY): Payer: Self-pay | Admitting: *Deleted

## 2020-05-03 NOTE — Telephone Encounter (Signed)
With the aid of an interpreter (ID# [phone]), reached out to patient to offer assistance regarding upcoming cardiac imaging study; pt's wife verbalizes understanding of appt date/time, parking situation and where to check in, pre-test NPO status and medications ordered, and verified current allergies; name and call back number provided for further questions should they arise  [place] [facility] [phone] office [phone] cell  Pt's wife confirms pt has not taken any cialis in the past few days.

## 2020-05-04 ENCOUNTER — Other Ambulatory Visit: Payer: Self-pay

## 2020-05-04 ENCOUNTER — Ambulatory Visit
Admission: RE | Admit: 2020-05-04 | Discharge: 2020-05-04 | Disposition: A | Discharge status: Home / Self Care | Payer: Medicare Other | Source: Ambulatory Visit | Attending: Cardiology | Admitting: Cardiology

## 2020-05-04 DIAGNOSIS — R072 Precordial pain: Secondary | ICD-10-CM | POA: Diagnosis not present

## 2020-05-04 LAB — POCT I-STAT CREATININE: Creatinine, Ser: 1.1 mg/dL (ref 0.61–1.24)

## 2020-05-04 MED ORDER — IOHEXOL 350 MG/ML SOLN
75.0000 mL | Freq: Once | INTRAVENOUS | Status: AC | PRN
  Administered 2020-05-04: 75 mL via INTRAVENOUS

## 2020-05-04 MED ORDER — NITROGLYCERIN 0.4 MG SL SUBL
0.4000 mg | SUBLINGUAL_TABLET | Freq: Once | SUBLINGUAL | Status: AC
  Administered 2020-05-04: 0.4 mg via SUBLINGUAL

## 2020-05-04 NOTE — Progress Notes (Signed)
Patient tolerated CT well. Denies dizziness or has any complaints. Drinking water sitting in chair. Vital signs stable encourage to drink water throughout day.Reasons explained and verbalized understanding. Ambulated steady gait. Explained through interpretor who is at bedside.

## 2020-05-05 ENCOUNTER — Telehealth: Payer: Self-pay | Admitting: *Deleted

## 2020-05-05 NOTE — Telephone Encounter (Signed)
Pt notified of results below. Pt has further questions regarding plan of care and would like to schedule follow up. Dr. Thereasa Solo last note states to follow up after echo and myoview and does not currently have appt scheduled. Will forward to scheduling and pt states ok to send MyChart message with appt details.

## 2020-05-05 NOTE — Telephone Encounter (Signed)
-----   Message from Kate Sable, MD sent at 05/04/2020  5:58 PM EST ----- Mild calcification in the LAD causing mild disease.  No significant obstruction.

## 2020-05-08 NOTE — Telephone Encounter (Signed)
Yes, the pt requested a follow up appt after given results. Wants to discuss plan of care with MD. Thanks!

## 2020-05-08 NOTE — Telephone Encounter (Signed)
Last ov avs states 1 year .  Recall entered.

## 2020-05-08 NOTE — Telephone Encounter (Signed)
Do they need sooner? Please advise

## 2020-05-15 ENCOUNTER — Ambulatory Visit: Payer: Medicare Other | Admitting: Cardiology

## 2020-05-17 ENCOUNTER — Telehealth: Payer: Self-pay | Admitting: Family Medicine

## 2020-05-17 NOTE — Telephone Encounter (Signed)
Copied from [place] [id number]. Topic: Medicare AWV >> May 17, 2020 10:12 AM Cher Nakai R wrote: Reason for CRM:   No answer unable to leave a message for patient to call back and schedule Medicare Annual Wellness Visit (AWV) in office.   If not able to come in office, please offer to do virtually.   Last AWV 05/04/2018  Please schedule at anytime with [place].  40 minute appointment  Any questions, please contact me at [phone]

## 2020-05-18 ENCOUNTER — Other Ambulatory Visit: Payer: Self-pay

## 2020-05-18 ENCOUNTER — Encounter: Payer: Self-pay | Admitting: Cardiology

## 2020-05-18 ENCOUNTER — Ambulatory Visit (INDEPENDENT_AMBULATORY_CARE_PROVIDER_SITE_OTHER): Payer: Medicare Other | Admitting: Cardiology

## 2020-05-18 ENCOUNTER — Ambulatory Visit (INDEPENDENT_AMBULATORY_CARE_PROVIDER_SITE_OTHER): Payer: Medicare Other

## 2020-05-18 VITALS — BP 120/84 | HR 90 | Ht 65.0 in | Wt 157.0 lb

## 2020-05-18 VITALS — BP 110/70 | HR 92 | Temp 98.0°F | Resp 16 | Ht 65.0 in | Wt 154.6 lb

## 2020-05-18 DIAGNOSIS — I10 Essential (primary) hypertension: Secondary | ICD-10-CM | POA: Diagnosis not present

## 2020-05-18 DIAGNOSIS — Z0000 Encounter for general adult medical examination without abnormal findings: Secondary | ICD-10-CM

## 2020-05-18 DIAGNOSIS — R072 Precordial pain: Secondary | ICD-10-CM | POA: Diagnosis not present

## 2020-05-18 NOTE — Progress Notes (Signed)
Subjective:   David Skinner is a 69 y.o. male who presents for an Initial Medicare Annual Wellness Visit.  Review of Systems     Cardiac Risk Factors include: advanced age (>31mn, >>44women);diabetes mellitus;hypertension;male gender;dyslipidemia     Objective:    Today's Vitals   05/18/20 1152  BP: 110/70  Pulse: 92  Resp: 16  Temp: 98 F (36.7 C)  TempSrc: Oral  SpO2: 96%  Weight: 154 lb 9.6 oz (70.1 kg)  Height: _0  (1.651 m)   Body mass index is 25.73 kg/m.  Advanced Directives 04/10/2017 03/25/2017 03/03/2017 12/09/2016 10/04/2016 05/28/2016 02/27/2016  Does Patient Have a Medical Advance Directive? _1  No No  Would patient like information on creating a medical advance directive? - Yes (MAU/Ambulatory/Procedural Areas - Information given) - - - - -    Current Medications (verified) Outpatient Encounter Medications as of 05/18/2020  Medication Sig   aspirin EC 81 MG tablet Take 1 tablet (81 mg total) by mouth daily.   atorvastatin (LIPITOR) 80 MG tablet TAKE 1 TABLET BY MOUTH EVERY DAY   Blood Glucose Monitoring Suppl (ACCU-CHEK AVIVA PLUS) w/Device KIT 1 kit by Does not apply route daily.   donepezil (ARICEPT) 5 MG tablet Take 5 mg by mouth at bedtime.    DULoxetine (CYMBALTA) 60 MG capsule Take 1 capsule (60 mg total) by mouth daily.   glucose blood (ACCU-CHEK AVIVA PLUS) test strip 1 each by Other route 2 (two) times daily as needed for other. c   ketoconazole (NIZORAL) 2 % shampoo Shampoo scalp 3 times a week, let sit 10 minutes and rinse out   Lancets (ACCU-CHEK SOFT TOUCH) lancets 1 each by Other route 2 (two) times daily as needed for other. DMII   loratadine (CLARITIN) 10 MG tablet Take 1 tablet (10 mg total) by mouth 2 (two) times daily.   losartan (COZAAR) 25 MG tablet Take 1 tablet (25 mg total) by mouth daily.   mometasone (ELOCON) 0.1 % lotion Apply topically as directed. Apply 1-2 gtts to aa itchy scalp qd up to 4 days a week    montelukast (SINGULAIR) 10 MG tablet TAKE 1 TABLET DAILY   olopatadine (PATANOL) 0.1 % ophthalmic solution Place 1 drop into both eyes 2 (two) times daily.   silodosin (RAPAFLO) 8 MG CAPS capsule Take 1 capsule (8 mg total) by mouth daily with breakfast.   traZODone (DESYREL) 100 MG tablet TAKE 1 TABLET AT BEDTIME   Vitamin D, Ergocalciferol, (DRISDOL) 1.25 MG (50000 UNIT) CAPS capsule TAKE 1 CAPSULE EVERY 7 DAYS   albuterol (VENTOLIN HFA) 108 (90 Base) MCG/ACT inhaler TAKE 2 PUFFS BY MOUTH EVERY 6 HOURS AS NEEDED FOR WHEEZE OR SHORTNESS OF BREATH (Patient not taking: Reported on 05/18/2020)   doxycycline (ADOXA) 50 MG tablet Take 1 tablet (50 mg total) by mouth daily. Take with food and drink   tadalafil (CIALIS) 5 MG tablet Take 1 tablet (5 mg total) by mouth daily as needed for erectile dysfunction. (Patient not taking: Reported on 05/18/2020)   [DISCONTINUED] acetaminophen (TYLENOL) 325 MG tablet Take 650 mg by mouth every 6 (six) hours as needed for moderate pain.   [DISCONTINUED] metoprolol tartrate (LOPRESSOR) 100 MG tablet Take 1 tablet (100 mg total) by mouth once for 1 dose. Take 2 hours prior to your CT scan.   No facility-administered encounter medications on file as of 05/18/2020.    Allergies (verified) Ibuprofen, Levofloxacin, Lisinopril, Metoprolol, Nsaids, Tolmetin, Tramadol hcl, and Tylenol [  acetaminophen]   History: Past Medical History:  Diagnosis Date   Allergy    Anxiety    Atopy    Cataract of right eye    [place]   Chronic constipation    Elevated LFTs    Fatty liver    GERD (gastroesophageal reflux disease)    Hyperlipidemia    Hypertension    Hypoglycemia    Insomnia    Memory change    Overweight    Paresthesia of hand    Seizure ([place])    12/12, 09/27/14, 02/28/16   Tenosynovitis of finger    Tinea cruris    Vitamin D deficiency    Past Surgical History:  Procedure Laterality Date   COLONOSCOPY WITH PROPOFOL  N/A 03/25/2017   Procedure: COLONOSCOPY WITH PROPOFOL;  Surgeon: Lin Landsman, MD;  Location: [facility];  Service: Endoscopy;  Laterality: N/A;   ESOPHAGOGASTRODUODENOSCOPY N/A 03/25/2017   Procedure: ESOPHAGOGASTRODUODENOSCOPY (EGD);  Surgeon: Lin Landsman, MD;  Location: [facility];  Service: Endoscopy;  Laterality: N/A;  Diabetic - oral meds   LASIK Bilateral    Family History  Problem Relation Age of Onset   Diabetes Mother    Heart disease Mother    Social History   Socioeconomic History   Marital status: Married    Spouse name: [name]   Number of children: 3   Years of education: Not on file   Highest education level: 12th grade  Occupational History   Occupation: Retired   Tobacco Use   Smoking status: Former Smoker    Packs/day: 1.00    Years: 15.00    Pack years: 15.00    Types: Cigarettes    Start date: 06/17/1984    Quit date: 11/28/1999    Years since quitting: 20.4   Smokeless tobacco: Never Used  Vaping Use   Vaping Use: Never used  Substance and Sexual Activity   Alcohol use: Yes    Alcohol/week: 0.0 standard drinks    Comment: occassional beer   Drug use: No   Sexual activity: Yes    Partners: Female  Other Topics Concern   Not on file  Social History Narrative   Originally from [place]    Social Determinants of Health   Financial Resource Strain: Low Risk    Difficulty of Paying Living Expenses: Not hard at all  Food Insecurity: No Food Insecurity   Worried About Charity fundraiser in the Last Year: Never true   Arboriculturist in the Last Year: Never true  Transportation Needs: No Transportation Needs   Lack of Transportation (Medical): No   Lack of Transportation (Non-Medical): No  Physical Activity: Sufficiently Active   Days of Exercise per Week: 5 days   Minutes of Exercise per Session: 60 min  Stress: No Stress Concern Present   Feeling of Stress : Not at all  Social Connections:  Moderately Integrated   Frequency of Communication with Friends and Family: More than three times a week   Frequency of Social Gatherings with Friends and Family: More than three times a week   Attends Religious Services: 1 to 4 times per year   Active Member of [employer] or Organizations: No   Attends Archivist Meetings: Never   Marital Status: Living with partner    Tobacco Counseling Counseling given: Not Answered   Clinical Intake:  Pre-visit preparation completed: Yes  Pain : No/denies pain     BMI - recorded: 25.73  Nutritional Status: BMI 25 -29 Overweight Nutritional Risks: None Diabetes: Yes CBG done?: No Did pt. bring in CBG monitor from home?: No  How often do you need to have someone help you when you read instructions, pamphlets, or other written materials from your doctor or pharmacy?: 1 - Never  Nutrition Risk Assessment:  Has the patient had any N/V/D within the last 2 months?  No  Does the patient have any non-healing wounds?  No  Has the patient had any unintentional weight loss or weight gain?  No   Diabetes:  Is the patient diabetic?  Yes  If diabetic, was a CBG obtained today?  No  Did the patient bring in their glucometer from home?  No  How often do you monitor your CBG's? Rarely per patient.   Financial Strains and Diabetes Management:  Are you having any financial strains with the device, your supplies or your medication? No .  Does the patient want to be seen by Chronic Care Management for management of their diabetes?  No  Would the patient like to be referred to a Nutritionist or for Diabetic Management?  No   Diabetic Exams:  Diabetic Eye Exam: Completed 06/17/19 negative retinopathy.   Diabetic Foot Exam: Completed 11/09/19.   Interpreter Needed?: Yes Interpreter Agency: Youth worker Name: Reginia Naas Interpreter ID: [id number] Patient Declined Interpreter : No Patient signed [place] waiver: Yes  (patient provided verbal consent)  Information entered by :: Clemetine Marker LPN   Activities of Daily Living In your present state of health, do you have any difficulty performing the following activities: 05/18/2020 03/10/2020  Hearing? N Y  Comment declines hearing aids -  Vision? Y Y  Difficulty concentrating or making decisions? Y N  Comment - -  Walking or climbing stairs? N Y  Dressing or bathing? N N  Doing errands, shopping? N N  Preparing Food and eating ? N -  Using the Toilet? N -  In the past six months, have you accidently leaked urine? Y -  Do you have problems with loss of bowel control? N -  Managing your Medications? N -  Managing your Finances? N -  Housekeeping or managing your Housekeeping? N -  Some recent data might be hidden    Patient Care Team: [name], MD as PCP - General (Family Medicine) Kate Sable, MD as PCP - Cardiology (Cardiology) Vladimir Crofts, MD as Consulting Physician (Neurology) Abbie Sons, MD (Urology) Lin Landsman, MD as Consulting Physician (Gastroenterology)  Indicate any recent Medical Services you may have received from other than [facility] providers in the past year (date may be approximate).     Assessment:   This is a routine wellness examination for [place].  Hearing/Vision screen  Hearing Screening   _0  _1  _2  _3  _4  _5  _6  _7  _8   Right ear:           Left ear:           Comments: Patient denies hearing difficulty   Vision Screening Comments: Annual vision screenings at [place] issues and exercise activities discussed: Current Exercise Habits: Home exercise routine, Type of exercise: Other - see comments (gardening), Time (Minutes): 60, Frequency (Times/Week): 5, Weekly Exercise (Minutes/Week): 300, Intensity: Mild, Exercise limited by: None identified  Goals     DIET - INCREASE WATER INTAKE     Recommend drinking 6-8 glasses of water per day         Depression Screen  PHQ 2/9 Scores 05/18/2020 03/10/2020 11/09/2019 10/08/2019 08/10/2019 06/09/2019 02/09/2019  PHQ - 2 Score 0 1 0 0 0 0 0  PHQ- 9 Score - _0 0 1 -    Fall Risk Fall Risk  05/18/2020 03/10/2020 11/09/2019 10/08/2019 08/10/2019  Falls in the past year? 0 0 0 0 0  Number falls in past yr: 0 0 - 0 0  Injury with Fall? 0 0 - 0 0  Risk for fall due to : History of fall(s) - - - -  Follow up Falls prevention discussed - - - Falls evaluation completed    Any stairs in or around the home? Yes  If so, are there any without handrails? Yes  Home free of loose throw rugs in walkways, pet beds, electrical cords, etc? Yes  Adequate lighting in your home to reduce risk of falls? Yes   ASSISTIVE DEVICES UTILIZED TO PREVENT FALLS:  Life alert? No  Use of a cane, walker or w/c? No  Grab bars in the bathroom? No  Shower chair or bench in shower? Yes  Elevated toilet seat or a handicapped toilet? No   TIMED UP AND GO:  Was the test performed? Yes .  Length of time to ambulate 10 feet: 4 sec.   Gait steady and fast without use of assistive device  Cognitive Function:     6CIT Screen 05/18/2020  What Year? 0 points  What month? 0 points  What time? 0 points  Count back from 20 0 points  Months in reverse 0 points  Repeat phrase 6 points  Total Score 6    Immunizations Immunization History  Administered Date(s) Administered   Fluad Quad(high Dose 65+) 02/09/2019, 03/10/2020   Hepatitis A 02/25/2013, 11/12/2013   Hepatitis A, Adult 03/24/2017   Hepatitis B 02/25/2013, 11/12/2013   Hepatitis B, adult 11/28/2014, 03/24/2017   Influenza, High Dose Seasonal PF 04/08/2018   Influenza,inj,Quad PF,6+ Mos 03/08/2015, 05/28/2016, 04/10/2017   Influenza-Unspecified 02/25/2013, 03/01/2014   Moderna SARS-COVID-2 Vaccination 07/30/2019, 08/27/2019, 04/19/2020   Pneumococcal Conjugate-13 04/08/2018   Pneumococcal Polysaccharide-23 03/21/2015   Tdap 12/02/2012   Zoster  03/08/2015    TDAP status: Up to date   Flu Vaccine status: Up to date   Pneumococcal vaccine status: Up to date   Covid-19 vaccine status: Completed vaccines  Qualifies for Shingles Vaccine? Yes   Zostavax completed Yes   Shingrix Completed?: No.    Education has been provided regarding the importance of this vaccine. Patient has been advised to call insurance company to determine out of pocket expense if they have not yet received this vaccine. Advised may also receive vaccine at local pharmacy or Health Dept. Verbalized acceptance and understanding.  Screening Tests Health Maintenance  Topic Date Due   PNA vac Low Risk Adult (2 of 2 - PPSV23) 03/20/2020   OPHTHALMOLOGY EXAM  06/16/2020   HEMOGLOBIN A1C  09/07/2020   FOOT EXAM  11/08/2020   COLONOSCOPY  03/25/2022   TETANUS/TDAP  12/03/2022   INFLUENZA VACCINE  Completed   COVID-19 Vaccine  Completed   Hepatitis C Screening  Completed    Health Maintenance  Health Maintenance Due  Topic Date Due   PNA vac Low Risk Adult (2 of 2 - PPSV23) 03/20/2020    Colorectal cancer screening: Completed 03/25/17. Repeat every 5 years  Lung Cancer Screening: (Low Dose CT Chest recommended if Age 58-80 years, 30 pack-year currently smoking OR have quit w/in 15years.) does not qualify.   Additional Screening:  Hepatitis C Screening: does qualify; Completed 03/17/17  Vision Screening: Recommended annual ophthalmology exams for early detection of glaucoma and other disorders of the eye. Is the patient up to date with their annual eye exam?  Yes  Who is the provider or what is the name of the office in which the patient attends annual eye exams? Reading Screening: Recommended annual dental exams for proper oral hygiene  Community Resource Referral / Chronic Care Management: CRR required this visit?  No   CCM required this visit?  No      Plan:     I have personally reviewed and noted the following  in the patients chart:    Medical and social history  Use of alcohol, tobacco or illicit drugs   Current medications and supplements  Functional ability and status  Nutritional status  Physical activity  Advanced directives  List of other physicians  Hospitalizations, surgeries, and ER visits in previous 12 months  Vitals  Screenings to include cognitive, depression, and falls  Referrals and appointments  In addition, I have reviewed and discussed with patient certain preventive protocols, quality metrics, and best practice recommendations. A written personalized care plan for preventive services as well as general preventive health recommendations were provided to patient.     Clemetine Marker, LPN   [id number]/[id number]   Nurse Notes: Patient states he needs refills on some of his prescriptions. Pt advised to contact pharmacy to send refill request to prescribing providers.

## 2020-05-18 NOTE — Progress Notes (Signed)
Cardiology Office Note:    Date:  05/18/2020   ID:  David, Skinner Sep 19, 1950, MRN [id number]  PCP:  David Sizer, MD  Cardiologist:  David Sable, MD  Electrophysiologist:  None   Referring MD: David Sizer, MD   Chief Complaint  Patient presents with  . Follow-up    Review CT results. and c/o sometimes feeling tired. Medications verbally reviewed with patient.     History of Present Illness:   David Skinner interpreter used for this encounter.  David Skinner is a 69 y.o. male with a hx of hypertension, hyperlipidemia, former smoker x10 to 15 years who presents for follow-up.  Previously seen for chest pain consistent with angina.  Echocardiogram showed normal ejection fraction, Lexiscan Myoview with no evidence for ischemia.  His symptoms of chest pain persisted, coronary CTA was ordered.  He now presents for results.  States sometimes feels tired but denies chest pain, saw pulmonary medicine regarding asthma.  Symptoms of chest discomfort have resolved.   Past Medical History:  Diagnosis Date  . Allergy   . Anxiety   . Atopy   . Cataract of right eye    [facility]  . Chronic constipation   . Elevated LFTs   . Fatty liver   . GERD (gastroesophageal reflux disease)   . Hyperlipidemia   . Hypertension   . Hypoglycemia   . Insomnia   . Memory change   . Overweight   . Paresthesia of hand   . Seizure ([facility])    12/12, 09/27/14, 02/28/16  . Tenosynovitis of finger   . Tinea cruris   . Vitamin D deficiency     Past Surgical History:  Procedure Laterality Date  . COLONOSCOPY WITH PROPOFOL N/A 03/25/2017   Procedure: COLONOSCOPY WITH PROPOFOL;  Surgeon: David Landsman, MD;  Location: [facility];  Service: Endoscopy;  Laterality: N/A;  . ESOPHAGOGASTRODUODENOSCOPY N/A 03/25/2017   Procedure: ESOPHAGOGASTRODUODENOSCOPY (EGD);  Surgeon: David Landsman, MD;  Location: [facility];  Service: Endoscopy;  Laterality: N/A;  Diabetic  - oral meds  . LASIK Bilateral     Current Medications: Current Meds  Medication Sig  . acetaminophen (TYLENOL) 325 MG tablet Take 650 mg by mouth every 6 (six) hours as needed for moderate pain.  Marland Kitchen albuterol (VENTOLIN HFA) 108 (90 Base) MCG/ACT inhaler TAKE 2 PUFFS BY MOUTH EVERY 6 HOURS AS NEEDED FOR WHEEZE OR SHORTNESS OF BREATH  . aspirin EC 81 MG tablet Take 1 tablet (81 mg total) by mouth daily.  Marland Kitchen atorvastatin (LIPITOR) 80 MG tablet TAKE 1 TABLET BY MOUTH EVERY DAY  . Blood Glucose Monitoring Suppl (ACCU-CHEK AVIVA PLUS) w/Device KIT 1 kit by Does not apply route daily.  Marland Kitchen donepezil (ARICEPT) 5 MG tablet Take 5 mg by mouth at bedtime.   Marland Kitchen doxycycline (ADOXA) 50 MG tablet Take 1 tablet (50 mg total) by mouth daily. Take with food and drink  . DULoxetine (CYMBALTA) 60 MG capsule Take 1 capsule (60 mg total) by mouth daily.  Marland Kitchen glucose blood (ACCU-CHEK AVIVA PLUS) test strip 1 each by Other route 2 (two) times daily as needed for other. c  . ketoconazole (NIZORAL) 2 % shampoo Shampoo scalp 3 times a week, let sit 10 minutes and rinse out  . Lancets (ACCU-CHEK SOFT TOUCH) lancets 1 each by Other route 2 (two) times daily as needed for other. DMII  . loratadine (CLARITIN) 10 MG tablet Take 1 tablet (10 mg total) by mouth 2 (two)  times daily.  Marland Kitchen losartan (COZAAR) 25 MG tablet Take 1 tablet (25 mg total) by mouth daily.  . mometasone (ELOCON) 0.1 % lotion Apply topically as directed. Apply 1-2 gtts to aa itchy scalp qd up to 4 days a week  . montelukast (SINGULAIR) 10 MG tablet TAKE 1 TABLET DAILY  . olopatadine (PATANOL) 0.1 % ophthalmic solution Place 1 drop into both eyes 2 (two) times daily.  . silodosin (RAPAFLO) 8 MG CAPS capsule Take 1 capsule (8 mg total) by mouth daily with breakfast.  . tadalafil (CIALIS) 5 MG tablet Take 1 tablet (5 mg total) by mouth daily as needed for erectile dysfunction.  . traZODone (DESYREL) 100 MG tablet TAKE 1 TABLET AT BEDTIME  . Vitamin D,  Ergocalciferol, (DRISDOL) 1.25 MG (50000 UNIT) CAPS capsule TAKE 1 CAPSULE EVERY 7 DAYS     Allergies:   Ibuprofen, Levofloxacin, Lisinopril, Metoprolol, Nsaids, Tolmetin, and Tramadol hcl   Social History   Socioeconomic History  . Marital status: Married    Spouse name: David Skinner  . Number of children: 3  . Years of education: Not on file  . Highest education level: 12th grade  Occupational History  . Occupation: Retired   Tobacco Use  . Smoking status: Former Smoker    Packs/day: 1.00    Years: 15.00    Pack years: 15.00    Types: Cigarettes    Start date: 06/17/1984    Quit date: 11/28/1999    Years since quitting: 20.4  . Smokeless tobacco: Never Used  Vaping Use  . Vaping Use: Never used  Substance and Sexual Activity  . Alcohol use: Yes    Alcohol/week: 0.0 standard drinks    Comment: occassional beer  . Drug use: No  . Sexual activity: Yes    Partners: Female  Other Topics Concern  . Not on file  Social History Narrative   Originally from [place]    Social Determinants of Health   Financial Resource Strain:   . Difficulty of Paying Living Expenses: Not on file  Food Insecurity:   . Worried About Charity fundraiser in the Last Year: Not on file  . Ran Out of Food in the Last Year: Not on file  Transportation Needs:   . Lack of Transportation (Medical): Not on file  . Lack of Transportation (Non-Medical): Not on file  Physical Activity:   . Days of Exercise per Week: Not on file  . Minutes of Exercise per Session: Not on file  Stress:   . Feeling of Stress : Not on file  Social Connections:   . Frequency of Communication with Friends and Family: Not on file  . Frequency of Social Gatherings with Friends and Family: Not on file  . Attends Religious Services: Not on file  . Active Member of Clubs or Organizations: Not on file  . Attends Archivist Meetings: Not on file  . Marital Status: Not on file     Family History: The patient's family  history includes Diabetes in his mother; Heart disease in his mother.  ROS:   Please see the history of present illness.     All other systems reviewed and are negative.  EKGs/Labs/Other Studies Reviewed:    The following studies were reviewed today:   EKG:  EKG not ordered today.   Recent Labs: 03/10/2020: ALT 55; BUN 10; Hemoglobin 17.3; Platelets 258; Potassium 4.6; Sodium 139 05/04/2020: Creatinine, Ser 1.10  Recent Lipid Panel    Component Value  Date/Time   CHOL 130 03/10/2020 1212   CHOL 183 03/08/2015 1215   TRIG 80 03/10/2020 1212   HDL 55 03/10/2020 1212   HDL 51 03/08/2015 1215   CHOLHDL 2.4 03/10/2020 1212   VLDL 17 05/28/2016 1041   LDLCALC 59 03/10/2020 1212    Physical Exam:    VS:  BP 120/84 (BP Location: Left Arm, Patient Position: Sitting, Cuff Size: Normal)   Pulse 90   Ht _0  (1.651 m)   Wt 157 lb (71.2 kg)   SpO2 96%   BMI 26.13 kg/m     Wt Readings from Last 3 Encounters:  05/18/20 157 lb (71.2 kg)  04/20/20 157 lb 12.8 oz (71.6 kg)  03/10/20 154 lb 14.4 oz (70.3 kg)     GEN:  Well nourished, well developed in no acute distress HEENT: Normal NECK: No JVD; No carotid bruits LYMPHATICS: No lymphadenopathy CARDIAC: RRR, no murmurs, rubs, gallops RESPIRATORY:  Clear to auscultation without rales, wheezing or rhonchi  ABDOMEN: Soft, non-tender, non-distended MUSCULOSKELETAL:  No edema; No deformity  SKIN: Warm and dry NEUROLOGIC:  Alert and oriented x 3 PSYCHIATRIC:  Normal affect   ASSESSMENT:    1. Precordial pain   2. Essential hypertension    PLAN:    In order of problems listed above:  1. Previous chest pain consistent with angina now resolved.  He has risk factors of hypertension, hyperlipidemia, former smoker.  Echocardiogram showed normal systolic function, impaired relaxation, EF 60 to 65%.  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  Continue aspirin and Lipitor as  prescribed.  2. History of hypertension, blood pressure well controlled.  Continue losartan   Follow-up in 12 months  Total encounter 42 minutes  Greater than 50% was spent in counseling and coordination of care with the patient Time spent answering patient's questions, explaining test results, usage of interpreter services.  This note was generated in part or whole with voice recognition software. Voice recognition is usually quite accurate but there are transcription errors that can and very often do occur. I apologize for any typographical errors that were not detected and corrected.  Medication Adjustments/Labs and Tests Ordered: Current medicines are reviewed at length with the patient today.  Concerns regarding medicines are outlined above.  No orders of the defined types were placed in this encounter.  No orders of the defined types were placed in this encounter.   Patient Instructions  Medication Instructions:  Your physician recommends that you continue on your current medications as directed. Please refer to the Current Medication list given to you today.  *If you need a refill on your cardiac medications before your next appointment, please call your pharmacy*   Lab Work: None Ordered If you have labs (blood work) drawn today and your tests are completely normal, you will receive your results only by: Marland Kitchen MyChart Message (if you have MyChart) OR . A paper copy in the mail If you have any lab test that is abnormal or we need to change your treatment, we will call you to review the results.   Testing/Procedures: None Ordered   Follow-Up: At [facility], you and your health needs are our priority.  As part of our continuing mission to provide you with exceptional heart care, we have created designated Provider Care Teams.  These Care Teams include your primary Cardiologist (physician) and Advanced Practice Providers (APPs -  Physician Assistants and Nurse  Practitioners) who all work together to provide you with the care you need, when you need it.  We recommend signing up for the patient portal called "MyChart".  Sign up information is provided on this After Visit Summary.  MyChart is used to connect with patients for Virtual Visits (Telemedicine).  Patients are able to view lab/test results, encounter notes, upcoming appointments, etc.  Non-urgent messages can be sent to your provider as well.   To learn more about what you can do with MyChart, go to [email or web address].    Your next appointment:   1 year(s)  The format for your next appointment:   In Person  Provider:   Kate Sable, MD   Other Instructions       Signed, David Sable, MD  05/18/2020 11:27 AM    [place]

## 2020-05-18 NOTE — Patient Instructions (Addendum)
David Skinner , Thank you for taking time to come for your Medicare Wellness Visit. I appreciate your ongoing commitment to your health goals. Please review the following plan we discussed and let me know if I can assist you in the future.   Screening recommendations/referrals: Colonoscopy: done 03/25/17 Recommended yearly ophthalmology/optometry visit for glaucoma screening and checkup Recommended yearly dental visit for hygiene and checkup  Vaccinations: Influenza vaccine: done 03/10/20 Pneumococcal vaccine: done 04/08/18 Tdap vaccine: done 12/02/12 Shingles vaccine: Shingrix discussed. Please contact your pharmacy for coverage information.     Covid-19: done 07/30/19, 08/27/19 & 04/19/20  Conditions/risks identified: Recommend drinking 6-8 glasses of water per day   Next appointment: Follow up in one year for your annual wellness visit.   Preventive Care 28 Years and Older, Male Preventive care refers to lifestyle choices and visits with your health care provider that can promote health and wellness. What does preventive care include?  A yearly physical exam. This is also called an annual well check.  Dental exams once or twice a year.  Routine eye exams. Ask your health care provider how often you should have your eyes checked.  Personal lifestyle choices, including:  Daily care of your teeth and gums.  Regular physical activity.  Eating a healthy diet.  Avoiding tobacco and drug use.  Limiting alcohol use.  Practicing safe sex.  Taking low doses of aspirin every day.  Taking vitamin and mineral supplements as recommended by your health care provider. What happens during an annual well check? The services and screenings done by your health care provider during your annual well check will depend on your age, overall health, lifestyle risk factors, and family history of disease. Counseling  Your health care provider may ask you questions about your:  Alcohol  use.  Tobacco use.  Drug use.  Emotional well-being.  Home and relationship well-being.  Sexual activity.  Eating habits.  History of falls.  Memory and ability to understand (cognition).  Work and work Statistician. Screening  You may have the following tests or measurements:  Height, weight, and BMI.  Blood pressure.  Lipid and cholesterol levels. These may be checked every 5 years, or more frequently if you are over 53 years old.  Skin check.  Lung cancer screening. You may have this screening every year starting at age 68 if you have a 30-pack-year history of smoking and currently smoke or have quit within the past 15 years.  Fecal occult blood test (FOBT) of the stool. You may have this test every year starting at age 66.  Flexible sigmoidoscopy or colonoscopy. You may have a sigmoidoscopy every 5 years or a colonoscopy every 10 years starting at age 63.  Prostate cancer screening. Recommendations will vary depending on your family history and other risks.  Hepatitis C blood test.  Hepatitis B blood test.  Sexually transmitted disease (STD) testing.  Diabetes screening. This is done by checking your blood sugar (glucose) after you have not eaten for a while (fasting). You may have this done every 1-3 years.  Abdominal aortic aneurysm (AAA) screening. You may need this if you are a current or former smoker.  Osteoporosis. You may be screened starting at age 77 if you are at high risk. Talk with your health care provider about your test results, treatment options, and if necessary, the need for more tests. Vaccines  Your health care provider may recommend certain vaccines, such as:  Influenza vaccine. This is recommended every year.  Tetanus,  diphtheria, and acellular pertussis (Tdap, Td) vaccine. You may need a Td booster every 10 years.  Zoster vaccine. You may need this after age 32.  Pneumococcal 13-valent conjugate (PCV13) vaccine. One dose is  recommended after age 83.  Pneumococcal polysaccharide (PPSV23) vaccine. One dose is recommended after age 62. Talk to your health care provider about which screenings and vaccines you need and how often you need them. This information is not intended to replace advice given to you by your health care provider. Make sure you discuss any questions you have with your health care provider. Document Released: 06/30/2015 Document Revised: 02/21/2016 Document Reviewed: 04/04/2015 Elsevier Interactive Patient Education  2017 [place] Prevention in the Home Falls can cause injuries. They can happen to people of all ages. There are many things you can do to make your home safe and to help prevent falls. What can I do on the outside of my home?  Regularly fix the edges of walkways and driveways and fix any cracks.  Remove anything that might make you trip as you walk through a door, such as a raised step or threshold.  Trim any bushes or trees on the path to your home.  Use bright outdoor lighting.  Clear any walking paths of anything that might make someone trip, such as rocks or tools.  Regularly check to see if handrails are loose or broken. Make sure that both sides of any steps have handrails.  Any raised decks and porches should have guardrails on the edges.  Have any leaves, snow, or ice cleared regularly.  Use sand or salt on walking paths during winter.  Clean up any spills in your garage right away. This includes oil or grease spills. What can I do in the bathroom?  Use night lights.  Install grab bars by the toilet and in the tub and shower. Do not use towel bars as grab bars.  Use non-skid mats or decals in the tub or shower.  If you need to sit down in the shower, use a plastic, non-slip stool.  Keep the floor dry. Clean up any water that spills on the floor as soon as it happens.  Remove soap buildup in the tub or shower regularly.  Attach bath mats  securely with double-sided non-slip rug tape.  Do not have throw rugs and other things on the floor that can make you trip. What can I do in the bedroom?  Use night lights.  Make sure that you have a light by your bed that is easy to reach.  Do not use any sheets or blankets that are too big for your bed. They should not hang down onto the floor.  Have a firm chair that has side arms. You can use this for support while you get dressed.  Do not have throw rugs and other things on the floor that can make you trip. What can I do in the kitchen?  Clean up any spills right away.  Avoid walking on wet floors.  Keep items that you use a lot in easy-to-reach places.  If you need to reach something above you, use a strong step stool that has a grab bar.  Keep electrical cords out of the way.  Do not use floor polish or wax that makes floors slippery. If you must use wax, use non-skid floor wax.  Do not have throw rugs and other things on the floor that can make you trip. What can I do with  my stairs?  Do not leave any items on the stairs.  Make sure that there are handrails on both sides of the stairs and use them. Fix handrails that are broken or loose. Make sure that handrails are as long as the stairways.  Check any carpeting to make sure that it is firmly attached to the stairs. Fix any carpet that is loose or worn.  Avoid having throw rugs at the top or bottom of the stairs. If you do have throw rugs, attach them to the floor with carpet tape.  Make sure that you have a light switch at the top of the stairs and the bottom of the stairs. If you do not have them, ask someone to add them for you. What else can I do to help prevent falls?  Wear shoes that:  Do not have high heels.  Have rubber bottoms.  Are comfortable and fit you well.  Are closed at the toe. Do not wear sandals.  If you use a stepladder:  Make sure that it is fully opened. Do not climb a closed  stepladder.  Make sure that both sides of the stepladder are locked into place.  Ask someone to hold it for you, if possible.  Clearly mark and make sure that you can see:  Any grab bars or handrails.  First and last steps.  Where the edge of each step is.  Use tools that help you move around (mobility aids) if they are needed. These include:  Canes.  Walkers.  Scooters.  Crutches.  Turn on the lights when you go into a dark area. Replace any light bulbs as soon as they burn out.  Set up your furniture so you have a clear path. Avoid moving your furniture around.  If any of your floors are uneven, fix them.  If there are any pets around you, be aware of where they are.  Review your medicines with your doctor. Some medicines can make you feel dizzy. This can increase your chance of falling. Ask your doctor what other things that you can do to help prevent falls. This information is not intended to replace advice given to you by your health care provider. Make sure you discuss any questions you have with your health care provider. Document Released: 03/30/2009 Document Revised: 11/09/2015 Document Reviewed: 07/08/2014 Elsevier Interactive Patient Education  2017 [employer].

## 2020-05-18 NOTE — Patient Instructions (Signed)

## 2020-05-19 ENCOUNTER — Ambulatory Visit (INDEPENDENT_AMBULATORY_CARE_PROVIDER_SITE_OTHER): Payer: Medicare Other | Admitting: Urology

## 2020-05-19 ENCOUNTER — Encounter: Payer: Self-pay | Admitting: Urology

## 2020-05-19 DIAGNOSIS — N529 Male erectile dysfunction, unspecified: Secondary | ICD-10-CM

## 2020-05-19 MED ORDER — TADALAFIL 20 MG PO TABS: ORAL_TABLET | ORAL | 3 refills | Status: DC

## 2020-05-19 MED ORDER — SILODOSIN 8 MG PO CAPS
8.0000 mg | ORAL_CAPSULE | Freq: Every day | ORAL | 11 refills | Status: DC
Start: 2020-05-19 — End: 2021-11-07

## 2020-05-19 NOTE — Progress Notes (Signed)
05/19/2020 12:58 PM   Nael Rockie Neighbours Nylen [birth year] [id number]  Referring provider: [name], MD [address] [place],  [place] [address]  Chief Complaint  Patient presents with  . Medication Refill    HPI: 69 y.o. male who recently requested an appointment for "personal problems".  He presents with a [place] interpreter.   Previously seen for BPH with LUTS  On silodosin and states symptoms are stable  Has previously been on tadalafil 5 mg for ED which was partially effective; was taking the medicine prn  Has difficulty achieving and maintaining an erection  No oral or sublingual nitrates  Organic risk factors include hypertension, hyperlipidemia, antihypertensive medications and prior tobacco history   PMH: Past Medical History:  Diagnosis Date  . Allergy   . Anxiety   . Atopy   . Cataract of right eye    [facility]  . Chronic constipation   . Elevated LFTs   . Fatty liver   . GERD (gastroesophageal reflux disease)   . Hyperlipidemia   . Hypertension   . Hypoglycemia   . Insomnia   . Memory change   . Overweight   . Paresthesia of hand   . Seizure ([place])    12/12, 09/27/14, 02/28/16  . Tenosynovitis of finger   . Tinea cruris   . Vitamin D deficiency     Surgical History: Past Surgical History:  Procedure Laterality Date  . COLONOSCOPY WITH PROPOFOL N/A 03/25/2017   Procedure: COLONOSCOPY WITH PROPOFOL;  Surgeon: Lin Landsman, MD;  Location: [facility];  Service: Endoscopy;  Laterality: N/A;  . ESOPHAGOGASTRODUODENOSCOPY N/A 03/25/2017   Procedure: ESOPHAGOGASTRODUODENOSCOPY (EGD);  Surgeon: Lin Landsman, MD;  Location: [facility];  Service: Endoscopy;  Laterality: N/A;  Diabetic - oral meds  . LASIK Bilateral     Home Medications:  Allergies as of 05/19/2020      Reactions   Ibuprofen Itching   Levofloxacin    Lisinopril Cough   Metoprolol    Nsaids Itching   Tolmetin Itching    Tramadol Hcl Itching   Tylenol [acetaminophen] Itching      Medication List       Accurate as of May 19, 2020 12:58 PM. If you have any questions, ask your nurse or doctor.        Accu-Chek Aviva Plus w/Device Kit 1 kit by Does not apply route daily.   accu-chek soft touch lancets 1 each by Other route 2 (two) times daily as needed for other. DMII   albuterol 108 (90 Base) MCG/ACT inhaler Commonly known as: VENTOLIN HFA TAKE 2 PUFFS BY MOUTH EVERY 6 HOURS AS NEEDED FOR WHEEZE OR SHORTNESS OF BREATH   aspirin EC 81 MG tablet Take 1 tablet (81 mg total) by mouth daily.   atorvastatin 80 MG tablet Commonly known as: LIPITOR TAKE 1 TABLET BY MOUTH EVERY DAY   donepezil 5 MG tablet Commonly known as: ARICEPT Take 5 mg by mouth at bedtime.   doxycycline 50 MG tablet Commonly known as: ADOXA Take 1 tablet (50 mg total) by mouth daily. Take with food and drink   DULoxetine 60 MG capsule Commonly known as: CYMBALTA Take 1 capsule (60 mg total) by mouth daily.   glucose blood test strip Commonly known as: Accu-Chek Aviva Plus 1 each by Other route 2 (two) times daily as needed for other. c   ketoconazole 2 % shampoo Commonly known as: NIZORAL Shampoo scalp 3 times a week, let  sit 10 minutes and rinse out   loratadine 10 MG tablet Commonly known as: CLARITIN Take 1 tablet (10 mg total) by mouth 2 (two) times daily.   losartan 25 MG tablet Commonly known as: COZAAR Take 1 tablet (25 mg total) by mouth daily.   mometasone 0.1 % lotion Commonly known as: ELOCON Apply topically as directed. Apply 1-2 gtts to aa itchy scalp qd up to 4 days a week   montelukast 10 MG tablet Commonly known as: SINGULAIR TAKE 1 TABLET DAILY   olopatadine 0.1 % ophthalmic solution Commonly known as: Patanol Place 1 drop into both eyes 2 (two) times daily.   silodosin 8 MG Caps capsule Commonly known as: RAPAFLO Take 1 capsule (8 mg total) by mouth daily with breakfast.     tadalafil 5 MG tablet Commonly known as: Cialis Take 1 tablet (5 mg total) by mouth daily as needed for erectile dysfunction.   traZODone 100 MG tablet Commonly known as: DESYREL TAKE 1 TABLET AT BEDTIME   Vitamin D (Ergocalciferol) 1.25 MG (50000 UNIT) Caps capsule Commonly known as: DRISDOL TAKE 1 CAPSULE EVERY 7 DAYS       Allergies:  Allergies  Allergen Reactions  . Ibuprofen Itching  . Levofloxacin   . Lisinopril Cough  . Metoprolol   . Nsaids Itching  . Tolmetin Itching  . Tramadol Hcl Itching  . Tylenol [Acetaminophen] Itching    Family History: Family History  Problem Relation Age of Onset  . Diabetes Mother   . Heart disease Mother     Social History:  reports that he quit smoking about 20 years ago. His smoking use included cigarettes. He started smoking about 35 years ago. He has a 15.00 pack-year smoking history. He has never used smokeless tobacco. He reports current alcohol use. He reports that he does not use drugs.   Physical Exam: BP 124/76   Pulse (!) 110   Ht $R'5\' 5"'ye$  (1.651 m)   Wt 159 lb (72.1 kg)   BMI 26.46 kg/m   Constitutional:  Alert and oriented, No acute distress. HEENT: [place] AT, moist mucus membranes.  Trachea midline, no masses. Cardiovascular: No clubbing, cyanosis, or edema. Respiratory: Normal respiratory effort, no increased work of breathing.   Assessment & Plan:    1.  Erectile dysfunction  Rx tadalafil 20 mg sent to pharmacy  2.  BPH with LUTS  Requested refill silodosin   Abbie Sons, MD  [facility] [address] [place],  [address] [phone]

## 2020-07-01 DIAGNOSIS — Z20822 Contact with and (suspected) exposure to covid-19: Secondary | ICD-10-CM | POA: Diagnosis not present

## 2020-08-03 ENCOUNTER — Ambulatory Visit: Payer: Medicare Other | Admitting: Dermatology

## 2020-08-07 DIAGNOSIS — K529 Noninfective gastroenteritis and colitis, unspecified: Secondary | ICD-10-CM | POA: Diagnosis not present

## 2020-08-31 ENCOUNTER — Other Ambulatory Visit: Payer: Self-pay

## 2020-09-04 ENCOUNTER — Other Ambulatory Visit: Payer: Self-pay

## 2020-09-06 NOTE — Progress Notes (Deleted)
Name: David Skinner   MRN: [id number]    DOB: 15-Jun-1951   Date:09/06/2020       Progress Note  Subjective  Chief Complaint  Medication Review/Refill  HPI  Diarrhea: started about 3 weeks ago with bloating followed by increase in bowel movements about 6 times per day, associated with mild  cramping but no blood in stools . He denies change in diet or medication prior to the symptoms. No sick contacts. No fever or chills. He has lack of appetite and has lost 5 lbs . He was given doxy by Dr. Nehemiah Massed, but he is not sure if currently taking it . He showed me a picture on his phone of a bottle of Miralax, he states he feels better when he takes it, explained that Miralax causes diarrhea, he can try Maalox or petobismol instead   Major depression recurrent :going on for years butworsein2018, he is on Cymbalta since April 2018, he stateshe is still working around the house and was doing well, but since he has not been feeling well he has noticed lack of energy and motivation. He is stable and has been compliant with medication   Hyperlipidemia :he is taking Atorvastatin 80 mg dailyNo myalgia, chest pain has resolved   OSA/minimal/RLS: he sees Dr Manuella Ghazi for memory loss and had sleep study2018it showed minimal sleep apnea and RLS -seeing Dr. Manuella Ghazi.Hestates that when he takes Trazodone he sleeps well but if he skips a dose he has difficulty falling asleep. Unchanged   HTN: he was off medication, but we resumed it on his last visit, BP is down again, we will adjust dose to 25 mg , continue current medication, no chest pain, palpitation or sob   Alcoholism : he used to drink heavy while in [place] used to drink liquor at least 3 times a week and used to get drunk, when he moved to [place] ( 1979). He was still drinking better but quit in2017.He states he started drinking alcohol again a couple of months ago, he drinks beer and liquor 2-3 shots about once a month and during vacations he  drinks daily.   Urinary frequency: seen by Urologistand states medication is working well for him now. Unchanged . Keep follow up with Urologist   Partial Seizure: Last episode 02/2016 and went to [facility],taking medication daily now and isseeing Dr. Manuella Ghazi. No recent episodes, explained importance of quitting drinking again .  Based on Dr. Genoveva Ill note he was supposed to stop Keppra and take Lamictal but he has not been taking it because it made him feel tired. 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  A1C has been stable and ttoday is 6.0 %, he  has denies polyphagia or polydipsia, has urinary frequency due to LUTS  Continues statin therapy. Advised to quit drinking again    Patient Active Problem List   Diagnosis Date Noted  . Alcoholism ([place]) 03/10/2020  . OSA (obstructive sleep apnea) 12/09/2016  . RLS (restless legs syndrome) 12/09/2016  . Episode of moderate major depression ([place]) 12/09/2016  . History of alcoholism ([place]) 07/22/2016  . Cervical radiculitis 01/09/2016  . Lumbosacral radiculitis 01/09/2016  . Osteoarthritis of carpometacarpal (CMC) joint of thumb 01/09/2016  . Tinea cruris 03/21/2015  . 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 Depression with  anxiety 11/28/2014  . Chronic constipation 11/28/2014  . Insomnia 11/28/2014  . Dyslipidemia 11/28/2014  . Fatty infiltration of liver 11/28/2014  . Essential (primary) hypertension 11/28/2014  . Gastric reflux 11/28/2014  . Memory loss or impairment 11/28/2014  . Perennial allergic rhinitis with seasonal variation 11/28/2014  . Vitamin D deficiency 11/28/2014  . Tenosynovitis of thumb 11/28/2014  . Partial epilepsy with impairment of consciousness ([place]) 11/16/2014    Past Surgical History:  Procedure Laterality Date  .  COLONOSCOPY WITH PROPOFOL N/A 03/25/2017   Procedure: COLONOSCOPY WITH PROPOFOL;  Surgeon: Lin Landsman, MD;  Location: [facility];  Service: Endoscopy;  Laterality: N/A;  . ESOPHAGOGASTRODUODENOSCOPY N/A 03/25/2017   Procedure: ESOPHAGOGASTRODUODENOSCOPY (EGD);  Surgeon: Lin Landsman, MD;  Location: [facility];  Service: Endoscopy;  Laterality: N/A;  Diabetic - oral meds  . LASIK Bilateral     Family History  Problem Relation Age of Onset  . Diabetes Mother   . Heart disease Mother     Social History   Tobacco Use  . Smoking status: Former Smoker    Packs/day: 1.00    Years: 15.00    Pack years: 15.00    Types: Cigarettes    Start date: 06/17/1984    Quit date: 11/28/1999    Years since quitting: 20.7  . Smokeless tobacco: Never Used  Substance Use Topics  . Alcohol use: Yes    Alcohol/week: 0.0 standard drinks    Comment: occassional beer     Current Outpatient Medications:  .  albuterol (VENTOLIN HFA) 108 (90 Base) MCG/ACT inhaler, TAKE 2 PUFFS BY MOUTH EVERY 6 HOURS AS NEEDED FOR WHEEZE OR SHORTNESS OF BREATH, Disp: 18 g, Rfl: 0 .  aspirin EC 81 MG tablet, Take 1 tablet (81 mg total) by mouth daily., Disp: 30 tablet, Rfl: 0 .  atorvastatin (LIPITOR) 80 MG tablet, TAKE 1 TABLET BY MOUTH EVERY DAY, Disp: 90 tablet, Rfl: 3 .  Blood Glucose Monitoring Suppl (ACCU-CHEK AVIVA PLUS) w/Device KIT, 1 kit by Does not apply route daily., Disp: 1 kit, Rfl: 0 .  donepezil (ARICEPT) 5 MG tablet, Take 5 mg by mouth at bedtime. , Disp: , Rfl:  .  doxycycline (ADOXA) 50 MG tablet, Take 1 tablet (50 mg total) by mouth daily. Take with food and drink, Disp: 30 tablet, Rfl: 6 .  DULoxetine (CYMBALTA) 60 MG capsule, Take 1 capsule (60 mg total) by mouth daily., Disp: 90 capsule, Rfl: 1 .  glucose blood (ACCU-CHEK AVIVA PLUS) test strip, 1 each by Other route 2 (two) times daily as needed for other. c, Disp: 100 each, Rfl: 3 .  ketoconazole (NIZORAL) 2 % shampoo,  Shampoo scalp 3 times a week, let sit 10 minutes and rinse out, Disp: 120 mL, Rfl: 6 .  Lancets (ACCU-CHEK SOFT TOUCH) lancets, 1 each by Other route 2 (two) times daily as needed for other. DMII, Disp: 100 each, Rfl: 3 .  loratadine (CLARITIN) 10 MG tablet, Take 1 tablet (10 mg total) by mouth 2 (two) times daily., Disp: 180 tablet, Rfl: 1 .  losartan (COZAAR) 25 MG tablet, Take 1 tablet (25 mg total) by mouth daily., Disp: 90 tablet, Rfl: 1 .  mometasone (ELOCON) 0.1 % lotion, Apply topically as directed. Apply 1-2 gtts to aa itchy scalp qd up to 4 days a week, Disp: 60 mL, Rfl: 0 .  montelukast (SINGULAIR) 10 MG tablet, TAKE 1 TABLET DAILY, Disp: 90 tablet, Rfl: 2 .  olopatadine (PATANOL) 0.1 %  ophthalmic solution, Place 1 drop into both eyes 2 (two) times daily., Disp: 5 mL, Rfl: 2 .  silodosin (RAPAFLO) 8 MG CAPS capsule, Take 1 capsule (8 mg total) by mouth daily with breakfast., Disp: 30 capsule, Rfl: 11 .  tadalafil (CIALIS) 20 MG tablet, 0.5-1 tab p.o. 1 hour prior to intercourse, Disp: 30 tablet, Rfl: 3 .  traZODone (DESYREL) 100 MG tablet, TAKE 1 TABLET AT BEDTIME, Disp: 90 tablet, Rfl: 1 .  Vitamin D, Ergocalciferol, (DRISDOL) 1.25 MG (50000 UNIT) CAPS capsule, TAKE 1 CAPSULE EVERY 7 DAYS, Disp: 12 capsule, Rfl: 1  Allergies  Allergen Reactions  . Ibuprofen Itching  . Levofloxacin   . Lisinopril Cough  . Metoprolol   . Nsaids Itching  . Tolmetin Itching  . Tramadol Hcl Itching  . Tylenol [Acetaminophen] Itching    I personally reviewed {Reviewed:14835} with the patient/caregiver today.   ROS  ***  Objective  There were no vitals filed for this visit.  There is no height or weight on file to calculate BMI.  Physical Exam ***  No results found for this or any previous visit (from the past 2160 hour(s)).  Diabetic Foot Exam: Diabetic Foot Exam - Simple   No data filed    ***  PHQ2/9: Depression screen [facility] 2/9 05/18/2020 03/10/2020 11/09/2019 10/08/2019 08/10/2019   Decreased Interest 0 1 0 0 0  Down, Depressed, Hopeless 0 0 0 0 0  PHQ - 2 Score 0 1 0 0 0  Altered sleeping - 0 1 0 0  Tired, decreased energy - 2 0 3 0  Change in appetite - 3 0 3 0  Feeling bad or failure about yourself  - 0 0 0 0  Trouble concentrating - 0 0 0 0  Moving slowly or fidgety/restless - 0 0 0 0  Suicidal thoughts - 0 0 0 0  PHQ-9 Score - _0 0  Difficult doing work/chores - Somewhat difficult Not difficult at all Not difficult at all Not difficult at all  Some recent data might be hidden    phq 9 is {gen pos [id number]} ***  Fall Risk: Fall Risk  05/18/2020 03/10/2020 11/09/2019 10/08/2019 08/10/2019  Falls in the past year? 0 0 0 0 0  Number falls in past yr: 0 0 - 0 0  Injury with Fall? 0 0 - 0 0  Risk for fall due to : History of fall(s) - - - -  Follow up Falls prevention discussed - - - Falls evaluation completed   ***   Functional Status Survey:   ***   Assessment & Plan  *** There are no diagnoses linked to this encounter.

## 2020-09-08 ENCOUNTER — Ambulatory Visit: Payer: Medicare Other | Admitting: Family Medicine

## 2020-09-08 NOTE — Progress Notes (Signed)
Name: David Skinner   MRN: [id number]    DOB: [birth year]   Date:09/11/2020       Progress Note  Subjective  Chief Complaint  Follow up  He came in with vietnamese interpreter: Marijean Bravo  Reminded patient to bring all his medications to every office visit   HPI   Change in Bowel movements: last visit he was having diarrhea, now he has [facility] 1 and has to go to the bathroom multiple times to empty his bowels, he has to strain, no blood mixed with stools, but occasionally has blood when he wipes, he is due for colonoscopy next month, but we will refer him now.   Major depression recurrent :going on for years butworsein2018, he is on Cymbalta since April 2018, he stateshe seems to be feeling well, only problem is difficulty sleeping since he rain out of trazodone  Hyperlipidemia :he is taking Atorvastatin 80 mg dailyNo myalgia or chest pain. Last LDL was down to 59   OSA/minimal/RLS: he sees Dr Manuella Ghazi for memory loss and had sleep study2018it showed minimal sleep apnea and RLS -seeing Dr. Manuella Ghazi.Last RBC was elevated. We will monitor   HTN: he is on lower dose of bp medication and doing well, no chest pain or palpitation. Denies dizziness.   Alcoholism : he used to drink heavy while in [place] used to drink liquor at least 3 times a week and used to get drunk, when he moved to [place] ( 1979). He was still drinking better but quit in2017.He states he started drinking alcohol again, and on his last visit was drinkin beer and hard liquor. He states he stopped drinking again early 2022  Urinary frequency: seen by Urologistand states medication is working well for him now. however he was also given Cialis for ED but is not having problems with ejaculation, explained he needs to go back. We will give him the number to go back   Partial Seizure: Last episode 02/2016 and went to [facility],taking medication daily now and isseeing Dr. Manuella Ghazi. No recent episodes, and he denies drinking recently,  he lost to follow up with neurologist, he states he still has medications at home, but not sure of the name of medications, has follow up in April     Diabetes Type 2 diet controlled with ED, hgbA1C hadgone up to 6.9% and we started Metformin back in 10/2015 but he has been off medications for months .  A1C has been stable, but up to 6.4 % it was 6 % last visit  he  has denies polyphagia or polydipsia, has urinary frequency due to LUTS  Continues statin therapy.   Insomnia: he ran out of Trazodone and is not sleeping well since he has been out of medication, feeling tired during the day  Patient Active Problem List   Diagnosis Date Noted  . Alcoholism ([place]) 03/10/2020  . OSA (obstructive sleep apnea) 12/09/2016  . RLS (restless legs syndrome) 12/09/2016  . Episode of moderate major depression ([place]) 12/09/2016  . History of alcoholism ([place]) 07/22/2016  . Cervical radiculitis 01/09/2016  . Lumbosacral radiculitis 01/09/2016  . Osteoarthritis of carpometacarpal (CMC) joint of thumb 01/09/2016  . Tinea cruris 03/21/2015  . 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 Depression with anxiety 11/28/2014  . Chronic constipation 11/28/2014  . Insomnia 11/28/2014  . Dyslipidemia 11/28/2014  . Fatty infiltration of liver 11/28/2014  . Essential (primary) hypertension 11/28/2014  . Gastric reflux 11/28/2014  . Memory loss  or impairment 11/28/2014  . Perennial allergic rhinitis with seasonal variation 11/28/2014  . Vitamin D deficiency 11/28/2014  . Tenosynovitis of thumb 11/28/2014  . Partial epilepsy with impairment of consciousness ([place]) 11/16/2014    Past Surgical History:  Procedure Laterality Date  . COLONOSCOPY WITH PROPOFOL N/A 03/25/2017   Procedure: COLONOSCOPY WITH PROPOFOL;  Surgeon: Lin Landsman, MD;  Location: [place];  Service: Endoscopy;  Laterality: N/A;  . ESOPHAGOGASTRODUODENOSCOPY N/A 03/25/2017   Procedure:  ESOPHAGOGASTRODUODENOSCOPY (EGD);  Surgeon: Lin Landsman, MD;  Location: [facility];  Service: Endoscopy;  Laterality: N/A;  Diabetic - oral meds  . LASIK Bilateral     Family History  Problem Relation Age of Onset  . Diabetes Mother   . Heart disease Mother     Social History   Tobacco Use  . Smoking status: Former Smoker    Packs/day: 1.00    Years: 15.00    Pack years: 15.00    Types: Cigarettes    Start date: 06/17/1984    Quit date: 11/28/1999    Years since quitting: 20.8  . Smokeless tobacco: Never Used  Substance Use Topics  . Alcohol use: Yes    Alcohol/week: 0.0 standard drinks    Comment: occassional beer     Current Outpatient Medications:  .  albuterol (VENTOLIN HFA) 108 (90 Base) MCG/ACT inhaler, TAKE 2 PUFFS BY MOUTH EVERY 6 HOURS AS NEEDED FOR WHEEZE OR SHORTNESS OF BREATH, Disp: 18 g, Rfl: 0 .  aspirin EC 81 MG tablet, Take 1 tablet (81 mg total) by mouth daily., Disp: 30 tablet, Rfl: 0 .  atorvastatin (LIPITOR) 80 MG tablet, TAKE 1 TABLET BY MOUTH EVERY DAY, Disp: 90 tablet, Rfl: 3 .  Blood Glucose Monitoring Suppl (ACCU-CHEK AVIVA PLUS) w/Device KIT, 1 kit by Does not apply route daily., Disp: 1 kit, Rfl: 0 .  dicyclomine (BENTYL) 20 MG tablet, Take 20 mg by mouth 4 (four) times daily., Disp: , Rfl:  .  donepezil (ARICEPT) 5 MG tablet, Take 5 mg by mouth at bedtime. , Disp: , Rfl:  .  doxycycline (ADOXA) 50 MG tablet, Take 1 tablet (50 mg total) by mouth daily. Take with food and drink, Disp: 30 tablet, Rfl: 6 .  DULoxetine (CYMBALTA) 60 MG capsule, Take 1 capsule (60 mg total) by mouth daily., Disp: 90 capsule, Rfl: 1 .  glucose blood (ACCU-CHEK AVIVA PLUS) test strip, 1 each by Other route 2 (two) times daily as needed for other. c, Disp: 100 each, Rfl: 3 .  ketoconazole (NIZORAL) 2 % shampoo, Shampoo scalp 3 times a week, let sit 10 minutes and rinse out, Disp: 120 mL, Rfl: 6 .  Lancets (ACCU-CHEK SOFT TOUCH) lancets, 1 each by Other route 2  (two) times daily as needed for other. DMII, Disp: 100 each, Rfl: 3 .  levETIRAcetam (KEPPRA) 250 MG tablet, Take by mouth., Disp: , Rfl:  .  loratadine (CLARITIN) 10 MG tablet, Take 1 tablet (10 mg total) by mouth 2 (two) times daily., Disp: 180 tablet, Rfl: 1 .  losartan (COZAAR) 25 MG tablet, Take 1 tablet (25 mg total) by mouth daily., Disp: 90 tablet, Rfl: 1 .  mometasone (ELOCON) 0.1 % lotion, Apply topically as directed. Apply 1-2 gtts to aa itchy scalp qd up to 4 days a week, Disp: 60 mL, Rfl: 0 .  montelukast (SINGULAIR) 10 MG tablet, TAKE 1 TABLET DAILY, Disp: 90 tablet, Rfl: 2 .  olopatadine (PATANOL) 0.1 % ophthalmic solution, Place  1 drop into both eyes 2 (two) times daily., Disp: 5 mL, Rfl: 2 .  silodosin (RAPAFLO) 8 MG CAPS capsule, Take 1 capsule (8 mg total) by mouth daily with breakfast., Disp: 30 capsule, Rfl: 11 .  sulfamethoxazole-trimethoprim (BACTRIM DS) 800-160 MG tablet, SMARTSIG:1 Tablet(s) By Mouth Every 12 Hours, Disp: , Rfl:  .  tadalafil (CIALIS) 20 MG tablet, 0.5-1 tab p.o. 1 hour prior to intercourse, Disp: 30 tablet, Rfl: 3 .  traZODone (DESYREL) 100 MG tablet, TAKE 1 TABLET AT BEDTIME, Disp: 90 tablet, Rfl: 1 .  Vitamin D, Ergocalciferol, (DRISDOL) 1.25 MG (50000 UNIT) CAPS capsule, TAKE 1 CAPSULE EVERY 7 DAYS, Disp: 12 capsule, Rfl: 1  Allergies  Allergen Reactions  . Ibuprofen Itching  . Levofloxacin   . Lisinopril Cough  . Metoprolol   . Nsaids Itching  . Tolmetin Itching  . Tramadol Hcl Itching  . Tylenol [Acetaminophen] Itching    I personally reviewed active problem list, medication list, allergies, family history, social history, health maintenance with the patient/caregiver today.   ROS  Constitutional: Negative for fever or weight change.  Respiratory: Negative for cough and shortness of breath.   Cardiovascular: Negative for chest pain or palpitations.  Gastrointestinal: Negative for abdominal pain, no bowel changes.  Musculoskeletal:  Negative for gait problem or joint swelling.  Skin: Negative for rash.  Neurological: Negative for dizziness or headache.  No other specific complaints in a complete review of systems (except as listed in HPI above).  Objective  Vitals:   09/11/20 1309  BP: 130/74  Pulse: 70  Resp: 16  Temp: 97.6 F (36.4 C)  TempSrc: Oral  SpO2: 98%  Weight: 158 lb (71.7 kg)  Height: _0  (1.651 m)    Body mass index is 26.29 kg/m.  Physical Exam  Constitutional: Patient appears well-developed and well-nourished. No distress.  HEENT: head atraumatic, normocephalic, pupils equal and reactive to light,  neck supple Cardiovascular: Normal rate, regular rhythm and normal heart sounds.  No murmur heard. No BLE edema. Pulmonary/Chest: Effort normal and breath sounds normal. No respiratory distress. Abdominal: Soft.  There is no tenderness. Psychiatric: Patient has a normal mood and affect. behavior is normal. Judgment and thought content normal.  Recent Results (from the past 2160 hour(s))  POCT HgB A1C     Status: Abnormal   Collection Time: 09/11/20  1:22 PM  Result Value Ref Range   Hemoglobin A1C 6.4 (A) 4.0 - 5.6 %   HbA1c POC (<> result, manual entry)     HbA1c, POC (prediabetic range)     HbA1c, POC (controlled diabetic range)       PHQ2/9: Depression screen [facility] 2/9 09/11/2020 05/18/2020 03/10/2020 11/09/2019 10/08/2019  Decreased Interest 0 0 1 0 0  Down, Depressed, Hopeless 0 0 0 0 0  PHQ - 2 Score 0 0 1 0 0  Altered sleeping 3 - 0 1 0  Tired, decreased energy 2 - 2 0 3  Change in appetite 1 - 3 0 3  Feeling bad or failure about yourself  0 - 0 0 0  Trouble concentrating 0 - 0 0 0  Moving slowly or fidgety/restless 0 - 0 0 0  Suicidal thoughts 0 - 0 0 0  PHQ-9 Score 6 - _1 Difficult doing work/chores - - Somewhat difficult Not difficult at all Not difficult at all  Some recent data might be hidden    phq 9 is negative   Fall Risk: Fall Risk  09/11/2020  05/18/2020  03/10/2020 11/09/2019 10/08/2019  Falls in the past year? 0 0 0 0 0  Number falls in past yr: 0 0 0 - 0  Injury with Fall? 0 0 0 - 0  Risk for fall due to : - History of fall(s) - - -  Follow up - Falls prevention discussed - - -      Functional Status Survey: Is the patient deaf or have difficulty hearing?: No Does the patient have difficulty seeing, even when wearing glasses/contacts?: No Does the patient have difficulty concentrating, remembering, or making decisions?: No Does the patient have difficulty walking or climbing stairs?: No Does the patient have difficulty dressing or bathing?: No Does the patient have difficulty doing errands alone such as visiting a doctor's office or shopping?: No   Assessment & Plan  1. 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 Need for 23-polyvalent pneumococcal polysaccharide vaccine  - Pneumococcal polysaccharide vaccine 23-valent greater than or equal to 2yo subcutaneous/IM  3. Essential (primary) hypertension  - losartan (COZAAR) 25 MG tablet; Take 1 tablet (25 mg total) by mouth daily.  Dispense: 90 tablet; Refill: 1  4. Major depression in remission (HCC)  - DULoxetine (CYMBALTA) 60 MG capsule; Take 1 capsule (60 mg total) by mouth daily.  Dispense: 90 capsule; Refill: 1  5. OSA (obstructive sleep apnea)   6. Primary insomnia  - traZODone (DESYREL) 100 MG tablet; Take 1 tablet (100 mg total) by mouth at bedtime.  Dispense: 90 tablet; Refill: 1  7. Vitamin D deficiency  - Vitamin D, Ergocalciferol, (DRISDOL) 1.25 MG (50000 UNIT) CAPS capsule; Take 1 capsule (50,000 Units total) by mouth every 7 (seven) days.  Dispense: 12 capsule; Refill: 1  8. Partial epilepsy with impairment of consciousness ([place])  Keep follow up with neurologist   9. History of alcoholism ([place])  Early remission   10. Perennial allergic rhinitis with seasonal variation  - fluticasone (FLONASE) 50  MCG/ACT nasal spray; Place 2 sprays into both nostrils daily.  Dispense: 48 g; Refill: 1 - olopatadine (PATANOL) 0.1 % ophthalmic solution; Place 1 drop into both eyes 2 (two) times daily.  Dispense: 5 mL; Refill: 2  11. Dyslipidemia   12. Change in bowel movement  - Ambulatory referral to Gastroenterology  13. Itchy eyes  - olopatadine (PATANOL) 0.1 % ophthalmic solution; Place 1 drop into both eyes 2 (two) times daily.  Dispense: 5 mL; Refill: 2

## 2020-09-11 ENCOUNTER — Other Ambulatory Visit: Payer: Self-pay

## 2020-09-11 ENCOUNTER — Encounter: Payer: Self-pay | Admitting: *Deleted

## 2020-09-11 ENCOUNTER — Encounter: Payer: Self-pay | Admitting: Family Medicine

## 2020-09-11 ENCOUNTER — Ambulatory Visit (INDEPENDENT_AMBULATORY_CARE_PROVIDER_SITE_OTHER): Payer: Medicare Other | Admitting: Family Medicine

## 2020-09-11 VITALS — BP 130/74 | HR 70 | Temp 97.6°F | Resp 16 | Ht 65.0 in | Wt 158.0 lb

## 2020-09-11 DIAGNOSIS — J3089 Other allergic rhinitis: Secondary | ICD-10-CM

## 2020-09-11 DIAGNOSIS — E1140 Type 2 diabetes mellitus with diabetic neuropathy, unspecified: Secondary | ICD-10-CM

## 2020-09-11 DIAGNOSIS — N521 Erectile dysfunction due to diseases classified elsewhere: Secondary | ICD-10-CM | POA: Diagnosis not present

## 2020-09-11 DIAGNOSIS — G4733 Obstructive sleep apnea (adult) (pediatric): Secondary | ICD-10-CM

## 2020-09-11 DIAGNOSIS — R6889 Other general symptoms and signs: Secondary | ICD-10-CM

## 2020-09-11 DIAGNOSIS — Z23 Encounter for immunization: Secondary | ICD-10-CM | POA: Diagnosis not present

## 2020-09-11 DIAGNOSIS — F5101 Primary insomnia: Secondary | ICD-10-CM

## 2020-09-11 DIAGNOSIS — E559 Vitamin D deficiency, unspecified: Secondary | ICD-10-CM

## 2020-09-11 DIAGNOSIS — F325 Major depressive disorder, single episode, in full remission: Secondary | ICD-10-CM | POA: Diagnosis not present

## 2020-09-11 DIAGNOSIS — I10 Essential (primary) hypertension: Secondary | ICD-10-CM | POA: Diagnosis not present

## 2020-09-11 DIAGNOSIS — G40209 Localization-related (focal) (partial) symptomatic epilepsy and epileptic syndromes with complex partial seizures, not intractable, without status epilepticus: Secondary | ICD-10-CM

## 2020-09-11 DIAGNOSIS — J302 Other seasonal allergic rhinitis: Secondary | ICD-10-CM

## 2020-09-11 DIAGNOSIS — R198 Other specified symptoms and signs involving the digestive system and abdomen: Secondary | ICD-10-CM

## 2020-09-11 DIAGNOSIS — E785 Hyperlipidemia, unspecified: Secondary | ICD-10-CM

## 2020-09-11 DIAGNOSIS — F1021 Alcohol dependence, in remission: Secondary | ICD-10-CM

## 2020-09-11 LAB — POCT GLYCOSYLATED HEMOGLOBIN (HGB A1C): Hemoglobin A1C: 6.4 % — AB (ref 4.0–5.6)

## 2020-09-11 MED ORDER — LOSARTAN POTASSIUM 25 MG PO TABS: 25.0000 mg | ORAL_TABLET | Freq: Every day | ORAL | 1 refills | Status: DC

## 2020-09-11 MED ORDER — DULOXETINE HCL 60 MG PO CPEP: 60.0000 mg | ORAL_CAPSULE | Freq: Every day | ORAL | 1 refills | Status: DC

## 2020-09-11 MED ORDER — OLOPATADINE HCL 0.1 % OP SOLN: 1.0000 [drp] | Freq: Two times a day (BID) | OPHTHALMIC | 2 refills | Status: DC

## 2020-09-11 MED ORDER — FLUTICASONE PROPIONATE 50 MCG/ACT NA SUSP: 2.0000 | Freq: Every day | NASAL | 1 refills | Status: DC

## 2020-09-11 MED ORDER — VITAMIN D (ERGOCALCIFEROL) 1.25 MG (50000 UNIT) PO CAPS: 50000.0000 [IU] | ORAL_CAPSULE | ORAL | 1 refills | Status: DC

## 2020-09-11 MED ORDER — TRAZODONE HCL 100 MG PO TABS: 100.0000 mg | ORAL_TABLET | Freq: Every day | ORAL | 1 refills | Status: DC

## 2020-09-11 NOTE — Patient Instructions (Signed)
(  [phone]  Dr. Bernardo Heater

## 2020-09-13 DIAGNOSIS — G40219 Localization-related (focal) (partial) symptomatic epilepsy and epileptic syndromes with complex partial seizures, intractable, without status epilepticus: Secondary | ICD-10-CM | POA: Diagnosis not present

## 2020-09-13 DIAGNOSIS — R413 Other amnesia: Secondary | ICD-10-CM | POA: Diagnosis not present

## 2020-09-13 DIAGNOSIS — E538 Deficiency of other specified B group vitamins: Secondary | ICD-10-CM | POA: Diagnosis not present

## 2020-10-03 ENCOUNTER — Other Ambulatory Visit: Payer: Self-pay | Admitting: Family Medicine

## 2020-10-03 DIAGNOSIS — E559 Vitamin D deficiency, unspecified: Secondary | ICD-10-CM

## 2020-10-03 NOTE — Telephone Encounter (Signed)
Requested medications are due for refill today.  yes  Requested medications are on the active medications list.  yes  Last refill. 09/11/2020  Future visit scheduled.   yes  Notes to clinic.  Medication not delegated.

## 2020-10-04 ENCOUNTER — Other Ambulatory Visit: Payer: Self-pay | Admitting: Family Medicine

## 2020-10-04 DIAGNOSIS — F5101 Primary insomnia: Secondary | ICD-10-CM

## 2020-10-10 ENCOUNTER — Ambulatory Visit: Payer: Medicare Other | Admitting: Dermatology

## 2020-10-23 ENCOUNTER — Ambulatory Visit: Payer: Self-pay | Admitting: Urology

## 2020-11-02 ENCOUNTER — Ambulatory Visit (INDEPENDENT_AMBULATORY_CARE_PROVIDER_SITE_OTHER): Payer: Medicare Other | Admitting: Urology

## 2020-11-02 ENCOUNTER — Other Ambulatory Visit: Payer: Self-pay

## 2020-11-02 ENCOUNTER — Encounter: Payer: Self-pay | Admitting: Urology

## 2020-11-02 VITALS — BP 148/94 | HR 82 | Ht 65.0 in | Wt 160.0 lb

## 2020-11-02 DIAGNOSIS — N401 Enlarged prostate with lower urinary tract symptoms: Secondary | ICD-10-CM

## 2020-11-02 DIAGNOSIS — N5201 Erectile dysfunction due to arterial insufficiency: Secondary | ICD-10-CM

## 2020-11-02 DIAGNOSIS — R350 Frequency of micturition: Secondary | ICD-10-CM

## 2020-11-02 DIAGNOSIS — N138 Other obstructive and reflux uropathy: Secondary | ICD-10-CM | POA: Diagnosis not present

## 2020-11-02 NOTE — Progress Notes (Signed)
11/02/2020 1:14 PM   Arash Rockie Neighbours Svehla 06-Oct-1950 [id number]  Referring provider: [name], MD [address] [place],  [place] [address]  Chief Complaint  Patient presents with  . Erectile Dysfunction    Urologic history: 1. BPH with LUTS  Silodosin 8 mg daily  2.  Erectile dysfunction  Tadalafil 20 mg prn   HPI: 70 y.o. male presents for annual follow-up.  A [place] interpreter was present via video link.   Remains on silodosin and states since last visit he has had increased urinary frequency; nocturia improved to 1-2  He has tried several previous medications including tamsulosin which did not improve his symptoms  Previously on Myrbetriq with marked improvement in his symptoms however the efficacy waned  Cystoscopy in 2019 showed no significant lateral lobe enlargement or bladder mucosal abnormalities  Stable ED on tadalafil   PMH: Past Medical History:  Diagnosis Date  . Allergy   . Anxiety   . Atopy   . Cataract of right eye    [facility]  . Chronic constipation   . Elevated LFTs   . Fatty liver   . GERD (gastroesophageal reflux disease)   . Hyperlipidemia   . Hypertension   . Hypoglycemia   . Insomnia   . Memory change   . Overweight   . Paresthesia of hand   . Seizure ([place])    12/12, 09/27/14, 02/28/16  . Tenosynovitis of finger   . Tinea cruris   . Vitamin D deficiency     Surgical History: Past Surgical History:  Procedure Laterality Date  . COLONOSCOPY WITH PROPOFOL N/A 03/25/2017   Procedure: COLONOSCOPY WITH PROPOFOL;  Surgeon: Lin Landsman, MD;  Location: [facility];  Service: Endoscopy;  Laterality: N/A;  . ESOPHAGOGASTRODUODENOSCOPY N/A 03/25/2017   Procedure: ESOPHAGOGASTRODUODENOSCOPY (EGD);  Surgeon: Lin Landsman, MD;  Location: [facility];  Service: Endoscopy;  Laterality: N/A;  Diabetic - oral meds  . LASIK Bilateral     Home Medications:  Allergies as of  11/02/2020      Reactions   Ibuprofen Itching   Levofloxacin    Lisinopril Cough   Metoprolol    Nsaids Itching   Tolmetin Itching   Tramadol Hcl Itching   Tylenol [acetaminophen] Itching      Medication List       Accurate as of Nov 02, 2020  1:14 PM. If you have any questions, ask your nurse or doctor.        Accu-Chek Aviva Plus w/Device Kit 1 kit by Does not apply route daily.   accu-chek soft touch lancets 1 each by Other route 2 (two) times daily as needed for other. DMII   albuterol 108 (90 Base) MCG/ACT inhaler Commonly known as: VENTOLIN HFA TAKE 2 PUFFS BY MOUTH EVERY 6 HOURS AS NEEDED FOR WHEEZE OR SHORTNESS OF BREATH   aspirin EC 81 MG tablet Take 1 tablet (81 mg total) by mouth daily.   atorvastatin 80 MG tablet Commonly known as: LIPITOR TAKE 1 TABLET BY MOUTH EVERY DAY   dicyclomine 20 MG tablet Commonly known as: BENTYL Take 20 mg by mouth 4 (four) times daily.   donepezil 5 MG tablet Commonly known as: ARICEPT Take 5 mg by mouth at bedtime.   doxycycline 50 MG tablet Commonly known as: ADOXA Take 1 tablet (50 mg total) by mouth daily. Take with food and drink   DULoxetine 60 MG capsule Commonly known as: CYMBALTA Take 1 capsule (60 mg  total) by mouth daily.   fluticasone 50 MCG/ACT nasal spray Commonly known as: FLONASE Place 2 sprays into both nostrils daily.   glucose blood test strip Commonly known as: Accu-Chek Aviva Plus 1 each by Other route 2 (two) times daily as needed for other. c   ketoconazole 2 % shampoo Commonly known as: NIZORAL Shampoo scalp 3 times a week, let sit 10 minutes and rinse out   loratadine 10 MG tablet Commonly known as: CLARITIN Take 1 tablet (10 mg total) by mouth 2 (two) times daily.   losartan 25 MG tablet Commonly known as: COZAAR Take 1 tablet (25 mg total) by mouth daily.   mometasone 0.1 % lotion Commonly known as: ELOCON Apply topically as directed. Apply 1-2 gtts to aa itchy scalp qd up to  4 days a week   montelukast 10 MG tablet Commonly known as: SINGULAIR TAKE 1 TABLET DAILY   olopatadine 0.1 % ophthalmic solution Commonly known as: Patanol Place 1 drop into both eyes 2 (two) times daily.   silodosin 8 MG Caps capsule Commonly known as: RAPAFLO Take 1 capsule (8 mg total) by mouth daily with breakfast.   tadalafil 20 MG tablet Commonly known as: Cialis 0.5-1 tab p.o. 1 hour prior to intercourse   traZODone 100 MG tablet Commonly known as: DESYREL Take 1 tablet (100 mg total) by mouth at bedtime.   Vitamin D (Ergocalciferol) 1.25 MG (50000 UNIT) Caps capsule Commonly known as: DRISDOL TAKE 1 CAPSULE EVERY 7 DAYS       Allergies:  Allergies  Allergen Reactions  . Ibuprofen Itching  . Levofloxacin   . Lisinopril Cough  . Metoprolol   . Nsaids Itching  . Tolmetin Itching  . Tramadol Hcl Itching  . Tylenol [Acetaminophen] Itching    Family History: Family History  Problem Relation Age of Onset  . Diabetes Mother   . Heart disease Mother     Social History:  reports that he quit smoking about 20 years ago. His smoking use included cigarettes. He started smoking about 36 years ago. He has a 15.00 pack-year smoking history. He has never used smokeless tobacco. He reports current alcohol use. He reports that he does not use drugs.   Physical Exam: There were no vitals taken for this visit.  Constitutional:  Alert and oriented, No acute distress. HEENT: [place] AT, moist mucus membranes.  Trachea midline, no masses. Cardiovascular: No clubbing, cyanosis, or edema. Respiratory: Normal respiratory effort, no increased work of breathing. GI: Abdomen is soft, nontender, nondistended, no abdominal masses   Assessment & Plan:    1.  BPH with LUTS  Worsening urinary frequency  Will add Gemtesa 75 mg-presently out of samples and the patient will be contacted when they become available  He will call back regarding efficacy of this medication after taking  4 weeks  2.  Erectile dysfunction  Stable on tadalafil    Abbie Sons, MD  [facility] [address], [place], [place] [address] [phone]

## 2020-11-03 LAB — URINALYSIS, COMPLETE
Bilirubin, UA: NEGATIVE
Glucose, UA: NEGATIVE
Ketones, UA: NEGATIVE
Leukocytes,UA: NEGATIVE
Nitrite, UA: NEGATIVE
Protein,UA: NEGATIVE
RBC, UA: NEGATIVE
Specific Gravity, UA: 1.015 (ref 1.005–1.030)
Urobilinogen, Ur: 0.2 mg/dL (ref 0.2–1.0)
pH, UA: 5.5 (ref 5.0–7.5)

## 2020-11-22 ENCOUNTER — Encounter: Payer: Self-pay | Admitting: Family Medicine

## 2020-11-22 ENCOUNTER — Encounter: Payer: Self-pay | Admitting: Gastroenterology

## 2021-01-02 ENCOUNTER — Other Ambulatory Visit: Payer: Self-pay | Admitting: Dermatology

## 2021-01-02 DIAGNOSIS — L739 Follicular disorder, unspecified: Secondary | ICD-10-CM

## 2021-01-03 ENCOUNTER — Encounter: Payer: Self-pay | Admitting: Gastroenterology

## 2021-01-03 ENCOUNTER — Ambulatory Visit (INDEPENDENT_AMBULATORY_CARE_PROVIDER_SITE_OTHER): Payer: Medicare Other | Admitting: Gastroenterology

## 2021-01-03 ENCOUNTER — Other Ambulatory Visit: Payer: Self-pay

## 2021-01-03 VITALS — BP 115/76 | HR 71 | Temp 97.6°F | Ht 65.0 in | Wt 150.4 lb

## 2021-01-03 DIAGNOSIS — R194 Change in bowel habit: Secondary | ICD-10-CM | POA: Diagnosis not present

## 2021-01-03 DIAGNOSIS — Z23 Encounter for immunization: Secondary | ICD-10-CM | POA: Diagnosis not present

## 2021-01-03 MED ORDER — NA SULFATE-K SULFATE-MG SULF 17.5-3.13-1.6 GM/177ML PO SOLN: 354.0000 mL | Freq: Once | ORAL | 0 refills | Status: AC

## 2021-01-03 NOTE — Progress Notes (Signed)
Wyline Mood MD, MRCP(U.K) [address]  [address]  [place], [place] [address]  Main: [phone]  Fax: [phone]   Gastroenterology Consultation  Referring Provider:     Alba Cory, MD Primary Care Physician:  Alba Cory, MD Primary Gastroenterologist:  Dr. Wyline Mood  Reason for Consultation:     Change in bowel habit        HPI:   David Skinner Word is a 70 y.o. y/o male referred for consultation & management  by Dr. Alba Cory, MD.    March 2022: Hemoglobin 16.2 g, B12 normal, CMP normal, TSH normal 04/05/2017 colonoscopy: Was normal, prior history of tubular adenoma in 2008.  EGD in October 2018 erythema seen in the gastric antrum otherwise normal showed chronic inactive gastritis. The entire visit was conducted via an interpreter on video, the patient speaks only [place]  He says that for the past few months he has noticed a change in bowel habits he has to have multiple bowel movements to completely evacuate his bowels.  Stool is hard to begin with associated with perianal itching and some bleeding and eventually becomes very soft and watery.  He states he has lost up to 10 pounds of weight over the past few months.  It states that the shape of stool has changed which is more fragmented.  Past Medical History:  Diagnosis Date   Allergy    Anxiety    Atopy    Cataract of right eye    [facility]   Chronic constipation    Elevated LFTs    Fatty liver    GERD (gastroesophageal reflux disease)    Hyperlipidemia    Hypertension    Hypoglycemia    Insomnia    Memory change    Overweight    Paresthesia of hand    Seizure (HCC)    12/12, 09/27/14, 02/28/16   Tenosynovitis of finger    Tinea cruris    Vitamin D deficiency     Past Surgical History:  Procedure Laterality Date   COLONOSCOPY WITH PROPOFOL N/A 03/25/2017   Procedure: COLONOSCOPY WITH PROPOFOL;  Surgeon: Toney Reil, MD;  Location: [facility];  Service:  Endoscopy;  Laterality: N/A;   ESOPHAGOGASTRODUODENOSCOPY N/A 03/25/2017   Procedure: ESOPHAGOGASTRODUODENOSCOPY (EGD);  Surgeon: Toney Reil, MD;  Location: [facility];  Service: Endoscopy;  Laterality: N/A;  Diabetic - oral meds   LASIK Bilateral     Prior to Admission medications   Medication Sig Start Date End Date Taking? Authorizing Provider  albuterol (VENTOLIN HFA) 108 (90 Base) MCG/ACT inhaler TAKE 2 PUFFS BY MOUTH EVERY 6 HOURS AS NEEDED FOR WHEEZE OR SHORTNESS OF BREATH 10/26/19   Alba Cory, MD  aspirin EC 81 MG tablet Take 1 tablet (81 mg total) by mouth daily. 06/15/15   Alba Cory, MD  atorvastatin (LIPITOR) 80 MG tablet TAKE 1 TABLET BY MOUTH EVERY DAY 04/04/20   Alba Cory, MD  Blood Glucose Monitoring Suppl (ACCU-CHEK AVIVA PLUS) w/Device KIT 1 kit by Does not apply route daily. 09/22/18   Alba Cory, MD  dicyclomine (BENTYL) 20 MG tablet Take 20 mg by mouth 4 (four) times daily. 08/07/20   [provider]  donepezil (ARICEPT) 5 MG tablet Take 5 mg by mouth at bedtime.  12/24/18   [provider]  doxycycline (ADOXA) 50 MG tablet Take 1 tablet (50 mg total) by mouth daily. Take with food and drink 01/25/20   Deirdre Evener, MD  DULoxetine (CYMBALTA) 60 MG capsule Take 1 capsule (60 mg total) by mouth daily. 09/11/20   [name], MD  fluticasone (FLONASE) 50 MCG/ACT nasal spray Place 2 sprays into both nostrils daily. 09/11/20   [name], MD  glucose blood (ACCU-CHEK AVIVA PLUS) test strip 1 each by Other route 2 (two) times daily as needed for other. c 08/06/18   [name], MD  ketoconazole (NIZORAL) 2 % shampoo Shampoo scalp 3 times a week, let sit 10 minutes and rinse out 01/25/20   Ralene Bathe, MD  Lancets (ACCU-CHEK SOFT [facility]) lancets 1 each by Other route 2 (two) times daily as needed for other. DMII 08/06/18   [name], MD  loratadine (CLARITIN) 10 MG tablet Take 1 tablet (10 mg total) by mouth 2  (two) times daily. 02/09/19   [name], MD  losartan (COZAAR) 25 MG tablet Take 1 tablet (25 mg total) by mouth daily. 09/11/20   [name], MD  mometasone (ELOCON) 0.1 % lotion Apply topically as directed. Apply 1-2 gtts to aa itchy scalp qd up to 4 days a week 01/25/20   Ralene Bathe, MD  montelukast (SINGULAIR) 10 MG tablet TAKE 1 TABLET DAILY 02/27/20   [name], MD  olopatadine (PATANOL) 0.1 % ophthalmic solution Place 1 drop into both eyes 2 (two) times daily. 09/11/20   [name], MD  silodosin (RAPAFLO) 8 MG CAPS capsule Take 1 capsule (8 mg total) by mouth daily with breakfast. 05/19/20   Stoioff, Ronda Fairly, MD  tadalafil (CIALIS) 20 MG tablet 0.5-1 tab p.o. 1 hour prior to intercourse 05/19/20   Stoioff, Ronda Fairly, MD  traZODone (DESYREL) 100 MG tablet Take 1 tablet (100 mg total) by mouth at bedtime. 09/11/20   [name], MD  Vitamin D, Ergocalciferol, (DRISDOL) 1.25 MG (50000 UNIT) CAPS capsule TAKE 1 CAPSULE EVERY 7 DAYS 10/04/20   [name], MD    Family History  Problem Relation Age of Onset   Diabetes Mother    Heart disease Mother      Social History   Tobacco Use   Smoking status: Former    Packs/day: 1.00    Years: 15.00    Pack years: 15.00    Types: Cigarettes    Start date: 06/17/1984    Quit date: 11/28/1999    Years since quitting: 21.1   Smokeless tobacco: Never  Vaping Use   Vaping Use: Never used  Substance Use Topics   Alcohol use: Yes    Alcohol/week: 0.0 standard drinks    Comment: occassional beer   Drug use: No    Allergies as of 01/03/2021 - Review Complete 01/03/2021  Allergen Reaction Noted   Ibuprofen Itching 11/28/2014   Levofloxacin  11/28/2014   Lisinopril Cough 11/28/2014   Metoprolol  11/28/2014   Nsaids Itching 03/08/2015   Tolmetin Itching 03/08/2015   Tramadol hcl Itching 11/28/2014   Tylenol [acetaminophen] Itching 05/18/2020    Review of Systems:    All systems reviewed and negative except  where noted in HPI.   Physical Exam:  BP 115/76   Pulse 71   Temp 97.6 F (36.4 C) (Oral)   Ht $R'5\' 5"'hQ$  (1.651 m)   Wt 150 lb 6.4 oz (68.2 kg)   BMI 25.03 kg/m  No LMP for male patient. Psych:  Alert and cooperative. Normal mood and affect. General:   Alert,  Well-developed, well-nourished, pleasant and cooperative in NAD Head:  Normocephalic and atraumatic. Eyes:  Sclera clear, no icterus.  Conjunctiva pink. Ears:  Normal auditory acuity.. Lungs:  Respirations even and unlabored.  Clear throughout to auscultation.   No wheezes, crackles, or rhonchi. No acute distress. Heart:  Regular rate and rhythm; no murmurs, clicks, rubs, or gallops. Abdomen:  Normal bowel sounds.  No bruits.  Soft, non-tender and non-distended without masses, Neurologic:  Alert and oriented x3;  grossly normal neurologically. Psych:  Alert and cooperative. Normal mood and affect.  Imaging Studies: No results found.  Assessment and Plan:   David Skinner is a 70 y.o. y/o male has been referred for referred to see me today for change in bowel habits Very likely he has underlying constipation with bleeding from hemorrhoids but we would like to rule out other causes  Plan 1.  Colonoscopy in the next 2 to 3 weeks 2.  Commence on MiraLAX half a cap to 1 capful daily to empirically treat constipation.  I will do a perianal and digital examination in detail on the day of this procedure  I have discussed alternative options, risks & benefits,  which include, but are not limited to, bleeding, infection, perforation,respiratory complication & drug reaction.  The patient agrees with this plan & written consent will be obtained.     Follow up in as needed  Dr [name] MD,MRCP(U.K)

## 2021-01-15 ENCOUNTER — Encounter: Payer: Self-pay | Admitting: Gastroenterology

## 2021-01-16 ENCOUNTER — Encounter
Admission: RE | Disposition: A | Discharge status: Home / Self Care | Payer: Self-pay | Source: Home / Self Care | Attending: Gastroenterology

## 2021-01-16 ENCOUNTER — Ambulatory Visit: Payer: Medicare Other | Admitting: Anesthesiology

## 2021-01-16 ENCOUNTER — Other Ambulatory Visit: Payer: Self-pay

## 2021-01-16 ENCOUNTER — Ambulatory Visit
Admission: RE | Admit: 2021-01-16 | Discharge: 2021-01-16 | Disposition: A | Discharge status: Home / Self Care | Payer: Medicare Other | Attending: Gastroenterology | Admitting: Gastroenterology

## 2021-01-16 ENCOUNTER — Encounter: Payer: Self-pay | Admitting: Gastroenterology

## 2021-01-16 DIAGNOSIS — D122 Benign neoplasm of ascending colon: Secondary | ICD-10-CM | POA: Insufficient documentation

## 2021-01-16 DIAGNOSIS — Z8249 Family history of ischemic heart disease and other diseases of the circulatory system: Secondary | ICD-10-CM | POA: Insufficient documentation

## 2021-01-16 DIAGNOSIS — K5730 Diverticulosis of large intestine without perforation or abscess without bleeding: Secondary | ICD-10-CM | POA: Diagnosis not present

## 2021-01-16 DIAGNOSIS — Z87891 Personal history of nicotine dependence: Secondary | ICD-10-CM | POA: Diagnosis not present

## 2021-01-16 DIAGNOSIS — Z833 Family history of diabetes mellitus: Secondary | ICD-10-CM | POA: Diagnosis not present

## 2021-01-16 DIAGNOSIS — K635 Polyp of colon: Secondary | ICD-10-CM | POA: Diagnosis not present

## 2021-01-16 DIAGNOSIS — Z7982 Long term (current) use of aspirin: Secondary | ICD-10-CM | POA: Diagnosis not present

## 2021-01-16 DIAGNOSIS — E119 Type 2 diabetes mellitus without complications: Secondary | ICD-10-CM | POA: Insufficient documentation

## 2021-01-16 DIAGNOSIS — Z791 Long term (current) use of non-steroidal anti-inflammatories (NSAID): Secondary | ICD-10-CM | POA: Diagnosis not present

## 2021-01-16 DIAGNOSIS — Z7951 Long term (current) use of inhaled steroids: Secondary | ICD-10-CM | POA: Diagnosis not present

## 2021-01-16 DIAGNOSIS — K5900 Constipation, unspecified: Secondary | ICD-10-CM | POA: Diagnosis not present

## 2021-01-16 DIAGNOSIS — K621 Rectal polyp: Secondary | ICD-10-CM | POA: Insufficient documentation

## 2021-01-16 DIAGNOSIS — Z888 Allergy status to other drugs, medicaments and biological substances status: Secondary | ICD-10-CM | POA: Insufficient documentation

## 2021-01-16 DIAGNOSIS — Z886 Allergy status to analgesic agent status: Secondary | ICD-10-CM | POA: Insufficient documentation

## 2021-01-16 DIAGNOSIS — R194 Change in bowel habit: Secondary | ICD-10-CM | POA: Diagnosis not present

## 2021-01-16 DIAGNOSIS — D120 Benign neoplasm of cecum: Secondary | ICD-10-CM | POA: Insufficient documentation

## 2021-01-16 DIAGNOSIS — K219 Gastro-esophageal reflux disease without esophagitis: Secondary | ICD-10-CM | POA: Diagnosis not present

## 2021-01-16 DIAGNOSIS — D126 Benign neoplasm of colon, unspecified: Secondary | ICD-10-CM | POA: Diagnosis not present

## 2021-01-16 HISTORY — PX: COLONOSCOPY WITH PROPOFOL: SHX5780

## 2021-01-16 SURGERY — COLONOSCOPY WITH PROPOFOL
Anesthesia: General

## 2021-01-16 MED ORDER — PROPOFOL 10 MG/ML IV BOLUS
INTRAVENOUS | Status: DC | PRN
  Administered 2021-01-16: 60 mg via INTRAVENOUS

## 2021-01-16 MED ORDER — SODIUM CHLORIDE 0.9 % IV SOLN: INTRAVENOUS | Status: DC

## 2021-01-16 MED ORDER — PROPOFOL 500 MG/50ML IV EMUL
INTRAVENOUS | Status: DC | PRN
  Administered 2021-01-16: 200 ug/kg/min via INTRAVENOUS

## 2021-01-16 NOTE — Op Note (Signed)
[facility] Patient Name: David Skinner Procedure Date: 01/16/2021 8:05 AM MRN: [id number] Account #: [id number] Date of Birth: [birth year] Admit Type: Outpatient Age: 70 Room: [facility] ENDO ROOM 2 Gender: Male Note Status: Finalized Procedure:             Colonoscopy Indications:           Constipation Providers:             [name] MD, MD Referring MD:          Bethena Roys. Sowles, MD (Referring MD) Medicines:             Monitored Anesthesia Care Complications:         No immediate complications. Procedure:             Pre-Anesthesia Assessment:                        - Prior to the procedure, a History and Physical was                         performed, and patient medications, allergies and                         sensitivities were reviewed. The patient's tolerance                         of previous anesthesia was reviewed.                        - The risks and benefits of the procedure and the                         sedation options and risks were discussed with the                         patient. All questions were answered and informed                         consent was obtained.                        - ASA Grade Assessment: II - A patient with mild                         systemic disease.                        After obtaining informed consent, the colonoscope was                         passed under direct vision. Throughout the procedure,                         the patient's blood pressure, pulse, and oxygen                         saturations were monitored continuously. The                         Colonoscope was introduced through the anus and  advanced to the the cecum, identified by the                         appendiceal orifice. The colonoscopy was performed                         with ease. The patient tolerated the procedure well.                         The quality of the bowel preparation was  excellent. Findings:      The perianal and digital rectal examinations were normal.      Two sessile polyps were found in the rectum. The polyps were 4 to 5 mm       in size. These polyps were removed with a cold snare. Resection and       retrieval were complete.      Two sessile polyps were found in the ascending colon and cecum. The       polyps were 3 to 4 mm in size. These polyps were removed with a cold       biopsy forceps. Resection and retrieval were complete.      Multiple small and large-mouthed diverticula were found in the entire       colon.      The exam was otherwise without abnormality on direct and retroflexion       views. Impression:            - Two 4 to 5 mm polyps in the rectum, removed with a                         cold snare. Resected and retrieved.                        - Two 3 to 4 mm polyps in the ascending colon and in                         the cecum, removed with a cold biopsy forceps.                         Resected and retrieved.                        - Diverticulosis in the entire examined colon.                        - The examination was otherwise normal on direct and                         retroflexion views. Recommendation:        - Discharge patient to home (with escort).                        - Resume previous diet.                        - Continue present medications.                        - Await pathology results.                        -  Repeat colonoscopy for surveillance based on                         pathology results. Procedure Code(s):     --- Professional ---                        [id number], Colonoscopy, flexible; with removal of                         tumor(s), polyp(s), or other lesion(s) by snare                         technique                        45380, 73, Colonoscopy, flexible; with biopsy, single                         or multiple Diagnosis Code(s):     --- Professional ---                        K62.1, Rectal  polyp                        K63.5, Polyp of colon                        K59.00, Constipation, unspecified                        K57.30, Diverticulosis of large intestine without                         perforation or abscess without bleeding CPT copyright 2019 American Medical Association. All rights reserved. The codes documented in this report are preliminary and upon coder review may  be revised to meet current compliance requirements. [name], MD [name] MD, MD 01/16/2021 9:03:08 AM This report has been signed electronically. Number of Addenda: 0 Note Initiated On: 01/16/2021 8:05 AM Scope Withdrawal Time: 0 hours 18 minutes 7 seconds  Total Procedure Duration: 0 hours 19 minutes 41 seconds  Estimated Blood Loss:  Estimated blood loss: none.      [facility]

## 2021-01-16 NOTE — Anesthesia Postprocedure Evaluation (Signed)
Anesthesia Post Note  Patient: David Skinner  Procedure(s) Performed: COLONOSCOPY WITH PROPOFOL  Patient location during evaluation: Endoscopy Anesthesia Type: General Level of consciousness: awake and alert Pain management: pain level controlled Vital Signs Assessment: post-procedure vital signs reviewed and stable Respiratory status: spontaneous breathing, nonlabored ventilation, respiratory function stable and patient connected to nasal cannula oxygen Cardiovascular status: blood pressure returned to baseline and stable Postop Assessment: no apparent nausea or vomiting Anesthetic complications: no   No notable events documented.   Last Vitals:  Vitals:   01/16/21 0900 01/16/21 0910  BP: 101/75 (!) 143/82  Pulse: 73 64  Resp: 19 16  Temp: (!) 36.2 C   SpO2: 95% 97%    Last Pain:  Vitals:   01/16/21 0900  TempSrc: Temporal  PainSc:                  Martha Clan

## 2021-01-16 NOTE — Anesthesia Preprocedure Evaluation (Signed)
Anesthesia Evaluation  Patient identified by MRN, date of birth, ID band Patient awake    Reviewed: Allergy & Precautions, H&P , NPO status , Patient's Chart, lab work & pertinent test results, reviewed documented beta blocker date and time   History of Anesthesia Complications Negative for: history of anesthetic complications  Airway Mallampati: II  TM Distance: >3 FB Neck ROM: full    Dental  (+) Poor Dentition, Dental Advidsory Given, Chipped   Pulmonary neg shortness of breath, sleep apnea , neg recent URI, former smoker,    Pulmonary exam normal breath sounds clear to auscultation       Cardiovascular Exercise Tolerance: Good hypertension, (-) angina(-) Past MI Normal cardiovascular exam(-) dysrhythmias  Rhythm:regular Rate:Normal     Neuro/Psych Seizures -, Well Controlled,  PSYCHIATRIC DISORDERS Anxiety Depression  Neuromuscular disease    GI/Hepatic Neg liver ROS, GERD  ,  Endo/Other  diabetes  Renal/GU negative Renal ROS  negative genitourinary   Musculoskeletal   Abdominal   Peds  Hematology negative hematology ROS (+)   Anesthesia Other Findings Past Medical History: No date: Allergy No date: Anxiety No date: Atopy No date: Cataract of right eye     Comment:  [place] No date: Chronic constipation No date: Elevated LFTs No date: Fatty liver No date: GERD (gastroesophageal reflux disease) No date: Hyperlipidemia No date: Hypertension No date: Hypoglycemia No date: Insomnia No date: Memory change No date: Overweight No date: Paresthesia of hand No date: Seizure [facility])     Comment:  12/12, 09/27/14, 02/28/16 No date: Tenosynovitis of finger No date: Tinea cruris No date: Vitamin D deficiency   Reproductive/Obstetrics negative OB ROS                             Anesthesia Physical Anesthesia Plan  ASA: 3  Anesthesia Plan: General   Post-op Pain  Management:    Induction: Intravenous  PONV Risk Score and Plan: 2 and TIVA and Propofol infusion  Airway Management Planned: Nasal Cannula and Natural Airway  Additional Equipment:   Intra-op Plan:   Post-operative Plan:   Informed Consent: I have reviewed the patients History and Physical, chart, labs and discussed the procedure including the risks, benefits and alternatives for the proposed anesthesia with the patient or authorized representative who has indicated his/her understanding and acceptance.     Dental Advisory Given  Plan Discussed with: Anesthesiologist, CRNA and Surgeon  Anesthesia Plan Comments:         Anesthesia Quick Evaluation

## 2021-01-16 NOTE — H&P (Signed)
David Bellows, MD [address], [place], [place], [place], [address] [address], [place], [place], [place], [address] Phone: [phone]  Fax: [phone]  Primary Care Physician:  [name], MD   Pre-Procedure History & Physical: HPI:  David Skinner is a 70 y.o. male is here for an colonoscopy.   Past Medical History:  Diagnosis Date   Allergy    Anxiety    Atopy    Cataract of right eye    [place]   Chronic constipation    Elevated LFTs    Fatty liver    GERD (gastroesophageal reflux disease)    Hyperlipidemia    Hypertension    Hypoglycemia    Insomnia    Memory change    Overweight    Paresthesia of hand    Seizure ([place])    12/12, 09/27/14, 02/28/16   Tenosynovitis of finger    Tinea cruris    Vitamin D deficiency     Past Surgical History:  Procedure Laterality Date   COLONOSCOPY WITH PROPOFOL N/A 03/25/2017   Procedure: COLONOSCOPY WITH PROPOFOL;  Surgeon: Lin Landsman, MD;  Location: [place];  Service: Endoscopy;  Laterality: N/A;   ESOPHAGOGASTRODUODENOSCOPY N/A 03/25/2017   Procedure: ESOPHAGOGASTRODUODENOSCOPY (EGD);  Surgeon: Lin Landsman, MD;  Location: [place];  Service: Endoscopy;  Laterality: N/A;  Diabetic - oral meds   LASIK Bilateral     Prior to Admission medications   Medication Sig Start Date End Date Taking? Authorizing Provider  aspirin EC 81 MG tablet Take 1 tablet (81 mg total) by mouth daily. 06/15/15  Yes Sowles, Drue Stager, MD  atorvastatin (LIPITOR) 80 MG tablet TAKE 1 TABLET BY MOUTH EVERY DAY 04/04/20  Yes Sowles, Drue Stager, MD  dicyclomine (BENTYL) 20 MG tablet Take 20 mg by mouth 4 (four) times daily. 08/07/20  Yes [provider]  donepezil (ARICEPT) 5 MG tablet Take 5 mg by mouth at bedtime.  12/24/18  Yes [provider]  doxycycline (ADOXA) 50 MG tablet Take 1 tablet (50 mg total) by mouth daily. Take with food and drink 01/25/20  Yes Ralene Bathe,  MD  DULoxetine (CYMBALTA) 60 MG capsule Take 1 capsule (60 mg total) by mouth daily. 09/11/20  Yes Sowles, Drue Stager, MD  fluticasone (FLONASE) 50 MCG/ACT nasal spray Place 2 sprays into both nostrils daily. 09/11/20  Yes Sowles, Drue Stager, MD  loratadine (CLARITIN) 10 MG tablet Take 1 tablet (10 mg total) by mouth 2 (two) times daily. 02/09/19  Yes Sowles, Drue Stager, MD  losartan (COZAAR) 25 MG tablet Take 1 tablet (25 mg total) by mouth daily. 09/11/20  Yes Sowles, Drue Stager, MD  mometasone (ELOCON) 0.1 % lotion Apply topically as directed. Apply 1-2 gtts to aa itchy scalp qd up to 4 days a week 01/25/20  Yes Ralene Bathe, MD  montelukast (SINGULAIR) 10 MG tablet TAKE 1 TABLET DAILY 02/27/20  Yes [name], MD  silodosin (RAPAFLO) 8 MG CAPS capsule Take 1 capsule (8 mg total) by mouth daily with breakfast. 05/19/20  Yes Stoioff, Ronda Fairly, MD  tadalafil (CIALIS) 20 MG tablet 0.5-1 tab p.o. 1 hour prior to intercourse 05/19/20  Yes Stoioff, Ronda Fairly, MD  traZODone (DESYREL) 100 MG tablet Take 1 tablet (100 mg total) by mouth at bedtime. 09/11/20  Yes Sowles, Drue Stager, MD  Vitamin D, Ergocalciferol, (DRISDOL) 1.25 MG (50000 UNIT) CAPS capsule TAKE 1 CAPSULE EVERY 7 DAYS 10/04/20  Yes [name], MD  albuterol (VENTOLIN  HFA) 108 (90 Base) MCG/ACT inhaler TAKE 2 PUFFS BY MOUTH EVERY 6 HOURS AS NEEDED FOR WHEEZE OR SHORTNESS OF BREATH 10/26/19   [name], MD  Blood Glucose Monitoring Suppl (ACCU-CHEK AVIVA PLUS) w/Device KIT 1 kit by Does not apply route daily. 09/22/18   [name], MD  glucose blood (ACCU-CHEK AVIVA PLUS) test strip 1 each by Other route 2 (two) times daily as needed for other. c 08/06/18   [name], MD  ketoconazole (NIZORAL) 2 % shampoo Shampoo scalp 3 times a week, let sit 10 minutes and rinse out 01/25/20   Ralene Bathe, MD  Lancets (ACCU-CHEK SOFT [facility]) lancets 1 each by Other route 2 (two) times daily as needed for other. DMII 08/06/18   Sowles, Drue Stager, MD   olopatadine (PATANOL) 0.1 % ophthalmic solution Place 1 drop into both eyes 2 (two) times daily. 09/11/20   [name], MD    Allergies as of 01/03/2021 - Review Complete 01/03/2021  Allergen Reaction Noted   Ibuprofen Itching 11/28/2014   Levofloxacin  11/28/2014   Lisinopril Cough 11/28/2014   Metoprolol  11/28/2014   Nsaids Itching 03/08/2015   Tolmetin Itching 03/08/2015   Tramadol hcl Itching 11/28/2014   Tylenol [acetaminophen] Itching 05/18/2020    Family History  Problem Relation Age of Onset   Diabetes Mother    Heart disease Mother     Social History   Socioeconomic History   Marital status: Married    Spouse name: [name]   Number of children: 3   Years of education: Not on file   Highest education level: 12th grade  Occupational History   Occupation: Retired   Tobacco Use   Smoking status: Former    Packs/day: 1.00    Years: 15.00    Pack years: 15.00    Types: Cigarettes    Start date: 06/17/1984    Quit date: 11/28/1999    Years since quitting: 21.1   Smokeless tobacco: Never  Vaping Use   Vaping Use: Never used  Substance and Sexual Activity   Alcohol use: Yes    Alcohol/week: 0.0 standard drinks    Comment: occassional beer   Drug use: No   Sexual activity: Yes    Partners: Female  Other Topics Concern   Not on file  Social History Narrative   Originally from [place]    Social Determinants of Health   Financial Resource Strain: Low Risk    Difficulty of Paying Living Expenses: Not hard at all  Food Insecurity: No Food Insecurity   Worried About Charity fundraiser in the Last Year: Never true   Arboriculturist in the Last Year: Never true  Transportation Needs: No Transportation Needs   Lack of Transportation (Medical): No   Lack of Transportation (Non-Medical): No  Physical Activity: Sufficiently Active   Days of Exercise per Week: 5 days   Minutes of Exercise per Session: 60 min  Stress: No Stress Concern Present   Feeling of  Stress : Not at all  Social Connections: Moderately Integrated   Frequency of Communication with Friends and Family: More than three times a week   Frequency of Social Gatherings with Friends and Family: More than three times a week   Attends Religious Services: 1 to 4 times per year   Active Member of [employer] or Organizations: No   Attends Archivist Meetings: Never   Marital Status: Living with partner  Intimate Partner Violence: Not At Risk  Fear of Current or Ex-Partner: No   Emotionally Abused: No   Physically Abused: No   Sexually Abused: No    Review of Systems: See HPI, otherwise negative ROS  Physical Exam: BP 124/87   Pulse 72   Temp (!) 97.2 F (36.2 C) (Temporal)   Resp 17   Ht _0  (1.651 m)   Wt 68 kg   SpO2 100%   BMI 24.96 kg/m  General:   Alert,  pleasant and cooperative in NAD Head:  Normocephalic and atraumatic. Neck:  Supple; no masses or thyromegaly. Lungs:  Clear throughout to auscultation, normal respiratory effort.    Heart:  +S1, +S2, Regular rate and rhythm, No edema. Abdomen:  Soft, nontender and nondistended. Normal bowel sounds, without guarding, and without rebound.   Neurologic:  Alert and  oriented x4;  grossly normal neurologically.  Impression/Plan: Natthew Marlatt is here for an colonoscopy to be performed for constipation Risks, benefits, limitations, and alternatives regarding  colonoscopy have been reviewed with the patient.  Questions have been answered.  All parties agreeable.   David Bellows, MD  01/16/2021, 8:33 AM

## 2021-01-16 NOTE — Transfer of Care (Signed)
Immediate Anesthesia Transfer of Care Note  Patient: David Skinner  Procedure(s) Performed: COLONOSCOPY WITH PROPOFOL  Patient Location: PACU  Anesthesia Type:General  Level of Consciousness: sedated  Airway & Oxygen Therapy: Patient Spontanous Breathing and Patient connected to nasal cannula oxygen  Post-op Assessment: Report given to RN and Post -op Vital signs reviewed and stable  Post vital signs: Reviewed and stable  Last Vitals:  Vitals Value Taken Time  BP 101/75 01/16/21 0904  Temp 36.2 C 01/16/21 0900  Pulse 72 01/16/21 0905  Resp 18 01/16/21 0905  SpO2 95 % 01/16/21 0905  Vitals shown include unvalidated device data.  Last Pain:  Vitals:   01/16/21 0900  TempSrc: Temporal  PainSc:          Complications: No notable events documented.

## 2021-01-17 ENCOUNTER — Encounter: Payer: Self-pay | Admitting: Gastroenterology

## 2021-01-17 LAB — SURGICAL PATHOLOGY

## 2021-01-18 ENCOUNTER — Encounter: Payer: Self-pay | Admitting: Gastroenterology

## 2021-01-24 ENCOUNTER — Encounter: Payer: Self-pay | Admitting: Urology

## 2021-01-24 ENCOUNTER — Encounter: Payer: Self-pay | Admitting: Family Medicine

## 2021-02-12 ENCOUNTER — Other Ambulatory Visit: Payer: Self-pay | Admitting: *Deleted

## 2021-02-12 MED ORDER — GEMTESA 75 MG PO TABS: 75.0000 mg | ORAL_TABLET | Freq: Every day | ORAL | 0 refills | Status: DC

## 2021-02-13 ENCOUNTER — Telehealth: Payer: Self-pay | Admitting: Family Medicine

## 2021-02-13 NOTE — Telephone Encounter (Signed)
Patient notified [name] has been approved. Approval 01/13/2021 until further notice.

## 2021-03-04 ENCOUNTER — Other Ambulatory Visit: Payer: Self-pay | Admitting: Family Medicine

## 2021-03-04 DIAGNOSIS — F325 Major depressive disorder, single episode, in full remission: Secondary | ICD-10-CM

## 2021-03-04 DIAGNOSIS — I10 Essential (primary) hypertension: Secondary | ICD-10-CM

## 2021-03-05 ENCOUNTER — Other Ambulatory Visit: Payer: Self-pay | Admitting: Family Medicine

## 2021-03-05 DIAGNOSIS — J302 Other seasonal allergic rhinitis: Secondary | ICD-10-CM

## 2021-03-05 DIAGNOSIS — J3089 Other allergic rhinitis: Secondary | ICD-10-CM

## 2021-03-06 NOTE — Progress Notes (Signed)
Name: David Skinner   MRN: [id number]    DOB: Mar 21, 1951   Date:03/07/2021       Progress Note  Subjective  Chief Complaint  Follow up/Consult vaccinations   I connected with  David Skinner  on 03/07/21 at  9:00 AM EDT by a video enabled telemedicine application and verified that I am speaking with the correct person using two identifiers.  I discussed the limitations of evaluation and management by telemedicine and the availability of in person appointments. The patient expressed understanding and agreed to proceed with the virtual visit  Staff also discussed with the patient that there may be a patient responsible charge related to this service. Patient Location: at home  Provider Location: home Additional Individuals present: wife , interpreter   HPI  Follow up   vietnamese interpreter: Phat ID # [phone]    HPI   Change in Bowel movements: he was seen by GI and had a colonoscopy done that showed some polyps and diverticulosis but otherwise normal He states bowel movements are back to normal   Rash: on groin and lower abdomen, very itchy, going on for the past couple of months. He will send me pictures. He is using peroxide and it helps . We will try anti-fungal   Travelling abroad: he is going to [place] next week and asked about vaccines, explained he needs to contact the black box in [place] depression recurrent : going on for years but  worse in  2018, he is on Cymbalta since April 2018, he states he seems to be feeling well, he needs refills of his medicaitons.   Hyperlipidemia :he is taking Atorvastatin  80 mg daily No myalgia or chest pain. Last LDL was down to 59 . He will stop at the office for labs this week.    OSA/minimal/RLS: he sees Dr Manuella Ghazi for memory loss and had sleep study 2018 it showed minimal sleep apnea and RLS - seeing Dr. Manuella Ghazi.   HTN: he is on lower dose of bp medication and doing well, no chest pain, palpitation or dizziness  Alcoholism :  he used to drink heavy while in [place] used to drink liquor at least 3 times a week and used to get drunk, when he moved to [place] ( 1979) . He was still drinking better but quit in  2017. He states he started drinking alcohol again, and on his last visit was drinkin beer and hard liquor, but no alcohol since early 2022   Urinary frequency: seen by Urologist and states medication is working well for him now. however he was also given Cialis for ED but is not having problems with ejaculation, explained he needs to go back. We will give him the number to go back    Partial Seizure: Last episode 02/2016 and went to EC,taking medication daily now and is seeing Dr. Manuella Ghazi. No recent episodes, and he denies drinking recently, he was recently seen by neurologist    Diabetes Type 2 diet controlled with ED, hgbA1C had gone up to 6.9% and we started Metformin back in 10/2015 but he has been off medications for months .  A1C has been stable, but up to 6.4 % it was 6 % last visit  he  has denies polyphagia or polydipsia, has urinary frequency due to LUTS  Continues statin therapy. 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 ([place]) 03/09/2015   Depression with anxiety 11/28/2014   Chronic constipation 11/28/2014   Insomnia 11/28/2014   Dyslipidemia 11/28/2014   Fatty infiltration of liver 11/28/2014   Essential (primary) hypertension 11/28/2014   Gastric reflux  11/28/2014   Memory loss or impairment 11/28/2014   Perennial allergic rhinitis with seasonal variation 11/28/2014   Vitamin D deficiency 11/28/2014   Tenosynovitis of thumb 11/28/2014   Partial epilepsy with impairment of consciousness ([place]) 11/16/2014    Past Surgical History:  Procedure Laterality Date   COLONOSCOPY WITH PROPOFOL N/A 03/25/2017   Procedure: COLONOSCOPY WITH PROPOFOL;  Surgeon: Lin Landsman, MD;  Location: [facility];  Service: Endoscopy;  Laterality: N/A;   COLONOSCOPY WITH PROPOFOL N/A 01/16/2021   Procedure: COLONOSCOPY WITH PROPOFOL;  Surgeon: [name], MD;  Location: [facility] ENDOSCOPY;  Service: Gastroenterology;  Laterality: N/A;  USE VIETNAMESE VIDEO INTERPRETER   ESOPHAGOGASTRODUODENOSCOPY N/A 03/25/2017   Procedure: ESOPHAGOGASTRODUODENOSCOPY (EGD);  Surgeon: Lin Landsman, MD;  Location: [facility];  Service: Endoscopy;  Laterality: N/A;  Diabetic - oral meds   LASIK Bilateral     Family History  Problem Relation Age of Onset   Diabetes Mother    Heart disease Mother     Social History   Socioeconomic History   Marital status: Married    Spouse name: David Skinner   Number of children: 3   Years of education: Not on file   Highest education level: 12th grade  Occupational History   Occupation: Retired   Tobacco Use   Smoking status: Former    Packs/day: 1.00    Years: 15.00    Pack years: 15.00    Types: Cigarettes    Start date: 06/17/1984    Quit date: 11/28/1999    Years since quitting: 21.2   Smokeless tobacco: Never  Vaping Use   Vaping Use: Never used  Substance and Sexual Activity   Alcohol use: Yes    Alcohol/week: 0.0 standard drinks    Comment: occassional beer   Drug use: No   Sexual activity: Yes    Partners: Female  Other Topics Concern   Not on file  Social History Narrative   Originally from [place]    Social Determinants of Health   Financial Resource Strain: Low Risk    Difficulty of Paying  Living Expenses: Not hard at all  Food Insecurity: No Food Insecurity   Worried About Charity fundraiser in the Last Year: Never true   Arboriculturist in the Last Year: Never true  Transportation Needs: No Transportation Needs   Lack of Transportation (Medical): No   Lack of Transportation (Non-Medical): No  Physical Activity: Sufficiently Active   Days of Exercise per Week: 5 days   Minutes of Exercise per Session: 60 min  Stress: No Stress Concern Present   Feeling of Stress : Not at all  Social Connections: Moderately Integrated   Frequency of Communication with Friends and Family: More than three times a week   Frequency of Social  Gatherings with Friends and Family: More than three times a week   Attends Religious Services: 1 to 4 times per year   Active Member of [employer] or Organizations: No   Attends Music therapist: Never   Marital Status: Living with partner  Intimate Partner Violence: Not At Risk   Fear of Current or Ex-Partner: No   Emotionally Abused: No   Physically Abused: No   Sexually Abused: No     Current Outpatient Medications:    albuterol (VENTOLIN HFA) 108 (90 Base) MCG/ACT inhaler, TAKE 2 PUFFS BY MOUTH EVERY 6 HOURS AS NEEDED FOR WHEEZE OR SHORTNESS OF BREATH, Disp: 18 g, Rfl: 0   aspirin EC 81 MG tablet, Take 1 tablet (81 mg total) by mouth daily., Disp: 30 tablet, Rfl: 0   atorvastatin (LIPITOR) 80 MG tablet, TAKE 1 TABLET BY MOUTH EVERY DAY, Disp: 90 tablet, Rfl: 3   Blood Glucose Monitoring Suppl (ACCU-CHEK AVIVA PLUS) w/Device KIT, 1 kit by Does not apply route daily., Disp: 1 kit, Rfl: 0   dicyclomine (BENTYL) 20 MG tablet, Take 20 mg by mouth 4 (four) times daily., Disp: , Rfl:    donepezil (ARICEPT) 5 MG tablet, Take 5 mg by mouth at bedtime. , Disp: , Rfl:    doxycycline (ADOXA) 50 MG tablet, Take 1 tablet (50 mg total) by mouth daily. Take with food and drink, Disp: 30 tablet, Rfl: 6   DULoxetine (CYMBALTA) 60 MG capsule, TAKE 1  CAPSULE BY MOUTH EVERY DAY, Disp: 90 capsule, Rfl: 1   fluticasone (FLONASE) 50 MCG/ACT nasal spray, SPRAY 2 SPRAYS INTO EACH NOSTRIL EVERY DAY, Disp: 48 mL, Rfl: 1   glucose blood (ACCU-CHEK AVIVA PLUS) test strip, 1 each by Other route 2 (two) times daily as needed for other. c, Disp: 100 each, Rfl: 3   ketoconazole (NIZORAL) 2 % shampoo, Shampoo scalp 3 times a week, let sit 10 minutes and rinse out, Disp: 120 mL, Rfl: 6   Lancets (ACCU-CHEK SOFT TOUCH) lancets, 1 each by Other route 2 (two) times daily as needed for other. DMII, Disp: 100 each, Rfl: 3   loratadine (CLARITIN) 10 MG tablet, Take 1 tablet (10 mg total) by mouth 2 (two) times daily., Disp: 180 tablet, Rfl: 1   losartan (COZAAR) 25 MG tablet, TAKE 1 TABLET (25 MG TOTAL) BY MOUTH DAILY., Disp: 90 tablet, Rfl: 1   mometasone (ELOCON) 0.1 % lotion, Apply topically as directed. Apply 1-2 gtts to aa itchy scalp qd up to 4 days a week, Disp: 60 mL, Rfl: 0   montelukast (SINGULAIR) 10 MG tablet, TAKE 1 TABLET DAILY, Disp: 90 tablet, Rfl: 2   olopatadine (PATANOL) 0.1 % ophthalmic solution, Place 1 drop into both eyes 2 (two) times daily., Disp: 5 mL, Rfl: 2   silodosin (RAPAFLO) 8 MG CAPS capsule, Take 1 capsule (8 mg total) by mouth daily with breakfast., Disp: 30 capsule, Rfl: 11   tadalafil (CIALIS) 20 MG tablet, 0.5-1 tab p.o. 1 hour prior to intercourse, Disp: 30 tablet, Rfl: 3   traZODone (DESYREL) 100 MG tablet, Take 1 tablet (100 mg total) by mouth at bedtime., Disp: 90 tablet, Rfl: 1   Vibegron (GEMTESA) 75 MG TABS, Take 75 mg by mouth daily., Disp: 30 tablet, Rfl: 0   Vitamin D, Ergocalciferol, (DRISDOL) 1.25 MG (50000 UNIT) CAPS capsule, TAKE 1 CAPSULE EVERY 7 DAYS, Disp: 12 capsule, Rfl: 1  Allergies  Allergen Reactions   Ibuprofen Itching   Levofloxacin  Lisinopril Cough   Metoprolol    Nsaids Itching   Tolmetin Itching   Tramadol Hcl Itching   Tylenol [Acetaminophen] Itching    I personally reviewed active problem  list, medication list, allergies, family history, social history, health maintenance with the patient/caregiver today.   ROS  Ten systems reviewed and is negative except as mentioned in HPI   Objective  Virtual encounter, vitals not obtained.   There is no height or weight on file to calculate BMI.  Physical Exam  Awake, alert and oriented   No results found for this or any previous visit (from the past 31 hour(s)).  PHQ2/9: Depression screen [facility] 2/9 09/11/2020 05/18/2020 03/10/2020 11/09/2019 10/08/2019  Decreased Interest 0 0 1 0 0  Down, Depressed, Hopeless 0 0 0 0 0  PHQ - 2 Score 0 0 1 0 0  Altered sleeping 3 - 0 1 0  Tired, decreased energy 2 - 2 0 3  Change in appetite 1 - 3 0 3  Feeling bad or failure about yourself  0 - 0 0 0  Trouble concentrating 0 - 0 0 0  Moving slowly or fidgety/restless 0 - 0 0 0  Suicidal thoughts 0 - 0 0 0  PHQ-9 Score 6 - _0 Difficult doing work/chores - - Somewhat difficult Not difficult at all Not difficult at all  Some recent data might be hidden   PHQ-2/9 Result is negative.    Fall Risk: Fall Risk  09/11/2020 05/18/2020 03/10/2020 11/09/2019 10/08/2019  Falls in the past year? 0 0 0 0 0  Number falls in past yr: 0 0 0 - 0  Injury with Fall? 0 0 0 - 0  Risk for fall due to : - History of fall(s) - - -  Follow up - Falls prevention discussed - - -     Assessment & Plan  1. 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 Essential (primary) hypertension  - CBC with Differential/Platelet - COMPLETE METABOLIC PANEL WITH GFR  3. Major depression in remission ([place])  Continue medications  4. Partial epilepsy with impairment of consciousness ([place])   5. Primary insomnia  - traZODone (DESYREL) 100 MG tablet; Take 1 tablet (100 mg total) by mouth at bedtime.  Dispense: 90 tablet; Refill: 0  6. Perennial allergic rhinitis with seasonal variation  - montelukast (SINGULAIR) 10  MG tablet; Take 1 tablet (10 mg total) by mouth daily.  Dispense: 90 tablet; Refill: 0  7. Dyslipidemia  - atorvastatin (LIPITOR) 80 MG tablet; Take 1 tablet (80 mg total) by mouth daily.  Dispense: 90 tablet; Refill: 0 - Lipid panel  8. Vitamin D deficiency  - Vitamin D, Ergocalciferol, (DRISDOL) 1.25 MG (50000 UNIT) CAPS capsule; TAKE 1 CAPSULE EVERY 7 DAYS  Dispense: 12 capsule; Refill: 1 - VITAMIN D 25 Hydroxy (Vit-D Deficiency, Fractures)   I discussed the assessment and treatment plan with the patient. The patient was provided an opportunity to ask questions and all were answered. The patient agreed with the plan and demonstrated an understanding of the instructions.  The patient was advised to call back or seek an in-person evaluation if the symptoms worsen or if the condition fails to improve as anticipated.  I provided 25 minutes of non-face-to-face time during this encounter.

## 2021-03-07 ENCOUNTER — Telehealth (INDEPENDENT_AMBULATORY_CARE_PROVIDER_SITE_OTHER): Payer: Medicare Other | Admitting: Family Medicine

## 2021-03-07 ENCOUNTER — Telehealth: Payer: Self-pay

## 2021-03-07 ENCOUNTER — Encounter: Payer: Self-pay | Admitting: Family Medicine

## 2021-03-07 VITALS — Ht 65.0 in | Wt 150.0 lb

## 2021-03-07 DIAGNOSIS — F5101 Primary insomnia: Secondary | ICD-10-CM

## 2021-03-07 DIAGNOSIS — J3089 Other allergic rhinitis: Secondary | ICD-10-CM | POA: Diagnosis not present

## 2021-03-07 DIAGNOSIS — G40209 Localization-related (focal) (partial) symptomatic epilepsy and epileptic syndromes with complex partial seizures, not intractable, without status epilepticus: Secondary | ICD-10-CM

## 2021-03-07 DIAGNOSIS — J302 Other seasonal allergic rhinitis: Secondary | ICD-10-CM | POA: Diagnosis not present

## 2021-03-07 DIAGNOSIS — E559 Vitamin D deficiency, unspecified: Secondary | ICD-10-CM | POA: Diagnosis not present

## 2021-03-07 DIAGNOSIS — E785 Hyperlipidemia, unspecified: Secondary | ICD-10-CM | POA: Diagnosis not present

## 2021-03-07 DIAGNOSIS — I10 Essential (primary) hypertension: Secondary | ICD-10-CM

## 2021-03-07 DIAGNOSIS — E1140 Type 2 diabetes mellitus with diabetic neuropathy, unspecified: Secondary | ICD-10-CM

## 2021-03-07 DIAGNOSIS — N521 Erectile dysfunction due to diseases classified elsewhere: Secondary | ICD-10-CM | POA: Diagnosis not present

## 2021-03-07 DIAGNOSIS — F325 Major depressive disorder, single episode, in full remission: Secondary | ICD-10-CM

## 2021-03-07 MED ORDER — ATORVASTATIN CALCIUM 80 MG PO TABS: 80.0000 mg | ORAL_TABLET | Freq: Every day | ORAL | 0 refills | Status: DC

## 2021-03-07 MED ORDER — FLUCONAZOLE 150 MG PO TABS: 150.0000 mg | ORAL_TABLET | ORAL | 0 refills | Status: DC

## 2021-03-07 MED ORDER — MONTELUKAST SODIUM 10 MG PO TABS: 10.0000 mg | ORAL_TABLET | Freq: Every day | ORAL | 0 refills | Status: DC

## 2021-03-07 MED ORDER — TRAZODONE HCL 100 MG PO TABS: 100.0000 mg | ORAL_TABLET | Freq: Every day | ORAL | 0 refills | Status: DC

## 2021-03-07 MED ORDER — VITAMIN D (ERGOCALCIFEROL) 1.25 MG (50000 UNIT) PO CAPS: ORAL_CAPSULE | ORAL | 1 refills | Status: DC

## 2021-03-07 NOTE — Telephone Encounter (Signed)
Pt needs a 3 month followup per sowles. VM not set up

## 2021-03-21 ENCOUNTER — Other Ambulatory Visit: Payer: Self-pay | Admitting: Family Medicine

## 2021-03-21 DIAGNOSIS — E785 Hyperlipidemia, unspecified: Secondary | ICD-10-CM

## 2021-03-25 ENCOUNTER — Other Ambulatory Visit: Payer: Self-pay | Admitting: Family Medicine

## 2021-03-25 DIAGNOSIS — F5101 Primary insomnia: Secondary | ICD-10-CM

## 2021-03-25 NOTE — Telephone Encounter (Signed)
Last RF 03/07/21 #90

## 2021-05-22 ENCOUNTER — Ambulatory Visit: Payer: Medicare Other

## 2021-05-29 ENCOUNTER — Encounter: Payer: Self-pay | Admitting: Urology

## 2021-06-05 NOTE — Progress Notes (Signed)
Name: David Skinner   MRN: [id number]    DOB: 1950-10-02   Date:06/06/2021       Progress Note  Subjective  Chief Complaint  Follow Up  HPI  Translator: Claiborne Billings  Rash: on groin and lower abdomen, very itchy, going on for many months.. We gave Diflucan but did not work , we will refer him back to dermatologist   Major depression recurrent : going on for years but  worse in  2018, he is on Cymbalta since April 2018, he states he seems to be feeling well, he needs refills of his medicaitons. He went to [place] for a wedding and had a good time.   Hyperlipidemia :he is taking Atorvastatin  80 mg daily No myalgia or chest pain. Last LDL was down to 59 . We will recheck labs today    OSA/minimal/RLS: he sees Dr Manuella Ghazi for memory loss and had sleep study 2018 it showed minimal sleep apnea and RLS - seeing Dr. Manuella Ghazi. Advised to keep up with visits    HTN: he is on lower dose of bp medication and doing well, no chest pain, palpitation or dizziness. BP is stable.   Alcoholism : he used to drink heavy while in [place] used to drink liquor at least 3 times a week and used to get drunk, when he moved to [place] ( 1979) . He was still drinking in [place] usually beer 2017. He states he started drinking alcohol again, and on his last visit was drinkin beer and hard liquor, but no alcohol since early 2022, however went to wedding in [place] and spent 30 days drinking a 6 pack of beer daily . He has not drank since he got back Nov 2022    Urinary frequency: seen by Urologist and states medication is working well for him now. He needs to follow up with Dr. Bernardo Heater    Partial Seizure: Last episode 02/2016 and went to [facility],taking medication daily now and is seeing Dr. Manuella Ghazi. No recent episodes, reminded him alcohol can trigger seizures    Diabetes Type 2 diet controlled with ED, hgbA1C had gone up to 6.9% and we started Metformin back in 10/2015 but he has been off medications for months . He  denies polyphagia or  polydipsia, has urinary frequency due to LUTS  Continues statin therapy. He will have labs today.    Insomnia: he sleeps well with Trazodone , he needs a refill   Dry cough: started last week, causing some dry throat but no pain, no fever or chills.   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 ([place]) 03/09/2015   Depression with anxiety 11/28/2014   Chronic constipation 11/28/2014   Insomnia 11/28/2014   Dyslipidemia 11/28/2014   Fatty infiltration of liver 11/28/2014   Essential (primary) hypertension 11/28/2014   Gastric reflux 11/28/2014   Memory loss or impairment 11/28/2014   Perennial allergic rhinitis with seasonal variation 11/28/2014   Vitamin D deficiency 11/28/2014   Tenosynovitis of thumb 11/28/2014   Partial epilepsy with impairment of consciousness ([place]) 11/16/2014    Past Surgical  History:  Procedure Laterality Date   COLONOSCOPY WITH PROPOFOL N/A 03/25/2017   Procedure: COLONOSCOPY WITH PROPOFOL;  Surgeon: Lin Landsman, MD;  Location: [facility];  Service: Endoscopy;  Laterality: N/A;   COLONOSCOPY WITH PROPOFOL N/A 01/16/2021   Procedure: COLONOSCOPY WITH PROPOFOL;  Surgeon: [name], MD;  Location: [facility] ENDOSCOPY;  Service: Gastroenterology;  Laterality: N/A;  USE VIETNAMESE VIDEO INTERPRETER   ESOPHAGOGASTRODUODENOSCOPY N/A 03/25/2017   Procedure: ESOPHAGOGASTRODUODENOSCOPY (EGD);  Surgeon: Lin Landsman, MD;  Location: [facility];  Service: Endoscopy;  Laterality: N/A;  Diabetic -  oral meds   LASIK Bilateral     Family History  Problem Relation Age of Onset   Diabetes Mother    Heart disease Mother     Social History   Tobacco Use   Smoking status: Former    Packs/day: 1.00    Years: 15.00    Pack years: 15.00    Types: Cigarettes    Start date: 06/17/1984    Quit date: 11/28/1999    Years since quitting: 21.5   Smokeless tobacco: Never  Substance Use Topics   Alcohol use: Yes    Alcohol/week: 0.0 standard drinks    Comment: occassional beer     Current Outpatient Medications:    albuterol (VENTOLIN HFA) 108 (90 Base) MCG/ACT inhaler, TAKE 2 PUFFS BY MOUTH EVERY 6 HOURS AS NEEDED FOR WHEEZE OR SHORTNESS OF BREATH, Disp: 18 g, Rfl: 0   aspirin EC 81 MG tablet, Take 1 tablet (81 mg total) by mouth daily., Disp: 30 tablet, Rfl: 0   atorvastatin (LIPITOR) 80 MG tablet, TAKE 1 TABLET BY MOUTH EVERY DAY, Disp: 90 tablet, Rfl: 3   Blood Glucose Monitoring Suppl (ACCU-CHEK AVIVA PLUS) w/Device KIT, 1 kit by Does not apply route daily., Disp: 1 kit, Rfl: 0   dicyclomine (BENTYL) 20 MG tablet, Take 20 mg by mouth 4 (four) times daily., Disp: , Rfl:    donepezil (ARICEPT) 5 MG tablet, Take 5 mg by mouth at bedtime. , Disp: , Rfl:    doxycycline (ADOXA) 50 MG tablet, Take 1 tablet (50 mg total) by mouth daily. Take with food and drink, Disp: 30 tablet, Rfl: 6   DULoxetine (CYMBALTA) 60 MG capsule, TAKE 1 CAPSULE BY MOUTH EVERY DAY, Disp: 90 capsule, Rfl: 1   fluconazole (DIFLUCAN) 150 MG tablet, Take 1 tablet (150 mg total) by mouth every other day., Disp: 3 tablet, Rfl: 0   fluticasone (FLONASE) 50 MCG/ACT nasal spray, SPRAY 2 SPRAYS INTO EACH NOSTRIL EVERY DAY, Disp: 48 mL, Rfl: 1   glucose blood (ACCU-CHEK AVIVA PLUS) test strip, 1 each by Other route 2 (two) times daily as needed for other. c, Disp: 100 each, Rfl: 3   ketoconazole (NIZORAL) 2 % shampoo, Shampoo scalp 3 times a week, let sit 10 minutes and rinse out, Disp: 120 mL, Rfl: 6   Lancets (ACCU-CHEK SOFT  TOUCH) lancets, 1 each by Other route 2 (two) times daily as needed for other. DMII, Disp: 100 each, Rfl: 3   loratadine (CLARITIN) 10 MG tablet, Take 1 tablet (10 mg total) by mouth 2 (two) times daily., Disp: 180 tablet, Rfl: 1   losartan (COZAAR) 25 MG tablet, TAKE 1 TABLET (25 MG TOTAL) BY MOUTH DAILY., Disp: 90 tablet, Rfl: 1   mometasone (ELOCON) 0.1 % lotion, Apply topically as directed. Apply 1-2 gtts to aa itchy scalp qd up to 4 days a week, Disp: 60 mL, Rfl: 0   montelukast (SINGULAIR) 10  MG tablet, Take 1 tablet (10 mg total) by mouth daily., Disp: 90 tablet, Rfl: 0   olopatadine (PATANOL) 0.1 % ophthalmic solution, Place 1 drop into both eyes 2 (two) times daily., Disp: 5 mL, Rfl: 2   silodosin (RAPAFLO) 8 MG CAPS capsule, Take 1 capsule (8 mg total) by mouth daily with breakfast., Disp: 30 capsule, Rfl: 11   tadalafil (CIALIS) 20 MG tablet, 0.5-1 tab p.o. 1 hour prior to intercourse, Disp: 30 tablet, Rfl: 3   traZODone (DESYREL) 100 MG tablet, Take 1 tablet (100 mg total) by mouth at bedtime., Disp: 90 tablet, Rfl: 0   Vibegron (GEMTESA) 75 MG TABS, Take 75 mg by mouth daily., Disp: 30 tablet, Rfl: 0   Vitamin D, Ergocalciferol, (DRISDOL) 1.25 MG (50000 UNIT) CAPS capsule, TAKE 1 CAPSULE EVERY 7 DAYS, Disp: 12 capsule, Rfl: 1  Allergies  Allergen Reactions   Ibuprofen Itching   Levofloxacin    Lisinopril Cough   Metoprolol    Nsaids Itching   Tolmetin Itching   Tramadol Hcl Itching   Tylenol [Acetaminophen] Itching    I personally reviewed active problem list, medication list, allergies, family history, social history, health maintenance with the patient/caregiver today.   ROS  Constitutional: Negative for fever or weight change.  Respiratory: positive for cough but no  shortness of breath.   Cardiovascular: Negative for chest pain or palpitations.  Gastrointestinal: Negative for abdominal pain, no bowel changes.  Musculoskeletal: Negative for gait problem or joint  swelling.  Skin: positive or rash.  Neurological: Negative for dizziness or headache.  No other specific complaints in a complete review of systems (except as listed in HPI above).   Objective  Vitals:   06/06/21 1010  BP: 112/70  Pulse: 79  Resp: 16  Temp: 97.8 F (36.6 C)  SpO2: 99%  Weight: 154 lb (69.9 kg)  Height: _0  (1.651 m)    Body mass index is 25.63 kg/m.  Physical Exam  Constitutional: Patient appears well-developed and well-nourished.  No distress.  HEENT: head atraumatic, normocephalic, pupils equal and reactive to light, neck supple Cardiovascular: Normal rate, regular rhythm and normal heart sounds.  No murmur heard. No BLE edema. Pulmonary/Chest: Effort normal and breath sounds normal. No respiratory distress. Abdominal: Soft.  There is no tenderness. Psychiatric: Patient has a normal mood and affect. behavior is normal. Judgment and thought content normal.   PHQ2/9: Depression screen [facility] 2/9 06/06/2021 03/07/2021 09/11/2020 05/18/2020 03/10/2020  Decreased Interest 0 0 0 0 1  Down, Depressed, Hopeless 0 0 0 0 0  PHQ - 2 Score 0 0 0 0 1  Altered sleeping 0 - 3 - 0  Tired, decreased energy 0 - 2 - 2  Change in appetite 1 - 1 - 3  Feeling bad or failure about yourself  0 - 0 - 0  Trouble concentrating 0 - 0 - 0  Moving slowly or fidgety/restless 0 - 0 - 0  Suicidal thoughts 0 - 0 - 0  PHQ-9 Score 1 - 6 - 6  Difficult doing work/chores - - - - Somewhat difficult  Some recent data might be hidden    phq 9 is negative   Fall Risk: Fall Risk  06/06/2021 03/07/2021 09/11/2020 05/18/2020 03/10/2020  Falls in the past year? 0 0 0 0 0  Number falls in past yr: 0 - 0 0 0  Injury with Fall? 0 - 0 0 0  Risk for fall due to : No Fall Risks - -  History of fall(s) -  Follow up Falls prevention discussed Falls prevention discussed - Falls prevention discussed -      Functional Status Survey: Is the patient deaf or have difficulty hearing?: No Does the patient  have difficulty seeing, even when wearing glasses/contacts?: No Does the patient have difficulty concentrating, remembering, or making decisions?: No Does the patient have difficulty walking or climbing stairs?: No Does the patient have difficulty dressing or bathing?: No Does the patient have difficulty doing errands alone such as visiting a doctor's office or shopping?: No    Assessment & Plan  1. Major depression in remission (HCC)  - DULoxetine (CYMBALTA) 60 MG capsule; TAKE 1 CAPSULE BY MOUTH EVERY DAY  Dispense: 90 capsule; Refill: 0  2. 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 History of alcoholism ([place])  Early remission again, he drank while on vacation   4. Dyslipidemia   5. Essential (primary) hypertension  - losartan (COZAAR) 25 MG tablet; Take 1 tablet (25 mg total) by mouth daily.  Dispense: 90 tablet; Refill: 0  6. Primary insomnia  - traZODone (DESYREL) 100 MG tablet; Take 1 tablet (100 mg total) by mouth at bedtime.  Dispense: 90 tablet; Refill: 0  7. Perennial allergic rhinitis with seasonal variation  - olopatadine (PATANOL) 0.1 % ophthalmic solution; Place 1 drop into both eyes 2 (two) times daily.  Dispense: 5 mL; Refill: 2  8. Itchy eyes  - olopatadine (PATANOL) 0.1 % ophthalmic solution; Place 1 drop into both eyes 2 (two) times daily.  Dispense: 5 mL; Refill: 2  9. OSA (obstructive sleep apnea)   10. Vitamin D deficiency   11. Intertrigo  - Ambulatory referral to Dermatology  12. Partial epilepsy with impairment of consciousness ([place])

## 2021-06-06 ENCOUNTER — Ambulatory Visit (INDEPENDENT_AMBULATORY_CARE_PROVIDER_SITE_OTHER): Payer: Medicare Other | Admitting: Family Medicine

## 2021-06-06 ENCOUNTER — Encounter: Payer: Self-pay | Admitting: Family Medicine

## 2021-06-06 VITALS — BP 112/70 | HR 79 | Temp 97.8°F | Resp 16 | Ht 65.0 in | Wt 154.0 lb

## 2021-06-06 DIAGNOSIS — E559 Vitamin D deficiency, unspecified: Secondary | ICD-10-CM

## 2021-06-06 DIAGNOSIS — N521 Erectile dysfunction due to diseases classified elsewhere: Secondary | ICD-10-CM | POA: Diagnosis not present

## 2021-06-06 DIAGNOSIS — F1021 Alcohol dependence, in remission: Secondary | ICD-10-CM | POA: Diagnosis not present

## 2021-06-06 DIAGNOSIS — L304 Erythema intertrigo: Secondary | ICD-10-CM

## 2021-06-06 DIAGNOSIS — F5101 Primary insomnia: Secondary | ICD-10-CM

## 2021-06-06 DIAGNOSIS — J302 Other seasonal allergic rhinitis: Secondary | ICD-10-CM

## 2021-06-06 DIAGNOSIS — E1140 Type 2 diabetes mellitus with diabetic neuropathy, unspecified: Secondary | ICD-10-CM

## 2021-06-06 DIAGNOSIS — E785 Hyperlipidemia, unspecified: Secondary | ICD-10-CM

## 2021-06-06 DIAGNOSIS — I10 Essential (primary) hypertension: Secondary | ICD-10-CM

## 2021-06-06 DIAGNOSIS — F325 Major depressive disorder, single episode, in full remission: Secondary | ICD-10-CM | POA: Diagnosis not present

## 2021-06-06 DIAGNOSIS — R6889 Other general symptoms and signs: Secondary | ICD-10-CM

## 2021-06-06 DIAGNOSIS — J3089 Other allergic rhinitis: Secondary | ICD-10-CM

## 2021-06-06 DIAGNOSIS — G4733 Obstructive sleep apnea (adult) (pediatric): Secondary | ICD-10-CM

## 2021-06-06 DIAGNOSIS — G40209 Localization-related (focal) (partial) symptomatic epilepsy and epileptic syndromes with complex partial seizures, not intractable, without status epilepticus: Secondary | ICD-10-CM

## 2021-06-06 MED ORDER — LOSARTAN POTASSIUM 25 MG PO TABS: 25.0000 mg | ORAL_TABLET | Freq: Every day | ORAL | 0 refills | Status: DC

## 2021-06-06 MED ORDER — OLOPATADINE HCL 0.1 % OP SOLN: 1.0000 [drp] | Freq: Two times a day (BID) | OPHTHALMIC | 2 refills | Status: DC

## 2021-06-06 MED ORDER — DULOXETINE HCL 60 MG PO CPEP: ORAL_CAPSULE | ORAL | 0 refills | Status: DC

## 2021-06-06 MED ORDER — TRAZODONE HCL 100 MG PO TABS: 100.0000 mg | ORAL_TABLET | Freq: Every day | ORAL | 0 refills | Status: DC

## 2021-06-06 NOTE — Patient Instructions (Addendum)
Urology Dr. Bernardo Heater #: [id number] Neurology Dr. Manuella Ghazi #: [phone] Dermatology Dr. Nehemiah Massed #: [phone]

## 2021-06-07 ENCOUNTER — Other Ambulatory Visit: Payer: Self-pay | Admitting: Family Medicine

## 2021-06-07 LAB — COMPLETE METABOLIC PANEL WITH GFR
AG Ratio: 1.5 (calc) (ref 1.0–2.5)
ALT: 36 U/L (ref 9–46)
AST: 21 U/L (ref 10–35)
Albumin: 4.3 g/dL (ref 3.6–5.1)
Alkaline phosphatase (APISO): 63 U/L (ref 35–144)
BUN: 12 mg/dL (ref 7–25)
CO2: 31 mmol/L (ref 20–32)
Calcium: 9.7 mg/dL (ref 8.6–10.3)
Chloride: 104 mmol/L (ref 98–110)
Creat: 0.92 mg/dL (ref 0.70–1.35)
Globulin: 2.9 g/dL (calc) (ref 1.9–3.7)
Glucose, Bld: 99 mg/dL (ref 65–99)
Potassium: 4.2 mmol/L (ref 3.5–5.3)
Sodium: 141 mmol/L (ref 135–146)
Total Bilirubin: 0.8 mg/dL (ref 0.2–1.2)
Total Protein: 7.2 g/dL (ref 6.1–8.1)
eGFR: 91 mL/min/{1.73_m2} (ref >=60)

## 2021-06-07 LAB — CBC WITH DIFFERENTIAL/PLATELET
Absolute Monocytes: 583 cells/uL (ref 200–950)
Basophils Absolute: 29 cells/uL (ref 0–200)
Basophils Relative: 0.4 %
Eosinophils Absolute: 122 cells/uL (ref 15–500)
Eosinophils Relative: 1.7 %
HCT: 47.9 % (ref 38.5–50.0)
Hemoglobin: 16.2 g/dL (ref 13.2–17.1)
Lymphs Abs: 1490 cells/uL (ref 850–3900)
MCH: 32 pg (ref 27.0–33.0)
MCHC: 33.8 g/dL (ref 32.0–36.0)
MCV: 94.7 fL (ref 80.0–100.0)
MPV: 9.1 fL (ref 7.5–12.5)
Monocytes Relative: 8.1 %
Neutro Abs: 4975 cells/uL (ref 1500–7800)
Neutrophils Relative %: 69.1 %
Platelets: 267 10*3/uL (ref 140–400)
RBC: 5.06 10*6/uL (ref 4.20–5.80)
RDW: 12.6 % (ref 11.0–15.0)
Total Lymphocyte: 20.7 %
WBC: 7.2 10*3/uL (ref 3.8–10.8)

## 2021-06-07 LAB — LIPID PANEL
Cholesterol: 157 mg/dL (ref <200)
HDL: 60 mg/dL (ref >=40)
LDL Cholesterol (Calc): 80 mg/dL (calc)
Non-HDL Cholesterol (Calc): 97 mg/dL (calc) (ref <130)
Total CHOL/HDL Ratio: 2.6 (calc) (ref <5.0)
Triglycerides: 86 mg/dL (ref <150)

## 2021-06-07 LAB — HEMOGLOBIN A1C
Hgb A1c MFr Bld: 6.3 % of total Hgb — ABNORMAL HIGH (ref <5.7)
Mean Plasma Glucose: 134 mg/dL
eAG (mmol/L): 7.4 mmol/L

## 2021-06-07 LAB — VITAMIN D 25 HYDROXY (VIT D DEFICIENCY, FRACTURES): Vit D, 25-Hydroxy: 131 ng/mL — ABNORMAL HIGH (ref 30–100)

## 2021-06-08 ENCOUNTER — Encounter: Payer: Self-pay | Admitting: Family Medicine

## 2021-06-08 DIAGNOSIS — R051 Acute cough: Secondary | ICD-10-CM

## 2021-06-08 MED ORDER — BENZONATATE 100 MG PO CAPS
100.0000 mg | ORAL_CAPSULE | Freq: Two times a day (BID) | ORAL | 0 refills | Status: DC | PRN
Start: 2021-06-08 — End: 2021-08-08

## 2021-06-19 ENCOUNTER — Other Ambulatory Visit: Payer: Self-pay

## 2021-06-19 ENCOUNTER — Ambulatory Visit (INDEPENDENT_AMBULATORY_CARE_PROVIDER_SITE_OTHER): Payer: Medicare Other

## 2021-06-19 ENCOUNTER — Ambulatory Visit: Payer: Medicare Other

## 2021-06-19 DIAGNOSIS — Z599 Problem related to housing and economic circumstances, unspecified: Secondary | ICD-10-CM | POA: Diagnosis not present

## 2021-06-19 DIAGNOSIS — Z0000 Encounter for general adult medical examination without abnormal findings: Secondary | ICD-10-CM | POA: Diagnosis not present

## 2021-06-19 DIAGNOSIS — Z23 Encounter for immunization: Secondary | ICD-10-CM

## 2021-06-19 NOTE — Patient Instructions (Signed)
David Skinner , Thank you for taking time to come for your Medicare Wellness Visit. I appreciate your ongoing commitment to your health goals. Please review the following plan we discussed and let me know if I can assist you in the future.   Screening recommendations/referrals: Colonoscopy: done 01/16/21; repeat 01/2028 Recommended yearly ophthalmology/optometry visit for glaucoma screening and checkup Recommended yearly dental visit for hygiene and checkup  Vaccinations: Influenza vaccine: done today Pneumococcal vaccine: done 09/11/20 Tdap vaccine: done 12/02/12 Shingles vaccine: Shingrix discussed. Please contact your pharmacy for coverage information.  Covid-19:  done 07/30/19, 08/27/19, 04/19/20 & 01/03/21  Advanced directives: Advance directive discussed with you today. I have provided a copy for you to complete at home and have notarized. Once this is complete please bring a copy in to our office so we can scan it into your chart.   Conditions/risks identified: Recommend increasing physical activity   Next appointment: Follow up in one year for your annual wellness visit.   Preventive Care 69 Years and Older, Male Preventive care refers to lifestyle choices and visits with your health care provider that can promote health and wellness. What does preventive care include? A yearly physical exam. This is also called an annual well check. Dental exams once or twice a year. Routine eye exams. Ask your health care provider how often you should have your eyes checked. Personal lifestyle choices, including: Daily care of your teeth and gums. Regular physical activity. Eating a healthy diet. Avoiding tobacco and drug use. Limiting alcohol use. Practicing safe sex. Taking low doses of aspirin every day. Taking vitamin and mineral supplements as recommended by your health care provider. What happens during an annual well check? The services and screenings done by your health care provider during  your annual well check will depend on your age, overall health, lifestyle risk factors, and family history of disease. Counseling  Your health care provider may ask you questions about your: Alcohol use. Tobacco use. Drug use. Emotional well-being. Home and relationship well-being. Sexual activity. Eating habits. History of falls. Memory and ability to understand (cognition). Work and work Statistician. Screening  You may have the following tests or measurements: Height, weight, and BMI. Blood pressure. Lipid and cholesterol levels. These may be checked every 5 years, or more frequently if you are over 32 years old. Skin check. Lung cancer screening. You may have this screening every year starting at age 69 if you have a 30-pack-year history of smoking and currently smoke or have quit within the past 15 years. Fecal occult blood test (FOBT) of the stool. You may have this test every year starting at age 69. Flexible sigmoidoscopy or colonoscopy. You may have a sigmoidoscopy every 5 years or a colonoscopy every 10 years starting at age 69. Prostate cancer screening. Recommendations will vary depending on your family history and other risks. Hepatitis C blood test. Hepatitis B blood test. Sexually transmitted disease (STD) testing. Diabetes screening. This is done by checking your blood sugar (glucose) after you have not eaten for a while (fasting). You may have this done every 1-3 years. Abdominal aortic aneurysm (AAA) screening. You may need this if you are a current or former smoker. Osteoporosis. You may be screened starting at age 69 if you are at high risk. Talk with your health care provider about your test results, treatment options, and if necessary, the need for more tests. Vaccines  Your health care provider may recommend certain vaccines, such as: Influenza vaccine. This is recommended  every year. Tetanus, diphtheria, and acellular pertussis (Tdap, Td) vaccine. You may need  a Td booster every 10 years. Zoster vaccine. You may need this after age 76. Pneumococcal 13-valent conjugate (PCV13) vaccine. One dose is recommended after age 69. Pneumococcal polysaccharide (PPSV23) vaccine. One dose is recommended after age 69. Talk to your health care provider about which screenings and vaccines you need and how often you need them. This information is not intended to replace advice given to you by your health care provider. Make sure you discuss any questions you have with your health care provider. Document Released: 06/30/2015 Document Revised: 02/21/2016 Document Reviewed: 04/04/2015 Elsevier Interactive Patient Education  2017 [place] Prevention in the Home Falls can cause injuries. They can happen to people of all ages. There are many things you can do to make your home safe and to help prevent falls. What can I do on the outside of my home? Regularly fix the edges of walkways and driveways and fix any cracks. Remove anything that might make you trip as you walk through a door, such as a raised step or threshold. Trim any bushes or trees on the path to your home. Use bright outdoor lighting. Clear any walking paths of anything that might make someone trip, such as rocks or tools. Regularly check to see if handrails are loose or broken. Make sure that both sides of any steps have handrails. Any raised decks and porches should have guardrails on the edges. Have any leaves, snow, or ice cleared regularly. Use sand or salt on walking paths during winter. Clean up any spills in your garage right away. This includes oil or grease spills. What can I do in the bathroom? Use night lights. Install grab bars by the toilet and in the tub and shower. Do not use towel bars as grab bars. Use non-skid mats or decals in the tub or shower. If you need to sit down in the shower, use a plastic, non-slip stool. Keep the floor dry. Clean up any water that spills on the  floor as soon as it happens. Remove soap buildup in the tub or shower regularly. Attach bath mats securely with double-sided non-slip rug tape. Do not have throw rugs and other things on the floor that can make you trip. What can I do in the bedroom? Use night lights. Make sure that you have a light by your bed that is easy to reach. Do not use any sheets or blankets that are too big for your bed. They should not hang down onto the floor. Have a firm chair that has side arms. You can use this for support while you get dressed. Do not have throw rugs and other things on the floor that can make you trip. What can I do in the kitchen? Clean up any spills right away. Avoid walking on wet floors. Keep items that you use a lot in easy-to-reach places. If you need to reach something above you, use a strong step stool that has a grab bar. Keep electrical cords out of the way. Do not use floor polish or wax that makes floors slippery. If you must use wax, use non-skid floor wax. Do not have throw rugs and other things on the floor that can make you trip. What can I do with my stairs? Do not leave any items on the stairs. Make sure that there are handrails on both sides of the stairs and use them. Fix handrails that are broken  or loose. Make sure that handrails are as long as the stairways. Check any carpeting to make sure that it is firmly attached to the stairs. Fix any carpet that is loose or worn. Avoid having throw rugs at the top or bottom of the stairs. If you do have throw rugs, attach them to the floor with carpet tape. Make sure that you have a light switch at the top of the stairs and the bottom of the stairs. If you do not have them, ask someone to add them for you. What else can I do to help prevent falls? Wear shoes that: Do not have high heels. Have rubber bottoms. Are comfortable and fit you well. Are closed at the toe. Do not wear sandals. If you use a stepladder: Make sure that  it is fully opened. Do not climb a closed stepladder. Make sure that both sides of the stepladder are locked into place. Ask someone to hold it for you, if possible. Clearly mark and make sure that you can see: Any grab bars or handrails. First and last steps. Where the edge of each step is. Use tools that help you move around (mobility aids) if they are needed. These include: Canes. Walkers. Scooters. Crutches. Turn on the lights when you go into a dark area. Replace any light bulbs as soon as they burn out. Set up your furniture so you have a clear path. Avoid moving your furniture around. If any of your floors are uneven, fix them. If there are any pets around you, be aware of where they are. Review your medicines with your doctor. Some medicines can make you feel dizzy. This can increase your chance of falling. Ask your doctor what other things that you can do to help prevent falls. This information is not intended to replace advice given to you by your health care provider. Make sure you discuss any questions you have with your health care provider. Document Released: 03/30/2009 Document Revised: 11/09/2015 Document Reviewed: 07/08/2014 Elsevier Interactive Patient Education  2017 [employer].

## 2021-06-19 NOTE — Progress Notes (Signed)
Subjective:   David Skinner is a 71 y.o. male who presents for Medicare Annual/Subsequent preventive examination.  Virtual Visit via Telephone Note  I connected with  David Skinner on 06/19/21 at  8:40 AM EST by telephone and verified that I am speaking with the correct person using two identifiers.  Location: Patient: home Provider: [place] Persons participating in the virtual visit: [place]   I discussed the limitations, risks, security and privacy concerns of performing an evaluation and management service by telephone and the availability of in person appointments. The patient expressed understanding and agreed to proceed.  Interactive audio and video telecommunications were attempted between this nurse and patient, however failed, due to patient having technical difficulties OR patient did not have access to video capability.  We continued and completed visit with audio only.  Some vital signs may be absent or patient reported.   Clemetine Marker, LPN   Review of Systems     Cardiac Risk Factors include: advanced age (>64mn, >>69women);diabetes mellitus;dyslipidemia;male gender;hypertension     Objective:    There were no vitals filed for this visit. There is no height or weight on file to calculate BMI.  Advanced Directives 06/19/2021 01/16/2021 04/10/2017 03/25/2017 03/03/2017 12/09/2016 10/04/2016  Does Patient Have a Medical Advance Directive? _0  No No  Would patient like information on creating a medical advance directive? Yes (MAU/Ambulatory/Procedural Areas - Information given) No - Patient declined - Yes (MAU/Ambulatory/Procedural Areas - Information given) - - -    Current Medications (verified) Outpatient Encounter Medications as of 06/19/2021  Medication Sig   albuterol (VENTOLIN HFA) 108 (90 Base) MCG/ACT inhaler TAKE 2 PUFFS BY MOUTH EVERY 6 HOURS AS NEEDED FOR WHEEZE OR SHORTNESS OF BREATH   aspirin EC 81 MG tablet Take 1 tablet (81  mg total) by mouth daily.   atorvastatin (LIPITOR) 80 MG tablet TAKE 1 TABLET BY MOUTH EVERY DAY   benzonatate (TESSALON) 100 MG capsule Take 1 capsule (100 mg total) by mouth 2 (two) times daily as needed for cough.   donepezil (ARICEPT) 5 MG tablet Take 5 mg by mouth at bedtime.   DULoxetine (CYMBALTA) 60 MG capsule TAKE 1 CAPSULE BY MOUTH EVERY DAY   fluticasone (FLONASE) 50 MCG/ACT nasal spray SPRAY 2 SPRAYS INTO EACH NOSTRIL EVERY DAY   ketoconazole (NIZORAL) 2 % shampoo Shampoo scalp 3 times a week, let sit 10 minutes and rinse out   loratadine (CLARITIN) 10 MG tablet Take 1 tablet (10 mg total) by mouth 2 (two) times daily.   losartan (COZAAR) 25 MG tablet Take 1 tablet (25 mg total) by mouth daily.   mometasone (ELOCON) 0.1 % lotion Apply topically as directed. Apply 1-2 gtts to aa itchy scalp qd up to 4 days a week   montelukast (SINGULAIR) 10 MG tablet Take 1 tablet (10 mg total) by mouth daily.   olopatadine (PATANOL) 0.1 % ophthalmic solution Place 1 drop into both eyes 2 (two) times daily.   silodosin (RAPAFLO) 8 MG CAPS capsule Take 1 capsule (8 mg total) by mouth daily with breakfast.   tadalafil (CIALIS) 20 MG tablet 0.5-1 tab p.o. 1 hour prior to intercourse   traZODone (DESYREL) 100 MG tablet Take 1 tablet (100 mg total) by mouth at bedtime.   Blood Glucose Monitoring Suppl (ACCU-CHEK AVIVA PLUS) w/Device KIT 1 kit by Does not apply route daily. (Patient not taking: Reported on 06/19/2021)   dicyclomine (BENTYL) 20 MG tablet Take 20  mg by mouth 4 (four) times daily. (Patient not taking: Reported on 06/19/2021)   glucose blood (ACCU-CHEK AVIVA PLUS) test strip 1 each by Other route 2 (two) times daily as needed for other. c (Patient not taking: Reported on 06/19/2021)   Lancets (ACCU-CHEK SOFT TOUCH) lancets 1 each by Other route 2 (two) times daily as needed for other. DMII (Patient not taking: Reported on 06/19/2021)   Vibegron (GEMTESA) 75 MG TABS Take 75 mg by mouth daily. (Patient  not taking: Reported on 06/19/2021)   No facility-administered encounter medications on file as of 06/19/2021.    Allergies (verified) Ibuprofen, Levofloxacin, Lisinopril, Metoprolol, Nsaids, Tolmetin, Tramadol hcl, and Tylenol [acetaminophen]   History: Past Medical History:  Diagnosis Date   Allergy    Anxiety    Atopy    Cataract of right eye    [place]   Chronic constipation    Elevated LFTs    Fatty liver    GERD (gastroesophageal reflux disease)    Hyperlipidemia    Hypertension    Hypoglycemia    Insomnia    Memory change    Overweight    Paresthesia of hand    Seizure ([place])    12/12, 09/27/14, 02/28/16   Tenosynovitis of finger    Tinea cruris    Vitamin D deficiency    Past Surgical History:  Procedure Laterality Date   COLONOSCOPY WITH PROPOFOL N/A 03/25/2017   Procedure: COLONOSCOPY WITH PROPOFOL;  Surgeon: Lin Landsman, MD;  Location: [facility];  Service: Endoscopy;  Laterality: N/A;   COLONOSCOPY WITH PROPOFOL N/A 01/16/2021   Procedure: COLONOSCOPY WITH PROPOFOL;  Surgeon: [name], MD;  Location: [facility] ENDOSCOPY;  Service: Gastroenterology;  Laterality: N/A;  USE [place]ESE VIDEO INTERPRETER   ESOPHAGOGASTRODUODENOSCOPY N/A 03/25/2017   Procedure: ESOPHAGOGASTRODUODENOSCOPY (EGD);  Surgeon: Lin Landsman, MD;  Location: [facility];  Service: Endoscopy;  Laterality: N/A;  Diabetic - oral meds   LASIK Bilateral    Family History  Problem Relation Age of Onset   Diabetes Mother    Heart disease Mother    Social History   Socioeconomic History   Marital status: Married    Spouse name: [name]   Number of children: 3   Years of education: Not on file   Highest education level: 12th grade  Occupational History   Occupation: Retired   Tobacco Use   Smoking status: Former    Packs/day: 1.00    Years: 15.00    Pack years: 15.00    Types: Cigarettes    Start date: 06/17/1984    Quit date: 11/28/1999    Years  since quitting: 21.5   Smokeless tobacco: Never  Vaping Use   Vaping Use: Never used  Substance and Sexual Activity   Alcohol use: Yes    Alcohol/week: 1.0 standard drink    Types: 1 Cans of beer per week    Comment: occassional beer   Drug use: No   Sexual activity: Yes    Partners: Female  Other Topics Concern   Not on file  Social History Narrative   Originally from [place]    Social Determinants of Health   Financial Resource Strain: Medium Risk   Difficulty of Paying Living Expenses: Somewhat hard  Food Insecurity: No Food Insecurity   Worried About Charity fundraiser in the Last Year: Never true   Arboriculturist in the Last Year: Never true  Transportation Needs: No Transportation Needs  Lack of Transportation (Medical): No   Lack of Transportation (Non-Medical): No  Physical Activity: Inactive   Days of Exercise per Week: 0 days   Minutes of Exercise per Session: 0 min  Stress: No Stress Concern Present   Feeling of Stress : Not at all  Social Connections: Moderately Integrated   Frequency of Communication with Friends and Family: More than three times a week   Frequency of Social Gatherings with Friends and Family: More than three times a week   Attends Religious Services: 1 to 4 times per year   Active Member of [employer] or Organizations: No   Attends Music therapist: Never   Marital Status: Married    Tobacco Counseling Counseling given: Not Answered   Clinical Intake:  Pre-visit preparation completed: Yes  Pain : No/denies pain     Nutritional Risks: None Diabetes: Yes CBG done?: No Did pt. bring in CBG monitor from home?: No  How often do you need to have someone help you when you read instructions, pamphlets, or other written materials from your doctor or pharmacy?: 1 - Never  Nutrition Risk Assessment:  Has the patient had any N/V/D within the last 2 months?  No  Does the patient have any non-healing wounds?  No  Has the  patient had any unintentional weight loss or weight gain?  No   Diabetes:  Is the patient diabetic?  Yes  If diabetic, was a CBG obtained today?  No  Did the patient bring in their glucometer from home?  No  How often do you monitor your CBG's? Pt does not actively check blood sugar; states he does not have a meter.   Financial Strains and Diabetes Management:  Are you having any financial strains with the device, your supplies or your medication? Yes .  Does the patient want to be seen by Chronic Care Management for management of their diabetes?  Yes  Would the patient like to be referred to a Nutritionist or for Diabetic Management?  No   Diabetic Exams:  Diabetic Eye Exam: Overdue for diabetic eye exam. Pt has been advised about the importance in completing this exam.   Diabetic Foot Exam: Completed 11/09/19. Pt has been advised about the importance in completing this exam. Pt is scheduled for diabetic foot exam on 12/05/21.    Interpreter Needed?: Yes Interpreter Agency: [facility] Interpreter Name: Tawny Hopping  Information entered by :: Clemetine Marker LPN   Activities of Daily Living In your present state of health, do you have any difficulty performing the following activities: 06/19/2021 06/06/2021  Hearing? Y N  Comment declines hearing aids -  Vision? Y N  Comment due for exam -  Difficulty concentrating or making decisions? Y N  Comment sees neurology -  Walking or climbing stairs? N N  Dressing or bathing? N N  Doing errands, shopping? N N  Preparing Food and eating ? N -  Using the Toilet? N -  In the past six months, have you accidently leaked urine? Y -  Do you have problems with loss of bowel control? N -  Managing your Medications? N -  Managing your Finances? N -  Housekeeping or managing your Housekeeping? N -  Some recent data might be hidden    Patient Care Team: [name], MD as PCP - General (Family Medicine) Vladimir Crofts, MD  as Consulting Physician (Neurology) Abbie Sons, MD (Urology) Kate Sable, MD as Consulting Physician (Cardiology) Vicente Males,  Bailey Mech, MD as Consulting Physician (Gastroenterology)  Indicate any recent Medical Services you may have received from other than [facility] providers in the past year (date may be approximate).     Assessment:   This is a routine wellness examination for David Skinner.  Hearing/Vision screen Hearing Screening - Comments:: Patient c/o mild hearing difficulty Vision Screening - Comments:: Due for eye exam; pt established with [facility]  Dietary issues and exercise activities discussed: Current Exercise Habits: The patient does not participate in regular exercise at present, Exercise limited by: None identified   Goals Addressed             This Visit's Progress    DIET - INCREASE WATER INTAKE   On track    Recommend drinking 6-8 glasses of water per day      Increase physical activity       Recommend increasing physical activity to at least 3 days per week       Depression Screen PHQ 2/9 Scores 06/19/2021 06/06/2021 03/07/2021 09/11/2020 05/18/2020 03/10/2020 11/09/2019  PHQ - 2 Score 0 0 0 0 0 1 0  PHQ- 9 Score 2 1 - 6 - 6 1    Fall Risk Fall Risk  06/19/2021 06/06/2021 03/07/2021 09/11/2020 05/18/2020  Falls in the past year? 0 0 0 0 0  Number falls in past yr: 0 0 - 0 0  Injury with Fall? 0 0 - 0 0  Risk for fall due to : No Fall Risks No Fall Risks - - History of fall(s)  Follow up Falls prevention discussed Falls prevention discussed Falls prevention discussed - Falls prevention discussed    FALL RISK PREVENTION PERTAINING TO THE HOME:  Any stairs in or around the home? Yes  If so, are there any without handrails? No  Home free of loose throw rugs in walkways, pet beds, electrical cords, etc? Yes  Adequate lighting in your home to reduce risk of falls? Yes   ASSISTIVE DEVICES UTILIZED TO PREVENT FALLS:  Life alert? No  Use of a cane, walker  or w/c? No  Grab bars in the bathroom? No  Shower chair or bench in shower? Yes  Elevated toilet seat or a handicapped toilet? No   TIMED UP AND GO:  Was the test performed? No . Telephonic visit.   Cognitive Function: Cognitive status assessed by direct observation. Patient has current diagnosis of cognitive impairment. Patient is followed by neurology for ongoing assessment.        6CIT Screen 05/18/2020  What Year? 0 points  What month? 0 points  What time? 0 points  Count back from 20 0 points  Months in reverse 0 points  Repeat phrase 6 points  Total Score 6    Immunizations Immunization History  Administered Date(s) Administered   Fluad Quad(high Dose 65+) 02/09/2019, 03/10/2020, 06/19/2021   Hepatitis A 02/25/2013, 11/12/2013   Hepatitis A, Adult 03/24/2017   Hepatitis B 02/25/2013, 11/12/2013   Hepatitis B, adult 11/28/2014, 03/24/2017   Influenza, High Dose Seasonal PF 04/08/2018   Influenza,inj,Quad PF,6+ Mos 03/08/2015, 05/28/2016, 04/10/2017   Influenza-Unspecified 02/25/2013, 03/01/2014   Moderna Sars-Covid-2 Vaccination 07/30/2019, 08/27/2019, 04/19/2020, 01/03/2021   Pneumococcal Conjugate-13 04/08/2018   Pneumococcal Polysaccharide-23 03/21/2015, 09/11/2020   Tdap 12/02/2012   Zoster, Live 03/08/2015    TDAP status: Up to date  Flu Vaccine status: Up to date  Pneumococcal vaccine status: Up to date  Covid-19 vaccine status: Completed vaccines  Qualifies for Shingles Vaccine? Yes   Zostavax  completed Yes   Shingrix Completed?: No.    Education has been provided regarding the importance of this vaccine. Patient has been advised to call insurance company to determine out of pocket expense if they have not yet received this vaccine. Advised may also receive vaccine at local pharmacy or Health Dept. Verbalized acceptance and understanding.  Screening Tests Health Maintenance  Topic Date Due   Zoster Vaccines- Shingrix (1 of 2) Never done    OPHTHALMOLOGY EXAM  06/16/2020   FOOT EXAM  11/08/2020   COVID-19 Vaccine (5 - Booster for Moderna series) 02/28/2021   HEMOGLOBIN A1C  12/05/2021   TETANUS/TDAP  12/03/2022   COLONOSCOPY (Pts 45-67yr Insurance coverage will need to be confirmed)  01/17/2028   Pneumonia Vaccine 71 Years old  Completed   INFLUENZA VACCINE  Completed   Hepatitis C Screening  Completed   HPV VACCINES  Aged Out    Health Maintenance  Health Maintenance Due  Topic Date Due   Zoster Vaccines- Shingrix (1 of 2) Never done   OPHTHALMOLOGY EXAM  06/16/2020   FOOT EXAM  11/08/2020   COVID-19 Vaccine (5 - Booster for Moderna series) 02/28/2021    Colorectal cancer screening: Type of screening: Colonoscopy. Completed 01/16/21. Repeat every 7 years  Lung Cancer Screening: (Low Dose CT Chest recommended if Age 69-80years, 30 pack-year currently smoking OR have quit w/in 15years.) does not qualify.   Additional Screening:  Hepatitis C Screening: does qualify; Completed 03/17/17  Vision Screening: Recommended annual ophthalmology exams for early detection of glaucoma and other disorders of the eye. Is the patient up to date with their annual eye exam?  No  Who is the provider or what [facility] the [facility]   Dental Screening: Recommended annual dental exams for proper oral hygiene  Community Resource Referral / Chronic Care Management: CRR required this visit?  No   CCM required this visit?  Yes      Plan:     I have personally reviewed and noted the following in the patients chart:   Medical and social history Use of alcohol, tobacco or illicit drugs  Current medications and supplements including opioid prescriptions. Patient is not currently taking opioid prescriptions. Functional ability and status Nutritional status Physical activity Advanced directives List of other physicians Hospitalizations, surgeries, and ER  visits in previous 12 months Vitals Screenings to include cognitive, depression, and falls Referrals and appointments  In addition, I have reviewed and discussed with patient certain preventive protocols, quality metrics, and best practice recommendations. A written personalized care plan for preventive services as well as general preventive health recommendations were provided to patient.     KClemetine Marker LPN   05/25/4764  Nurse Notes: pt complains of scalp itching; scheduled to see dermatology 07/11/21.  Pt also c/o left hand shaking for the past month; advised to discuss with PCP or schedule follow up with neurology.  Pt states gemtesa was helpful for urinary frequency and bladder leakage but unable to afford; [facility]. Pt also advised to contact urology to schedule appt for earlier than May 2023.

## 2021-06-19 NOTE — Progress Notes (Signed)
Patient tolerated vaccination well with no adverse reactions immediatly noticed.

## 2021-06-21 ENCOUNTER — Telehealth: Payer: Self-pay | Admitting: *Deleted

## 2021-06-21 NOTE — Chronic Care Management (AMB) (Signed)
Chronic Care Management   Note  06/21/2021 Name: Zaydan Papesh MRN: [id number] DOB: 06-Oct-1950  Dorthula Matas Mcinerny is a 71 y.o. year old male who is a primary care patient of [name], MD. I reached out to Lou Cal by phone today in response to a referral sent by Mr. Fenris Cauble Vogelgesang's PCP.  Mr. Cassada was given information about Chronic Care Management services today including:  CCM service includes personalized support from designated clinical staff supervised by his physician, including individualized plan of care and coordination with other care providers 24/7 contact phone numbers for assistance for urgent and routine care needs. Service will only be billed when office clinical staff spend 20 minutes or more in a month to coordinate care. Only one practitioner may furnish and bill the service in a calendar month. The patient may stop CCM services at any time (effective at the end of the month) by phone call to the office staff. The patient is responsible for co-pay (up to 20% after annual deductible is met) if co-pay is required by the individual health plan.   Patient agreed to services and verbal consent obtained.   Follow up plan: Telephone appointment with care management team member scheduled for: 08/08/2021  Julian Hy, [place] Management  Direct Dial: [phone]

## 2021-06-25 IMAGING — CT CT HEART MORP W/ CTA COR W/ SCORE W/ CA W/CM &/OR W/O CM
1 of 14 series · 3 of 20 positions shown, 4 images · non-contrast
Comparison: None.

Addendum:
CLINICAL DATA: Dyspnea on exertion, former smoker

EXAM:
Cardiac/Coronary  CTA
TECHNIQUE: The patient was scanned on a Siemens Somatoform go.Top scanner.

[Series 44: ms multiphase cta coronary 0.60 · axial · 0.33mm/px · z∈[-1156,-1092]mm · 3 of 2889 slices shown, 4 images]
[im 723/2889  vessel]
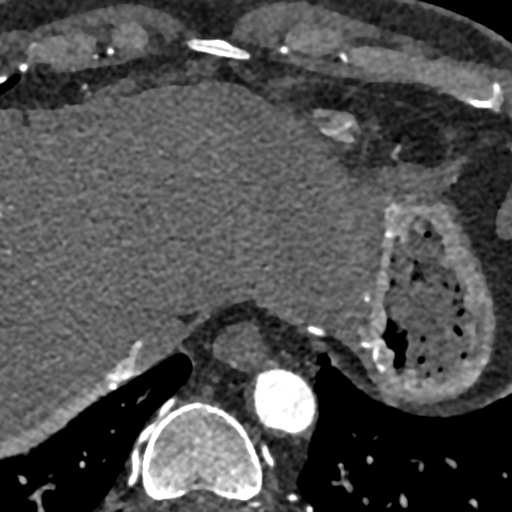
[im 723/2889  lung]
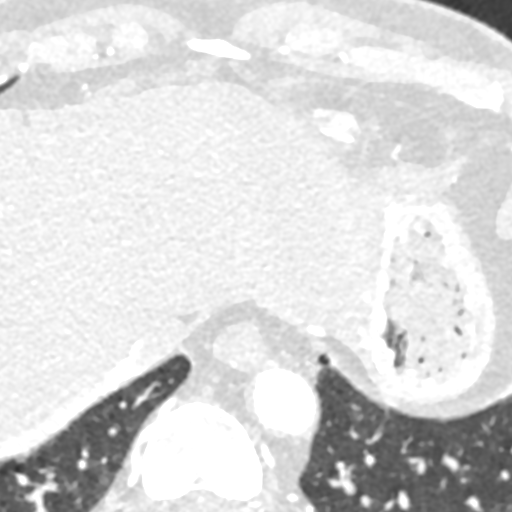
[im 1445/2889  vessel]
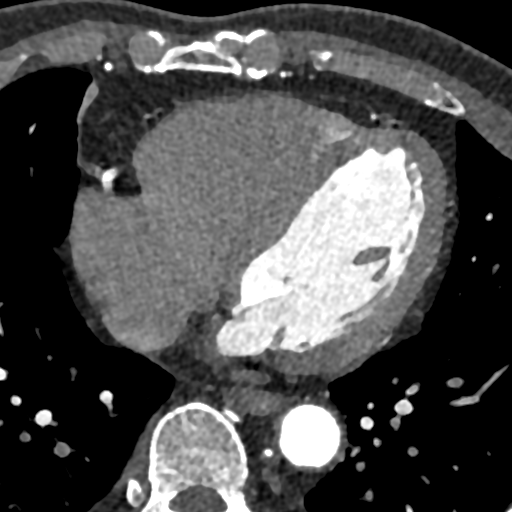
[im 2167/2889  vessel]
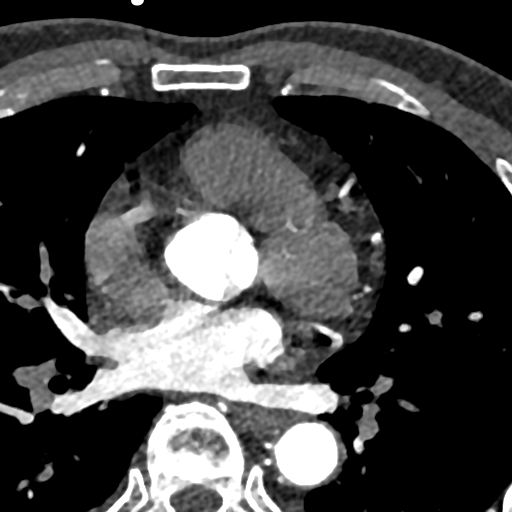

[3 of 20 positions shown; findings below may reference images not displayed]

FINDINGS: A retrospective scan was triggered in the descending thoracic aorta.
Axial non-contrast 3 mm slices were carried out through the heart.
The data set was analyzed on a dedicated work station and scored
using the Agatson method. Gantry rotation speed was 330 msecs and
collimation was .6 mm. 100mg of metoprolol and 0.4 mg of sl NTG was
given. The 3D data set was reconstructed in 5% intervals of the
60-95 % of the R-R cycle. Diastolic phases were analyzed on a
dedicated work station using MPR, MIP and VRT modes. The patient
received 75 cc of contrast.

Aorta: Normal size. Very minimal root calcifications. No dissection.

Aortic Valve:  Trileaflet.  No calcifications.

Coronary Arteries:  Normal coronary origin.  Right dominance.

RCA is a large dominant artery that gives rise to PDA and PLA. There
is no plaque.

Left main is a large artery that gives rise to LAD, Ramus and LCX
arteries. The ramus has no disease.

LAD is a large vessel that has mild calcified plaque in the prox-mid
segment causing mild (25-49%) stenosis.

LCX is a non-dominant artery that gives rise to one obtuse marginal
branch. There is no plaque.

Other findings:

Normal pulmonary vein drainage into the left atrium.

Normal left atrial appendage without a thrombus.

Normal size of the pulmonary artery.
IMPRESSION: 1. Coronary calcium score of 85.5. This was 67th percentile for age
and sex matched control.

2. Normal coronary origin with right dominance.

3. Calcified plaque in the prox-mid LAD causing mild stenosis.

4. CAD-RADS 2. Mild non-obstructive CAD (25-49%). Consider
non-atherosclerotic causes of chest pain. Consider preventive
therapy and risk factor modification.

EXAM:
OVER-READ INTERPRETATION  CT CHEST

The following report is an over-read performed by radiologist Dr.
over-read does not include interpretation of cardiac or coronary
anatomy or pathology. The coronary calcium score and cardiac CTA
interpretation by the cardiologist is attached.
FINDINGS: Within the visualized portions of the thorax there are no suspicious
appearing pulmonary nodules or masses, there is no acute
consolidative airspace disease, no pleural effusions, no
pneumothorax and no lymphadenopathy. Visualized portions of the
upper abdomen are unremarkable. There are no aggressive appearing
lytic or blastic lesions noted in the visualized portions of the
skeleton.
IMPRESSION: No significant incidental noncardiac findings are noted.

*** End of Addendum ***
FINDINGS: A retrospective scan was triggered in the descending thoracic aorta.
Axial non-contrast 3 mm slices were carried out through the heart.
The data set was analyzed on a dedicated work station and scored
using the Agatson method. Gantry rotation speed was 330 msecs and
collimation was .6 mm. 100mg of metoprolol and 0.4 mg of sl NTG was
given. The 3D data set was reconstructed in 5% intervals of the
60-95 % of the R-R cycle. Diastolic phases were analyzed on a
dedicated work station using MPR, MIP and VRT modes. The patient
received 75 cc of contrast.

Aorta: Normal size. Very minimal root calcifications. No dissection.

Aortic Valve:  Trileaflet.  No calcifications.

Coronary Arteries:  Normal coronary origin.  Right dominance.

RCA is a large dominant artery that gives rise to PDA and PLA. There
is no plaque.

Left main is a large artery that gives rise to LAD, Ramus and LCX
arteries. The ramus has no disease.

LAD is a large vessel that has mild calcified plaque in the prox-mid
segment causing mild (25-49%) stenosis.

LCX is a non-dominant artery that gives rise to one obtuse marginal
branch. There is no plaque.

Other findings:

Normal pulmonary vein drainage into the left atrium.

Normal left atrial appendage without a thrombus.

Normal size of the pulmonary artery.
IMPRESSION: 1. Coronary calcium score of 85.5. This was 67th percentile for age
and sex matched control.

2. Normal coronary origin with right dominance.

3. Calcified plaque in the prox-mid LAD causing mild stenosis.

4. CAD-RADS 2. Mild non-obstructive CAD (25-49%). Consider
non-atherosclerotic causes of chest pain. Consider preventive
therapy and risk factor modification.

## 2021-07-02 DIAGNOSIS — I2510 Atherosclerotic heart disease of native coronary artery without angina pectoris: Secondary | ICD-10-CM | POA: Insufficient documentation

## 2021-07-02 DIAGNOSIS — Z7689 Persons encountering health services in other specified circumstances: Secondary | ICD-10-CM | POA: Diagnosis not present

## 2021-07-02 DIAGNOSIS — I10 Essential (primary) hypertension: Secondary | ICD-10-CM | POA: Diagnosis not present

## 2021-07-02 DIAGNOSIS — E782 Mixed hyperlipidemia: Secondary | ICD-10-CM | POA: Diagnosis not present

## 2021-07-02 DIAGNOSIS — R0602 Shortness of breath: Secondary | ICD-10-CM | POA: Diagnosis not present

## 2021-07-02 DIAGNOSIS — I208 Other forms of angina pectoris: Secondary | ICD-10-CM | POA: Diagnosis not present

## 2021-07-11 ENCOUNTER — Other Ambulatory Visit: Payer: Self-pay

## 2021-07-11 ENCOUNTER — Ambulatory Visit (INDEPENDENT_AMBULATORY_CARE_PROVIDER_SITE_OTHER): Payer: Medicare Other | Admitting: Dermatology

## 2021-07-11 DIAGNOSIS — L739 Follicular disorder, unspecified: Secondary | ICD-10-CM | POA: Diagnosis not present

## 2021-07-11 DIAGNOSIS — L219 Seborrheic dermatitis, unspecified: Secondary | ICD-10-CM | POA: Diagnosis not present

## 2021-07-11 MED ORDER — DOXYCYCLINE HYCLATE 50 MG PO CAPS: ORAL_CAPSULE | ORAL | 2 refills | Status: DC

## 2021-07-11 MED ORDER — KETOCONAZOLE 2 % EX SHAM: MEDICATED_SHAMPOO | CUTANEOUS | 6 refills | Status: DC

## 2021-07-11 MED ORDER — MOMETASONE FUROATE 0.1 % EX SOLN: CUTANEOUS | 2 refills | Status: DC

## 2021-07-11 NOTE — Patient Instructions (Signed)

## 2021-07-11 NOTE — Progress Notes (Signed)
° °  Follow-Up Visit   Subjective  David Skinner is a 71 y.o. male who presents for the following: Itchy bumps in the scalp (Patient has a hx of folliculitis and seborrheic dermatitis - he used Doxycycline 50mg  po QD, Ketoconazole 2% shampoo, and Mometasone solution in the past but still c/o itching. He has been out of the medications and is not currently using anything.).  The following portions of the chart were reviewed this encounter and updated as appropriate:   Tobacco   Allergies   Meds   Problems   Med Hx   Surg Hx   Fam Hx      Review of Systems:  No other skin or systemic complaints except as noted in HPI or Assessment and Plan.  Objective  Well appearing patient in no apparent distress; mood and affect are within normal limits.  A focused examination was performed including the scalp. Relevant physical exam findings are noted in the Assessment and Plan.  Scalp Papules and pustules of the scalp.        Scalp Erythematous, scaly patches involving the ankle and distal lower leg with associated lower leg edema.    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  Doxycycline should be taken with food to prevent nausea. Do not lay down for 30 minutes after taking. Be cautious with sun exposure and use good sun protection while on this medication. Pregnant women should not take this medication.   doxycycline (VIBRAMYCIN) 50 MG capsule - Scalp Take one cap po QD with food. Related Medications ketoconazole (NIZORAL) 2 % shampoo Shampoo scalp 3 times a week, let sit 10 minutes and rinse out  Seborrheic dermatitis With recent flare Scalp Seborrheic Dermatitis  -  is a chronic persistent rash characterized by pinkness and scaling most commonly of the mid face but also can occur on the scalp (dandruff), ears; mid chest, mid back and groin.  It tends to be exacerbated by stress  and cooler weather.  People who have neurologic disease may experience new onset or exacerbation of existing seborrheic dermatitis.  The condition is not curable but treatable and can be controlled.  Restart Mometasone solution to aa's scalp QD up to 5d/wk.   Topical steroids (such as triamcinolone, fluocinolone, fluocinonide, mometasone, clobetasol, halobetasol, betamethasone, hydrocortisone) can cause thinning and lightening of the skin if they are used for too long in the same area. Your physician has selected the right strength medicine for your problem and area affected on the body. Please use your medication only as directed by your physician to prevent side effects.   Restart Ketoconazole 2% shampoo let sit 5-10 minutes then wash out. Use three days per week.  Related Medications mometasone (ELOCON) 0.1 % lotion Apply topically as directed. Apply 1-2 gtts to aa itchy scalp qd up to 5 days a week  Return in about 2 months (around 09/08/2021) for scalp follow up .  Luther Redo, CMA, am acting as scribe for Sarina Ser, MD . Documentation: I have reviewed the above documentation for accuracy and completeness, and I agree with the above.  Sarina Ser, MD

## 2021-07-15 ENCOUNTER — Encounter: Payer: Self-pay | Admitting: Dermatology

## 2021-07-24 DIAGNOSIS — I2510 Atherosclerotic heart disease of native coronary artery without angina pectoris: Secondary | ICD-10-CM | POA: Diagnosis not present

## 2021-07-24 DIAGNOSIS — I208 Other forms of angina pectoris: Secondary | ICD-10-CM | POA: Diagnosis not present

## 2021-07-24 DIAGNOSIS — R0602 Shortness of breath: Secondary | ICD-10-CM | POA: Diagnosis not present

## 2021-07-31 DIAGNOSIS — R0602 Shortness of breath: Secondary | ICD-10-CM | POA: Diagnosis not present

## 2021-07-31 DIAGNOSIS — E782 Mixed hyperlipidemia: Secondary | ICD-10-CM | POA: Diagnosis not present

## 2021-07-31 DIAGNOSIS — I10 Essential (primary) hypertension: Secondary | ICD-10-CM | POA: Diagnosis not present

## 2021-07-31 DIAGNOSIS — I2510 Atherosclerotic heart disease of native coronary artery without angina pectoris: Secondary | ICD-10-CM | POA: Diagnosis not present

## 2021-08-06 ENCOUNTER — Telehealth: Payer: Self-pay

## 2021-08-06 NOTE — Progress Notes (Signed)
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    Recent office visits:  06/20/2031 Clemetine Marker, LPN (PCP Clinical Support) for Medicare Wellness Exam- No medication changes noted, no orders placed, no follow-up noted,   06/06/2021 [name], MD (PCP Office Visit)  for Follow-up- Stopped: Doxycycline Monohydrate 50 mg,Fluconazole 150 mg, Changed: Duloxetine HCl 60 mg 1 capsule daily, Dermatology Referral Ordered, patient instructed to follow-up in 6 months  03/07/2021 [name], MD (PCP Office Visit) for Follow-up- Started: Fluconazole 150 mg every other day, lab orders placed, patient instructed to follow-up in 3 months.  Recent consult visits:  07/11/2021 Sarina Ser, MD (Dermatology) for Folliculitis-  Started: Doxycycline Hyclate 50 mg 1 capsule daily, Changed: Mometasone Furoate 0.1% Topical apply 1-2 gtts daily up to 5 days a week  07/02/2021 Flossie Dibble, MD (Cardiology) - I do not have the appropriate security to download this document.  06/27/2021 Heang Leeanne Deed, MD (Neurology) for Memory Loss- I do not have the appropriate security to download this document.  Hospital visits:  None in previous 6 months  Medications: Outpatient Encounter Medications as of 08/06/2021  Medication Sig Note   albuterol (VENTOLIN HFA) 108 (90 Base) MCG/ACT inhaler TAKE 2 PUFFS BY MOUTH EVERY 6 HOURS AS  NEEDED FOR WHEEZE OR SHORTNESS OF BREATH    aspirin EC 81 MG tablet Take 1 tablet (81 mg total) by mouth daily.    atorvastatin (LIPITOR) 80 MG tablet TAKE 1 TABLET BY MOUTH EVERY DAY    benzonatate (TESSALON) 100 MG capsule Take 1 capsule (100 mg total) by mouth 2 (two) times daily as needed for cough.    Blood Glucose Monitoring Suppl (ACCU-CHEK AVIVA PLUS) w/Device KIT 1 kit by Does not apply route daily. (Patient not taking: Reported on 06/19/2021)    dicyclomine (BENTYL) 20 MG tablet Take 20 mg by mouth 4 (four) times daily. (Patient not taking: Reported on 06/19/2021)    donepezil (ARICEPT) 5 MG tablet Take 5 mg by mouth at bedtime.    doxycycline (VIBRAMYCIN) 50 MG capsule Take one cap po QD with food.    DULoxetine (CYMBALTA) 60 MG capsule TAKE 1 CAPSULE BY MOUTH EVERY DAY    fluticasone (FLONASE) 50 MCG/ACT nasal spray SPRAY 2 SPRAYS INTO EACH NOSTRIL EVERY DAY    glucose blood (ACCU-CHEK AVIVA PLUS) test strip 1 each by Other route 2 (two) times daily as needed for other. c (Patient not taking: Reported on 06/19/2021)    ketoconazole (NIZORAL) 2 % shampoo Shampoo scalp 3 times a week, let sit 10 minutes and rinse out    Lancets (ACCU-CHEK SOFT TOUCH) lancets 1 each by Other route 2 (two) times daily as needed for other. DMII (Patient not taking: Reported on 06/19/2021)    loratadine (CLARITIN) 10 MG tablet Take 1 tablet (10 mg total) by mouth 2 (two) times daily.    losartan (COZAAR) 25 MG tablet Take 1 tablet (25 mg total) by mouth daily.    mometasone (ELOCON)  0.1 % lotion Apply topically as directed. Apply 1-2 gtts to aa itchy scalp qd up to 5 days a week    montelukast (SINGULAIR) 10 MG tablet Take 1 tablet (10 mg total) by mouth daily.    olopatadine (PATANOL) 0.1 % ophthalmic solution Place 1 drop into both eyes 2 (two) times daily.    silodosin (RAPAFLO) 8 MG CAPS capsule Take 1 capsule (8 mg total) by mouth daily with breakfast.    tadalafil (CIALIS) 20 MG tablet 0.5-1 tab p.o. 1 hour  prior to intercourse    traZODone (DESYREL) 100 MG tablet Take 1 tablet (100 mg total) by mouth at bedtime.    Vibegron (GEMTESA) 75 MG TABS Take 75 mg by mouth daily. (Patient not taking: Reported on 06/19/2021) 06/19/2021: Pt states not taking due to cost    No facility-administered encounter medications on file as of 08/06/2021.   Care Gaps: Zoster Vaccines Diabetic Eye Exam Diabetic Foot Exam  Star Rating Drugs: Losartan 25 mg last filled on 05/27/2021 for a 90-Day supply with [facility] Atorvastatin 80 mg last filled on 05/27/2021 for a 90-Day supply with [facility]  Have you seen any other providers since your last visit? Per spouse the patient has seen a Paediatric nurse, Neurology, and Cardiology.  Any changes in your medications or health?  Per Spouse the Dermatologist started the patient on new medications but his health as far as she knows is still the same since seeing PCP nothing new  Any side effects from any medications? No  Do you have an symptoms or problems not managed by your medications?  Per the patient's spouse the medication he is taking for his itchy skin and scalp is not working. She stated that per the patient he is still having skin issues although he is taking medication as prescribed   Any concerns about your health right now?  Per spouse the patient does not have any concerns other than his skin issues  Has your provider asked that you check blood pressure, blood sugar, or follow special diet at home?  Per patient's spouse the patient does not have a blood pressure machine at home so he does not check his blood pressure. The patient is not taking any diabetic medications at this time and he does not check his blood sugar numbers. The patient eats a normal diet nothing special  Do you get any type of exercise on a regular basis?   Per the patient's spouse the patient does not exercise  Can you think of a goal you would like to reach for your health? Can't  think of anything at this time  Do you have any problems getting your medications?  No issues with the current pharmacy just issues with affording his [name]   Patient's spouse instructed that this would be a telephone appointment. Patients spouse instructed to have his medications available during the appointment. The patient's spouse will be the one you can speak with as she interprets for the patient.    Lynann Bologna, CPA/CMA Clinical Pharmacist Assistant Phone: [phone]

## 2021-08-08 ENCOUNTER — Ambulatory Visit (INDEPENDENT_AMBULATORY_CARE_PROVIDER_SITE_OTHER): Payer: Medicare Other

## 2021-08-08 DIAGNOSIS — E1140 Type 2 diabetes mellitus with diabetic neuropathy, unspecified: Secondary | ICD-10-CM

## 2021-08-08 DIAGNOSIS — K219 Gastro-esophageal reflux disease without esophagitis: Secondary | ICD-10-CM

## 2021-08-08 DIAGNOSIS — E785 Hyperlipidemia, unspecified: Secondary | ICD-10-CM

## 2021-08-08 DIAGNOSIS — J302 Other seasonal allergic rhinitis: Secondary | ICD-10-CM

## 2021-08-08 DIAGNOSIS — I10 Essential (primary) hypertension: Secondary | ICD-10-CM

## 2021-08-08 DIAGNOSIS — F5101 Primary insomnia: Secondary | ICD-10-CM

## 2021-08-08 DIAGNOSIS — F325 Major depressive disorder, single episode, in full remission: Secondary | ICD-10-CM

## 2021-08-08 DIAGNOSIS — F418 Other specified anxiety disorders: Secondary | ICD-10-CM

## 2021-08-08 DIAGNOSIS — N521 Erectile dysfunction due to diseases classified elsewhere: Secondary | ICD-10-CM

## 2021-08-08 MED ORDER — DULOXETINE HCL 60 MG PO CPEP: ORAL_CAPSULE | ORAL | 1 refills | Status: DC

## 2021-08-08 NOTE — Progress Notes (Signed)
Chronic Care Management Pharmacy Note  08/16/2021 Name:  David Skinner MRN:  [id number] DOB:  1950-09-22  Summary: Patient presents for initial CCM consult. Today's visit utilized [employer] interpreter, Tory.   Recommendations/Changes made from today's visit: Start PAP for Gemtesa   Continue current medications  Plan: No further follow up required: Patient to follow-up with clinical pharmacist team as needed   Subjective: David Skinner is an 71 y.o. year old male who is a primary patient of [name], MD.  The CCM team was consulted for assistance with disease management and care coordination needs.    Engaged with patient by telephone for initial visit in response to provider referral for pharmacy case management and/or care coordination services.   Consent to Services:  The patient was given the following information about Chronic Care Management services today, agreed to services, and gave verbal consent: 1. CCM service includes personalized support from designated clinical staff supervised by the primary care provider, including individualized plan of care and coordination with other care providers 2. 24/7 contact phone numbers for assistance for urgent and routine care needs. 3. Service will only be billed when office clinical staff spend 20 minutes or more in a month to coordinate care. 4. Only one practitioner may furnish and bill the service in a calendar month. 5.The patient may stop CCM services at any time (effective at the end of the month) by phone call to the office staff. 6. The patient will be responsible for cost sharing (co-pay) of up to 20% of the service fee (after annual deductible is met). Patient agreed to services and consent obtained.  Patient Care Team: [name], MD as PCP - General (Family Medicine) Vladimir Crofts, MD as Consulting Physician (Neurology) Abbie Sons, MD (Urology) Kate Sable, MD as Consulting Physician  (Cardiology) [name], MD as Consulting Physician (Gastroenterology) Germaine Pomfret, [facility] (Pharmacist)  Recent office visits: 06/20/2031 Clemetine Marker, LPN (PCP Clinical Support) for Medicare Wellness Exam- No medication changes noted, no orders placed, no follow-up noted,    06/06/2021 [name], MD (PCP Office Visit)  for Follow-up- Stopped: Doxycycline Monohydrate 50 mg,Fluconazole 150 mg, Changed: Duloxetine HCl 60 mg 1 capsule daily, Dermatology Referral Ordered, patient instructed to follow-up in 6 months   03/07/2021 [name], MD (PCP Office Visit) for Follow-up- Started: Fluconazole 150 mg every other day, lab orders placed, patient instructed to follow-up in 3 months.  Recent consult visits: 07/11/2021 Sarina Ser, MD (Dermatology) for Folliculitis-  Started: Doxycycline Hyclate 50 mg 1 capsule daily, Changed: Mometasone Furoate 0.1% Topical apply 1-2 gtts daily up to 5 days a week   07/02/2021 Flossie Dibble, MD (Cardiology)    06/27/2021 Heang Leeanne Deed, MD (Neurology) for Memory Loss  Hospital visits: None in previous 6 months   Objective:  Lab Results  Component Value Date   CREATININE 0.92 06/06/2021   BUN 12 06/06/2021   EGFR 91 06/06/2021   GFRNONAA 89 03/10/2020   GFRAA 103 03/10/2020   NA 141 06/06/2021   K 4.2 06/06/2021   CALCIUM 9.7 06/06/2021   CO2 31 06/06/2021   GLUCOSE 99 06/06/2021    Lab Results  Component Value Date/Time   HGBA1C 6.3 (H) 06/06/2021 10:46 AM   HGBA1C 6.4 (A) 09/11/2020 01:22 PM   HGBA1C 6.0 (A) 03/10/2020 11:40 AM   HGBA1C 6.3 02/09/2019 10:32 AM   HGBA1C 6.3 11/03/2018 10:52 AM   HGBA1C 5.8 01/05/2018 10:15 AM   MICROALBUR 1.3  06/09/2019 12:00 AM   MICROALBUR 1.5 07/30/2018 09:30 AM   MICROALBUR 20 01/05/2018 10:16 AM   MICROALBUR 20 04/10/2017 10:18 AM    Last diabetic Eye exam:  Lab Results  Component Value Date/Time   HMDIABEYEEXA No Retinopathy 06/17/2019 12:00 AM    Last diabetic  Foot exam: No results found for: HMDIABFOOTEX   Lab Results  Component Value Date   CHOL 157 06/06/2021   HDL 60 06/06/2021   LDLCALC 80 06/06/2021   TRIG 86 06/06/2021   CHOLHDL 2.6 06/06/2021    Hepatic Function Latest Ref Rng & Units 06/06/2021 03/10/2020 02/09/2019  Total Protein 6.1 - 8.1 g/dL 7.2 7.7 7.2  Albumin 3.6 - 5.1 g/dL - - -  AST 10 - 35 U/L _0 ALT 9 - 46 U/L 36 55(H) 47(H)  Alk Phosphatase 40 - 115 U/L - - -  Total Bilirubin 0.2 - 1.2 mg/dL 0.8 1.2 0.7  Bilirubin, Direct <=0.2 mg/dL - - -    Lab Results  Component Value Date/Time   TSH 0.45 03/03/2017 01:56 PM   TSH 1.100 11/08/2015 10:48 AM    CBC Latest Ref Rng & Units 06/06/2021 03/10/2020 02/09/2019  WBC 3.8 - 10.8 Thousand/uL 7.2 5.7 5.7  Hemoglobin 13.2 - 17.1 g/dL 16.2 17.3(H) 15.8  Hematocrit 38.5 - 50.0 % 47.9 52.1(H) 47.9  Platelets 140 - 400 Thousand/uL 267 258 252    Lab Results  Component Value Date/Time   VD25OH 131 (H) 06/06/2021 10:46 AM   VD25OH 87 03/10/2020 12:12 PM    Clinical ASCVD: No  The 10-year ASCVD risk score (Arnett DK, et al., 2019) is: 29.5%   Values used to calculate the score:     Age: 43 years     Sex: Male     Is Non-Hispanic African American: No     Diabetic: Yes     Tobacco smoker: No     Systolic Blood Pressure: 034 mmHg     Is BP treated: Yes     HDL Cholesterol: 60 mg/dL     Total Cholesterol: 157 mg/dL    Depression screen [facility] 2/9 06/19/2021 06/06/2021 03/07/2021  Decreased Interest 0 0 0  Down, Depressed, Hopeless 0 0 0  PHQ - 2 Score 0 0 0  Altered sleeping 0 0 -  Tired, decreased energy 0 0 -  Change in appetite 1 1 -  Feeling bad or failure about yourself  0 0 -  Trouble concentrating 1 0 -  Moving slowly or fidgety/restless 0 0 -  Suicidal thoughts 0 0 -  PHQ-9 Score 2 1 -  Difficult doing work/chores - - -  Some recent data might be hidden    Social History   Tobacco Use  Smoking Status Former   Packs/day: 1.00   Years: 15.00   Pack  years: 15.00   Types: Cigarettes   Start date: 06/17/1984   Quit date: 11/28/1999   Years since quitting: 21.7  Smokeless Tobacco Never   BP Readings from Last 3 Encounters:  06/06/21 112/70  01/16/21 (!) 143/82  01/03/21 115/76   Pulse Readings from Last 3 Encounters:  06/06/21 79  01/16/21 64  01/03/21 71   Wt Readings from Last 3 Encounters:  06/06/21 154 lb (69.9 kg)  03/07/21 150 lb (68 kg)  01/16/21 150 lb (68 kg)   BMI Readings from Last 3 Encounters:  06/06/21 25.63 kg/m  03/07/21 24.96 kg/m  01/16/21 24.96 kg/m    Assessment/Interventions: Review of  patient past medical history, allergies, medications, health status, including review of consultants reports, laboratory and other test data, was performed as part of comprehensive evaluation and provision of chronic care management services.   SDOH:  (Social Determinants of Health) assessments and interventions performed: Yes SDOH Interventions    Flowsheet Row Most Recent Value  SDOH Interventions   Financial Strain Interventions Intervention Not Indicated      SDOH Screenings   Alcohol Screen: Low Risk    Last Alcohol Screening Score (AUDIT): 1  Depression (PHQ2-9): Low Risk    PHQ-2 Score: 2  Financial Resource Strain: Low Risk    Difficulty of Paying Living Expenses: Not hard at all  Food Insecurity: No Food Insecurity   Worried About Charity fundraiser in the Last Year: Never true   Ran Out of Food in the Last Year: Never true  Housing: Low Risk    Last Housing Risk Score: 0  Physical Activity: Inactive   Days of Exercise per Week: 0 days   Minutes of Exercise per Session: 0 min  Social Connections: Moderately Integrated   Frequency of Communication with Friends and Family: More than three times a week   Frequency of Social Gatherings with Friends and Family: More than three times a week   Attends Religious Services: 1 to 4 times per year   Active Member of [employer] or Organizations: No   Attends  Music therapist: Never   Marital Status: Married  Stress: No Stress Concern Present   Feeling of Stress : Not at all  Tobacco Use: Medium Risk   Smoking Tobacco Use: Former   Smokeless Tobacco Use: Never   Passive Exposure: Not on Pensions consultant Needs: No Transportation Needs   Lack of Transportation (Medical): No   Lack of Transportation (Non-Medical): No    CCM Care Plan  Allergies  Allergen Reactions   Ibuprofen Itching   Levofloxacin    Lisinopril Cough   Metoprolol    Nsaids Itching   Tolmetin Itching   Tramadol Hcl Itching   Tylenol [Acetaminophen] Itching    Medications Reviewed Today     Reviewed by Germaine Pomfret, [facility] (Pharmacist) on 08/16/21 at 1634  Med List Status: <None>   Medication Order Taking? Sig Documenting Provider Last Dose Status Informant  albuterol (VENTOLIN HFA) 108 (90 Base) MCG/ACT inhaler [id number] Yes TAKE 2 PUFFS BY MOUTH EVERY 6 HOURS AS NEEDED FOR WHEEZE OR SHORTNESS OF Cherlynn June, Drue Stager, MD Taking Active   aspirin EC 81 MG tablet [id number] Yes Take 1 tablet (81 mg total) by mouth daily. [name], MD Taking Active   atorvastatin (LIPITOR) 80 MG tablet [id number] Yes TAKE 1 TABLET BY MOUTH EVERY DAY Sowles, Drue Stager, MD Taking Active   carboxymethylcellulose (REFRESH PLUS) 0.5 % SOLN [id number] Yes Place 1 drop into both eyes 3 (three) times daily as needed. [provider] Taking Active   donepezil (ARICEPT) 10 MG tablet [id number] Yes Take 10 mg by mouth at bedtime. Vladimir Crofts, MD Taking Active            Med Note Daron Offer A   Wed Aug 08, 2021 10:00 AM) Prescribed by Dr. Manuella Ghazi  doxycycline (VIBRAMYCIN) 50 MG capsule [id number] Yes Take one cap po QD with food. Ralene Bathe, MD Taking Active   DULoxetine (CYMBALTA) 60 MG capsule [id number]  TAKE 1 CAPSULE BY MOUTH EVERY DAY [name], MD  Active   fluticasone [facility]) 50 MCG/ACT nasal spray [id number] Yes  SPRAY 2 SPRAYS INTO EACH  NOSTRIL EVERY DAY Bo Merino, FNP Taking Active   ketoconazole (NIZORAL) 2 % shampoo [id number] Yes Shampoo scalp 3 times a week, let sit 10 minutes and rinse out Ralene Bathe, MD Taking Active   loratadine (CLARITIN) 10 MG tablet [id number] No Take 1 tablet (10 mg total) by mouth 2 (two) times daily.  Patient not taking: Reported on 08/08/2021   [name], MD Not Taking Active   losartan (COZAAR) 25 MG tablet [id number]  TAKE 1 TABLET DAILY [name], MD  Active   memantine [facility]) 5 MG tablet [id number] Yes Take 5 mg by mouth 2 (two) times daily. [provider] Taking Active            Med Note Michaelle Birks, Cathe Mons A   Wed Aug 08, 2021 10:00 AM) Prescribed by Dr. Manuella Ghazi  mometasone (ELOCON) 0.1 % lotion [id number]  Apply topically as directed. Apply 1-2 gtts to aa itchy scalp qd up to 5 days a week Ralene Bathe, MD  Active   olopatadine (PATANOL) 0.1 % ophthalmic solution [id number] Yes Place 1 drop into both eyes 2 (two) times daily. [name], MD Taking Active   silodosin (RAPAFLO) 8 MG CAPS capsule [id number] No Take 1 capsule (8 mg total) by mouth daily with breakfast.  Patient not taking: Reported on 08/08/2021   Abbie Sons, MD Not Taking Active   tadalafil (CIALIS) 20 MG tablet [id number]  0.5-1 tab p.o. 1 hour prior to intercourse Abbie Sons, MD  Active   traZODone (DESYREL) 100 MG tablet [id number]  TAKE 1 TABLET AT BEDTIME [name], MD  Active   Vibegron (GEMTESA) 75 MG TABS [id number] No Take 75 mg by mouth daily.  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[place]) 12/09/2016    History of alcoholism ([place]) 07/22/2016   Cervical radiculitis 01/09/2016   Lumbosacral radiculitis 01/09/2016   Osteoarthritis of carpometacarpal (CMC) joint of thumb 01/09/2016   Tinea cruris 03/21/2015   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/2015   Depression with anxiety 11/28/2014   Chronic constipation 11/28/2014   Insomnia 11/28/2014   Dyslipidemia 11/28/2014   Fatty infiltration of liver 11/28/2014   Essential (primary) hypertension 11/28/2014   Gastric reflux 11/28/2014   Memory loss or impairment 11/28/2014   Perennial allergic rhinitis with seasonal variation 11/28/2014   Vitamin D deficiency 11/28/2014   Tenosynovitis of thumb 11/28/2014   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/16/2014    Immunization History  Administered Date(s) Administered   Fluad Quad(high Dose 65+) 02/09/2019, 03/10/2020, 06/19/2021   Hepatitis A 02/25/2013, 11/12/2013   Hepatitis A, Adult 03/24/2017   Hepatitis B 02/25/2013, 11/12/2013   Hepatitis B, adult 11/28/2014, 03/24/2017   Influenza, High Dose Seasonal PF 04/08/2018   Influenza,inj,Quad PF,6+ Mos 03/08/2015, 05/28/2016, 04/10/2017   Influenza-Unspecified 02/25/2013, 03/01/2014   Moderna Sars-Covid-2 Vaccination 07/30/2019, 08/27/2019, 04/19/2020, 01/03/2021   Pneumococcal Conjugate-13 04/08/2018   Pneumococcal Polysaccharide-23 03/21/2015,  09/11/2020   Tdap 12/02/2012   Zoster, Live 03/08/2015    Conditions to be addressed/monitored:  Hypertension, Hyperlipidemia, Diabetes, Depression, Anxiety, Osteoarthritis, and Memory loss   Care Plan : General Pharmacy (Adult)  Updates made by Germaine Pomfret, RPH since 08/16/2021 12:00 AM     Problem: Hypertension, Hyperlipidemia, Diabetes, Depression, Anxiety, Osteoarthritis, and Memory loss   Priority: High  Onset Date: 08/16/2021     Long-Range Goal: Patient-Specific Goal   Start Date: 08/16/2021  Expected End Date:  08/17/2022  This Visit's Progress: On track  Priority: High  Note:   Current Barriers:  No barriers noted  Pharmacist Clinical Goal(s):  Patient will maintain control of blood pressure as evidenced by BP less than 140/90  through collaboration with PharmD and provider.   Interventions: 1:1 collaboration with [name], MD regarding development and update of comprehensive plan of care as evidenced by provider attestation and co-signature Inter-disciplinary care team collaboration (see longitudinal plan of care) Comprehensive medication review performed; medication list updated in electronic medical record  Hypertension (BP goal <140/90) -Controlled -Current treatment: Losartan 25 mg daily: Appropriate, Effective, Safe, Accessible  -Medications previously tried: NA  -Current home readings: Does not monitor routinely at home.  -Reports hypotensive/hypertensive symptoms: blurred vision, dizziness when standing up too fast.  -Recommended to continue current medication  Hyperlipidemia: (LDL goal < 100) -Controlled -Current treatment: Atorvastatin 80 mg daily: Appropriate, Effective, Safe, Accessible   -Medications previously tried: NA  -Recommended to continue current medication  Diabetes (A1c goal <7%) -Controlled -Current medications: None -Medications previously tried: Metformin XR   -Current home glucose readings -Denies hypoglycemic/hyperglycemic symptoms -Recommended to continue current medication  Depression/Anxiety (Goal: Maintain symptom remission) -Controlled -Current treatment: Duloxetine 60 mg daily: Appropriate, Effective, Safe, Accessible   -Medications previously tried/failed: NA -PHQ9: 2 -GAD7: 0 -Recommended to continue current medication  Insomnia (Goal: Improve sleep) -Controlled -Current treatment  Trazodone 100 mg nightly: Appropriate, Effective, Safe, Accessible   -Medications previously tried: NA  -Sleeps ok with trazodone, reports 6 hours of  sleep. Feels the dose is becoming less effective over time. Wakes up 4-5 times throughout the night to sleep. Takes <30 minutes to fall asleep.  -Recommended to continue current medication  Memory Loss (Goal: Prevent memory loss) -Controlled -Current treatment  Donezpeil 5 mg nightly: Appropriate, Effective, Safe, Accessible  -Medications previously tried: NA  -Recommended to continue current medication  Patient Goals/Self-Care Activities Patient will:  - check blood pressure weekly, document, and provide at future appointments  Follow Up Plan: No further follow up required: Patient to follow-up with clinical pharmacist team as needed      Medication Assistance: None required.  Patient affirms current coverage meets needs.  Compliance/Adherence/Medication fill history: Care Gaps: Zoster Vaccines Diabetic Eye Exam Diabetic Foot Exam  Star-Rating Drugs: Losartan 25 mg last filled on 05/27/2021 for a 90-Day supply with [facility] Atorvastatin 80 mg last filled on 05/27/2021 for a 90-Day supply with [facility]  [facility]  [id number]: [phone]: [phone] [facility] [place] [facility] [id number]: [phone]: [phone] Uses pill box? Yes Pt endorses 100% compliance  We discussed: Current pharmacy is preferred with insurance plan and patient is satisfied with pharmacy services Patient decided to: Continue current medication management strategy  Care  Plan and Follow Up Patient Decision:  Patient agrees to Care Plan and Follow-up.  Plan: No further follow up required: Patient to follow-up with clinical pharmacist team as needed  Malva Limes, [place] Pharmacist Practitioner  [facility] [phone]

## 2021-08-09 ENCOUNTER — Other Ambulatory Visit: Payer: Self-pay | Admitting: Family Medicine

## 2021-08-09 DIAGNOSIS — F5101 Primary insomnia: Secondary | ICD-10-CM

## 2021-08-09 DIAGNOSIS — I10 Essential (primary) hypertension: Secondary | ICD-10-CM

## 2021-08-13 ENCOUNTER — Telehealth: Payer: Self-pay

## 2021-08-13 NOTE — Progress Notes (Signed)
Chronic Care Management Pharmacy Assistant   Name: David Skinner  MRN: [id number] DOB: 06-13-1951  Patient Assistance Application for David Skinner   I received a task from David Skinner, CPP requesting that I start the process for the patient for patient assistance for his Gemtesa 75 mg tablet.   I will start the application process for the patient through David Skinner.  Once the patient has completed their portion of the application they will have to take it to Dr. John Skinner for completion. The patient can have Dr. Dene Skinner office fax the application to David Skinner @ [phone], or they can return it to David Skinner to have the CPP fax it for them.  I reached out to the patient so I could inform them that the application process has been started and to find out if they would like to pick the application up at David Skinner or if they would like the application mailed. Unfortunately, I was unable to speak with the patient, and their voicemail has not been setup so I was not able to leave a message.   Application has been e-mailed to CPP for printing. CPP aware I have not been able to speak with the patient at this time to clarify if they will pick the application up or prefer it to be mailed.   Per CPP patient requested application to be mailed to the home address on file.   Medications: Outpatient Encounter Medications as of 08/13/2021  Medication Sig Note   albuterol (VENTOLIN HFA) 108 (90 Base) MCG/ACT inhaler TAKE 2 PUFFS BY MOUTH EVERY 6 HOURS AS NEEDED FOR WHEEZE OR SHORTNESS OF BREATH    aspirin EC 81 MG tablet Take 1 tablet (81 mg total) by mouth daily.    atorvastatin (LIPITOR) 80 MG tablet TAKE 1 TABLET BY MOUTH EVERY DAY    carboxymethylcellulose (REFRESH PLUS) 0.5 % SOLN Place 1 drop into both eyes 3 (three) times daily as needed.    donepezil (ARICEPT) 10 MG tablet Take 10 mg by mouth at bedtime. 08/08/2021: Prescribed by Dr. Manuella Skinner   doxycycline (VIBRAMYCIN) 50 MG  capsule Take one cap po QD with food.    DULoxetine (CYMBALTA) 60 MG capsule TAKE 1 CAPSULE BY MOUTH EVERY DAY    fluticasone (FLONASE) 50 MCG/ACT nasal spray SPRAY 2 SPRAYS INTO EACH NOSTRIL EVERY DAY    ketoconazole (NIZORAL) 2 % shampoo Shampoo scalp 3 times a week, let sit 10 minutes and rinse out    loratadine (CLARITIN) 10 MG tablet Take 1 tablet (10 mg total) by mouth 2 (two) times daily. (Patient not taking: Reported on 08/08/2021)    losartan (COZAAR) 25 MG tablet Take 1 tablet (25 mg total) by mouth daily.    memantine (NAMENDA) 5 MG tablet Take 5 mg by mouth 2 (two) times daily. 08/08/2021: Prescribed by Dr. Manuella Skinner   mometasone (ELOCON) 0.1 % lotion Apply topically as directed. Apply 1-2 gtts to aa itchy scalp qd up to 5 days a week    olopatadine (PATANOL) 0.1 % ophthalmic solution Place 1 drop into both eyes 2 (two) times daily.    silodosin (RAPAFLO) 8 MG CAPS capsule Take 1 capsule (8 mg total) by mouth daily with breakfast. (Patient not taking: Reported on 08/08/2021)    tadalafil (CIALIS) 20 MG tablet 0.5-1 tab p.o. 1 hour prior to intercourse    traZODone (DESYREL) 100 MG tablet Take 1 tablet (100 mg total) by mouth at bedtime.    Vibegron (GEMTESA) 75 MG TABS  Take 75 mg by mouth daily. (Patient not taking: Reported on 06/19/2021) 06/19/2021: Pt states not taking due to cost    No facility-administered encounter medications on file as of 08/13/2021.   David Skinner, CPA/CMA Clinical Pharmacist Assistant Phone: [phone] '

## 2021-08-14 DIAGNOSIS — E785 Hyperlipidemia, unspecified: Secondary | ICD-10-CM | POA: Diagnosis not present

## 2021-08-14 DIAGNOSIS — I10 Essential (primary) hypertension: Secondary | ICD-10-CM

## 2021-08-14 DIAGNOSIS — E1140 Type 2 diabetes mellitus with diabetic neuropathy, unspecified: Secondary | ICD-10-CM

## 2021-08-14 DIAGNOSIS — F418 Other specified anxiety disorders: Secondary | ICD-10-CM | POA: Diagnosis not present

## 2021-08-14 DIAGNOSIS — N521 Erectile dysfunction due to diseases classified elsewhere: Secondary | ICD-10-CM | POA: Diagnosis not present

## 2021-08-14 DIAGNOSIS — F325 Major depressive disorder, single episode, in full remission: Secondary | ICD-10-CM | POA: Diagnosis not present

## 2021-08-16 NOTE — Patient Instructions (Signed)
Visit Information It was great speaking with you today!  Please let me know if you have any questions about our visit.   Goals Addressed             This Visit's Progress    Track and Manage My Blood Pressure-Hypertension       Timeframe:  Long-Range Goal Priority:  High Start Date: 08/16/2021                            Expected End Date: 08/17/2022                      Follow Up within 90 days   - check blood pressure weekly    Why is this important?   You won't feel high blood pressure, but it can still hurt your blood vessels.  High blood pressure can cause heart or kidney problems. It can also cause a stroke.  Making lifestyle changes like losing a little weight or eating less salt will help.  Checking your blood pressure at home and at different times of the day can help to control blood pressure.  If the doctor prescribes medicine remember to take it the way the doctor ordered.  Call the office if you cannot afford the medicine or if there are questions about it.     Notes:         Patient Care Plan: General Pharmacy (Adult)     Problem Identified: Hypertension, Hyperlipidemia, Diabetes, Depression, Anxiety, Osteoarthritis, and Memory loss   Priority: High  Onset Date: 08/16/2021     Long-Range Goal: Patient-Specific Goal   Start Date: 08/16/2021  Expected End Date: 08/17/2022  This Visit's Progress: On track  Priority: High  Note:   Current Barriers:  No barriers noted  Pharmacist Clinical Goal(s):  Patient will maintain control of blood pressure as evidenced by BP less than 140/90  through collaboration with PharmD and provider.   Interventions: 1:1 collaboration with [name], MD regarding development and update of comprehensive plan of care as evidenced by provider attestation and co-signature Inter-disciplinary care team collaboration (see longitudinal plan of care) Comprehensive medication review performed; medication list updated in electronic  medical record  Hypertension (BP goal <140/90) -Controlled -Current treatment: Losartan 25 mg daily: Appropriate, Effective, Safe, Accessible  -Medications previously tried: NA  -Current home readings: Does not monitor routinely at home.  -Reports hypotensive/hypertensive symptoms: blurred vision, dizziness when standing up too fast.  -Recommended to continue current medication  Hyperlipidemia: (LDL goal < 100) -Controlled -Current treatment: Atorvastatin 80 mg daily: Appropriate, Effective, Safe, Accessible   -Medications previously tried: NA  -Recommended to continue current medication  Diabetes (A1c goal <7%) -Controlled -Current medications: None -Medications previously tried: Metformin XR   -Current home glucose readings -Denies hypoglycemic/hyperglycemic symptoms -Recommended to continue current medication  Depression/Anxiety (Goal: Maintain symptom remission) -Controlled -Current treatment: Duloxetine 60 mg daily: Appropriate, Effective, Safe, Accessible   -Medications previously tried/failed: NA -PHQ9: 2 -GAD7: 0 -Recommended to continue current medication  Insomnia (Goal: Improve sleep) -Controlled -Current treatment  Trazodone 100 mg nightly: Appropriate, Effective, Safe, Accessible   -Medications previously tried: NA  -Sleeps ok with trazodone, reports 6 hours of sleep. Feels the dose is becoming less effective over time. Wakes up 4-5 times throughout the night to sleep. Takes <30 minutes to fall asleep.  -Recommended to continue current medication  Memory Loss (Goal: Prevent memory loss) -Controlled -Current treatment  Donezpeil  5 mg nightly: Appropriate, Effective, Safe, Accessible  -Medications previously tried: NA  -Recommended to continue current medication  Patient Goals/Self-Care Activities Patient will:  - check blood pressure weekly, document, and provide at future appointments  Follow Up Plan: No further follow up required: Patient to  follow-up with clinical pharmacist team as needed    David Skinner was given information about Chronic Care Management services today including:  CCM service includes personalized support from designated clinical staff supervised by his physician, including individualized plan of care and coordination with other care providers 24/7 contact phone numbers for assistance for urgent and routine care needs. Standard insurance, coinsurance, copays and deductibles apply for chronic care management only during months in which we provide at least 20 minutes of these services. Most insurances cover these services at 100%, however patients may be responsible for any copay, coinsurance and/or deductible if applicable. This service may help you avoid the need for more expensive face-to-face services. Only one practitioner may furnish and bill the service in a calendar month. The patient may stop CCM services at any time (effective at the end of the month) by phone call to the office staff.  Patient agreed to services and verbal consent obtained.   Patient verbalizes understanding of instructions and care plan provided today and agrees to view in [place]. Active MyChart status confirmed with patient.    Malva Limes, [place] Pharmacist Practitioner  [facility] [phone]

## 2021-08-27 ENCOUNTER — Other Ambulatory Visit: Payer: Self-pay | Admitting: Family Medicine

## 2021-08-27 DIAGNOSIS — I10 Essential (primary) hypertension: Secondary | ICD-10-CM

## 2021-09-13 ENCOUNTER — Ambulatory Visit: Payer: Medicare Other | Admitting: Dermatology

## 2021-10-18 ENCOUNTER — Ambulatory Visit (INDEPENDENT_AMBULATORY_CARE_PROVIDER_SITE_OTHER): Payer: Medicare Other | Admitting: Dermatology

## 2021-10-18 DIAGNOSIS — L280 Lichen simplex chronicus: Secondary | ICD-10-CM

## 2021-10-18 DIAGNOSIS — L739 Follicular disorder, unspecified: Secondary | ICD-10-CM

## 2021-10-18 DIAGNOSIS — L719 Rosacea, unspecified: Secondary | ICD-10-CM

## 2021-10-18 DIAGNOSIS — L708 Other acne: Secondary | ICD-10-CM

## 2021-10-18 DIAGNOSIS — L299 Pruritus, unspecified: Secondary | ICD-10-CM

## 2021-10-18 DIAGNOSIS — L219 Seborrheic dermatitis, unspecified: Secondary | ICD-10-CM

## 2021-10-18 MED ORDER — KETOCONAZOLE 2 % EX SHAM: MEDICATED_SHAMPOO | CUTANEOUS | 6 refills | Status: DC

## 2021-10-18 MED ORDER — DOXYCYCLINE HYCLATE 50 MG PO CAPS: ORAL_CAPSULE | ORAL | 2 refills | Status: DC

## 2021-10-18 MED ORDER — CLOBETASOL PROPIONATE 0.05 % EX SOLN: 1.0000 "application " | CUTANEOUS | 2 refills | Status: DC

## 2021-10-18 NOTE — Progress Notes (Signed)
   Follow-Up Visit   Subjective  David Skinner is a 71 y.o. male who presents for the following: Follow-up (Patient here today with interpretor for follow up on folliculitis and seb derm at scalp, using mometasone cream, ketoconazole shampoo, doxycycline 50 mg qd.  And states that medication has not been helping and he is still very itchy at scalp. ).  The following portions of the chart were reviewed this encounter and updated as appropriate:  Tobacco  Allergies  Meds  Problems  Med Hx  Surg Hx  Fam Hx     Review of Systems: No other skin or systemic complaints except as noted in HPI or Assessment and Plan.  Objective  Well appearing patient in no apparent distress; mood and affect are within normal limits.  A focused examination was performed including scalp. Relevant physical exam findings are noted in the Assessment and Plan.  Scalp Papules and pustules of the scalp.   Scalp Papules and pustules of the scalp.            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 Condition is bothersome/symptomatic for patient. Currently flared. Seborrheic Dermatitis  -  is a chronic persistent rash characterized by pinkness and scaling most commonly of the mid face but also can occur on the scalp (dandruff), ears; mid chest, mid back and groin.  It tends to be exacerbated by stress and cooler weather.  People who have neurologic disease may experience new onset or exacerbation of existing seborrheic dermatitis.  The condition is not curable but treatable and can be controlled.  Treatable but not currable    D/c Mometasone solution  Start Clobetasol 0.05 %  external solution - apply topically to aa's of scalp nightly 5 days weekly.   Continue Ketoconazole 2% shampoo let sit 3 - 5 minutes then wash out. Use three days per week.    Increase Doxycycline 50 mg capsule from po qd to 2 caps in evening with food.  Will consider minocycline in future if no improvement.   Topical steroids (such as triamcinolone, fluocinolone, fluocinonide, mometasone, clobetasol, halobetasol, betamethasone, hydrocortisone) can cause thinning and lightening of the skin if they are used for too long in the same area. Your physician has selected the right strength medicine for your problem and area affected on the body. Please use your medication only as directed by your physician to prevent side effects.   clobetasol (TEMOVATE) 0.05 % external solution - Scalp Apply 1 application. topically See admin instructions. Apply topically to aa's of scalp nightly 5 days weekly.  Avoid applying to face, groin, and axilla. Use as directed.  Return in about 2 months (around 12/18/2021) for seb derm, folliuculitis with pruritis follow up. IRuthell Rummage, CMA, am acting as scribe for Sarina Ser, MD. Documentation: I have reviewed the above documentation for accuracy and completeness, and I agree with the above.  Sarina Ser, MD

## 2021-10-18 NOTE — Patient Instructions (Addendum)
For Scalp ? ?Start Doxycycline 50 mg capsule take 2 capsules in evening with food ?Start Clobetasol solution - apply topically to scalp nightly 5 times a week ?Continue Ketoconazole Shampoo - apply three times per week, massage into scalp and leave in for 3 - 5 minutes before rinsing out ? ?Doxycycline should be taken with food to prevent nausea. Do not lay down for 30 minutes after taking. Be cautious with sun exposure and use good sun protection while on this medication. Pregnant women should not take this medication.  ? ?Topical steroids (such as triamcinolone, fluocinolone, fluocinonide, mometasone, clobetasol, halobetasol, betamethasone, hydrocortisone) can cause thinning and lightening of the skin if they are used for too long in the same area. Your physician has selected the right strength medicine for your problem and area affected on the body. Please use your medication only as directed by your physician to prevent side effects.  ? ? ? ?If You Need Anything After Your Visit ? ?If you have any questions or concerns for your doctor, please call our main line at [phone] and press option 4 to reach your doctor's medical assistant. If no one answers, please leave a voicemail as directed and we will return your call as soon as possible. Messages left after 4 pm will be answered the following business day.  ? ?You may also send David Skinner a message via MyChart. We typically respond to MyChart messages within 1-2 business days. ? ?For prescription refills, please ask your pharmacy to contact our office. Our fax number is [phone]. ? ?If you have an urgent issue when the clinic is closed that cannot wait until the next business day, you can page your doctor at the number below.   ? ?Please note that while we do our best to be available for urgent issues outside of office hours, we are not available 24/7.  ? ?If you have an urgent issue and are unable to reach David Skinner, you may choose to seek medical care at your doctor's  office, retail clinic, urgent care center, or emergency room. ? ?If you have a medical emergency, please immediately call 911 or go to the emergency department. ? ?Pager Numbers ? ?- Dr. Nehemiah Massed: [phone] ? ?- Dr. Laurence Ferrari: [phone] ? ?- Dr. Nicole Kindred: [phone] ? ?In the event of inclement weather, please call our main line at [phone] for an update on the status of any delays or closures. ? ?Dermatology Medication Tips: ?Please keep the boxes that topical medications come in in order to help keep track of the instructions about where and how to use these. Pharmacies typically print the medication instructions only on the boxes and not directly on the medication tubes.  ? ?If your medication is too expensive, please contact our office at [phone] option 4 or send David Skinner a message through [place].  ? ?We are unable to tell what your co-pay for medications will be in advance as this is different depending on your insurance coverage. However, we may be able to find a substitute medication at lower cost or fill out paperwork to get insurance to cover a needed medication.  ? ?If a prior authorization is required to get your medication covered by your insurance company, please allow David Skinner 1-2 business days to complete this process. ? ?Drug prices often vary depending on where the prescription is filled and some pharmacies may offer cheaper prices. ? ?The website www.goodrx.com contains coupons for medications through different pharmacies. The prices here do not account for what the cost may  be with help from insurance (it may be cheaper with your insurance), but the website can give you the price if you did not use any insurance.  ?- You can print the associated coupon and take it with your prescription to the pharmacy.  ?- You may also stop by our office during regular business hours and pick up a GoodRx coupon card.  ?- If you need your prescription sent electronically to a different pharmacy, notify our office  through [facility] or by phone at [phone] option 4. ? ? ? ? ?Si Usted Necesita Algo Despu?s de Su Visita ? ?Tambi?n puede enviarnos un mensaje a trav?s de MyChart. Por lo general respondemos a los mensajes de MyChart en el transcurso de 1 a 2 d?as h?biles. ? ?Para renovar recetas, por favor pida a su farmacia que se ponga en contacto con nuestra oficina. Nuestro n?mero de fax es el [phone]. ? ?Si tiene un asunto urgente cuando la cl?nica est? cerrada y que no puede esperar hasta el siguiente d?a h?bil, puede llamar/localizar a su doctor(a) al n?mero que aparece a continuaci?n.  ? ?Por favor, tenga en cuenta que aunque hacemos todo lo posible para estar disponibles para asuntos urgentes fuera del horario de oficina, no estamos disponibles las 24 horas del d?a, los 7 d?as de la semana.  ? ?Si tiene un problema urgente y no puede comunicarse con nosotros, puede optar por buscar atenci?n m?dica  en el consultorio de su doctor(a), en una cl?nica privada, en un centro de atenci?n urgente o en una sala de emergencias. ? ?Si tiene Engineer, maintenance (IT) m?dica, por favor llame inmediatamente al 911 o vaya a la sala de emergencias. ? ?N?meros de b?per ? ?- Dr. Nehemiah Massed: [phone] ? ?- Dra. Moye: [phone] ? ?- Dra. Nicole Kindred: [phone] ? ?En caso de inclemencias del tiempo, por favor llame a nuestra l?nea principal al [phone] para una actualizaci?n sobre el estado de cualquier retraso o cierre. ? ?Consejos para la medicaci?n en dermatolog?a: ?Por favor, guarde las cajas en las que vienen los medicamentos de uso t?pico para ayudarle a seguir las instrucciones sobre d?nde y c?mo usarlos. Las farmacias generalmente imprimen las instrucciones del medicamento s?lo en las cajas y no directamente en los tubos del [place].  ? ?Si su medicamento es muy caro, por favor, p?ngase en contacto con Zigmund Daniel llamando al [phone] y presione la opci?n 4 o env?enos un mensaje a trav?s de MyChart.  ? ?No  podemos decirle cu?l ser? su copago por los medicamentos por adelantado ya que esto es diferente dependiendo de la cobertura de su seguro. Sin embargo, es posible que podamos encontrar un medicamento sustituto a Electrical engineer un formulario para que el seguro cubra el medicamento que se considera necesario.  ? ?Si se requiere Ardelia Mems autorizaci?n previa para que su compa??a de seguros [place] su medicamento, por favor perm?tanos de 1 a 2 d?as h?biles para completar este proceso. ? ?Los precios de los medicamentos var?an con frecuencia dependiendo del Environmental consultant de d?nde se surte la receta y alguna farmacias pueden ofrecer precios m?s baratos. ? ?El sitio web www.goodrx.com tiene cupones para medicamentos de Airline pilot. Los precios aqu? no tienen en cuenta lo que podr?a costar con la ayuda del seguro (puede ser m?s barato con su seguro), pero el sitio web puede darle el precio si no utiliz? ning?n seguro.  ?- Puede imprimir el cup?n correspondiente y llevarlo con su receta a la farmacia.  ?- Tambi?n puede pasar por nuestra oficina  durante el horario de atenci?n regular y Charity fundraiser una tarjeta de cupones de GoodRx.  ?- Si necesita que su receta se env?e electr?nicamente a Chiropodist, informe a nuestra oficina a trav?s de MyChart de [place] o por tel?fono llamando al [phone] y presione la opci?n 4. ? ?

## 2021-11-05 ENCOUNTER — Encounter: Payer: Self-pay | Admitting: Dermatology

## 2021-11-05 ENCOUNTER — Ambulatory Visit: Payer: Self-pay | Admitting: Urology

## 2021-11-07 ENCOUNTER — Ambulatory Visit (INDEPENDENT_AMBULATORY_CARE_PROVIDER_SITE_OTHER): Payer: Medicare Other | Admitting: Urology

## 2021-11-07 ENCOUNTER — Encounter: Payer: Self-pay | Admitting: Urology

## 2021-11-07 VITALS — BP 120/70 | HR 74 | Ht 60.0 in | Wt 145.0 lb

## 2021-11-07 DIAGNOSIS — R3915 Urgency of urination: Secondary | ICD-10-CM | POA: Diagnosis not present

## 2021-11-07 DIAGNOSIS — Z125 Encounter for screening for malignant neoplasm of prostate: Secondary | ICD-10-CM | POA: Diagnosis not present

## 2021-11-07 DIAGNOSIS — N138 Other obstructive and reflux uropathy: Secondary | ICD-10-CM | POA: Diagnosis not present

## 2021-11-07 DIAGNOSIS — N401 Enlarged prostate with lower urinary tract symptoms: Secondary | ICD-10-CM

## 2021-11-07 LAB — BLADDER SCAN AMB NON-IMAGING: Scan Result: 0

## 2021-11-07 MED ORDER — SILODOSIN 8 MG PO CAPS: 8.0000 mg | ORAL_CAPSULE | Freq: Every day | ORAL | 11 refills | Status: DC

## 2021-11-07 MED ORDER — GEMTESA 75 MG PO TABS: 75.0000 mg | ORAL_TABLET | Freq: Every day | ORAL | 0 refills | Status: DC

## 2021-11-07 NOTE — Progress Notes (Signed)
11/07/2021 1:15 PM   David Skinner [birth year] [id number]  Referring provider: [name], MD [address] [place],  [place] [address]   Urologic history: 1. BPH with LUTS Silodosin 8 mg daily  2.  Erectile dysfunction Tadalafil 20 mg prn   HPI: 71 y.o. male presents for annual follow-up.  A [place] interpreter was present via video link.  Remains on silodosin  Nocturia and urinary frequency have improved.  Still has some urgency Gemtesa samples given last year were effective but he had to discontinue due to cost Cystoscopy in 2019 showed no significant lateral lobe enlargement or bladder mucosal abnormalities Stable ED on tadalafil   PMH: Past Medical History:  Diagnosis Date   Allergy    Anxiety    Atopy    Cataract of right eye    [place]   Chronic constipation    Elevated LFTs    Fatty liver    GERD (gastroesophageal reflux disease)    Hyperlipidemia    Hypertension    Hypoglycemia    Insomnia    Memory change    Overweight    Paresthesia of hand    Seizure ([place])    12/12, 09/27/14, 02/28/16   Tenosynovitis of finger    Tinea cruris    Vitamin D deficiency     Surgical History: Past Surgical History:  Procedure Laterality Date   COLONOSCOPY WITH PROPOFOL N/A 03/25/2017   Procedure: COLONOSCOPY WITH PROPOFOL;  Surgeon: Lin Landsman, MD;  Location: [facility];  Service: Endoscopy;  Laterality: N/A;   COLONOSCOPY WITH PROPOFOL N/A 01/16/2021   Procedure: COLONOSCOPY WITH PROPOFOL;  Surgeon: [name], MD;  [facility] ENDOSCOPY;  Service: Gastroenterology;  Laterality: N/A;  USE [place] VIDEO INTERPRETER   ESOPHAGOGASTRODUODENOSCOPY N/A 03/25/2017   Procedure: ESOPHAGOGASTRODUODENOSCOPY (EGD);  Surgeon: Lin Landsman, MD;  Location: [facility];  Service: Endoscopy;  Laterality: N/A;  Diabetic - oral meds   LASIK Bilateral     Home Medications:  Allergies as of 11/07/2021        Reactions   Ibuprofen Itching   Levofloxacin    Lisinopril Cough   Metoprolol    Nsaids Itching   Tolmetin Itching   Tramadol Hcl Itching   Tylenol [acetaminophen] Itching        Medication List        Accurate as of Nov 07, 2021  1:15 PM. If you have any questions, ask your nurse or doctor.          albuterol 108 (90 Base) MCG/ACT inhaler Commonly known as: VENTOLIN HFA TAKE 2 PUFFS BY MOUTH EVERY 6 HOURS AS NEEDED FOR WHEEZE OR SHORTNESS OF BREATH   aspirin EC 81 MG tablet Take 1 tablet (81 mg total) by mouth daily.   atorvastatin 80 MG tablet Commonly known as: LIPITOR TAKE 1 TABLET BY MOUTH EVERY DAY   carboxymethylcellulose 0.5 % Soln Commonly known as: REFRESH PLUS Place 1 drop into both eyes 3 (three) times daily as needed.   clobetasol 0.05 % external solution Commonly known as: TEMOVATE Apply 1 application. topically See admin instructions. Apply topically to aa's of scalp nightly 5 days weekly.  Avoid applying to face, groin, and axilla. Use as directed.   donepezil 10 MG tablet Commonly known as: ARICEPT Take 10 mg by mouth at bedtime.   doxycycline 50 MG capsule Commonly known as: VIBRAMYCIN Take 2 capsules po every evening with food.   DULoxetine 60 MG capsule Commonly known  as: CYMBALTA TAKE 1 CAPSULE BY MOUTH EVERY DAY   fluticasone 50 MCG/ACT nasal spray Commonly known as: FLONASE SPRAY 2 SPRAYS INTO EACH NOSTRIL EVERY DAY   Gemtesa 75 MG Tabs Generic drug: Vibegron Take 75 mg by mouth daily.   ketoconazole 2 % shampoo Commonly known as: NIZORAL Shampoo scalp 3 times a week, let sit 10 minutes and rinse out   loratadine 10 MG tablet Commonly known as: CLARITIN Take 1 tablet (10 mg total) by mouth 2 (two) times daily.   losartan 25 MG tablet Commonly known as: COZAAR TAKE 1 TABLET DAILY   memantine 5 MG tablet Commonly known as: NAMENDA Take 5 mg by mouth 2 (two) times daily.   olopatadine 0.1 % ophthalmic solution Commonly  known as: Patanol Place 1 drop into both eyes 2 (two) times daily.   silodosin 8 MG Caps capsule Commonly known as: RAPAFLO Take 1 capsule (8 mg total) by mouth daily with breakfast.   tadalafil 20 MG tablet Commonly known as: Cialis 0.5-1 tab p.o. 1 hour prior to intercourse   traZODone 100 MG tablet Commonly known as: DESYREL TAKE 1 TABLET AT BEDTIME        Allergies:  Allergies  Allergen Reactions   Ibuprofen Itching   Levofloxacin    Lisinopril Cough   Metoprolol    Nsaids Itching   Tolmetin Itching   Tramadol Hcl Itching   Tylenol [Acetaminophen] Itching    Family History: Family History  Problem Relation Age of Onset   Diabetes Mother    Heart disease Mother     Social History:  reports that he quit smoking about 21 years ago. His smoking use included cigarettes. He started smoking about 37 years ago. He has a 15.00 pack-year smoking history. He has never used smokeless tobacco. He reports current alcohol use of about 1.0 standard drink per week. He reports that he does not use drugs.   Physical Exam: BP 120/70   Pulse 74   Ht 5' (1.524 m)   Wt 145 lb (65.8 kg)   BMI 28.32 kg/m   Constitutional:  Alert and oriented, No acute distress. HEENT: [place] AT, moist mucus membranes.  Trachea midline, no masses. Cardiovascular: No clubbing, cyanosis, or edema. Respiratory: Normal respiratory effort, no increased work of breathing. GI: Abdomen is soft, nontender, nondistended, no abdominal masses   Assessment & Plan:    1.  BPH with LUTS Stable He would like to try Gemtesa again and see if insurance coverage is better this year Urinalysis today clear  2.  Erectile dysfunction Stable on tadalafil  3.  Prostate cancer screening Last PSA was in 2021 and lab visit scheduled    Abbie Sons, MD  [facility] [address] [place], [place] [address] [phone]

## 2021-11-08 ENCOUNTER — Telehealth: Payer: Self-pay | Admitting: *Deleted

## 2021-11-08 DIAGNOSIS — R972 Elevated prostate specific antigen [PSA]: Secondary | ICD-10-CM

## 2021-11-08 LAB — URINALYSIS, COMPLETE
Bilirubin, UA: NEGATIVE
Glucose, UA: NEGATIVE
Ketones, UA: NEGATIVE
Leukocytes,UA: NEGATIVE
Nitrite, UA: NEGATIVE
Protein,UA: NEGATIVE
RBC, UA: NEGATIVE
Specific Gravity, UA: >1.03 — ABNORMAL HIGH (ref 1.005–1.030)
Urobilinogen, Ur: 0.2 mg/dL (ref 0.2–1.0)
pH, UA: 6 (ref 5.0–7.5)

## 2021-11-08 LAB — MICROSCOPIC EXAMINATION: Bacteria, UA: NONE SEEN

## 2021-11-08 NOTE — Telephone Encounter (Signed)
-----   Message from Abbie Sons, MD sent at 11/08/2021 11:33 AM EDT ----- Regarding: PSA Chart reviewed-last PSA 2021.  Please schedule lab visit for PSA

## 2021-11-08 NOTE — Telephone Encounter (Signed)
Notified patient wife as instructed, patient pleased. Discussed follow-up appointments, patient agrees  

## 2021-11-15 ENCOUNTER — Other Ambulatory Visit: Payer: Medicare Other

## 2021-11-15 DIAGNOSIS — R972 Elevated prostate specific antigen [PSA]: Secondary | ICD-10-CM | POA: Diagnosis not present

## 2021-11-16 ENCOUNTER — Encounter: Payer: Self-pay | Admitting: Urology

## 2021-11-16 LAB — PSA: Prostate Specific Ag, Serum: 0.9 ng/mL (ref 0.0–4.0)

## 2021-12-04 NOTE — Progress Notes (Unsigned)
Name: David Skinner   MRN: [id number]    DOB: 04/20/1951   Date:12/05/2021       Progress Note  Subjective  Chief Complaint  Follow Up  HPI  Malnutrition: he has lost 10 lbs in the past 6 months , that is 7.5 % weight loss in 6 months, and he has lost 10 % of his weight in the past year. He states lack of appetite, he states noticed most of the lack of appetite in the past 4 months. He has noticed a change in bowel movement either very hard stools to diarrhea. He has bowel movements about 4 times a day He denies nausea, vomiting , he has abdominal cramping   Intermittent left knee pain: dull aching, advised ice and tylenol prn, since mild and intermittent   Major depression recurrent : going on for years but  worse in  2018, he is on Cymbalta since April 2018, he states he seems to be feeling well, he needs refills of his medicaitons. He states he feels happy   Hyperlipidemia :he is taking Atorvastatin  80 mg daily No myalgia or chest pain. Last LDL was up from 59  to 80 . Needs to resume a healthier diet    OSA/minimal/RLS: he sees Dr Manuella Ghazi for memory loss and had sleep study 2018 it showed minimal sleep apnea and RLS - seeing Dr. Manuella Ghazi. Reminded him importance of compliance  HTN: BP is at goal, no chest pain or palpitation   Alcoholism : he used to drink heavy while in [place] used to drink liquor at least 3 times a week and used to get drunk, when he moved to [place] ( 1979) . He was still drinking in [place] usually beer 2017. He states he started drinking alcohol again, and on his last visit was drinkin beer and hard liquor, but no alcohol since early 2022, he is drinking every weekend again about half a bottle of liquor . Advised to cut down on alcohol intake    Urinary frequency: under the care of Urologist, recent visit 10/2021   Partial Seizure: Last episode 02/2016 and went to [facility],taking medication daily now and is seeing Dr. Manuella Ghazi. 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 He  denies polyphagia or polydipsia, has urinary frequency due to LUTS  Continues statin therapy. 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 01/09/2016   Tinea cruris 03/21/2015   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 ([place]) 03/09/2015   Depression with anxiety 11/28/2014   Chronic constipation 11/28/2014   Insomnia 11/28/2014   Dyslipidemia 11/28/2014   Fatty infiltration of liver 11/28/2014   Essential (primary) hypertension 11/28/2014   Gastric reflux 11/28/2014  Memory loss or impairment 11/28/2014   Perennial allergic rhinitis with seasonal variation 11/28/2014   Vitamin D deficiency 11/28/2014   Tenosynovitis of thumb 11/28/2014   Partial epilepsy with impairment of consciousness ([place]) 11/16/2014    Past Surgical History:  Procedure Laterality Date   COLONOSCOPY WITH PROPOFOL N/A 03/25/2017   Procedure: COLONOSCOPY WITH PROPOFOL;  Surgeon: Lin Landsman, MD;  Location: [place];  Service: Endoscopy;  Laterality: N/A;   COLONOSCOPY WITH PROPOFOL N/A 01/16/2021   Procedure: COLONOSCOPY WITH PROPOFOL;  Surgeon: [name], MD;  Location: [facility] ENDOSCOPY;  Service:  Gastroenterology;  Laterality: N/A;  USE [place] VIDEO INTERPRETER   ESOPHAGOGASTRODUODENOSCOPY N/A 03/25/2017   Procedure: ESOPHAGOGASTRODUODENOSCOPY (EGD);  Surgeon: Lin Landsman, MD;  Location: [place];  Service: Endoscopy;  Laterality: N/A;  Diabetic - oral meds   LASIK Bilateral     Family History  Problem Relation Age of Onset   Diabetes Mother    Heart disease Mother     Social History   Tobacco Use   Smoking status: Former    Packs/day: 1.00    Years: 15.00    Total pack years: 15.00    Types: Cigarettes    Start date: 06/17/1984    Quit date: 11/28/1999    Years since quitting: 22.0   Smokeless tobacco: Never  Substance Use Topics   Alcohol use: Yes    Alcohol/week: 1.0 standard drink of alcohol    Types: 1 Cans of beer per week    Comment: occassional beer     Current Outpatient Medications:    albuterol (VENTOLIN HFA) 108 (90 Base) MCG/ACT inhaler, TAKE 2 PUFFS BY MOUTH EVERY 6 HOURS AS NEEDED FOR WHEEZE OR SHORTNESS OF BREATH, Disp: 18 g, Rfl: 0   aspirin EC 81 MG tablet, Take 1 tablet (81 mg total) by mouth daily., Disp: 30 tablet, Rfl: 0   atorvastatin (LIPITOR) 80 MG tablet, TAKE 1 TABLET BY MOUTH EVERY DAY, Disp: 90 tablet, Rfl: 3   carboxymethylcellulose (REFRESH PLUS) 0.5 % SOLN, Place 1 drop into both eyes 3 (three) times daily as needed., Disp: , Rfl:    clobetasol (TEMOVATE) 0.05 % external solution, Apply 1 application. topically See admin instructions. Apply topically to aa's of scalp nightly 5 days weekly.  Avoid applying to face, groin, and axilla. Use as directed., Disp: 50 mL, Rfl: 2   donepezil (ARICEPT) 10 MG tablet, Take 10 mg by mouth at bedtime., Disp: , Rfl:    doxycycline (VIBRAMYCIN) 50 MG capsule, Take 2 capsules po every evening with food., Disp: 60 capsule, Rfl: 2   DULoxetine (CYMBALTA) 60 MG capsule, TAKE 1 CAPSULE BY MOUTH EVERY DAY, Disp: 90 capsule, Rfl: 1   fluticasone (FLONASE) 50 MCG/ACT nasal spray, SPRAY 2  SPRAYS INTO EACH NOSTRIL EVERY DAY, Disp: 48 mL, Rfl: 1   ketoconazole (NIZORAL) 2 % shampoo, Shampoo scalp 3 times a week, let sit 10 minutes and rinse out, Disp: 120 mL, Rfl: 6   loratadine (CLARITIN) 10 MG tablet, Take 1 tablet (10 mg total) by mouth 2 (two) times daily., Disp: 180 tablet, Rfl: 1   losartan (COZAAR) 25 MG tablet, TAKE 1 TABLET DAILY, Disp: 90 tablet, Rfl: 1   memantine (NAMENDA) 5 MG tablet, Take 5 mg by mouth 2 (two) times daily., Disp: , Rfl:    olopatadine (PATANOL) 0.1 % ophthalmic solution, Place 1 drop into both eyes 2 (two) times daily., Disp: 5 mL, Rfl: 2   silodosin (RAPAFLO) 8 MG  CAPS capsule, Take 1 capsule (8 mg total) by mouth daily with breakfast., Disp: 30 capsule, Rfl: 11   tadalafil (CIALIS) 20 MG tablet, 0.5-1 tab p.o. 1 hour prior to intercourse, Disp: 30 tablet, Rfl: 3   traZODone (DESYREL) 100 MG tablet, TAKE 1 TABLET AT BEDTIME, Disp: 90 tablet, Rfl: 1   Vibegron (GEMTESA) 75 MG TABS, Take 75 mg by mouth daily., Disp: 28 tablet, Rfl: 0  Allergies  Allergen Reactions   Ibuprofen Itching   Levofloxacin    Lisinopril Cough   Metoprolol    Nsaids Itching   Tolmetin Itching   Tramadol Hcl Itching   Tylenol [Acetaminophen] Itching    I personally reviewed active problem list, medication list, allergies, family history, social history, health maintenance with the patient/caregiver today.   ROS  Constitutional: Negative for fever, positive for weight change.  Respiratory: Negative for cough and shortness of breath.   Cardiovascular: Negative for chest pain or palpitations.  Gastrointestinal:positive  for abdominal pain, no bowel changes.  Musculoskeletal: Negative for gait problem or joint swelling.  Skin: Negative for rash.  Neurological: Negative for dizziness but he has  headaches intermittently.  No other specific complaints in a complete review of systems (except as listed in HPI above).   Objective  Vitals:   12/05/21 0848  BP: 122/74   Pulse: 74  Resp: 16  SpO2: 95%  Weight: 144 lb (65.3 kg)  Height: '5\' 5"'$  (1.651 m)    Body mass index is 23.96 kg/m.  Physical Exam  Constitutional: Patient appears well-developed and well-nourished.  No distress.  HEENT: head atraumatic, normocephalic, pupils equal and reactive to light, neck supple Cardiovascular: Normal rate, regular rhythm and normal heart sounds.  No murmur heard. No BLE edema. Pulmonary/Chest: Effort normal and breath sounds normal. No respiratory distress. Abdominal: Soft.  There is no tenderness. No masses, normal bowel sounds  Psychiatric: Patient has a normal mood and affect. behavior is normal. Judgment and thought content normal.   Recent Results (from the past 2160 hour(s))  Urinalysis, Complete     Status: Abnormal   Collection Time: 11/07/21  1:05 PM  Result Value Ref Range   Specific Gravity, UA >1.030 (H) 1.005 - 1.030   pH, UA 6.0 5.0 - 7.5   Color, UA Yellow Yellow   Appearance Ur Clear Clear   Leukocytes,UA Negative Negative   Protein,UA Negative Negative/Trace   Glucose, UA Negative Negative   Ketones, UA Negative Negative   RBC, UA Negative Negative   Bilirubin, UA Negative Negative   Urobilinogen, Ur 0.2 0.2 - 1.0 mg/dL   Nitrite, UA Negative Negative   Microscopic Examination See below:   Microscopic Examination     Status: None   Collection Time: 11/07/21  1:05 PM   Urine  Result Value Ref Range   WBC, UA 0-5 0 - 5 /hpf   RBC 0-2 0 - 2 /hpf   Epithelial Cells (non renal) 0-10 0 - 10 /hpf   Bacteria, UA None seen None seen/Few  Bladder Scan (Post Void Residual) in office     Status: None   Collection Time: 11/07/21  1:12 PM  Result Value Ref Range   Scan Result 0   PSA     Status: None   Collection Time: 11/15/21 11:25 AM  Result Value Ref Range   Prostate Specific Ag, Serum 0.9 0.0 - 4.0 ng/mL    Comment: Roche ECLIA methodology. According to the American Urological Association, Serum PSA should decrease and remain  at  undetectable levels after radical prostatectomy. The AUA defines biochemical recurrence as an initial PSA value 0.2 ng/mL or greater followed by a subsequent confirmatory PSA value 0.2 ng/mL or greater. Values obtained with different assay methods or kits cannot be used interchangeably. Results cannot be interpreted as absolute evidence of the presence or absence of malignant disease.      PHQ2/9:    12/05/2021    8:47 AM 06/19/2021    8:50 AM 06/06/2021   10:08 AM 03/07/2021    8:12 AM 09/11/2020    1:09 PM  Depression screen PHQ 2/9  Decreased Interest 0 0 0 0 0  Down, Depressed, Hopeless 0 0 0 0 0  PHQ - 2 Score 0 0 0 0 0  Altered sleeping 0 0 0  3  Tired, decreased energy 1 0 0  2  Change in appetite '3 1 1  1  '$ Feeling bad or failure about yourself  0 0 0  0  Trouble concentrating 0 1 0  0  Moving slowly or fidgety/restless 0 0 0  0  Suicidal thoughts 0 0 0  0  PHQ-9 Score '4 2 1  6    '$ phq 9 is negative   Fall Risk:    12/05/2021    8:47 AM 06/19/2021    9:03 AM 06/06/2021   10:08 AM 03/07/2021    8:12 AM 09/11/2020    1:08 PM  Fall Risk   Falls in the past year? 0 0 0 0 0  Number falls in past yr: 0 0 0  0  Injury with Fall? 0 0 0  0  Risk for fall due to : No Fall Risks No Fall Risks No Fall Risks    Follow up Falls prevention discussed Falls prevention discussed Falls prevention discussed Falls prevention discussed       Functional Status Survey: Is the patient deaf or have difficulty hearing?: Yes Does the patient have difficulty seeing, even when wearing glasses/contacts?: No Does the patient have difficulty concentrating, remembering, or making decisions?: No Does the patient have difficulty walking or climbing stairs?: No Does the patient have difficulty dressing or bathing?: No Does the patient have difficulty doing errands alone such as visiting a doctor's office or shopping?: No    Assessment & Plan  1. Type 2 diabetes mellitus with neuropathy  causing erectile dysfunction (HCC)  - POCT HgB A1C - HM Diabetes Foot Exam  2. OSA (obstructive sleep apnea)  Cannot tolerate CPAP   3. Essential (primary) hypertension  - CBC with Differential/Platelet - COMPLETE METABOLIC PANEL WITH GFR  4. Primary insomnia   5. Vitamin D deficiency  - VITAMIN D 25 Hydroxy (Vit-D Deficiency, Fractures)  6. Mild protein-calorie malnutrition ([place])  - Ambulatory referral to Gastroenterology  7. Lack of appetite  - Ambulatory referral to Gastroenterology  8. Abdominal cramping  - Ambulatory referral to Gastroenterology  9. Change in bowel movement  - Ambulatory referral to Gastroenterology  10. Weight loss, unintentional  Explained importance of stopping drinking alcohol   - TSH - CBC with Differential/Platelet - COMPLETE METABOLIC PANEL WITH GFR - Sedimentation rate - C-reactive protein  11. Encounter for screening for HIV   Insurance will not cover test

## 2021-12-05 ENCOUNTER — Encounter: Payer: Self-pay | Admitting: Family Medicine

## 2021-12-05 ENCOUNTER — Ambulatory Visit (INDEPENDENT_AMBULATORY_CARE_PROVIDER_SITE_OTHER): Payer: Medicare Other | Admitting: Family Medicine

## 2021-12-05 VITALS — BP 122/74 | HR 74 | Resp 16 | Ht 65.0 in | Wt 144.0 lb

## 2021-12-05 DIAGNOSIS — R198 Other specified symptoms and signs involving the digestive system and abdomen: Secondary | ICD-10-CM | POA: Diagnosis not present

## 2021-12-05 DIAGNOSIS — E441 Mild protein-calorie malnutrition: Secondary | ICD-10-CM | POA: Diagnosis not present

## 2021-12-05 DIAGNOSIS — E559 Vitamin D deficiency, unspecified: Secondary | ICD-10-CM

## 2021-12-05 DIAGNOSIS — Z114 Encounter for screening for human immunodeficiency virus [HIV]: Secondary | ICD-10-CM | POA: Diagnosis not present

## 2021-12-05 DIAGNOSIS — F325 Major depressive disorder, single episode, in full remission: Secondary | ICD-10-CM | POA: Diagnosis not present

## 2021-12-05 DIAGNOSIS — G4733 Obstructive sleep apnea (adult) (pediatric): Secondary | ICD-10-CM

## 2021-12-05 DIAGNOSIS — R634 Abnormal weight loss: Secondary | ICD-10-CM | POA: Diagnosis not present

## 2021-12-05 DIAGNOSIS — E1140 Type 2 diabetes mellitus with diabetic neuropathy, unspecified: Secondary | ICD-10-CM | POA: Diagnosis not present

## 2021-12-05 DIAGNOSIS — F5101 Primary insomnia: Secondary | ICD-10-CM | POA: Diagnosis not present

## 2021-12-05 DIAGNOSIS — R630 Anorexia: Secondary | ICD-10-CM

## 2021-12-05 DIAGNOSIS — N521 Erectile dysfunction due to diseases classified elsewhere: Secondary | ICD-10-CM | POA: Diagnosis not present

## 2021-12-05 DIAGNOSIS — I10 Essential (primary) hypertension: Secondary | ICD-10-CM

## 2021-12-05 DIAGNOSIS — R109 Unspecified abdominal pain: Secondary | ICD-10-CM

## 2021-12-05 LAB — POCT GLYCOSYLATED HEMOGLOBIN (HGB A1C): Hemoglobin A1C: 6.1 % — AB (ref 4.0–5.6)

## 2021-12-05 MED ORDER — DULOXETINE HCL 60 MG PO CPEP
ORAL_CAPSULE | ORAL | 1 refills | Status: DC
Start: 2021-12-05 — End: 2022-08-12

## 2021-12-05 MED ORDER — TRAZODONE HCL 100 MG PO TABS: 100.0000 mg | ORAL_TABLET | Freq: Every day | ORAL | 1 refills | Status: DC

## 2021-12-06 LAB — COMPLETE METABOLIC PANEL WITH GFR
AG Ratio: 1.4 (calc) (ref 1.0–2.5)
ALT: 36 U/L (ref 9–46)
AST: 27 U/L (ref 10–35)
Albumin: 4.3 g/dL (ref 3.6–5.1)
Alkaline phosphatase (APISO): 65 U/L (ref 35–144)
BUN: 13 mg/dL (ref 7–25)
CO2: 27 mmol/L (ref 20–32)
Calcium: 9.5 mg/dL (ref 8.6–10.3)
Chloride: 104 mmol/L (ref 98–110)
Creat: 0.98 mg/dL (ref 0.70–1.35)
Globulin: 3 g/dL (calc) (ref 1.9–3.7)
Glucose, Bld: 105 mg/dL — ABNORMAL HIGH (ref 65–99)
Potassium: 4.3 mmol/L (ref 3.5–5.3)
Sodium: 140 mmol/L (ref 135–146)
Total Bilirubin: 1 mg/dL (ref 0.2–1.2)
Total Protein: 7.3 g/dL (ref 6.1–8.1)
eGFR: 84 mL/min/{1.73_m2} (ref >=60)

## 2021-12-06 LAB — CBC WITH DIFFERENTIAL/PLATELET
Absolute Monocytes: 489 cells/uL (ref 200–950)
Basophils Absolute: 42 cells/uL (ref 0–200)
Basophils Relative: 0.9 %
Eosinophils Absolute: 291 cells/uL (ref 15–500)
Eosinophils Relative: 6.2 %
HCT: 48.7 % (ref 38.5–50.0)
Hemoglobin: 16.2 g/dL (ref 13.2–17.1)
Lymphs Abs: 1241 cells/uL (ref 850–3900)
MCH: 31.6 pg (ref 27.0–33.0)
MCHC: 33.3 g/dL (ref 32.0–36.0)
MCV: 94.9 fL (ref 80.0–100.0)
MPV: 9.2 fL (ref 7.5–12.5)
Monocytes Relative: 10.4 %
Neutro Abs: 2637 cells/uL (ref 1500–7800)
Neutrophils Relative %: 56.1 %
Platelets: 261 10*3/uL (ref 140–400)
RBC: 5.13 10*6/uL (ref 4.20–5.80)
RDW: 12.4 % (ref 11.0–15.0)
Total Lymphocyte: 26.4 %
WBC: 4.7 10*3/uL (ref 3.8–10.8)

## 2021-12-06 LAB — VITAMIN D 25 HYDROXY (VIT D DEFICIENCY, FRACTURES): Vit D, 25-Hydroxy: 50 ng/mL (ref 30–100)

## 2021-12-06 LAB — C-REACTIVE PROTEIN: CRP: <0.2 mg/L (ref <8.0)

## 2021-12-06 LAB — TSH: TSH: 0.67 mIU/L (ref 0.40–4.50)

## 2021-12-06 LAB — SEDIMENTATION RATE: Sed Rate: 2 mm/h (ref 0–20)

## 2021-12-20 ENCOUNTER — Ambulatory Visit: Payer: Medicare Other | Admitting: Cardiology

## 2021-12-27 ENCOUNTER — Ambulatory Visit: Payer: Medicare Other | Admitting: Dermatology

## 2022-01-10 ENCOUNTER — Other Ambulatory Visit: Payer: Self-pay | Admitting: Nurse Practitioner

## 2022-01-10 DIAGNOSIS — J302 Other seasonal allergic rhinitis: Secondary | ICD-10-CM

## 2022-01-10 NOTE — Telephone Encounter (Signed)
Requested Prescriptions  Pending Prescriptions Disp Refills  . 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 [facility])   [facility] [place], Drue Stager, MD   7 months ago Major depression in remission [facility])   [facility] [name], MD   10 months ago 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 [facility])   [facility] [name], MD   1 year ago 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

## 2022-01-27 ENCOUNTER — Other Ambulatory Visit: Payer: Self-pay | Admitting: Urology

## 2022-02-10 ENCOUNTER — Other Ambulatory Visit: Payer: Self-pay | Admitting: Family Medicine

## 2022-02-10 DIAGNOSIS — F5101 Primary insomnia: Secondary | ICD-10-CM

## 2022-03-04 ENCOUNTER — Encounter: Payer: Self-pay | Admitting: Family Medicine

## 2022-03-08 NOTE — Progress Notes (Unsigned)
Name: David Skinner   MRN: [id number]    DOB: 1950-11-17   Date:03/11/2022       Progress Note  Subjective  Chief Complaint  Follow Up  Interpreter came in with patient : David Skinner   HPI  Malnutrition: he had  lost 10 lbs in the first 6 months of 2023  that is 7.5 % weight  He continues to have lack of appetite.  He has noticed a change in bowel initially from constipation to diarrhea, and now having [place] 5 about 4-5 times a day . Associated with cramping before bowel movements. Not sure about mucus but has blood . He is not sure what triggered the change. I will check stools for infection since he went to [place] about one year ago, and refer back to GI   Major depression recurrent : going on for years but  worse in  2018, he is on Cymbalta since April 2018, he states he seems to be feeling well, he needs refills of his medicaitons. He states he feels happy   Hyperlipidemia :he is taking Atorvastatin  80 mg daily No myalgia or chest pain. Last LDL went up from 59 to 80 , he states compliant with medication    OSA/minimal/RLS: he sees Dr Manuella Ghazi for memory loss and had sleep study 2018 it showed minimal sleep apnea and RLS - seeing Dr. Manuella Ghazi.  HTN: BP is at goal, no chest pain or palpitation Continue ARB  Alcoholism : he used to drink heavy while in [place] used to drink liquor at least 3 times a week and used to get drunk, when he moved to [place] ( 1979) . He was still drinking in [place] usually beer 2017. He states he started drinking alcohol again, but no longer hard licquor just a few beers a couple of times a week.    Urinary frequency: under the care of Urologist, taking Urolxatral and also Rapaflo. Advised to bring all his medications to every visit for medical reconciliation    Partial Seizure: Last episode 02/2016 and went to [facility],taking medication daily now and is seeing Dr. Manuella Ghazi. No recent episodes, reminded him alcohol can trigger seizures a, but he states only drinking occasionally  now    Diabetes Type 2 diet controlled with ED, hgbA1C had gone up to 6.9% and we started Metformin back in 10/2015 but we stopped medication months ago due to diarrhea - he is not sure if taking . Marland Kitchen He  denies polyphagia or polydipsia, has urinary frequency due to LUTS  Continues statin therapy. 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 01/09/2016   Tinea cruris 03/21/2015   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 ([place]) 03/09/2015   Chronic constipation 11/28/2014   Insomnia 11/28/2014   Dyslipidemia 11/28/2014   Fatty infiltration of liver 11/28/2014   Essential (primary) hypertension 11/28/2014   Gastric reflux 11/28/2014   Memory loss or impairment 11/28/2014   Perennial allergic rhinitis with seasonal  variation 11/28/2014   Vitamin D deficiency 11/28/2014   Tenosynovitis of thumb 11/28/2014   Partial epilepsy with impairment of consciousness ([place]) 11/16/2014    Past Surgical History:  Procedure Laterality Date   COLONOSCOPY WITH PROPOFOL N/A 03/25/2017   Procedure: COLONOSCOPY WITH PROPOFOL;  Surgeon: Lin Landsman, MD;  Location: [facility];  Service: Endoscopy;  Laterality: N/A;   COLONOSCOPY WITH PROPOFOL N/A 01/16/2021   Procedure: COLONOSCOPY WITH PROPOFOL;  Surgeon: [name], MD;  Location: [facility] ENDOSCOPY;  Service: Gastroenterology;  Laterality: N/A;  USE [place] VIDEO INTERPRETER    ESOPHAGOGASTRODUODENOSCOPY N/A 03/25/2017   Procedure: ESOPHAGOGASTRODUODENOSCOPY (EGD);  Surgeon: Lin Landsman, MD;  Location: [place] [facility];  Service: Endoscopy;  Laterality: N/A;  Diabetic - oral meds   LASIK Bilateral     Family History  Problem Relation Age of Onset   Diabetes Mother    Heart disease Mother     Social History   Tobacco Use   Smoking status: Former    Packs/day: 1.00    Years: 15.00    Total pack years: 15.00    Types: Cigarettes    Start date: 06/17/1984    Quit date: 11/28/1999    Years since quitting: 22.2   Smokeless tobacco: Never  Substance Use Topics   Alcohol use: Yes    Alcohol/week: 1.0 standard drink of alcohol    Types: 1 Cans of beer per week    Comment: occassional beer     Current Outpatient Medications:    albuterol (VENTOLIN HFA) 108 (90 Base) MCG/ACT inhaler, TAKE 2 PUFFS BY MOUTH EVERY 6 HOURS AS NEEDED FOR WHEEZE OR SHORTNESS OF BREATH, Disp: 18 g, Rfl: 0   alfuzosin (UROXATRAL) 10 MG 24 hr tablet, Take 1 tablet (10 mg total) by mouth daily., Disp: 90 tablet, Rfl: 3   aspirin EC 81 MG tablet, Take 1 tablet (81 mg total) by mouth daily., Disp: 30 tablet, Rfl: 0   atorvastatin (LIPITOR) 80 MG tablet, TAKE 1 TABLET BY MOUTH EVERY DAY, Disp: 90 tablet, Rfl: 3   carboxymethylcellulose (REFRESH PLUS) 0.5 % SOLN, Place 1 drop into both eyes 3 (three) times daily as needed., Disp: , Rfl:    clobetasol (TEMOVATE) 0.05 % external solution, Apply 1 application. topically See admin instructions. Apply topically to aa's of scalp nightly 5 days weekly.  Avoid applying to face, groin, and axilla. Use as directed., Disp: 50 mL, Rfl: 2   donepezil (ARICEPT) 10 MG tablet, Take 10 mg by mouth at bedtime., Disp: , Rfl:    doxycycline (VIBRAMYCIN) 50 MG capsule, Take 2 capsules po every evening with food., Disp: 60 capsule, Rfl: 2   DULoxetine (CYMBALTA) 60 MG capsule, TAKE 1 CAPSULE BY MOUTH EVERY DAY, Disp: 90 capsule, Rfl: 1   fluticasone  (FLONASE) 50 MCG/ACT nasal spray, SPRAY 2 SPRAYS INTO EACH NOSTRIL EVERY DAY, Disp: 48 mL, Rfl: 1   ketoconazole (NIZORAL) 2 % shampoo, Shampoo scalp 3 times a week, let sit 10 minutes and rinse out, Disp: 120 mL, Rfl: 6   loratadine (CLARITIN) 10 MG tablet, Take 1 tablet (10 mg total) by mouth 2 (two) times daily., Disp: 180 tablet, Rfl: 1   memantine (NAMENDA) 5 MG tablet, Take 5 mg by mouth 2 (two) times daily., Disp: , Rfl:    olopatadine (PATANOL) 0.1 % ophthalmic solution, Place 1 drop into both eyes 2 (two) times daily., Disp: 5 mL, Rfl: 2   silodosin (RAPAFLO) 8 MG CAPS capsule, Take 8 mg  by mouth daily with breakfast., Disp: , Rfl:    tadalafil (CIALIS) 20 MG tablet, 0.5-1 tab p.o. 1 hour prior to intercourse, Disp: 30 tablet, Rfl: 3   traZODone (DESYREL) 100 MG tablet, TAKE 1 TABLET AT BEDTIME, Disp: 90 tablet, Rfl: 0   Vibegron (GEMTESA) 75 MG TABS, Take 75 mg by mouth daily., Disp: 28 tablet, Rfl: 0   losartan (COZAAR) 25 MG tablet, Take 1 tablet (25 mg total) by mouth daily., Disp: 90 tablet, Rfl: 1  Allergies  Allergen Reactions   Ibuprofen Itching   Levofloxacin    Lisinopril Cough   Metoprolol    Nsaids Itching   Tolmetin Itching   Tramadol Hcl Itching   Tylenol [Acetaminophen] Itching    I personally reviewed active problem list, medication list, allergies, family history, social history, health maintenance with the patient/caregiver today.   ROS  Constitutional: Negative for fever or weight change.  Respiratory: Negative for cough and shortness of breath.   Cardiovascular: Negative for chest pain or palpitations.  Gastrointestinal: positive for abdominal pain, no bowel changes.  Musculoskeletal: Negative for gait problem or joint swelling.  Skin: Negative for rash.  Neurological: Negative for dizziness or headache.  No other specific complaints in a complete review of systems (except as listed in HPI above).   Objective  Vitals:   03/11/22 1450 03/11/22 1527   BP: 118/72   Pulse: (!) 103 80  Resp: 16   Temp: 98 F (36.7 C)   SpO2: 94%   Weight: 147 lb 1.6 oz (66.7 kg)   Height: '5\' 5"'$  (1.651 m)     Body mass index is 24.48 kg/m.  Physical Exam  Constitutional: Patient appears well-developed and well-nourished.  No distress.  HEENT: head atraumatic, normocephalic, pupils equal and reactive to light, neck supple Cardiovascular: Normal rate, regular rhythm and normal heart sounds.  No murmur heard. No BLE edema. Pulmonary/Chest: Effort normal and breath sounds normal. No respiratory distress. Abdominal: Soft.  There is no tenderness. Psychiatric: Patient has a normal mood and affect. behavior is normal. Judgment and thought content normal.   Recent Results (from the past 2160 hour(s))  POCT HgB A1C     Status: Abnormal   Collection Time: 03/11/22  2:53 PM  Result Value Ref Range   Hemoglobin A1C 5.7 (A) 4.0 - 5.6 %   HbA1c POC (<> result, manual entry)     HbA1c, POC (prediabetic range)     HbA1c, POC (controlled diabetic range)      Diabetic Foot Exam: Diabetic Foot Exam - Simple   Simple Foot Form Visual Inspection No deformities, no ulcerations, no other skin breakdown bilaterally: Yes Sensation Testing Intact to touch and monofilament testing bilaterally: Yes Pulse Check Posterior Tibialis and Dorsalis pulse intact bilaterally: Yes Comments      PHQ2/9:    03/11/2022    2:51 PM 12/05/2021    8:47 AM 06/19/2021    8:50 AM 06/06/2021   10:08 AM 03/07/2021    8:12 AM  Depression screen PHQ 2/9  Decreased Interest 0 0 0 0 0  Down, Depressed, Hopeless 0 0 0 0 0  PHQ - 2 Score 0 0 0 0 0  Altered sleeping 0 0 0 0   Tired, decreased energy 0 1 0 0   Change in appetite 0 '3 1 1   '$ Feeling bad or failure about yourself  0 0 0 0   Trouble concentrating 0 0 1 0   Moving slowly or fidgety/restless 0 0  0 0   Suicidal thoughts 0 0 0 0   PHQ-9 Score 0 '4 2 1   '$ Difficult doing work/chores Not difficult at all        phq 9 is  negative   Fall Risk:    03/11/2022    2:51 PM 12/05/2021    8:47 AM 06/19/2021    9:03 AM 06/06/2021   10:08 AM 03/07/2021    8:12 AM  Fall Risk   Falls in the past year? 0 0 0 0 0  Number falls in past yr: 0 0 0 0   Injury with Fall? 0 0 0 0   Risk for fall due to :  No Fall Risks No Fall Risks No Fall Risks   Follow up  Falls prevention discussed Falls prevention discussed Falls prevention discussed Falls prevention discussed      Functional Status Survey: Is the patient deaf or have difficulty hearing?: No Does the patient have difficulty seeing, even when wearing glasses/contacts?: No Does the patient have difficulty concentrating, remembering, or making decisions?: No Does the patient have difficulty walking or climbing stairs?: No Does the patient have difficulty dressing or bathing?: No Does the patient have difficulty doing errands alone such as visiting a doctor's office or shopping?: No    Assessment & Plan  1. 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 ratio  2. Need for influenza vaccination  - Flu Vaccine QUAD High Dose(Fluad)  3. Chronic diarrhea  - GI pathogen panel by PCR, stool - Ambulatory referral to Gastroenterology  4. Major depression in remission ([place])   5. OSA (obstructive sleep apnea)   6. Essential (primary) hypertension  - losartan (COZAAR) 25 MG tablet; Take 1 tablet (25 mg total) by mouth daily.  Dispense: 90 tablet; Refill: 1  7. Mild protein-calorie malnutrition ([place])  - GI pathogen panel by PCR, stool

## 2022-03-11 ENCOUNTER — Encounter: Payer: Self-pay | Admitting: Urology

## 2022-03-11 ENCOUNTER — Ambulatory Visit (INDEPENDENT_AMBULATORY_CARE_PROVIDER_SITE_OTHER): Payer: Medicare Other | Admitting: Family Medicine

## 2022-03-11 ENCOUNTER — Encounter: Payer: Self-pay | Admitting: Family Medicine

## 2022-03-11 VITALS — BP 118/72 | HR 80 | Temp 98.0°F | Resp 16 | Ht 65.0 in | Wt 147.1 lb

## 2022-03-11 DIAGNOSIS — Z23 Encounter for immunization: Secondary | ICD-10-CM | POA: Diagnosis not present

## 2022-03-11 DIAGNOSIS — E441 Mild protein-calorie malnutrition: Secondary | ICD-10-CM

## 2022-03-11 DIAGNOSIS — N521 Erectile dysfunction due to diseases classified elsewhere: Secondary | ICD-10-CM

## 2022-03-11 DIAGNOSIS — E1140 Type 2 diabetes mellitus with diabetic neuropathy, unspecified: Secondary | ICD-10-CM | POA: Diagnosis not present

## 2022-03-11 DIAGNOSIS — I10 Essential (primary) hypertension: Secondary | ICD-10-CM

## 2022-03-11 DIAGNOSIS — G4733 Obstructive sleep apnea (adult) (pediatric): Secondary | ICD-10-CM | POA: Diagnosis not present

## 2022-03-11 DIAGNOSIS — F325 Major depressive disorder, single episode, in full remission: Secondary | ICD-10-CM | POA: Diagnosis not present

## 2022-03-11 DIAGNOSIS — K529 Noninfective gastroenteritis and colitis, unspecified: Secondary | ICD-10-CM

## 2022-03-11 LAB — POCT GLYCOSYLATED HEMOGLOBIN (HGB A1C): Hemoglobin A1C: 5.7 % — AB (ref 4.0–5.6)

## 2022-03-11 MED ORDER — LOSARTAN POTASSIUM 25 MG PO TABS: 25.0000 mg | ORAL_TABLET | Freq: Every day | ORAL | 1 refills | Status: DC

## 2022-03-11 NOTE — Patient Instructions (Signed)
Ngung. Kong uong thouc tieuduong/metformin

## 2022-03-12 DIAGNOSIS — N521 Erectile dysfunction due to diseases classified elsewhere: Secondary | ICD-10-CM | POA: Diagnosis not present

## 2022-03-12 DIAGNOSIS — E1140 Type 2 diabetes mellitus with diabetic neuropathy, unspecified: Secondary | ICD-10-CM | POA: Diagnosis not present

## 2022-03-12 DIAGNOSIS — K529 Noninfective gastroenteritis and colitis, unspecified: Secondary | ICD-10-CM | POA: Diagnosis not present

## 2022-03-12 DIAGNOSIS — R109 Unspecified abdominal pain: Secondary | ICD-10-CM | POA: Diagnosis not present

## 2022-03-12 DIAGNOSIS — E441 Mild protein-calorie malnutrition: Secondary | ICD-10-CM | POA: Diagnosis not present

## 2022-03-13 LAB — MICROALBUMIN / CREATININE URINE RATIO
Creatinine, Urine: 257 mg/dL (ref 20–320)
Microalb Creat Ratio: 13 mcg/mg creat (ref <30)
Microalb, Ur: 3.4 mg/dL

## 2022-03-14 LAB — GASTROINTESTINAL PATHOGEN PNL
CampyloBacter Group: NOT DETECTED
Norovirus GI/GII: NOT DETECTED
Rotavirus A: NOT DETECTED
Salmonella species: NOT DETECTED
Shiga Toxin 1: NOT DETECTED
Shiga Toxin 2: NOT DETECTED
Shigella Species: NOT DETECTED
Vibrio Group: NOT DETECTED
Yersinia enterocolitica: NOT DETECTED

## 2022-03-20 ENCOUNTER — Other Ambulatory Visit: Payer: Self-pay

## 2022-03-20 DIAGNOSIS — R109 Unspecified abdominal pain: Secondary | ICD-10-CM

## 2022-04-04 ENCOUNTER — Ambulatory Visit: Payer: Medicare Other | Admitting: Gastroenterology

## 2022-04-16 ENCOUNTER — Ambulatory Visit: Payer: Medicare Other | Admitting: Gastroenterology

## 2022-04-18 ENCOUNTER — Ambulatory Visit (INDEPENDENT_AMBULATORY_CARE_PROVIDER_SITE_OTHER): Payer: Medicare Other | Admitting: Gastroenterology

## 2022-04-18 ENCOUNTER — Encounter: Payer: Self-pay | Admitting: Gastroenterology

## 2022-04-18 VITALS — BP 105/71 | HR 91 | Temp 98.3°F | Wt 151.0 lb

## 2022-04-18 DIAGNOSIS — R197 Diarrhea, unspecified: Secondary | ICD-10-CM

## 2022-04-18 NOTE — Progress Notes (Signed)
David Bellows MD, MRCP(U.K) [address]  [place]  [place], [place] [address]  Main: [phone]  Fax: [phone]   Primary Care Physician: [name], MD  Primary Gastroenterologist:  Dr. Jonathon Skinner   Chief Complaint  Patient presents with   Anorexia    HPI: David Skinner is a 71 y.o. male   Summary of history :  Initially referred and seen in 12/2020 for change in bowel habits with rectal bleeding.    Interval history   01/03/2021-04/18/2022  01/16/2021: Colonoscopy : 4 sessile diminutive polyps resected. Adenoma x2  Today referred to see me for Diarrhea with blood and mucus going on for over 6 months with weight loss .  He states the main reason he is here to see me is for nonbloody diarrhea.  He denies any blood in his stool.  Says has multiple bowel movements per day going on for the past 4 months.  He says he drinks 5 to 6 cans of soda per day Per our office records he weighs exactly the same at 68 kg since his last visit in July 2022.  There is no evidence of weight loss per our records.   Current Outpatient Medications  Medication Sig Dispense Refill   albuterol (VENTOLIN HFA) 108 (90 Base) MCG/ACT inhaler TAKE 2 PUFFS BY MOUTH EVERY 6 HOURS AS NEEDED FOR WHEEZE OR SHORTNESS OF BREATH 18 g 0   alfuzosin (UROXATRAL) 10 MG 24 hr tablet Take 1 tablet (10 mg total) by mouth daily. 90 tablet 3   aspirin EC 81 MG tablet Take 1 tablet (81 mg total) by mouth daily. 30 tablet 0   atorvastatin (LIPITOR) 80 MG tablet TAKE 1 TABLET BY MOUTH EVERY DAY 90 tablet 3   carboxymethylcellulose (REFRESH PLUS) 0.5 % SOLN Place 1 drop into both eyes 3 (three) times daily as needed.     clobetasol (TEMOVATE) 0.05 % external solution Apply 1 application. topically See admin instructions. Apply topically to aa's of scalp nightly 5 days weekly.  Avoid applying to face, groin, and axilla. Use as directed. 50 mL 2   donepezil (ARICEPT) 10 MG tablet Take 10 mg by mouth at bedtime.      doxycycline (VIBRAMYCIN) 50 MG capsule Take 2 capsules po every evening with food. 60 capsule 2   DULoxetine (CYMBALTA) 60 MG capsule TAKE 1 CAPSULE BY MOUTH EVERY DAY 90 capsule 1   fluticasone (FLONASE) 50 MCG/ACT nasal spray SPRAY 2 SPRAYS INTO EACH NOSTRIL EVERY DAY 48 mL 1   ketoconazole (NIZORAL) 2 % shampoo Shampoo scalp 3 times a week, let sit 10 minutes and rinse out 120 mL 6   loratadine (CLARITIN) 10 MG tablet Take 1 tablet (10 mg total) by mouth 2 (two) times daily. 180 tablet 1   losartan (COZAAR) 25 MG tablet Take 1 tablet (25 mg total) by mouth daily. 90 tablet 1   memantine (NAMENDA) 5 MG tablet Take 5 mg by mouth 2 (two) times daily.     olopatadine (PATANOL) 0.1 % ophthalmic solution Place 1 drop into both eyes 2 (two) times daily. 5 mL 2   silodosin (RAPAFLO) 8 MG CAPS capsule Take 8 mg by mouth daily with breakfast.     tadalafil (CIALIS) 20 MG tablet 0.5-1 tab p.o. 1 hour prior to intercourse 30 tablet 3   traZODone (DESYREL) 100 MG tablet TAKE 1 TABLET AT BEDTIME 90 tablet 0   Vibegron (GEMTESA) 75 MG TABS Take 75 mg by mouth daily.  28 tablet 0   No current facility-administered medications for this visit.    Allergies as of 04/18/2022 - Review Complete 04/18/2022  Allergen Reaction Noted   Ibuprofen Itching 11/28/2014   Levofloxacin  11/28/2014   Lisinopril Cough 11/28/2014   Metoprolol  11/28/2014   Nsaids Itching 03/08/2015   Tolmetin Itching 03/08/2015   Tramadol hcl Itching 11/28/2014   Tylenol [acetaminophen] Itching 05/18/2020    ROS:  General: Negative for anorexia, weight loss, fever, chills, fatigue, weakness. ENT: Negative for hoarseness, difficulty swallowing , nasal congestion. CV: Negative for chest pain, angina, palpitations, dyspnea on exertion, peripheral edema.  Respiratory: Negative for dyspnea at rest, dyspnea on exertion, cough, sputum, wheezing.  GI: See history of present illness. GU:  Negative for dysuria, hematuria, urinary  incontinence, urinary frequency, nocturnal urination.  Endo: Negative for unusual weight change.    Physical Examination:   BP 105/71   Pulse 91   Temp 98.3 F (36.8 C) (Oral)   Wt 151 lb (68.5 kg)   BMI 25.13 kg/m   General: Well-nourished, well-developed in no acute distress.  Eyes: No icterus. Conjunctivae pink. Neuro: Alert and oriented x 3.  Grossly intact. Skin: Warm and dry, no jaundice.   Psych: Alert and cooperative, normal mood and affect.   Imaging Studies: No results found.  Assessment and Plan:   David Skinner is a 71 y.o. y/o male here to see me for new onset diarrhea going on for 4 months.  Although his referral letter stated weight loss per our records he weighs exactly 68 kg as he did in July 2022.  His diarrhea is nonbloody.   He does consume 5 cans of soda per day it is possible he has fructose intolerance which may be causing adiarrhea other differentials include Aricept induced diarrhea versus microscopic colitis related to duloxetine.  He has had a GI PCR but C. difficile has not been ruled out we will evaluate for C. difficile advised him to stop consuming sodas for a week, check his labs.  He will send me a message in 1 week, if he is better no further evaluation will be performed if he is not better he will need a colonoscopy with biopsies.    Dr David Bellows  MD,MRCP [facility]) Follow up in as needed

## 2022-04-18 NOTE — Addendum Note (Signed)
Addended by: Wayna Chalet on: 04/18/2022 02:58 PM   Modules accepted: Orders

## 2022-04-18 NOTE — Patient Instructions (Addendum)
Please STOP drinking sodas of any kind.  Please return your stool tests.

## 2022-04-19 LAB — COMPREHENSIVE METABOLIC PANEL WITH GFR
ALT: 31 IU/L (ref 0–44)
AST: 22 IU/L (ref 0–40)
Albumin/Globulin Ratio: 1.8 (ref 1.2–2.2)
Albumin: 4.2 g/dL (ref 3.9–4.9)
Alkaline Phosphatase: 75 IU/L (ref 44–121)
BUN/Creatinine Ratio: 17 (ref 10–24)
BUN: 17 mg/dL (ref 8–27)
Bilirubin Total: 0.5 mg/dL (ref 0.0–1.2)
CO2: 23 mmol/L (ref 20–29)
Calcium: 9.3 mg/dL (ref 8.6–10.2)
Chloride: 105 mmol/L (ref 96–106)
Creatinine, Ser: 0.99 mg/dL (ref 0.76–1.27)
Globulin, Total: 2.4 g/dL (ref 1.5–4.5)
Glucose: 95 mg/dL (ref 70–99)
Potassium: 4.2 mmol/L (ref 3.5–5.2)
Sodium: 142 mmol/L (ref 134–144)
Total Protein: 6.6 g/dL (ref 6.0–8.5)
eGFR: 82 mL/min/1.73 (ref >59)

## 2022-04-19 LAB — CBC WITH DIFFERENTIAL/PLATELET
Basophils Absolute: 0 10*3/uL (ref 0.0–0.2)
Basos: 1 %
EOS (ABSOLUTE): 0.3 10*3/uL (ref 0.0–0.4)
Eos: 5 %
Hematocrit: 43.2 % (ref 37.5–51.0)
Hemoglobin: 14.9 g/dL (ref 13.0–17.7)
Immature Grans (Abs): 0 10*3/uL (ref 0.0–0.1)
Immature Granulocytes: 0 %
Lymphocytes Absolute: 1.5 10*3/uL (ref 0.7–3.1)
Lymphs: 26 %
MCH: 32.1 pg (ref 26.6–33.0)
MCHC: 34.5 g/dL (ref 31.5–35.7)
MCV: 93 fL (ref 79–97)
Monocytes Absolute: 0.7 10*3/uL (ref 0.1–0.9)
Monocytes: 13 %
Neutrophils Absolute: 3.1 10*3/uL (ref 1.4–7.0)
Neutrophils: 55 %
Platelets: 233 10*3/uL (ref 150–450)
RBC: 4.64 x10E6/uL (ref 4.14–5.80)
RDW: 12.8 % (ref 11.6–15.4)
WBC: 5.6 10*3/uL (ref 3.4–10.8)

## 2022-04-19 LAB — C-REACTIVE PROTEIN: CRP: <1 mg/L (ref 0–10)

## 2022-04-22 ENCOUNTER — Ambulatory Visit: Payer: Medicare Other | Admitting: Dermatology

## 2022-04-23 ENCOUNTER — Other Ambulatory Visit: Payer: Self-pay | Admitting: Family Medicine

## 2022-04-23 DIAGNOSIS — R197 Diarrhea, unspecified: Secondary | ICD-10-CM | POA: Diagnosis not present

## 2022-04-23 DIAGNOSIS — I10 Essential (primary) hypertension: Secondary | ICD-10-CM

## 2022-04-24 ENCOUNTER — Other Ambulatory Visit: Payer: Self-pay | Admitting: Dermatology

## 2022-04-24 DIAGNOSIS — L739 Follicular disorder, unspecified: Secondary | ICD-10-CM

## 2022-04-25 ENCOUNTER — Telehealth: Payer: Self-pay

## 2022-04-25 NOTE — Telephone Encounter (Signed)
Called patient and he did not answer so I left him a voicemail letting him know that we would want for him to turn in his stool samples and to call me back if he still had abdominal issues to notify the physician.

## 2022-04-25 NOTE — Telephone Encounter (Signed)
-----   Message from [place], [place] sent at 04/18/2022  3:06 PM EDT ----- Regarding: follow up call Call patient and ask if he is feeling better and check to see if he brought back his stool samples and if results are in. Then let Dr. Vicente Males know.

## 2022-04-28 LAB — CALPROTECTIN, FECAL: Calprotectin, Fecal: 15 ug/g (ref 0–120)

## 2022-04-29 NOTE — Telephone Encounter (Signed)
Patient turned in his stool samples and they were normal.

## 2022-04-30 NOTE — Telephone Encounter (Signed)
Stool tests are normal.  Inquire with the patient if still having symptoms if still having symptoms we will decide on the next step

## 2022-04-30 NOTE — Telephone Encounter (Signed)
Called patient but left him a voicemail letting him know that his stools are normal but to give [place] a call back if he continues to have the same symptoms as when he came in.

## 2022-05-03 NOTE — Telephone Encounter (Signed)
Called patient one more time and he dd not pick up his phone. I left him a detailed message letting him know that his stool test was normal and if he continued to have the same symptoms as before, to please give [place] a call.

## 2022-05-16 ENCOUNTER — Other Ambulatory Visit: Payer: Self-pay | Admitting: Family Medicine

## 2022-05-16 ENCOUNTER — Other Ambulatory Visit: Payer: Self-pay

## 2022-05-16 DIAGNOSIS — E785 Hyperlipidemia, unspecified: Secondary | ICD-10-CM

## 2022-05-16 DIAGNOSIS — L739 Follicular disorder, unspecified: Secondary | ICD-10-CM

## 2022-05-16 MED ORDER — DOXYCYCLINE HYCLATE 50 MG PO CAPS: ORAL_CAPSULE | ORAL | 0 refills | Status: DC

## 2022-05-16 NOTE — Progress Notes (Signed)
Doxycycline RF request before patient goes out of town. aw

## 2022-05-29 ENCOUNTER — Ambulatory Visit: Payer: Medicare Other | Admitting: Dermatology

## 2022-06-14 ENCOUNTER — Other Ambulatory Visit: Payer: Self-pay | Admitting: Dermatology

## 2022-06-14 DIAGNOSIS — L739 Follicular disorder, unspecified: Secondary | ICD-10-CM

## 2022-06-17 ENCOUNTER — Other Ambulatory Visit: Payer: Self-pay | Admitting: Family Medicine

## 2022-06-17 DIAGNOSIS — F5101 Primary insomnia: Secondary | ICD-10-CM

## 2022-07-15 ENCOUNTER — Ambulatory Visit: Payer: Medicare Other | Admitting: Family Medicine

## 2022-07-30 DIAGNOSIS — E119 Type 2 diabetes mellitus without complications: Secondary | ICD-10-CM | POA: Diagnosis not present

## 2022-07-30 DIAGNOSIS — Z0100 Encounter for examination of eyes and vision without abnormal findings: Secondary | ICD-10-CM | POA: Diagnosis not present

## 2022-07-30 DIAGNOSIS — H2513 Age-related nuclear cataract, bilateral: Secondary | ICD-10-CM | POA: Diagnosis not present

## 2022-07-30 DIAGNOSIS — H40003 Preglaucoma, unspecified, bilateral: Secondary | ICD-10-CM | POA: Diagnosis not present

## 2022-07-30 LAB — HM DIABETES EYE EXAM

## 2022-08-06 NOTE — Progress Notes (Unsigned)
Name: David Skinner   MRN: [id number]    DOB: April 19, 1951   Date:08/07/2022       Progress Note  Subjective  Chief Complaint  Follow Up  Interpreter present during visit   HPI  Diarrhea: he developed symptoms last Spring, he saw Dr. Wilhemena Durie and was advised to stop drinking regular sodas and checked stools for PCR. Studies negative and advised to go back if no improvement of symptoms. His weight is up today. He stopped drinking sodas and only drinks sweet tea occasionally,  but still has 5-6 bowel movements per day bristol 5 , per note he was supposed to go back if no improvement of symptoms   Major depression  in remission : going on for years but  worse in  2018, he was on Cymbalta since April 2018, he states he seems to be feeling well, we will send a refill of medication   Hyperlipidemia :he is taking Atorvastatin  80 mg daily No myalgia or chest pain. Last LDL went up from 59 to 80 , he states compliant with medication    OSA/minimal/RLS/Memory loss :he had a  sleep study back in 2018 it showed minimal sleep apnea and RLS - he lost to follow up with Dr. Manuella Ghazi ( neurologist ) and is not longer taking Aricept and Namenda . Advised him to contact his office for follow up.   HTN: BP is slightly above his bp, he takes losartan 25 mg daily . He denies side effects, he states he is compliant   Alcoholism : he used to drink heavy while in [place] used to drink liquor at least 3 times a week and used to get drunk, when he moved to [place] ( 1979) . He was still drinking in [place] usually beer 2017. He states he started drinking alcohol again, he states currently only drinking cognac about one quarter of a bottle without ice . Discussed risk of alcohol use with his chronic medical problems.    Urinary frequency: under the care of Urologist, he was given  Urolxatral and also Rapaflo. He is not sure what taking right now , needs to follow up with urologist   Partial Seizure: Last episode 02/2016 and went to [facility],  he was taking medication given by Dr. Manuella Ghazi but currently not taking anything, advised to follow up with Dr. 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 He  denies polyphagia or polydipsia, has urinary frequency due to LUTS  Continues statin therapy. A1C today is 6.6 %. He is on diet only at this time. He has dyslipidemia and takes statin therapy    Insomnia: he sleeps well with Trazodone, no side effects , he needs a refill , he states it works well for him   Seborrheic dermatitis: seen by Dr. Nehemiah Massed and is not sure if taking antibiotics, he went to [place] last Fall and developed a rash on both sides of his face, it was not painful and still itches, now it is dark, advised to follow up with Dr. 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) 03/09/2015   Chronic constipation 11/28/2014   Insomnia 11/28/2014   Dyslipidemia 11/28/2014   Fatty infiltration of liver 11/28/2014   Essential (primary) hypertension 11/28/2014   Gastric reflux 11/28/2014   Memory loss or impairment 11/28/2014   Perennial allergic rhinitis with seasonal variation 11/28/2014   Vitamin D deficiency 11/28/2014   Tenosynovitis of thumb 11/28/2014   Partial epilepsy with impairment of consciousness ([place]) 11/16/2014    Past Surgical History:  Procedure  Laterality Date   COLONOSCOPY WITH PROPOFOL N/A 03/25/2017   Procedure: COLONOSCOPY WITH PROPOFOL;  Surgeon: Lin Landsman, MD;  Location: [facility];  Service: Endoscopy;  Laterality: N/A;   COLONOSCOPY WITH PROPOFOL N/A 01/16/2021   Procedure: COLONOSCOPY WITH PROPOFOL;  Surgeon: [name], MD;  Location: [facility] ENDOSCOPY;  Service: Gastroenterology;  Laterality: N/A;  USE [place]ET[place]ESE VIDEO INTERPRETER   ESOPHAGOGASTRODUODENOSCOPY N/A 03/25/2017   Procedure: ESOPHAGOGASTRODUODENOSCOPY (EGD);  Surgeon: Lin Landsman, MD;  Location: [place];  Service: Endoscopy;  Laterality: N/A;  Diabetic - oral meds   LASIK Bilateral     Family History  Problem Relation Age of Onset   Diabetes Mother    Heart disease Mother     Social History   Tobacco Use   Smoking status: Former    Packs/day: 1.00    Years: 15.00    Total pack years: 15.00    Types: Cigarettes    Start date: 06/17/1984    Quit date: 11/28/1999    Years since quitting: 22.7   Smokeless tobacco: Never  Substance Use Topics   Alcohol use: Yes    Alcohol/week: 1.0 standard drink of alcohol    Types: 1 Cans of beer per week    Comment: occassional beer     Current Outpatient Medications:    albuterol (VENTOLIN HFA) 108 (90 Base) MCG/ACT inhaler, TAKE 2 PUFFS BY MOUTH EVERY 6 HOURS AS NEEDED FOR WHEEZE OR SHORTNESS OF BREATH, Disp: 18 g, Rfl: 0   alfuzosin (UROXATRAL) 10 MG 24 hr tablet, Take 1 tablet (10 mg total) by mouth daily., Disp: 90 tablet, Rfl: 3   aspirin EC 81 MG tablet, Take 1 tablet (81 mg total) by mouth daily., Disp: 30 tablet, Rfl: 0   atorvastatin (LIPITOR) 80 MG tablet, TAKE 1 TABLET BY MOUTH EVERY DAY, Disp: 90 tablet, Rfl: 0   carboxymethylcellulose (REFRESH PLUS) 0.5 % SOLN, Place 1 drop into both eyes 3 (three) times daily as needed., Disp: , Rfl:    clobetasol (TEMOVATE) 0.05 % external solution, Apply 1 application. topically See admin instructions. Apply topically to aa's of  scalp nightly 5 days weekly.  Avoid applying to face, groin, and axilla. Use as directed., Disp: 50 mL, Rfl: 2   donepezil (ARICEPT) 10 MG tablet, Take 10 mg by mouth at bedtime., Disp: , Rfl:    DULoxetine (CYMBALTA) 60 MG capsule, TAKE 1 CAPSULE BY MOUTH EVERY DAY, Disp: 90 capsule, Rfl: 1   fluticasone (FLONASE) 50 MCG/ACT nasal spray, SPRAY 2 SPRAYS INTO EACH NOSTRIL EVERY DAY, Disp: 48 mL, Rfl: 1   ketoconazole (NIZORAL) 2 % shampoo, Shampoo scalp 3 times a week, let sit 10 minutes and rinse out, Disp: 120 mL, Rfl: 6   loratadine (CLARITIN) 10 MG tablet, Take 1 tablet (10 mg total) by  mouth 2 (two) times daily., Disp: 180 tablet, Rfl: 1   losartan (COZAAR) 25 MG tablet, TAKE 1 TABLET DAILY, Disp: 90 tablet, Rfl: 1   memantine (NAMENDA) 5 MG tablet, Take 5 mg by mouth 2 (two) times daily., Disp: , Rfl:    olopatadine (PATANOL) 0.1 % ophthalmic solution, Place 1 drop into both eyes 2 (two) times daily., Disp: 5 mL, Rfl: 2   silodosin (RAPAFLO) 8 MG CAPS capsule, Take 8 mg by mouth daily with breakfast., Disp: , Rfl:    tadalafil (CIALIS) 20 MG tablet, 0.5-1 tab p.o. 1 hour prior to intercourse, Disp: 30 tablet, Rfl: 3   traZODone (DESYREL) 100 MG tablet, TAKE 1 TABLET AT BEDTIME, Disp: 90 tablet, Rfl: 0   Vibegron (GEMTESA) 75 MG TABS, Take 75 mg by mouth daily., Disp: 28 tablet, Rfl: 0   doxycycline (VIBRAMYCIN) 50 MG capsule, TAKE 2 CAPSULES BY MOUTH EVERY EVENING WITH FOOD. (Patient not taking: Reported on 08/07/2022), Disp: 60 capsule, Rfl: 0  Allergies  Allergen Reactions   Ibuprofen Itching   Levofloxacin    Lisinopril Cough   Metoprolol    Nsaids Itching   Tolmetin Itching   Tramadol Hcl Itching   Tylenol [Acetaminophen] Itching    I personally reviewed active problem list, medication list, allergies, family history, social history, health maintenance with the patient/caregiver today.   ROS  Constitutional: Negative for fever or weight change.  Respiratory: Negative for  cough and shortness of breath.   Cardiovascular: Negative for chest pain or palpitations.  Gastrointestinal: Negative for abdominal pain, no bowel changes.  Musculoskeletal: Negative for gait problem or joint swelling.  Skin: Negative for rash.  Neurological: Negative for dizziness or headache.  No other specific complaints in a complete review of systems (except as listed in HPI above).   Objective  Vitals:   08/07/22 1055  BP: 138/82  Pulse: 89  Resp: 16  Temp: 97.9 F (36.6 C)  TempSrc: Oral  SpO2: 94%  Weight: 158 lb 11.2 oz (72 kg)  Height: 5' 5"$  (1.651 m)    Body mass index is 26.41 kg/m.  Physical Exam  Constitutional: Patient appears well-developed and well-nourished.  No distress.  HEENT: head atraumatic, normocephalic, pupils equal and reactive to light, neck supple Cardiovascular: Normal rate, regular rhythm and normal heart sounds.  No murmur heard. No BLE edema. Pulmonary/Chest: Effort normal and breath sounds normal. No respiratory distress. Abdominal: Soft.  There is mild diffuse tenderness. Psychiatric: Patient has a normal mood and affect. behavior is normal. Judgment and thought content normal.   Recent Results (from the past 2160 hour(s))  POCT HgB A1C     Status: Abnormal   Collection Time: 08/07/22 11:08 AM  Result Value Ref Range   Hemoglobin A1C 6.6 (A) 4.0 - 5.6 %   HbA1c POC (<> result, manual entry)     HbA1c, POC (prediabetic range)     HbA1c, POC (controlled diabetic range)       PHQ2/9:    08/07/2022   11:06 AM 03/11/2022    2:51 PM 12/05/2021    8:47 AM 06/19/2021    8:50 AM 06/06/2021   10:08 AM  Depression screen PHQ 2/9  Decreased Interest 0 0 0 0 0  Down, Depressed, Hopeless 0 0 0 0 0  PHQ - 2 Score 0 0 0 0 0  Altered sleeping 0 0 0 0 0  Tired, decreased energy 0 0 1 0 0  Change in appetite 0 0 3 1 1  Feeling bad or failure about yourself  0 0 0 0 0  Trouble concentrating 0 0 0 1 0  Moving slowly or fidgety/restless 0 0 0 0 0   Suicidal thoughts 0 0 0 0 0  PHQ-9 Score 0 0 4 2 1  $ Difficult doing work/chores  Not difficult at all       phq 9 is negative   Fall Risk:    08/07/2022   11:06 AM 03/11/2022    2:51 PM 12/05/2021    8:47 AM 06/19/2021    9:03 AM 06/06/2021   10:08 AM  Fall Risk   Falls in the past year? 0 0 0 0 0  Number falls in past yr:  0 0 0 0  Injury with Fall?  0 0 0 0  Risk for fall due to : No Fall Risks  No Fall Risks No Fall Risks No Fall Risks  Follow up Falls prevention discussed  Falls prevention discussed Falls prevention discussed Falls prevention discussed      Functional Status Survey: Is the patient deaf or have difficulty hearing?: Yes Does the patient have difficulty seeing, even when wearing glasses/contacts?: No Does the patient have difficulty concentrating, remembering, or making decisions?: Yes Does the patient have difficulty walking or climbing stairs?: No Does the patient have difficulty dressing or bathing?: No Does the patient have difficulty doing errands alone such as visiting a doctor's office or shopping?: No    Assessment & Plan  1. 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 Alcoholism ([place])  He continues to drink although not daily   3. Memory loss or impairment  Must follow up with neurologist   4. Essential (primary) hypertension  Bp is at goal   5. OSA (obstructive sleep apnea)   6. Primary insomnia  He states Trazodone works well for him  7. Chronic diarrhea  Must follow up with Dr. Vicente Males   8. Dyslipidemia   9. Vitamin D deficiency  Discussed importance of supplementation   10. Seborrhea capitis  Needs to follow up with dermatologist   11. Major depression in remission [facility])  He will return for medication reconciliation with CMA, not sure if currently taking medications

## 2022-08-07 ENCOUNTER — Encounter: Payer: Self-pay | Admitting: Family Medicine

## 2022-08-07 ENCOUNTER — Ambulatory Visit (INDEPENDENT_AMBULATORY_CARE_PROVIDER_SITE_OTHER): Payer: Medicare Other | Admitting: Family Medicine

## 2022-08-07 VITALS — BP 138/82 | HR 89 | Temp 97.9°F | Resp 16 | Ht 65.0 in | Wt 158.7 lb

## 2022-08-07 DIAGNOSIS — K529 Noninfective gastroenteritis and colitis, unspecified: Secondary | ICD-10-CM

## 2022-08-07 DIAGNOSIS — I10 Essential (primary) hypertension: Secondary | ICD-10-CM | POA: Diagnosis not present

## 2022-08-07 DIAGNOSIS — G4733 Obstructive sleep apnea (adult) (pediatric): Secondary | ICD-10-CM

## 2022-08-07 DIAGNOSIS — F1020 Alcohol dependence, uncomplicated: Secondary | ICD-10-CM | POA: Diagnosis not present

## 2022-08-07 DIAGNOSIS — L210 Seborrhea capitis: Secondary | ICD-10-CM

## 2022-08-07 DIAGNOSIS — N521 Erectile dysfunction due to diseases classified elsewhere: Secondary | ICD-10-CM

## 2022-08-07 DIAGNOSIS — R413 Other amnesia: Secondary | ICD-10-CM | POA: Diagnosis not present

## 2022-08-07 DIAGNOSIS — E559 Vitamin D deficiency, unspecified: Secondary | ICD-10-CM

## 2022-08-07 DIAGNOSIS — E1140 Type 2 diabetes mellitus with diabetic neuropathy, unspecified: Secondary | ICD-10-CM | POA: Diagnosis not present

## 2022-08-07 DIAGNOSIS — E785 Hyperlipidemia, unspecified: Secondary | ICD-10-CM

## 2022-08-07 DIAGNOSIS — F5101 Primary insomnia: Secondary | ICD-10-CM

## 2022-08-07 DIAGNOSIS — F325 Major depressive disorder, single episode, in full remission: Secondary | ICD-10-CM

## 2022-08-07 LAB — POCT GLYCOSYLATED HEMOGLOBIN (HGB A1C): Hemoglobin A1C: 6.6 % — AB (ref 4.0–5.6)

## 2022-08-07 NOTE — Patient Instructions (Addendum)
Follow up with Dr. Manuella Ghazi for memory/headache and seizures [phone] Follow up with Dr. Vicente Males - gastroenterologist for diarrhea [phone] Follow up with Dr. Marda Stalker also : [phone]

## 2022-08-09 ENCOUNTER — Ambulatory Visit: Payer: Medicare Other | Admitting: Urology

## 2022-08-11 ENCOUNTER — Other Ambulatory Visit: Payer: Self-pay | Admitting: Family Medicine

## 2022-08-11 DIAGNOSIS — F325 Major depressive disorder, single episode, in full remission: Secondary | ICD-10-CM

## 2022-08-12 ENCOUNTER — Encounter: Payer: Self-pay | Admitting: Dermatology

## 2022-08-12 ENCOUNTER — Other Ambulatory Visit: Payer: Self-pay | Admitting: Family Medicine

## 2022-08-12 ENCOUNTER — Ambulatory Visit (INDEPENDENT_AMBULATORY_CARE_PROVIDER_SITE_OTHER): Payer: Medicare Other | Admitting: Dermatology

## 2022-08-12 VITALS — BP 147/94 | HR 93

## 2022-08-12 DIAGNOSIS — L280 Lichen simplex chronicus: Secondary | ICD-10-CM | POA: Diagnosis not present

## 2022-08-12 DIAGNOSIS — E785 Hyperlipidemia, unspecified: Secondary | ICD-10-CM

## 2022-08-12 DIAGNOSIS — T148XXA Other injury of unspecified body region, initial encounter: Secondary | ICD-10-CM | POA: Diagnosis not present

## 2022-08-12 DIAGNOSIS — Z79899 Other long term (current) drug therapy: Secondary | ICD-10-CM

## 2022-08-12 DIAGNOSIS — L819 Disorder of pigmentation, unspecified: Secondary | ICD-10-CM | POA: Diagnosis not present

## 2022-08-12 DIAGNOSIS — L2081 Atopic neurodermatitis: Secondary | ICD-10-CM | POA: Diagnosis not present

## 2022-08-12 MED ORDER — MOMETASONE FUROATE 0.1 % EX CREA: TOPICAL_CREAM | CUTANEOUS | 1 refills | Status: DC

## 2022-08-12 NOTE — Progress Notes (Signed)
   Follow-Up Visit   Subjective  David Skinner is a 72 y.o. male who presents for the following: Rash (Right temple. Dur: 3-4 months. Itching. Stings when scratched). Interpreter with patient.   The following portions of the chart were reviewed this encounter and updated as appropriate:  Tobacco  Allergies  Meds  Problems  Med Hx  Surg Hx  Fam Hx     Review of Systems: No other skin or systemic complaints except as noted in HPI or Assessment and Plan.  Objective  Well appearing patient in no apparent distress; mood and affect are within normal limits.  A focused examination was performed including face. Relevant physical exam findings are noted in the Assessment and Plan.  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 It is often associated with allergic rhinitis and/or asthma and can require treatment with topical medications, phototherapy, or in severe cases biologic injectable medication (Dupixent; Adbry) or Oral JAK inhibitors.  Start Mometasone cream  twice daily for 2 weeks then 5 days per week until return to office.  mometasone (ELOCON) 0.1 % cream - temples/hairline; R>L Apply twice daily for 2 weeks then 5 days per week until return to office.  Return in about 1 month (around 09/10/2022) for Atopic Dermatitis Follow Up.  I, Emelia Salisbury, CMA, am acting as scribe for Sarina Ser, MD. Documentation: I have reviewed the above documentation for accuracy and completeness, and I agree with the above.  Sarina Ser, MD

## 2022-08-12 NOTE — Patient Instructions (Addendum)
Start Mometasone cream  twice daily for 2 weeks then 5 days per week until return to office.   Topical steroids (such as triamcinolone, fluocinolone, fluocinonide, mometasone, clobetasol, halobetasol, betamethasone, hydrocortisone) can cause thinning and lightening of the skin if they are used for too long in the same area. Your physician has selected the right strength medicine for your problem and area affected on the body. Please use your medication only as directed by your physician to prevent side effects.    Gentle Skin Care Guide  1. Bathe no more than once a day.  2. Avoid bathing in hot water  3. Use a mild soap like Dove, Vanicream, Cetaphil, CeraVe. Can use Lever 2000 or Cetaphil antibacterial soap  4. Use soap only where you need it. On most days, use it under your arms, between your legs, and on your feet. Let the water rinse other areas unless visibly dirty.  5. When you get out of the bath/shower, use a towel to gently blot your skin dry, don't rub it.  6. While your skin is still a little damp, apply a moisturizing cream such as Vanicream, CeraVe, Cetaphil, Eucerin, Sarna lotion or plain Vaseline Jelly. For hands apply Neutrogena [place] Hand Cream or Excipial Hand Cream.  7. Reapply moisturizer any time you start to itch or feel dry.  8. Sometimes using free and clear laundry detergents can be helpful. Fabric softener sheets should be avoided. Downy Free & Gentle liquid, or any liquid fabric softener that is free of dyes and perfumes, it acceptable to use  9. If your doctor has given you prescription creams you may apply moisturizers over them     Due to recent changes in healthcare laws, you may see results of your pathology and/or laboratory studies on MyChart before the doctors have had a chance to review them. We understand that in some cases there may be results that are confusing or concerning to you. Please understand that not all results are received at the same  time and often the doctors may need to interpret multiple results in order to provide you with the best plan of care or course of treatment. Therefore, we ask that you please give [place] 2 business days to thoroughly review all your results before contacting the office for clarification. Should we see a critical lab result, you will be contacted sooner.   If You Need Anything After Your Visit  If you have any questions or concerns for your doctor, please call our main line at [phone] and press option 4 to reach your doctor's medical assistant. If no one answers, please leave a voicemail as directed and we will return your call as soon as possible. Messages left after 4 pm will be answered the following business day.   You may also send [place] a message via [place]. We typically respond to MyChart messages within 1-2 business days.  For prescription refills, please ask your pharmacy to contact our office. Our fax number is [phone].  If you have an urgent issue when the clinic is closed that cannot wait until the next business day, you can page your doctor at the number below.    Please note that while we do our best to be available for urgent issues outside of office hours, we are not available 24/7.   If you have an urgent issue and are unable to reach [place], you may choose to seek medical care at your doctor's office, retail clinic, urgent care center, or emergency  room.  If you have a medical emergency, please immediately call 911 or go to the emergency department.  Pager Numbers  - Dr. Nehemiah Massed: [phone]  - Dr. Laurence Ferrari: [phone]  - Dr. Nicole Kindred: [phone]  In the event of inclement weather, please call our main line at [phone] for an update on the status of any delays or closures.  Dermatology Medication Tips: Please keep the boxes that topical medications come in in order to help keep track of the instructions about where and how to use these. Pharmacies typically print  the medication instructions only on the boxes and not directly on the medication tubes.   If your medication is too expensive, please contact our office at [phone] option 4 or send [place] a message through [place].   We are unable to tell what your co-pay for medications will be in advance as this is different depending on your insurance coverage. However, we may be able to find a substitute medication at lower cost or fill out paperwork to get insurance to cover a needed medication.   If a prior authorization is required to get your medication covered by your insurance company, please allow [place] 1-2 business days to complete this process.  Drug prices often vary depending on where the prescription is filled and some pharmacies may offer cheaper prices.  The website www.goodrx.com contains coupons for medications through different pharmacies. The prices here do not account for what the cost may be with help from insurance (it may be cheaper with your insurance), but the website can give you the price if you did not use any insurance.  - You can print the associated coupon and take it with your prescription to the pharmacy.  - You may also stop by our office during regular business hours and pick up a GoodRx coupon card.  - If you need your prescription sent electronically to a different pharmacy, notify our office through [facility] or by phone at [phone] option 4.     Si Usted Necesita Algo Despus de Su Visita  Tambin puede enviarnos un mensaje a travs de Pharmacist, community. Por lo general respondemos a los mensajes de MyChart en el transcurso de 1 a 2 das hbiles.  Para renovar recetas, por favor pida a su farmacia que se ponga en contacto con nuestra oficina. Harland Dingwall de fax es [place] [phone].  Si tiene un asunto urgente cuando la clnica est cerrada y que no puede esperar hasta el siguiente da hbil, puede llamar/localizar a su doctor(a) al nmero que aparece a  continuacin.   Por favor, tenga en cuenta que aunque hacemos todo lo posible para estar disponibles para asuntos urgentes fuera del horario de [place], no estamos disponibles las 24 horas del da, los 7 das de la [place].   Si tiene un problema urgente y no puede comunicarse con nosotros, puede optar por buscar atencin mdica  en el consultorio de su doctor(a), en una clnica privada, en un centro de atencin urgente o en una sala de emergencias.  Si tiene Engineering geologist, por favor llame inmediatamente al 911 o vaya a la sala de emergencias.  Nmeros de bper  - Dr. Nehemiah Massed: [phone]  - Dra. Moye: [phone]  - Dra. Nicole Kindred: [phone]  En caso de inclemencias del [place], por favor llame a Johnsie Kindred principal al [phone] para una actualizacin sobre el [place] de cualquier retraso o cierre.  Consejos para la medicacin en dermatologa: Por favor, guarde las cajas en las que vienen los  medicamentos de uso tpico para ayudarle a seguir las [employer] dnde y cmo usarlos. Las farmacias generalmente imprimen las instrucciones del medicamento slo en las cajas y no directamente en los tubos del [place].   Si su medicamento es muy caro, por favor, pngase en contacto con Zigmund Daniel llamando al [phone] y presione la opcin 4 o envenos un mensaje a travs de Pharmacist, community.   No podemos decirle cul ser su copago por los medicamentos por adelantado ya que esto es diferente dependiendo de la cobertura de su seguro. Sin embargo, es posible que podamos encontrar un medicamento sustituto a Electrical engineer un formulario para que el seguro cubra el medicamento que se considera necesario.   Si se requiere una autorizacin previa para que su compaa de seguros [place] su medicamento, por favor permtanos de 1 a 2 das hbiles para completar este proceso.  Los precios de los medicamentos varan con frecuencia dependiendo del Environmental consultant de dnde se surte la receta  y alguna farmacias pueden ofrecer precios ms baratos.  El sitio web www.goodrx.com tiene cupones para medicamentos de Airline pilot. Los precios aqu no tienen en cuenta lo que podra costar con la ayuda del seguro (puede ser ms barato con su seguro), pero el sitio web puede darle el precio si no utiliz Research scientist (physical sciences).  - Puede imprimir el cupn correspondiente y llevarlo con su receta a la farmacia.  - Tambin puede pasar por nuestra oficina durante el horario de atencin regular y Charity fundraiser una tarjeta de cupones de GoodRx.  - Si necesita que su receta se enve electrnicamente a una farmacia diferente, informe a nuestra oficina a travs de MyChart de [place] o por telfono llamando al [phone] y presione la opcin 4.

## 2022-08-20 ENCOUNTER — Encounter: Payer: Self-pay | Admitting: Dermatology

## 2022-08-28 ENCOUNTER — Other Ambulatory Visit: Payer: Self-pay

## 2022-08-28 ENCOUNTER — Emergency Department: Payer: Medicare Other

## 2022-08-28 ENCOUNTER — Emergency Department
Admission: EM | Admit: 2022-08-28 | Discharge: 2022-08-28 | Disposition: A | Discharge status: Home / Self Care | Payer: Medicare Other | Attending: Emergency Medicine | Admitting: Emergency Medicine

## 2022-08-28 DIAGNOSIS — I10 Essential (primary) hypertension: Secondary | ICD-10-CM | POA: Diagnosis not present

## 2022-08-28 DIAGNOSIS — S22080A Wedge compression fracture of T11-T12 vertebra, initial encounter for closed fracture: Secondary | ICD-10-CM | POA: Insufficient documentation

## 2022-08-28 DIAGNOSIS — S060X0A Concussion without loss of consciousness, initial encounter: Secondary | ICD-10-CM | POA: Diagnosis not present

## 2022-08-28 DIAGNOSIS — S0990XA Unspecified injury of head, initial encounter: Secondary | ICD-10-CM | POA: Diagnosis not present

## 2022-08-28 DIAGNOSIS — F0390 Unspecified dementia without behavioral disturbance: Secondary | ICD-10-CM | POA: Diagnosis not present

## 2022-08-28 DIAGNOSIS — W19XXXA Unspecified fall, initial encounter: Secondary | ICD-10-CM | POA: Diagnosis not present

## 2022-08-28 DIAGNOSIS — Z7982 Long term (current) use of aspirin: Secondary | ICD-10-CM | POA: Diagnosis not present

## 2022-08-28 DIAGNOSIS — R519 Headache, unspecified: Secondary | ICD-10-CM | POA: Diagnosis not present

## 2022-08-28 DIAGNOSIS — Z79899 Other long term (current) drug therapy: Secondary | ICD-10-CM | POA: Insufficient documentation

## 2022-08-28 DIAGNOSIS — Z043 Encounter for examination and observation following other accident: Secondary | ICD-10-CM | POA: Diagnosis not present

## 2022-08-28 DIAGNOSIS — S199XXA Unspecified injury of neck, initial encounter: Secondary | ICD-10-CM | POA: Diagnosis not present

## 2022-08-28 MED ORDER — OXYCODONE HCL 5 MG PO TABS: 2.5000 mg | ORAL_TABLET | Freq: Two times a day (BID) | ORAL | 0 refills | Status: DC

## 2022-08-28 NOTE — Discharge Instructions (Signed)
Please avoid any bending lifting pushing or pulling.  Take Tylenol and ibuprofen as needed for pain.  You may use oxycodone as needed needed for severe breakthrough pain.  Call orthopedic office to schedule follow-up appointment.  Avoid bright lights, stimulation and physical activity until headache resolves.  Return to the ER for any severe headache, nausea vomiting, worsening symptoms or any urgent changes in your health

## 2022-08-28 NOTE — ED Provider Notes (Signed)
[facility] Provider Note   CSN: [id number] Arrival date & time: 08/28/22  1601     History  Chief Complaint  Patient presents with   Head Injury    David Skinner is a 72 y.o. male.  With past medical history of hypertension, reflux disease, dementia, alcoholism, diabetes presents to the emergency department for evaluation of a fall.  Patient presents with family members who state that he fell yesterday working on yard work, fell and hit the back of his head.  Injury occurred yesterday evening, he denies losing consciousness.  No nausea or vomiting but has been having a headache since.  Pain is on the posterior aspect of the head where he injured the head as well as in the neck and thoracic spine.  No numbness tingling or radicular symptoms.  Has not any medications for pain.  Has been ambulatory but no dizziness or lightheadedness or vision changes  HPI     Home Medications Prior to Admission medications   Medication Sig Start Date End Date Taking? Authorizing Provider  oxyCODONE (ROXICODONE) 5 MG immediate release tablet Take 0.5 tablets (2.5 mg total) by mouth every 12 (twelve) hours. 08/28/22 08/28/23 Yes Duanne Guess, PA-C  albuterol (VENTOLIN HFA) 108 (90 Base) MCG/ACT inhaler TAKE 2 PUFFS BY MOUTH EVERY 6 HOURS AS NEEDED FOR WHEEZE OR SHORTNESS OF BREATH 10/26/19   [name], MD  alfuzosin (UROXATRAL) 10 MG 24 hr tablet Take 1 tablet (10 mg total) by mouth daily. 01/28/22   Stoioff, Ronda Fairly, MD  aspirin EC 81 MG tablet Take 1 tablet (81 mg total) by mouth daily. 06/15/15   [name], MD  atorvastatin (LIPITOR) 80 MG tablet TAKE 1 TABLET BY MOUTH EVERY DAY 08/12/22   [name], MD  carboxymethylcellulose (REFRESH PLUS) 0.5 % SOLN Place 1 drop into both eyes 3 (three) times daily as needed.    [provider]  clobetasol (TEMOVATE) 0.05 % external solution Apply 1 application. topically See admin instructions.  Apply topically to aa's of scalp nightly 5 days weekly.  Avoid applying to face, groin, and axilla. Use as directed. 10/18/21   Ralene Bathe, MD  donepezil (ARICEPT) 10 MG tablet Take 10 mg by mouth at bedtime. 12/24/18   Vladimir Crofts, MD  doxycycline (VIBRAMYCIN) 50 MG capsule TAKE 2 CAPSULES BY MOUTH EVERY EVENING WITH FOOD. Patient not taking: Reported on 08/07/2022 05/16/22   Ralene Bathe, MD  DULoxetine (CYMBALTA) 60 MG capsule TAKE 1 CAPSULE BY MOUTH EVERY DAY 08/12/22   Ancil Boozer, Drue Stager, MD  fluticasone [facility]) 50 MCG/ACT nasal spray SPRAY 2 SPRAYS INTO EACH NOSTRIL EVERY DAY 01/10/22   Ancil Boozer, Drue Stager, MD  ketoconazole (NIZORAL) 2 % shampoo Shampoo scalp 3 times a week, let sit 10 minutes and rinse out 10/18/21   Ralene Bathe, MD  loratadine (CLARITIN) 10 MG tablet Take 1 tablet (10 mg total) by mouth 2 (two) times daily. 02/09/19   [name], MD  losartan (COZAAR) 25 MG tablet TAKE 1 TABLET DAILY 04/23/22   [name], MD  memantine (NAMENDA) 5 MG tablet Take 5 mg by mouth 2 (two) times daily. 06/27/21   [provider]  mometasone (ELOCON) 0.1 % cream Apply twice daily for 2 weeks then 5 days per week until return to office. 08/12/22   Ralene Bathe, MD  olopatadine (PATANOL) 0.1 % ophthalmic solution Place 1 drop into both eyes 2 (two) times daily. 06/06/21  [name], MD  silodosin (RAPAFLO) 8 MG CAPS capsule Take 8 mg by mouth daily with breakfast.    [provider]  tadalafil (CIALIS) 20 MG tablet 0.5-1 tab p.o. 1 hour prior to intercourse 05/19/20   Abbie Sons, MD  traZODone (DESYREL) 100 MG tablet TAKE 1 TABLET AT BEDTIME 06/18/22   Sowles, Drue Stager, MD  Vibegron (GEMTESA) 75 MG TABS Take 75 mg by mouth daily. 11/07/21   Stoioff, Ronda Fairly, MD      Allergies    Ibuprofen, Levofloxacin, Lisinopril, Metoprolol, Nsaids, Tolmetin, Tramadol hcl, and Tylenol [acetaminophen]    Review of Systems   Review of Systems  Physical Exam Updated  Vital Signs BP (!) 131/92 (BP Location: Left Arm)   Pulse 84   Temp 98.4 F (36.9 C) (Oral)   Resp 16   Ht '5\' 5"'$  (1.651 m)   Wt 71.9 kg   SpO2 93%   BMI 26.38 kg/m  Physical Exam Constitutional:      Appearance: He is well-developed.  HENT:     Head: Normocephalic.     Comments: Abrasion posterior occipital region, no swelling or hematoma.  No skin breakdown noted    Right Ear: There is no impacted cerumen.     Left Ear: There is no impacted cerumen.     Mouth/Throat:     Pharynx: No oropharyngeal exudate or posterior oropharyngeal erythema.  Eyes:     Conjunctiva/sclera: Conjunctivae normal.  Cardiovascular:     Rate and Rhythm: Normal rate.  Pulmonary:     Effort: Pulmonary effort is normal. No respiratory distress.  Abdominal:     General: There is no distension.     Tenderness: There is no abdominal tenderness. There is no guarding.  Musculoskeletal:        General: Normal range of motion.     Cervical back: Normal range of motion.  Skin:    General: Skin is warm.     Findings: No rash.  Neurological:     General: No focal deficit present.     Mental Status: He is alert and oriented to person, place, and time. Mental status is at baseline.  Psychiatric:        Behavior: Behavior normal.        Thought Content: Thought content normal.     ED Results / Procedures / Treatments   Labs (all labs ordered are listed, but only abnormal results are displayed) Labs Reviewed - No data to display  EKG None  Radiology DG Thoracic Spine 2 View  Result Date: 08/28/2022 CLINICAL DATA:  Fall EXAM: THORACIC SPINE 2 VIEWS COMPARISON:  None Available. FINDINGS: There may be very mild height loss of the anterior superior endplate of 624THL. Alignment is normal. No other significant bone abnormalities are identified. IMPRESSION: There may be very mild height loss of the anterior superior endplate of 624THL which could suggest a minimal compression deformity. Electronically Signed    By: Marin Roberts M.D.   On: 08/28/2022 17:51   CT Head Wo Contrast  Result Date: 08/28/2022 CLINICAL DATA:  Fall.  Headache.  Neck trauma. EXAM: CT HEAD WITHOUT CONTRAST CT CERVICAL SPINE WITHOUT CONTRAST TECHNIQUE: Multidetector CT imaging of the head and cervical spine was performed following the standard protocol without intravenous contrast. Multiplanar CT image reconstructions of the cervical spine were also generated. RADIATION DOSE REDUCTION: This exam was performed according to the departmental dose-optimization program which includes automated exposure control, adjustment of the mA and/or kV according to  patient size and/or use of iterative reconstruction technique. COMPARISON:  Head CT 09/03/2014.  MRI/MRA head 07/06/2018. FINDINGS: CT HEAD FINDINGS Brain: No acute hemorrhage, mass effect or midline shift. Gray-white differentiation is preserved. No hydrocephalus. No extra-axial collection. Basilar cisterns are patent. Vascular: No hyperdense vessel or unexpected calcification. Skull: No calvarial fracture or suspicious bone lesion. Skull base is unremarkable. Sinuses/Orbits: Unremarkable. Other: None. CT CERVICAL SPINE FINDINGS Alignment: Normal. Skull base and vertebrae: No acute fracture. Intact craniocervical junction. Soft tissues and spinal canal: No prevertebral fluid or swelling. No visible canal hematoma. 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 Upper chest: Unremarkable. Other: None. IMPRESSION: 1. No evidence of acute intracranial injury. 2. No acute cervical spine fracture or traumatic malalignment. 3. 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 Electronically Signed   By: Emmit Alexanders M.D.   On: 08/28/2022 17:17   CT Cervical Spine Wo Contrast  Result Date:  08/28/2022 CLINICAL DATA:  Fall.  Headache.  Neck trauma. EXAM: CT HEAD WITHOUT CONTRAST CT CERVICAL SPINE WITHOUT CONTRAST TECHNIQUE: Multidetector CT imaging of the head and cervical spine was performed following the standard protocol without intravenous contrast. Multiplanar CT image reconstructions of the cervical spine were also generated. RADIATION DOSE REDUCTION: This exam was performed according to the departmental dose-optimization program which includes automated exposure control, adjustment of the mA and/or kV according to patient size and/or use of iterative reconstruction technique. COMPARISON:  Head CT 09/03/2014.  MRI/MRA head 07/06/2018. FINDINGS: CT HEAD FINDINGS Brain: No acute hemorrhage, mass effect or midline shift. Gray-white differentiation is preserved. No hydrocephalus. No extra-axial collection. Basilar cisterns are patent. Vascular: No hyperdense vessel or unexpected calcification. Skull: No calvarial fracture or suspicious bone lesion. Skull base is unremarkable. Sinuses/Orbits: Unremarkable. Other: None. CT CERVICAL SPINE FINDINGS Alignment: Normal. Skull base and vertebrae: No acute fracture. Intact craniocervical junction. Soft tissues and spinal canal: No prevertebral fluid or swelling. No visible canal hematoma. 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 Upper chest: Unremarkable. Other: None. IMPRESSION: 1. No evidence of acute intracranial injury. 2. No acute cervical spine fracture or traumatic malalignment. 3. 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 Electronically Signed   By: Emmit Alexanders M.D.   On: 08/28/2022 17:17    Procedures Procedures    Medications Ordered in ED Medications - No data to display  ED Course/ Medical Decision Making/ A&P                              Medical Decision Making Amount and/or Complexity of Data Reviewed Radiology: ordered.  72 year old male with falls, headache and back pain.  No neurological deficits.  Ambulatory with no antalgic gait.  Patient appears well.  X-rays of thoracic spine show subtle T11 compression deformity, age-indeterminate.  CT scan of the head and neck negative for any acute bony abnormality or intracranial hemorrhage.  Patient with no neurological deficits.  Patient understands signs and symptoms return to the ER for.  Patient understands follow-up with orthopedics in regards to back pain.   Final Clinical Impression(s) / ED Diagnoses Final diagnoses:  Injury of head, initial  encounter  Closed wedge compression fracture of T11 vertebra, initial encounter ([place])  Concussion without loss of consciousness, initial encounter    Rx / DC Orders ED Discharge Orders          Ordered    oxyCODONE (ROXICODONE) 5 MG immediate release tablet  Every 12 hours        08/28/22 1835              Renata Caprice 08/28/22 Meade Maw, MD 08/28/22 [phone]

## 2022-08-28 NOTE — ED Triage Notes (Signed)
Fall yesterday in backyard.  No witness.  Unknown LOC.  Patient unable to sleep last night.  Headache.  Feels dizzy when closing eyes.  AAOx3.  Skin warm and dry.  Sent from [facility] for eval.

## 2022-09-06 ENCOUNTER — Ambulatory Visit (INDEPENDENT_AMBULATORY_CARE_PROVIDER_SITE_OTHER): Payer: Medicare Other | Admitting: Urology

## 2022-09-06 ENCOUNTER — Encounter: Payer: Self-pay | Admitting: Urology

## 2022-09-06 VITALS — BP 120/79 | HR 76 | Ht 65.0 in | Wt 155.0 lb

## 2022-09-06 DIAGNOSIS — N138 Other obstructive and reflux uropathy: Secondary | ICD-10-CM

## 2022-09-06 DIAGNOSIS — N401 Enlarged prostate with lower urinary tract symptoms: Secondary | ICD-10-CM

## 2022-09-06 DIAGNOSIS — N5201 Erectile dysfunction due to arterial insufficiency: Secondary | ICD-10-CM | POA: Diagnosis not present

## 2022-09-06 LAB — URINALYSIS, COMPLETE
Bilirubin, UA: NEGATIVE
Glucose, UA: NEGATIVE
Ketones, UA: NEGATIVE
Leukocytes,UA: NEGATIVE
Nitrite, UA: NEGATIVE
Protein,UA: NEGATIVE
RBC, UA: NEGATIVE
Specific Gravity, UA: >1.03 — ABNORMAL HIGH (ref 1.005–1.030)
Urobilinogen, Ur: 0.2 mg/dL (ref 0.2–1.0)
pH, UA: 5 (ref 5.0–7.5)

## 2022-09-06 LAB — MICROSCOPIC EXAMINATION

## 2022-09-06 LAB — BLADDER SCAN AMB NON-IMAGING: Scan Result: 23

## 2022-09-06 NOTE — Progress Notes (Signed)
I, DeAsia L Maxie,acting as a scribe for Abbie Sons, MD.,have documented all relevant documentation on the behalf of Abbie Sons, MD,as directed by  Abbie Sons, MD while in the presence of Abbie Sons, MD.   09/06/22 8:52 AM   David Skinner [place] 1950/10/28 [id number]  Referring provider: [name], MD [address] [place],  [place] [address]   Urologic history: 1. BPH with LUTS Alfuzosin 10 mg daily Cystoscopy in 2019 showed no significant lateral lobe enlargement or bladder mucosal abnormalities  2.  Erectile dysfunction Tadalafil 20 mg prn   HPI: 72 y.o. male presents for annual follow-up.  A [place] interpreter was present during the visit.  He has stopped Alfuzosin Complains of worsening symptoms since his last visit with the most bothersome symptoms. Increased frequency, urgency, urge incontinence, a sensation of incomplete emptying, and nocturia x 5  Stable ED on tadalafil PVR 23 mL; IPSS 33/35 Most recent PSA 11/15/21 0.9  PMH: Past Medical History:  Diagnosis Date   Allergy    Anxiety    Atopy    Cataract of right eye    [place]   Chronic constipation    Elevated LFTs    Fatty liver    GERD (gastroesophageal reflux disease)    Hyperlipidemia    Hypertension    Hypoglycemia    Insomnia    Memory change    Overweight    Paresthesia of hand    Seizure ([place])    12/12, 09/27/14, 02/28/16   Tenosynovitis of finger    Tinea cruris    Vitamin D deficiency     Surgical History: Past Surgical History:  Procedure Laterality Date   COLONOSCOPY WITH PROPOFOL N/A 03/25/2017   Procedure: COLONOSCOPY WITH PROPOFOL;  Surgeon: Lin Landsman, MD;  Location: [place];  Service: Endoscopy;  Laterality: N/A;   COLONOSCOPY WITH PROPOFOL N/A 01/16/2021   Procedure: COLONOSCOPY WITH PROPOFOL;  Surgeon: [name], MD;  Location: [facility] ENDOSCOPY;  Service: Gastroenterology;  Laterality: N/A;  USE [place] VIDEO  INTERPRETER   ESOPHAGOGASTRODUODENOSCOPY N/A 03/25/2017   Procedure: ESOPHAGOGASTRODUODENOSCOPY (EGD);  Surgeon: Lin Landsman, MD;  Location: [place];  Service: Endoscopy;  Laterality: N/A;  Diabetic - oral meds   LASIK Bilateral     Home Medications:  Allergies as of 09/06/2022       Reactions   Ibuprofen Itching   Levofloxacin    Lisinopril Cough   Metoprolol    Nsaids Itching   Tolmetin Itching   Tramadol Hcl Itching   Tylenol [acetaminophen] Itching        Medication List        Accurate as of September 06, 2022  8:52 AM. If you have any questions, ask your nurse or doctor.          STOP taking these medications    albuterol 108 (90 Base) MCG/ACT inhaler Commonly known as: VENTOLIN HFA Stopped by: Abbie Sons, MD   carboxymethylcellulose 0.5 % Soln Commonly known as: REFRESH PLUS Stopped by: Abbie Sons, MD   clobetasol 0.05 % external solution Commonly known as: TEMOVATE Stopped by: Abbie Sons, MD   doxycycline 50 MG capsule Commonly known as: VIBRAMYCIN Stopped by: Abbie Sons, MD   fluticasone 50 MCG/ACT nasal spray Commonly known as: FLONASE Stopped by: Abbie Sons, MD   Gemtesa 75 MG Tabs Generic drug: Vibegron Stopped by: Abbie Sons, MD   ketoconazole 2 % shampoo  Commonly known as: NIZORAL Stopped by: Abbie Sons, MD   loratadine 10 MG tablet Commonly known as: CLARITIN Stopped by: Abbie Sons, MD   memantine 5 MG tablet Commonly known as: NAMENDA Stopped by: Abbie Sons, MD   mometasone 0.1 % cream Commonly known as: ELOCON Stopped by: Abbie Sons, MD   olopatadine 0.1 % ophthalmic solution Commonly known as: Patanol Stopped by: Abbie Sons, MD   oxyCODONE 5 MG immediate release tablet Commonly known as: Roxicodone Stopped by: Abbie Sons, MD   silodosin 8 MG Caps capsule Commonly known as: RAPAFLO Stopped by: Abbie Sons, MD   tadalafil 20 MG  tablet Commonly known as: Cialis Stopped by: Abbie Sons, MD       TAKE these medications    alfuzosin 10 MG 24 hr tablet Commonly known as: UROXATRAL Take 10 mg by mouth daily with breakfast. What changed: Another medication with the same name was removed. Continue taking this medication, and follow the directions you see here. Changed by: Abbie Sons, MD   aspirin EC 81 MG tablet Take 1 tablet (81 mg total) by mouth daily.   atorvastatin 80 MG tablet Commonly known as: LIPITOR TAKE 1 TABLET BY MOUTH EVERY DAY   donepezil 10 MG tablet Commonly known as: ARICEPT Take 10 mg by mouth at bedtime.   DULoxetine 60 MG capsule Commonly known as: CYMBALTA TAKE 1 CAPSULE BY MOUTH EVERY DAY   losartan 25 MG tablet Commonly known as: COZAAR TAKE 1 TABLET DAILY   montelukast 10 MG tablet Commonly known as: SINGULAIR Take 10 mg by mouth at bedtime.   traZODone 100 MG tablet Commonly known as: DESYREL TAKE 1 TABLET AT BEDTIME        Allergies:  Allergies  Allergen Reactions   Ibuprofen Itching   Levofloxacin    Lisinopril Cough   Metoprolol    Nsaids Itching   Tolmetin Itching   Tramadol Hcl Itching   Tylenol [Acetaminophen] Itching    Family History: Family History  Problem Relation Age of Onset   Diabetes Mother    Heart disease Mother     Social History:  reports that he quit smoking about 22 years ago. His smoking use included cigarettes. He started smoking about 38 years ago. He has a 15.00 pack-year smoking history. He has never used smokeless tobacco. He reports current alcohol use of about 1.0 standard drink of alcohol per week. He reports that he does not use drugs.   Physical Exam: BP 120/79   Pulse 76   Ht 5\' 5"  (1.651 m)   Wt 155 lb (70.3 kg)   BMI 25.79 kg/m   Constitutional:  Alert and oriented, No acute distress. HEENT: [place] AT, moist mucus membranes.  Trachea midline, no masses. Cardiovascular: No clubbing, cyanosis, or  edema. Respiratory: Normal respiratory effort, no increased work of breathing. GI: Abdomen is soft, nontender, nondistended, no abdominal masses   Assessment & Plan:    1.  BPH with lower urinary tract symptoms He has failed multiple medications including Tamsulosin, Silodosin, and Alfuzosin He did have some benefit with a Beta-3 agonist but could not afford  Will schedule cystoscopy for further evaluation of his outlet  2.  Erectile dysfunction Stable on tadalafil  I have reviewed the above documentation for accuracy and completeness, and I agree with the above.   Abbie Sons, [place] [address], [place], [place] [address] [phone]

## 2022-09-11 ENCOUNTER — Ambulatory Visit: Payer: Medicare Other | Admitting: Dermatology

## 2022-09-11 ENCOUNTER — Ambulatory Visit: Payer: Medicare Other | Admitting: Urology

## 2022-09-25 DIAGNOSIS — E782 Mixed hyperlipidemia: Secondary | ICD-10-CM | POA: Diagnosis not present

## 2022-09-25 DIAGNOSIS — I38 Endocarditis, valve unspecified: Secondary | ICD-10-CM | POA: Diagnosis not present

## 2022-09-25 DIAGNOSIS — R42 Dizziness and giddiness: Secondary | ICD-10-CM | POA: Diagnosis not present

## 2022-09-25 DIAGNOSIS — I2510 Atherosclerotic heart disease of native coronary artery without angina pectoris: Secondary | ICD-10-CM | POA: Diagnosis not present

## 2022-09-25 DIAGNOSIS — Z87891 Personal history of nicotine dependence: Secondary | ICD-10-CM | POA: Diagnosis not present

## 2022-09-25 DIAGNOSIS — E119 Type 2 diabetes mellitus without complications: Secondary | ICD-10-CM | POA: Diagnosis not present

## 2022-09-25 DIAGNOSIS — G4733 Obstructive sleep apnea (adult) (pediatric): Secondary | ICD-10-CM | POA: Diagnosis not present

## 2022-09-25 DIAGNOSIS — I10 Essential (primary) hypertension: Secondary | ICD-10-CM | POA: Diagnosis not present

## 2022-09-25 DIAGNOSIS — R0602 Shortness of breath: Secondary | ICD-10-CM | POA: Diagnosis not present

## 2022-09-26 ENCOUNTER — Encounter: Payer: Self-pay | Admitting: Urology

## 2022-09-26 ENCOUNTER — Ambulatory Visit (INDEPENDENT_AMBULATORY_CARE_PROVIDER_SITE_OTHER): Payer: Medicare Other | Admitting: Urology

## 2022-09-26 VITALS — BP 141/89 | HR 93 | Ht 65.0 in | Wt 150.0 lb

## 2022-09-26 DIAGNOSIS — R3915 Urgency of urination: Secondary | ICD-10-CM | POA: Diagnosis not present

## 2022-09-26 DIAGNOSIS — N138 Other obstructive and reflux uropathy: Secondary | ICD-10-CM | POA: Diagnosis not present

## 2022-09-26 DIAGNOSIS — N401 Enlarged prostate with lower urinary tract symptoms: Secondary | ICD-10-CM

## 2022-09-26 LAB — MICROSCOPIC EXAMINATION

## 2022-09-26 LAB — URINALYSIS, COMPLETE
Bilirubin, UA: NEGATIVE
Glucose, UA: NEGATIVE
Ketones, UA: NEGATIVE
Leukocytes,UA: NEGATIVE
Nitrite, UA: NEGATIVE
Protein,UA: NEGATIVE
RBC, UA: NEGATIVE
Specific Gravity, UA: >1.03 — ABNORMAL HIGH (ref 1.005–1.030)
Urobilinogen, Ur: 0.2 mg/dL (ref 0.2–1.0)
pH, UA: 5.5 (ref 5.0–7.5)

## 2022-09-26 MED ORDER — GEMTESA 75 MG PO TABS: 75.0000 mg | ORAL_TABLET | Freq: Every day | ORAL | 0 refills | Status: DC

## 2022-09-26 NOTE — Progress Notes (Signed)
   09/26/22  CC:  Chief Complaint  Patient presents with   Cysto    HPI: Refer to previous office note 09/06/2022  Blood pressure (!) 141/89, pulse 93, height 5\' 5"  (1.651 m), weight 150 lb (68 kg). NED. A&Ox3.   No respiratory distress   Abd soft, NT, ND Normal phallus with bilateral descended testicles  Cystoscopy Procedure Note  Patient identification was confirmed, informed consent was obtained, and patient was prepped using Betadine solution.  Lidocaine jelly was administered per urethral meatus.     Pre-Procedure: - Inspection reveals a normal caliber urethral meatus.  Procedure: The flexible cystoscope was introduced without difficulty - No urethral strictures/lesions are present. -Mild lateral lobe enlargement prostate w/ short prostatic urethra - Normal bladder neck - Bilateral ureteral orifices identified - Bladder mucosa  reveals no ulcers, tumors, or lesions - No bladder stones - No trabeculation  Retroflexion shows no abnormalities   Post-Procedure: - Patient tolerated the procedure well  Assessment/ Plan: Mild lateral lobe enlargement Has failed several alpha blockers, Solifenacin.  He did note significant treatment with Gemtesa and requested another 4-week trial and if efficacy continues that he would like to recheck his out-of-pocket cost    Riki Altes, MD

## 2022-10-04 ENCOUNTER — Other Ambulatory Visit: Payer: Self-pay | Admitting: Family Medicine

## 2022-10-04 DIAGNOSIS — F5101 Primary insomnia: Secondary | ICD-10-CM

## 2022-10-14 DIAGNOSIS — I6523 Occlusion and stenosis of bilateral carotid arteries: Secondary | ICD-10-CM | POA: Diagnosis not present

## 2022-10-14 DIAGNOSIS — R42 Dizziness and giddiness: Secondary | ICD-10-CM | POA: Diagnosis not present

## 2022-10-14 DIAGNOSIS — I2510 Atherosclerotic heart disease of native coronary artery without angina pectoris: Secondary | ICD-10-CM | POA: Diagnosis not present

## 2022-10-23 DIAGNOSIS — E782 Mixed hyperlipidemia: Secondary | ICD-10-CM | POA: Diagnosis not present

## 2022-10-23 DIAGNOSIS — I10 Essential (primary) hypertension: Secondary | ICD-10-CM | POA: Diagnosis not present

## 2022-10-23 DIAGNOSIS — R0602 Shortness of breath: Secondary | ICD-10-CM | POA: Diagnosis not present

## 2022-10-23 DIAGNOSIS — I25118 Atherosclerotic heart disease of native coronary artery with other forms of angina pectoris: Secondary | ICD-10-CM | POA: Diagnosis not present

## 2022-10-23 DIAGNOSIS — R42 Dizziness and giddiness: Secondary | ICD-10-CM | POA: Diagnosis not present

## 2022-11-04 ENCOUNTER — Other Ambulatory Visit: Payer: Self-pay

## 2022-11-04 ENCOUNTER — Encounter: Payer: Self-pay | Admitting: Gastroenterology

## 2022-11-04 ENCOUNTER — Ambulatory Visit (INDEPENDENT_AMBULATORY_CARE_PROVIDER_SITE_OTHER): Payer: Medicare Other | Admitting: Gastroenterology

## 2022-11-04 VITALS — BP 129/84 | HR 90 | Temp 98.7°F | Wt 157.0 lb

## 2022-11-04 DIAGNOSIS — R197 Diarrhea, unspecified: Secondary | ICD-10-CM

## 2022-11-04 NOTE — Progress Notes (Signed)
Wyline Mood MD, MRCP(U.K) [address]  [address]  [place], [place] [address]  Main: [phone]  Fax: [phone]   Primary Care Physician: Alba Cory, MD  Primary Gastroenterologist:  Dr. Wyline Mood   Chief Complaint  Patient presents with   Diarrhea    HPI: David Skinner is a 72 y.o. male Summary of history :   Initially referred and seen in 12/2020 for change in bowel habits with rectal bleeding.  01/16/2021: Colonoscopy : 4 sessile diminutive polyps resected. Adenoma x2   In November 2023 he came to see me for diarrhea  for over 6 months with weight loss.  At that time his drinking 5 to 6 cans of soda per day.  When compared to our records there was no evidence of weight loss.   Interval history   04/18/2022-11/04/2022   03/13/2019 3 GI PCR negative 04/23/2022 fecal calprotectin normal hemoglobin 14.9 CMP normal CRP less than 1. Since his last visit he states that he cut down consuming sodas and the diarrhea has significantly resolved and he only has 2 soft bowel movements per day and it is not watery in nature.  He still drinks soda at least 1 can a day.   Current Outpatient Medications  Medication Sig Dispense Refill   alfuzosin (UROXATRAL) 10 MG 24 hr tablet Take 10 mg by mouth daily with breakfast.     ASPIRIN 81 PO Take 1 tablet by mouth daily.     atorvastatin (LIPITOR) 80 MG tablet TAKE 1 TABLET BY MOUTH EVERY DAY 90 tablet 0   donepezil (ARICEPT) 10 MG tablet Take 10 mg by mouth at bedtime.     DULoxetine (CYMBALTA) 60 MG capsule TAKE 1 CAPSULE BY MOUTH EVERY DAY 90 capsule 1   isosorbide mononitrate (IMDUR) 30 MG 24 hr tablet Take 30 mg by mouth daily.     losartan (COZAAR) 25 MG tablet TAKE 1 TABLET DAILY 90 tablet 1   montelukast (SINGULAIR) 10 MG tablet Take 10 mg by mouth at bedtime.     traZODone (DESYREL) 100 MG tablet TAKE 1 TABLET BY MOUTH EVERYDAY AT BEDTIME 90 tablet 0   Vibegron (GEMTESA) 75 MG TABS Take 1 tablet (75 mg total) by mouth  daily. 30 tablet 0   No current facility-administered medications for this visit.    Allergies as of 11/04/2022 - Review Complete 09/26/2022  Allergen Reaction Noted   Ibuprofen Itching 11/28/2014   Levofloxacin  11/28/2014   Lisinopril Cough 11/28/2014   Metoprolol  11/28/2014   Nsaids Itching 03/08/2015   Tolmetin Itching 03/08/2015   Tramadol hcl Itching 11/28/2014   Tylenol [acetaminophen] Itching 05/18/2020       ROS:  General: Negative for anorexia, weight loss, fever, chills, fatigue, weakness. ENT: Negative for hoarseness, difficulty swallowing , nasal congestion. CV: Negative for chest pain, angina, palpitations, dyspnea on exertion, peripheral edema.  Respiratory: Negative for dyspnea at rest, dyspnea on exertion, cough, sputum, wheezing.  GI: See history of present illness. GU:  Negative for dysuria, hematuria, urinary incontinence, urinary frequency, nocturnal urination.  Endo: Negative for unusual weight change.    Physical Examination:   There were no vitals taken for this visit.  General: Well-nourished, well-developed in no acute distress.  Eyes: No icterus. Conjunctivae pink. Neuro: Alert and oriented x 3.  Grossly intact. Skin: Warm and dry, no jaundice.   Psych: Alert and cooperative, normal mood and affect.   Imaging Studies: No results found.  Assessment and Plan:  David Skinner is a 73 y.o. y/o male here to follow-up for diarrhea last seen in the office on 05/06/2022.  Fecal calprotectin was negative indicating no colonic or GI tract inflammation.  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  Presently his diarrhea has significantly improved he only has 2 soft formed bowel movements per day.  Advised him to watch intake of sodas.  If his symptoms worsen he has been  advised to come back and see me          Dr Wyline Mood  MD,MRCP [facility]) Follow up in as needed

## 2022-11-05 ENCOUNTER — Ambulatory Visit (INDEPENDENT_AMBULATORY_CARE_PROVIDER_SITE_OTHER): Payer: Medicare Other | Admitting: Dermatology

## 2022-11-05 DIAGNOSIS — L280 Lichen simplex chronicus: Secondary | ICD-10-CM | POA: Diagnosis not present

## 2022-11-05 DIAGNOSIS — S0081XA Abrasion of other part of head, initial encounter: Secondary | ICD-10-CM

## 2022-11-05 DIAGNOSIS — Z7189 Other specified counseling: Secondary | ICD-10-CM

## 2022-11-05 DIAGNOSIS — G3184 Mild cognitive impairment, so stated: Secondary | ICD-10-CM | POA: Diagnosis not present

## 2022-11-05 DIAGNOSIS — R569 Unspecified convulsions: Secondary | ICD-10-CM | POA: Diagnosis not present

## 2022-11-05 DIAGNOSIS — L2081 Atopic neurodermatitis: Secondary | ICD-10-CM | POA: Diagnosis not present

## 2022-11-05 DIAGNOSIS — T07XXXA Unspecified multiple injuries, initial encounter: Secondary | ICD-10-CM

## 2022-11-05 DIAGNOSIS — Z79899 Other long term (current) drug therapy: Secondary | ICD-10-CM | POA: Diagnosis not present

## 2022-11-05 DIAGNOSIS — L819 Disorder of pigmentation, unspecified: Secondary | ICD-10-CM

## 2022-11-05 DIAGNOSIS — R0602 Shortness of breath: Secondary | ICD-10-CM | POA: Diagnosis not present

## 2022-11-05 DIAGNOSIS — F5104 Psychophysiologic insomnia: Secondary | ICD-10-CM | POA: Diagnosis not present

## 2022-11-05 NOTE — Progress Notes (Signed)
   Follow-Up Visit   Subjective  David Skinner is a 72 y.o. 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 Pt currently using Mometasone 0.1% cream QD and states that his scalp has improved, but the temple/hairline areas have not.  Interpreter with patient and interprets conversation  The following portions of the chart were reviewed this encounter and updated as appropriate: medications, allergies, medical history  Review of Systems:  No other skin or systemic complaints except as noted in HPI or Assessment and Plan.  Objective  Well appearing patient in no apparent distress; mood and affect are within normal limits. A focused examination was performed of the following areas: the face and scalp Relevant exam findings are noted in the Assessment and Plan.   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 L mastoid and vertex clear.   Atopic dermatitis (eczema) is a chronic, relapsing, pruritic condition that can significantly affect quality of life. It is often associated with allergic rhinitis and/or asthma and can require treatment with topical medications, phototherapy, or in severe cases biologic injectable medication (Dupixent; Adbry) or Oral JAK inhibitors.  Continue Mometasone cream  twice daily for 2 weeks then 5 days per week.  Start [facility] anti-itch cream BID. Amitriptyline: 5%, Gabapentin: 10%, Lidocaine: 5%, Vehicle: Cream  Restart Singulair 10 mg po QHS.   Start Allegra or Claritin QD.   Consider Dupixent or Adbry in the future if not improving with antihistamines and topical treatment.   Return in about 3 months (around 02/05/2023) for atopic neurodermatitis follow up.  Maylene Roes, CMA, am acting  as scribe for Armida Sans, MD .  Documentation: I have reviewed the above documentation for accuracy and completeness, and I agree with the above.  Armida Sans, MD

## 2022-11-05 NOTE — Progress Notes (Unsigned)
Name: David Skinner   MRN: [id number]    DOB: 30-Oct-1950   Date:11/06/2022       Progress Note  Subjective  Chief Complaint  Follow Up  HPI  Diarrhea: seen by GI and symptoms improved when he decreased soda intake   Major depression  in remission : going on for years but  worse in  2018, he was on Cymbalta since April 2018, he stopped for a period of time, but he states he is back on medications  Hyperlipidemia :he is taking Atorvastatin  80 mg daily No myalgia or chest pain. Last LDL went up from 59 to 80 , he states compliant with medication but his memory is poor, we will recheck labs today    OSA/minimal/RLS/Memory loss :he had a  sleep study back in 2018 it showed minimal sleep apnea and RLS - he was seen by Dr. Sherryll Burger this week, he states taking medications as prescribed, we will check the labs Dr. Sherryll Burger recommended   HTN: BP is slightly above his bp, he takes losartan 25 mg daily . He denies side effects, hist bp is low today and we will need to monitor and consider going down on dose   History Alcoholism : he used to drink heavy while in [place] used to drink liquor at least 3 times a week and used to get drunk, when he moved to [place] ( 1979) . He was still drinking in [place] usually beer 2017. He states he started drinking alcohol again, he is drinking shots on weekends    Urinary frequency: under the care of Urologist, he was given  Urolxatral and also Rapaflo.He did not bring his medications. He needs to see Urologist    Partial Seizure: Last episode 02/2016 and went to [facility], he was taking medication given by Dr. Sherryll Burger he stopped taking lamictal, but he brought a picture of his medications and looks like he is taking it. Explained importance of coming with a relative and also to bring all his medications    Diabetes Type 2 diet controlled with ED, hgbA1C had gone up to 6.9% and we started Metformin back in 10/2015 but we stopped medication months ago due to diarrhea . He  denies polyphagia  or polydipsia, has urinary frequency due to LUTS  Continues statin therapy. A1C was 6.6 %. He will return in one month for follow up   Insomnia: he sleeps well with Trazodone, no side effects   Seborrheic dermatitis:he is under the care of Dr. 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) 03/09/2015   Chronic constipation 11/28/2014   Insomnia 11/28/2014   Dyslipidemia 11/28/2014   Fatty infiltration of liver 11/28/2014   Essential (primary) hypertension 11/28/2014   Gastric reflux 11/28/2014   Memory loss or impairment 11/28/2014   Perennial allergic rhinitis with seasonal variation 11/28/2014   Vitamin D deficiency 11/28/2014   Tenosynovitis of thumb 11/28/2014   Partial epilepsy with impairment of consciousness (HCC) 11/16/2014    Past Surgical History:  Procedure Laterality Date   COLONOSCOPY WITH PROPOFOL N/A 03/25/2017   Procedure: COLONOSCOPY WITH PROPOFOL;  Surgeon: Toney Reil, MD;  [facility];  Service: Endoscopy;  Laterality: N/A;   COLONOSCOPY WITH PROPOFOL N/A 01/16/2021   Procedure: COLONOSCOPY WITH PROPOFOL;  Surgeon: Wyline Mood, MD;  Location: [facility];  Service: Gastroenterology;  Laterality: N/A;  USE VIET[place]ESE VIDEO INTERPRETER   ESOPHAGOGASTRODUODENOSCOPY N/A 03/25/2017   Procedure: ESOPHAGOGASTRODUODENOSCOPY (EGD);  Surgeon: Toney Reil, MD;  Location: [facility];  Service:  Endoscopy;  Laterality: N/A;  Diabetic - oral meds   LASIK Bilateral     Family History  Problem Relation Age of Onset   Diabetes Mother    Heart disease Mother     Social History   Tobacco Use   Smoking status: Former    Packs/day: 1.00    Years: 15.00    Additional pack years: 0.00    Total pack years: 15.00    Types: Cigarettes    Start date: 06/17/1984    Quit date: 11/28/1999    Years since quitting: 22.9   Smokeless tobacco: Never  Substance Use Topics   Alcohol use: Yes    Alcohol/week: 1.0 standard drink of alcohol    Types: 1 Cans of beer per week    Comment: occassional beer     Current Outpatient Medications:    alfuzosin (UROXATRAL) 10 MG 24 hr tablet, Take 10 mg by mouth daily with breakfast., Disp: , Rfl:    ASPIRIN 81 PO, Take 1 tablet by mouth daily., Disp: , Rfl:    donepezil (ARICEPT) 10 MG tablet, Take 10 mg by mouth at bedtime., Disp: , Rfl:    DULoxetine (CYMBALTA) 60 MG capsule, TAKE 1 CAPSULE BY MOUTH EVERY DAY, Disp: 90 capsule, Rfl: 1   isosorbide mononitrate (IMDUR) 30 MG 24 hr tablet, Take 30 mg by mouth daily., Disp: , Rfl:    memantine (NAMENDA) 5 MG tablet, Take 5 mg by mouth 2 (two) times daily., Disp: , Rfl:    montelukast (SINGULAIR) 10 MG tablet, Take 10 mg by mouth at bedtime., Disp: , Rfl:    traZODone (DESYREL) 100 MG tablet, TAKE 1 TABLET BY MOUTH EVERYDAY AT BEDTIME, Disp: 90 tablet, Rfl: 0   Vibegron (GEMTESA) 75 MG TABS, Take 1 tablet (75 mg total) by mouth daily., Disp: 30 tablet, Rfl: 0   atorvastatin (LIPITOR) 80 MG tablet, Take 1 tablet (80 mg total) by mouth daily., Disp: 90 tablet, Rfl: 0   losartan (COZAAR) 25 MG tablet, Take 1 tablet (25 mg total) by mouth daily., Disp: 90 tablet, Rfl: 1  Allergies  Allergen Reactions   Ibuprofen Itching   Levofloxacin    Lisinopril Cough   Metoprolol    Nsaids Itching   Tolmetin Itching   Tramadol Hcl Itching   Tylenol [Acetaminophen] Itching    I personally reviewed active problem  list, medication list, allergies, family history, social history, health maintenance with the patient/caregiver today.   ROS  Constitutional: Negative for fever or weight change.  Respiratory: Negative for cough and shortness of breath.   Cardiovascular: Negative for chest pain or palpitations.  Gastrointestinal: Negative for abdominal pain, no bowel changes.  Musculoskeletal: Negative for gait problem or joint swelling.  Skin: Negative for rash.  Neurological: Negative for dizziness or headache.  No other specific complaints in a complete review of systems (except as listed in HPI above).   Objective  Vitals:   11/06/22 1004  BP: 116/74  Pulse: 95  Resp: 14  Temp: 97.7 F (36.5 C)  TempSrc: Oral  SpO2: 99%  Weight: 156 lb 12.8 oz (71.1 kg)  Height: 5\' 5"  (1.651 m)    Body mass index is 26.09 kg/m.  Physical Exam  Constitutional: Patient appears well-developed and well-nourished.  No distress.  HEENT: head atraumatic, normocephalic, pupils equal and reactive to light, neck supple Cardiovascular: Normal rate, regular rhythm and normal heart sounds.  No murmur heard. No BLE edema. Pulmonary/Chest: Effort normal and breath sounds normal. No respiratory distress. Abdominal: Soft.  There is no tenderness. Psychiatric: Patient has a normal mood and affect. behavior is normal. Memory loss  Recent Results (from the past 2160 hour(s))  Urinalysis, Complete     Status: Abnormal   Collection Time: 09/06/22  8:02 AM  Result Value Ref Range   Specific Gravity, UA >1.030 (H) 1.005 - 1.030   pH, UA 5.0 5.0 - 7.5   Color, UA Yellow Yellow   Appearance Ur Clear Clear   Leukocytes,UA Negative Negative   Protein,UA Negative Negative/Trace   Glucose, UA Negative Negative   Ketones, UA Negative Negative   RBC, UA Negative Negative   Bilirubin, UA Negative Negative   Urobilinogen, Ur 0.2 0.2 - 1.0 mg/dL   Nitrite, UA Negative Negative   Microscopic Examination See below:    Microscopic Examination     Status: Abnormal   Collection Time: 09/06/22  8:02 AM   Urine  Result Value Ref Range   WBC, UA 0-5 0 - 5 /hpf   RBC, Urine 0-2 0 - 2 /hpf   Epithelial Cells (non renal) 0-10 0 - 10 /hpf   Casts Present (A) None seen /lpf   Cast Type Hyaline casts N/A   Mucus, UA Present (A) Not Estab.   Bacteria, UA Moderate (A) None seen/Few  Bladder Scan (Post Void Residual) in office     Status: None   Collection Time: 09/06/22  8:09 AM  Result Value Ref Range   Scan Result 23   Urinalysis, Complete     Status: Abnormal   Collection Time: 09/26/22 10:21 AM  Result Value Ref Range   Specific Gravity, UA >1.030 (H) 1.005 - 1.030   pH, UA 5.5 5.0 - 7.5   Color, UA Yellow Yellow   Appearance Ur Clear Clear   Leukocytes,UA Negative Negative   Protein,UA Negative Negative/Trace   Glucose, UA Negative Negative   Ketones, UA Negative Negative   RBC, UA Negative Negative   Bilirubin, UA Negative Negative   Urobilinogen, Ur 0.2 0.2 - 1.0 mg/dL   Nitrite, UA Negative Negative   Microscopic Examination See below:   Microscopic Examination     Status: Abnormal   Collection Time: 09/26/22 10:21 AM   Urine  Result Value Ref Range   WBC, UA 0-5 0 - 5 /hpf   RBC, Urine 0-2 0 - 2 /hpf   Epithelial Cells (non renal) 0-10 0 - 10 /hpf   Mucus, UA Present (A) Not Estab.   Bacteria, UA Few None seen/Few    PHQ2/9:    11/06/2022   10:08 AM 08/07/2022   11:06 AM 03/11/2022    2:51 PM 12/05/2021    8:47 AM 06/19/2021    8:50 AM  Depression screen PHQ 2/9  Decreased Interest 0 0 0 0 0  Down, Depressed, Hopeless 0 0 0 0 0  PHQ - 2 Score 0 0 0 0 0  Altered sleeping 0 0 0 0 0  Tired, decreased  energy 0 0 0 1 0  Change in appetite 0 0 0 3 1  Feeling bad or failure about yourself  0 0 0 0 0  Trouble concentrating 0 0 0 0 1  Moving slowly or fidgety/restless 0 0 0 0 0  Suicidal thoughts 0 0 0 0 0  PHQ-9 Score 0 0 0 4 2  Difficult doing work/chores   Not difficult at all       phq 9 is negative   Fall Risk:    11/06/2022   10:05 AM 08/07/2022   11:06 AM 03/11/2022    2:51 PM 12/05/2021    8:47 AM 06/19/2021    9:03 AM  Fall Risk   Falls in the past year? 0 0 0 0 0  Number falls in past yr:   0 0 0  Injury with Fall?   0 0 0  Risk for fall due to : No Fall Risks No Fall Risks  No Fall Risks No Fall Risks  Follow up Falls prevention discussed Falls prevention discussed  Falls prevention discussed Falls prevention discussed      Functional Status Survey: Is the patient deaf or have difficulty hearing?: No Does the patient have difficulty seeing, even when wearing glasses/contacts?: No Does the patient have difficulty concentrating, remembering, or making decisions?: No Does the patient have difficulty walking or climbing stairs?: No Does the patient have difficulty dressing or bathing?: No Does the patient have difficulty doing errands alone such as visiting a doctor's office or shopping?: No    Assessment & Plan  1. 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 Major depression in remission (HCC)  Continue medication  3. History of alcoholism (HCC)  He is cutting down   4. Partial epilepsy with impairment of consciousness (HCC)  No recent episodes   5. 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 Gwen Pounds for SOB but lost to follow up, discussed cardiac calcium score   6. Memory loss or impairment  - COMPLETE METABOLIC PANEL WITH GFR - CBC with Differential/Platelet - B12 and Folate Panel - Vitamin B1 - Syphilis Antibody Cascading Reflex  7. Essential (primary) hypertension  - losartan (COZAAR) 25 MG tablet; Take 1 tablet (25 mg total) by mouth daily.  Dispense: 90 tablet; Refill: 1  8. Dyslipidemia  - atorvastatin (LIPITOR) 80 MG tablet; Take 1 tablet (80 mg total) by mouth daily.  Dispense: 90 tablet; Refill: 0  9. Vitamin D  deficiency  - VITAMIN D 25 Hydroxy (Vit-D Deficiency, Fractures)

## 2022-11-05 NOTE — Patient Instructions (Addendum)
Instructions for [facility] Medications  One or more of your medications was sent to the [facility] mail order compounding pharmacy. You will receive an email from them and can purchase the medicine through that link. It will then be mailed to your home at the address you confirmed. If for any reason you do not receive an email from them, please check your spam folder. If you still do not find the email, please let us know. [facility] phone number is [phone].  Restart Singulair 10 mg take one tablet at night.   Start Allegra or Claritin over the counter once daily.   Due to recent changes in healthcare laws, you may see results of your pathology and/or laboratory studies on MyChart before the doctors have had a chance to review them. We understand that in some cases there may be results that are confusing or concerning to you. Please understand that not all results are received at the same time and often the doctors may need to interpret multiple results in order to provide you with the best plan of care or course of treatment. Therefore, we ask that you please give [place] 2 business days to thoroughly review all your results before contacting the office for clarification. Should we see a critical lab result, you will be contacted sooner.   If You Need Anything After Your Visit  If you have any questions or concerns for your doctor, please call our main line at [phone] and press option 4 to reach your doctor's medical assistant. If no one answers, please leave a voicemail as directed and we will return your call as soon as possible. Messages left after 4 pm will be answered the following business day.   You may also send [place] a message via MyChart. We typically respond to MyChart messages within 1-2 business days.  For prescription refills, please ask your pharmacy to contact our office. Our fax number is [phone].  If you have an urgent issue when the clinic is closed that  cannot wait until the next business day, you can page your doctor at the number below.    Please note that while we do our best to be available for urgent issues outside of office hours, we are not available 24/7.   If you have an urgent issue and are unable to reach [place], you may choose to seek medical care at your doctor's office, retail clinic, urgent care center, or emergency room.  If you have a medical emergency, please immediately call 911 or go to the emergency department.  Pager Numbers  - Dr. Gwen Pounds: [phone]  - Dr. Neale Burly: [phone]  - Dr. Roseanne Reno: [phone]  In the event of inclement weather, please call our main line at [phone] for an update on the status of any delays or closures.  Dermatology Medication Tips: Please keep the boxes that topical medications come in in order to help keep track of the instructions about where and how to use these. Pharmacies typically print the medication instructions only on the boxes and not directly on the medication tubes.   If your medication is too expensive, please contact our office at [phone] option 4 or send [place] a message through MyChart.   We are unable to tell what your co-pay for medications will be in advance as this is different depending on your insurance coverage. However, we may be able to find a substitute medication at lower cost or fill out paperwork to get insurance to cover a needed  medication.   If a prior authorization is required to get your medication covered by your insurance company, please allow [place] 1-2 business days to complete this process.  Drug prices often vary depending on where the prescription is filled and some pharmacies may offer cheaper prices.  The website www.goodrx.com contains coupons for medications through different pharmacies. The prices here do not account for what the cost may be with help from insurance (it may be cheaper with your insurance), but the website can give you the  price if you did not use any insurance.  - You can print the associated coupon and take it with your prescription to the pharmacy.  - You may also stop by our office during regular business hours and pick up a GoodRx coupon card.  - If you need your prescription sent electronically to a different pharmacy, notify our office through [facility] or by phone at [phone] option 4.     Si Usted Necesita Algo Despus de Su Visita  Tambin puede enviarnos un mensaje a travs de Clinical cytogeneticist. Por lo general respondemos a los mensajes de MyChart en el transcurso de 1 a 2 das hbiles.  Para renovar recetas, por favor pida a su farmacia que se ponga en contacto con nuestra oficina. Annie Sable de fax es [place] [phone].  Si tiene un asunto urgente cuando la clnica est cerrada y que no puede esperar hasta el siguiente da hbil, puede llamar/localizar a su doctor(a) al nmero que aparece a continuacin.   Por favor, tenga en cuenta que aunque hacemos todo lo posible para estar disponibles para asuntos urgentes fuera del horario de [place], no estamos disponibles las 24 horas del da, los 7 [address]  [address] de la [place].   Si tiene un problema urgente y no puede comunicarse con nosotros, puede optar por buscar atencin mdica  en el consultorio de su doctor(a), en una clnica privada, en un centro de atencin urgente o en una sala de emergencias.  Si tiene Engineer, drilling, por favor llame inmediatamente al 911 o vaya a la sala de emergencias.  Nmeros de bper  - Dr. Gwen Pounds: [phone]  - Dra. Moye: [phone]  - Dra. Roseanne Reno: [phone]  En caso de inclemencias del [place], por favor llame a Lacy Duverney principal al [phone] para una actualizacin sobre el [place] de cualquier retraso o cierre.  Consejos para la medicacin en dermatologa: Por favor, guarde las cajas en las que vienen los medicamentos de uso tpico para ayudarle a seguir las instrucciones sobre dnde y cmo  usarlos. Las farmacias generalmente imprimen las instrucciones del medicamento slo en las cajas y no directamente en los tubos del [place].   Si su medicamento es muy caro, por favor, pngase en contacto con Rolm Gala llamando al [phone] y presione la opcin 4 o envenos un mensaje a travs de Clinical cytogeneticist.   No podemos decirle cul ser su copago por los medicamentos por adelantado ya que esto es diferente dependiendo de la cobertura de su seguro. Sin embargo, es posible que podamos encontrar un medicamento sustituto a Audiological scientist un formulario para que el seguro cubra el medicamento que se considera necesario.   Si se requiere una autorizacin previa para que su compaa de seguros [place] su medicamento, por favor permtanos de 1 a 2 das hbiles para completar [address]  Los precios de los medicamentos varan con frecuencia dependiendo del Environmental consultant de dnde se surte la receta y alguna farmacias pueden ofrecer precios ms baratos.  El  sitio web www.goodrx.com tiene cupones para medicamentos de Health and safety inspector. Los precios aqu no tienen en cuenta lo que podra costar con la ayuda del seguro (puede ser ms barato con su seguro), pero el sitio web puede darle el precio si no utiliz Tourist information centre manager.  - Puede imprimir el cupn correspondiente y llevarlo con su receta a la farmacia.  - Tambin puede pasar por nuestra oficina durante el horario de atencin regular y Education officer, museum una tarjeta de cupones de GoodRx.  - Si necesita que su receta se enve electrnicamente a una farmacia diferente, informe a nuestra oficina a travs de MyChart de  o por telfono llamando al [phone] y presione la opcin 4.

## 2022-11-06 ENCOUNTER — Other Ambulatory Visit: Payer: Self-pay | Admitting: Family Medicine

## 2022-11-06 ENCOUNTER — Ambulatory Visit (INDEPENDENT_AMBULATORY_CARE_PROVIDER_SITE_OTHER): Payer: Medicare Other | Admitting: Family Medicine

## 2022-11-06 ENCOUNTER — Encounter: Payer: Self-pay | Admitting: Family Medicine

## 2022-11-06 VITALS — BP 116/74 | HR 95 | Temp 97.7°F | Resp 14 | Ht 65.0 in | Wt 156.8 lb

## 2022-11-06 DIAGNOSIS — E559 Vitamin D deficiency, unspecified: Secondary | ICD-10-CM

## 2022-11-06 DIAGNOSIS — F1021 Alcohol dependence, in remission: Secondary | ICD-10-CM | POA: Diagnosis not present

## 2022-11-06 DIAGNOSIS — I209 Angina pectoris, unspecified: Secondary | ICD-10-CM

## 2022-11-06 DIAGNOSIS — E785 Hyperlipidemia, unspecified: Secondary | ICD-10-CM

## 2022-11-06 DIAGNOSIS — N521 Erectile dysfunction due to diseases classified elsewhere: Secondary | ICD-10-CM

## 2022-11-06 DIAGNOSIS — R413 Other amnesia: Secondary | ICD-10-CM

## 2022-11-06 DIAGNOSIS — I10 Essential (primary) hypertension: Secondary | ICD-10-CM

## 2022-11-06 DIAGNOSIS — E1140 Type 2 diabetes mellitus with diabetic neuropathy, unspecified: Secondary | ICD-10-CM | POA: Diagnosis not present

## 2022-11-06 DIAGNOSIS — F325 Major depressive disorder, single episode, in full remission: Secondary | ICD-10-CM | POA: Diagnosis not present

## 2022-11-06 DIAGNOSIS — G40209 Localization-related (focal) (partial) symptomatic epilepsy and epileptic syndromes with complex partial seizures, not intractable, without status epilepticus: Secondary | ICD-10-CM

## 2022-11-06 DIAGNOSIS — E1159 Type 2 diabetes mellitus with other circulatory complications: Secondary | ICD-10-CM

## 2022-11-06 MED ORDER — ATORVASTATIN CALCIUM 80 MG PO TABS: 80.0000 mg | ORAL_TABLET | Freq: Every day | ORAL | 0 refills | Status: DC

## 2022-11-06 MED ORDER — LOSARTAN POTASSIUM 25 MG PO TABS: 25.0000 mg | ORAL_TABLET | Freq: Every day | ORAL | 1 refills | Status: DC

## 2022-11-06 NOTE — Patient Instructions (Addendum)
You must bring relative to doctor's visit and all your medications He also needs to see cardiologist - Dr. Elvera Bicker to his son Dannielle Huh during the visit and explained he cannot come alone to doctor's visits

## 2022-11-12 DIAGNOSIS — R42 Dizziness and giddiness: Secondary | ICD-10-CM | POA: Diagnosis not present

## 2022-11-12 DIAGNOSIS — R0602 Shortness of breath: Secondary | ICD-10-CM | POA: Diagnosis not present

## 2022-11-12 DIAGNOSIS — I25118 Atherosclerotic heart disease of native coronary artery with other forms of angina pectoris: Secondary | ICD-10-CM | POA: Diagnosis not present

## 2022-11-12 LAB — CBC WITH DIFFERENTIAL/PLATELET
Absolute Monocytes: 641 cells/uL (ref 200–950)
Basophils Absolute: 31 cells/uL (ref 0–200)
Basophils Relative: 0.5 %
Eosinophils Absolute: 61 cells/uL (ref 15–500)
Eosinophils Relative: 1 %
HCT: 48.7 % (ref 38.5–50.0)
Hemoglobin: 16.6 g/dL (ref 13.2–17.1)
Lymphs Abs: 1366 cells/uL (ref 850–3900)
MCH: 33.1 pg — ABNORMAL HIGH (ref 27.0–33.0)
MCHC: 34.1 g/dL (ref 32.0–36.0)
MCV: 97 fL (ref 80.0–100.0)
MPV: 9.3 fL (ref 7.5–12.5)
Monocytes Relative: 10.5 %
Neutro Abs: 4002 cells/uL (ref 1500–7800)
Neutrophils Relative %: 65.6 %
Platelets: 272 10*3/uL (ref 140–400)
RBC: 5.02 10*6/uL (ref 4.20–5.80)
RDW: 12.7 % (ref 11.0–15.0)
Total Lymphocyte: 22.4 %
WBC: 6.1 10*3/uL (ref 3.8–10.8)

## 2022-11-12 LAB — COMPLETE METABOLIC PANEL WITH GFR
AG Ratio: 1.7 (calc) (ref 1.0–2.5)
ALT: 47 U/L — ABNORMAL HIGH (ref 9–46)
AST: 26 U/L (ref 10–35)
Albumin: 4.5 g/dL (ref 3.6–5.1)
Alkaline phosphatase (APISO): 56 U/L (ref 35–144)
BUN: 17 mg/dL (ref 7–25)
CO2: 28 mmol/L (ref 20–32)
Calcium: 9.5 mg/dL (ref 8.6–10.3)
Chloride: 103 mmol/L (ref 98–110)
Creat: 0.96 mg/dL (ref 0.70–1.35)
Globulin: 2.6 g/dL (calc) (ref 1.9–3.7)
Glucose, Bld: 84 mg/dL (ref 65–99)
Potassium: 4.5 mmol/L (ref 3.5–5.3)
Sodium: 139 mmol/L (ref 135–146)
Total Bilirubin: 1 mg/dL (ref 0.2–1.2)
Total Protein: 7.1 g/dL (ref 6.1–8.1)
eGFR: 86 mL/min/{1.73_m2} (ref >=60)

## 2022-11-12 LAB — LIPID PANEL
Cholesterol: 201 mg/dL — ABNORMAL HIGH (ref <200)
HDL: 71 mg/dL (ref >=40)
LDL Cholesterol (Calc): 105 mg/dL (calc) — ABNORMAL HIGH
Non-HDL Cholesterol (Calc): 130 mg/dL (calc) — ABNORMAL HIGH (ref <130)
Total CHOL/HDL Ratio: 2.8 (calc) (ref <5.0)
Triglycerides: 139 mg/dL (ref <150)

## 2022-11-12 LAB — B12 AND FOLATE PANEL
Folate: 17.8 ng/mL
Vitamin B-12: 729 pg/mL (ref 200–1100)

## 2022-11-12 LAB — SYPHILIS ANTIBODY CASCADING REFLEX: T. PALLIDUM AB: NEGATIVE

## 2022-11-12 LAB — VITAMIN B1: Vitamin B1 (Thiamine): 10 nmol/L (ref 8–30)

## 2022-11-12 LAB — VITAMIN D 25 HYDROXY (VIT D DEFICIENCY, FRACTURES): Vit D, 25-Hydroxy: 31 ng/mL (ref 30–100)

## 2022-11-13 ENCOUNTER — Ambulatory Visit: Payer: Medicare Other | Admitting: Urology

## 2022-11-15 ENCOUNTER — Encounter: Payer: Self-pay | Admitting: Dermatology

## 2022-11-18 DIAGNOSIS — J4540 Moderate persistent asthma, uncomplicated: Secondary | ICD-10-CM | POA: Diagnosis not present

## 2022-11-18 DIAGNOSIS — G4719 Other hypersomnia: Secondary | ICD-10-CM | POA: Diagnosis not present

## 2022-11-30 ENCOUNTER — Emergency Department
Admission: EM | Admit: 2022-11-30 | Discharge: 2022-11-30 | Disposition: A | Discharge status: Home / Self Care | Payer: Medicare Other | Attending: Emergency Medicine | Admitting: Emergency Medicine

## 2022-11-30 ENCOUNTER — Emergency Department: Payer: Medicare Other

## 2022-11-30 ENCOUNTER — Other Ambulatory Visit: Payer: Self-pay

## 2022-11-30 ENCOUNTER — Encounter: Payer: Self-pay | Admitting: Intensive Care

## 2022-11-30 DIAGNOSIS — K209 Esophagitis, unspecified without bleeding: Secondary | ICD-10-CM | POA: Diagnosis not present

## 2022-11-30 DIAGNOSIS — I2510 Atherosclerotic heart disease of native coronary artery without angina pectoris: Secondary | ICD-10-CM | POA: Insufficient documentation

## 2022-11-30 DIAGNOSIS — K2970 Gastritis, unspecified, without bleeding: Secondary | ICD-10-CM | POA: Diagnosis not present

## 2022-11-30 DIAGNOSIS — K5730 Diverticulosis of large intestine without perforation or abscess without bleeding: Secondary | ICD-10-CM | POA: Diagnosis not present

## 2022-11-30 DIAGNOSIS — E1140 Type 2 diabetes mellitus with diabetic neuropathy, unspecified: Secondary | ICD-10-CM | POA: Diagnosis not present

## 2022-11-30 DIAGNOSIS — R197 Diarrhea, unspecified: Secondary | ICD-10-CM | POA: Diagnosis not present

## 2022-11-30 DIAGNOSIS — F0390 Unspecified dementia without behavioral disturbance: Secondary | ICD-10-CM | POA: Insufficient documentation

## 2022-11-30 DIAGNOSIS — I10 Essential (primary) hypertension: Secondary | ICD-10-CM | POA: Diagnosis not present

## 2022-11-30 DIAGNOSIS — N281 Cyst of kidney, acquired: Secondary | ICD-10-CM | POA: Diagnosis not present

## 2022-11-30 DIAGNOSIS — R109 Unspecified abdominal pain: Secondary | ICD-10-CM | POA: Diagnosis present

## 2022-11-30 DIAGNOSIS — K760 Fatty (change of) liver, not elsewhere classified: Secondary | ICD-10-CM | POA: Diagnosis not present

## 2022-11-30 LAB — URINALYSIS, ROUTINE W REFLEX MICROSCOPIC
Bacteria, UA: NONE SEEN
Bilirubin Urine: NEGATIVE
Glucose, UA: NEGATIVE mg/dL
Hgb urine dipstick: NEGATIVE
Ketones, ur: NEGATIVE mg/dL
Leukocytes,Ua: NEGATIVE
Nitrite: NEGATIVE
Protein, ur: 30 mg/dL — AB
Specific Gravity, Urine: 1.029 (ref 1.005–1.030)
pH: 5 (ref 5.0–8.0)

## 2022-11-30 LAB — COMPREHENSIVE METABOLIC PANEL
ALT: 48 U/L — ABNORMAL HIGH (ref 0–44)
AST: 32 U/L (ref 15–41)
Albumin: 4.5 g/dL (ref 3.5–5.0)
Alkaline Phosphatase: 73 U/L (ref 38–126)
Anion gap: 12 (ref 5–15)
BUN: 12 mg/dL (ref 8–23)
CO2: 22 mmol/L (ref 22–32)
Calcium: 9.8 mg/dL (ref 8.9–10.3)
Chloride: 101 mmol/L (ref 98–111)
Creatinine, Ser: 0.88 mg/dL (ref 0.61–1.24)
GFR, Estimated: >60 mL/min (ref >60)
Glucose, Bld: 130 mg/dL — ABNORMAL HIGH (ref 70–99)
Potassium: 3.6 mmol/L (ref 3.5–5.1)
Sodium: 135 mmol/L (ref 135–145)
Total Bilirubin: 0.9 mg/dL (ref 0.3–1.2)
Total Protein: 8 g/dL (ref 6.5–8.1)

## 2022-11-30 LAB — CBC
HCT: 55 % — ABNORMAL HIGH (ref 39.0–52.0)
Hemoglobin: 18.2 g/dL — ABNORMAL HIGH (ref 13.0–17.0)
MCH: 31.5 pg (ref 26.0–34.0)
MCHC: 33.1 g/dL (ref 30.0–36.0)
MCV: 95.3 fL (ref 80.0–100.0)
Platelets: 280 10*3/uL (ref 150–400)
RBC: 5.77 MIL/uL (ref 4.22–5.81)
RDW: 13.2 % (ref 11.5–15.5)
WBC: 10.6 10*3/uL — ABNORMAL HIGH (ref 4.0–10.5)
nRBC: 0 % (ref 0.0–0.2)

## 2022-11-30 LAB — LIPASE, BLOOD: Lipase: 42 U/L (ref 11–51)

## 2022-11-30 MED ORDER — HYDROMORPHONE HCL 1 MG/ML IJ SOLN
0.5000 mg | Freq: Once | INTRAMUSCULAR | Status: AC
  Administered 2022-11-30: 0.5 mg via INTRAVENOUS
  Filled 2022-11-30: qty 0.5

## 2022-11-30 MED ORDER — IOHEXOL 300 MG/ML  SOLN
100.0000 mL | Freq: Once | INTRAMUSCULAR | Status: AC | PRN
  Administered 2022-11-30: 100 mL via INTRAVENOUS

## 2022-11-30 MED ORDER — LACTATED RINGERS IV BOLUS
1000.0000 mL | Freq: Once | INTRAVENOUS | Status: AC
  Administered 2022-11-30: 1000 mL via INTRAVENOUS

## 2022-11-30 MED ORDER — ALUM & MAG HYDROXIDE-SIMETH 200-200-20 MG/5ML PO SUSP
30.0000 mL | Freq: Once | ORAL | Status: AC
  Administered 2022-11-30: 30 mL via ORAL
  Filled 2022-11-30: qty 30

## 2022-11-30 MED ORDER — FAMOTIDINE IN NACL 20-0.9 MG/50ML-% IV SOLN
20.0000 mg | Freq: Once | INTRAVENOUS | Status: AC
  Administered 2022-11-30: 20 mg via INTRAVENOUS
  Filled 2022-11-30: qty 50

## 2022-11-30 MED ORDER — PANTOPRAZOLE SODIUM 40 MG PO TBEC: 40.0000 mg | DELAYED_RELEASE_TABLET | Freq: Every day | ORAL | 0 refills | Status: DC

## 2022-11-30 NOTE — ED Triage Notes (Addendum)
Patient presents with abdominal pain and N/V/D. Brought over by [facility] for Ct scan. Patient also c/o frequent urination

## 2022-11-30 NOTE — Discharge Instructions (Addendum)
Please start taking the Protonix once daily.  Make sure you are taking this at least 30 minutes before a meal.  Medication does take several days to start to take effect.  He can also take an over-the-counter antacid such as Maalox or Tums for pain.  He is avoid alcohol and NSAIDs such as ibuprofen Motrin Aleve etc.  Returns or not improving please follow-up with the gastroenterologist as you may need to have an endoscopy to make sure you do not have an ulcer.

## 2022-11-30 NOTE — ED Provider Notes (Signed)
[facility] Provider Note    Event Date/Time   First MD Initiated Contact with Patient 11/30/22 1054     (approximate)   History   Abdominal Pain and Diarrhea   HPI  David Skinner is a 72 y.o. male possible history of hypertension, GERD, dementia, alcoholism who presents because of abdominal pain and diarrhea.  Symptoms been going on for 2 days.  Patient endorses generalized and periumbilical cramping abdominal pain.  Is constant but then has episodes where it acutely worsens.  He is having very frequent episodes of watery diarrhea that is nonbloody.  Has had chills but no fevers.  Has had a mild cough and feels generally fatigued.  He has not been able to eat much but just soft food.  Denies nausea or vomiting.  Patient does not recall having diarrhea in the past but patient's wife says he has had similar episodes before.  Reviewed last GI note from 11/04/2022.  Was noted that he was previously having significant diarrhea.  He had fecal calprotectin that was negative and it was thought that his diarrhea was due to high osmotic consumption from sodas that as well as the Aricept and duloxetine.  His diarrhea had improved at that point.  Patient does not recall this     Past Medical History:  Diagnosis Date   Allergy    Anxiety    Atopy    Cataract of right eye    [facility]   Chronic constipation    Elevated LFTs    Fatty liver    GERD (gastroesophageal reflux disease)    Hyperlipidemia    Hypertension    Hypoglycemia    Insomnia    Memory change    Overweight    Paresthesia of hand    Seizure (HCC)    12/12, 09/27/14, 02/28/16   Tenosynovitis of finger    Tinea cruris    Vitamin D deficiency     Patient Active Problem List   Diagnosis Date Noted   Hyperlipidemia, mixed 07/02/2021   Coronary artery disease involving native coronary artery of native heart 07/02/2021   Alcoholism (HCC) 03/10/2020   OSA (obstructive sleep apnea)  12/09/2016   RLS (restless legs syndrome) 12/09/2016   Major depression in remission (HCC) 12/09/2016   Cervical radiculitis 01/09/2016   Lumbosacral radiculitis 01/09/2016   Osteoarthritis of carpometacarpal (CMC) joint of thumb 01/09/2016   Tinea cruris 03/21/2015   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) 03/09/2015   Chronic constipation 11/28/2014   Insomnia 11/28/2014   Dyslipidemia 11/28/2014   Fatty infiltration of liver 11/28/2014   Essential (primary) hypertension 11/28/2014   Gastric reflux 11/28/2014   Memory loss or impairment 11/28/2014   Perennial allergic rhinitis with seasonal variation 11/28/2014   Vitamin D deficiency 11/28/2014   Tenosynovitis of thumb 11/28/2014   Partial epilepsy with impairment of consciousness (HCC) 11/16/2014     Physical Exam  Triage Vital Signs: ED Triage Vitals  Enc Vitals Group     BP 11/30/22 1004 (!) 149/98     Pulse Rate 11/30/22 1004 83     Resp 11/30/22 1004 16     Temp 11/30/22 1004 98.2 F (36.8 C)     Temp Source 11/30/22 1004 Oral     SpO2 11/30/22 1004 95 %     Weight 11/30/22 1001 150 lb (68 kg)     Height 11/30/22 1001 5\' 5"  (1.651 m)     Head Circumference --  Peak Flow --      Pain Score 11/30/22 1000 8     Pain Loc --      Pain Edu? --      Excl. in GC? --     Most recent vital signs: Vitals:   11/30/22 1004  BP: (!) 149/98  Pulse: 83  Resp: 16  Temp: 98.2 F (36.8 C)  SpO2: 95%     General: Awake, intermittently grimacing in pain CV:  Good peripheral perfusion.  Resp:  Normal effort.  Abd:  No distention.  Mild tenderness throughout particularly suprapubic and right lower quadrant but no guarding abdomen is soft Neuro:             Awake, Alert, Oriented x 3  Other:     ED Results / Procedures / Treatments  Labs (all labs ordered are listed, but only abnormal results are displayed) Labs Reviewed  COMPREHENSIVE METABOLIC PANEL - Abnormal; Notable for  the following components:      Result Value   Glucose, Bld 130 (*)    ALT 48 (*)    All other components within normal limits  CBC - Abnormal; Notable for the following components:   WBC 10.6 (*)    Hemoglobin 18.2 (*)    HCT 55.0 (*)    All other components within normal limits  URINALYSIS, ROUTINE W REFLEX MICROSCOPIC - Abnormal; Notable for the following components:   Color, Urine YELLOW (*)    APPearance HAZY (*)    Protein, ur 30 (*)    All other components within normal limits  GASTROINTESTINAL PANEL BY PCR, STOOL (REPLACES STOOL CULTURE)  C DIFFICILE QUICK SCREEN W PCR REFLEX    LIPASE, BLOOD     EKG     RADIOLOGY I reviewed interpreted patient CT of the abdomen pelvis which is negative for acute surgical process, does show inflammation in the stomach   PROCEDURES:  Critical Care performed: No  Procedures    MEDICATIONS ORDERED IN ED: Medications  lactated ringers bolus 1,000 mL (has no administration in time range)  HYDROmorphone (DILAUDID) injection 0.5 mg (has no administration in time range)     IMPRESSION / MDM / ASSESSMENT AND PLAN / ED COURSE  I reviewed the triage vital signs and the nursing notes.                              Patient's presentation is most consistent with acute complicated illness / injury requiring diagnostic workup.  Differential diagnosis includes, but is not limited to, infectious diarrhea, colitis, diverticulitis, mesenteric ischemia/ischemic colitis, osmotic diarrhea, appendicitis  Patient is a 72 year old male who presents because of 2 days of generalized abdominal pain and frequent nonbloody diarrhea.  His pain is generalized comes and goes and is cramping in nature associated with anorexia and chills.  Diarrhea is nonbloody he is having episodes very frequently cannot entirely quantify.  Denies travel out of the country or recent anabiotic use.  Patient does have history of diarrhea in the past that GI felt was likely due  to high osmotic load in his diet.  Patient is mildly hypertensive but vitals are otherwise reassuring.  On exam he looks nontoxic but is intermittently grimacing secondary to pain.  Abdominal exam has tenderness throughout but is benign and nonperitoneal.  Reviewed patient's labs he has mild leukocytosis but hemoglobin also increased suspect hemoconcentration.  CMP and lipase are reassuring urinalysis negative.  I have ordered  GI PCR and C. difficile.  Will obtain CT of the abdomen pelvis give a bolus of fluid and Dilaudid.  CT of the abdomen pelvis is consistent with gastritis/esophagitis with wall thickening and edema of the antrum and distal esophagus.  Patient does drink alcohol but only on the weekends now this could certainly be contributing.  On reassessment he continues to have some pain but is feeling improved.  Abdominal exam is benign.  Will give GI cocktail and Pepcid.  Discussed alcohol cessation and avoiding NSAIDs.  Will start him on high-dose PPI and refer him back to GI as he may need to have an endoscopy if symptoms or not improving with PPI.  Patient feeling improved after GI cocktail.  Tolerating p.o.  Repeat abdominal exam is benign.        FINAL CLINICAL IMPRESSION(S) / ED DIAGNOSES   Final diagnoses:  Diarrhea, unspecified type     Rx / DC Orders   ED Discharge Orders     None        Note:  This document was prepared using Dragon voice recognition software and may include unintentional dictation errors.   Georga Hacking, MD 11/30/22 1339

## 2022-12-03 DIAGNOSIS — R0602 Shortness of breath: Secondary | ICD-10-CM | POA: Diagnosis not present

## 2022-12-03 DIAGNOSIS — I10 Essential (primary) hypertension: Secondary | ICD-10-CM | POA: Diagnosis not present

## 2022-12-03 DIAGNOSIS — I38 Endocarditis, valve unspecified: Secondary | ICD-10-CM | POA: Diagnosis not present

## 2022-12-03 DIAGNOSIS — Z9181 History of falling: Secondary | ICD-10-CM | POA: Diagnosis not present

## 2022-12-03 DIAGNOSIS — E782 Mixed hyperlipidemia: Secondary | ICD-10-CM | POA: Diagnosis not present

## 2022-12-03 DIAGNOSIS — G4733 Obstructive sleep apnea (adult) (pediatric): Secondary | ICD-10-CM | POA: Diagnosis not present

## 2022-12-03 DIAGNOSIS — I25118 Atherosclerotic heart disease of native coronary artery with other forms of angina pectoris: Secondary | ICD-10-CM | POA: Diagnosis not present

## 2022-12-03 DIAGNOSIS — R42 Dizziness and giddiness: Secondary | ICD-10-CM | POA: Diagnosis not present

## 2022-12-04 ENCOUNTER — Telehealth: Payer: Self-pay | Admitting: *Deleted

## 2022-12-04 ENCOUNTER — Ambulatory Visit: Payer: Self-pay

## 2022-12-04 NOTE — Transitions of Care (Post Inpatient/ED Visit) (Signed)
   12/04/2022  Name: David Skinner MRN: [id number] DOB: [birth year]  Today's TOC FU Call Status: Today's TOC FU Call Status:: Unsuccessul Call (1st Attempt) Unsuccessful Call (1st Attempt) Date: 12/04/22  Attempted to reach the patient regarding the most recent Inpatient/ED visit.  [place] Interpreter # [phone] [place]   Follow Up Plan: Additional outreach attempts will be made to reach the patient to complete the Transitions of Care (Post Inpatient/ED visit) call.   Gean Maidens BSN RN [facility] Care Management [phone]

## 2022-12-04 NOTE — Chronic Care Management (AMB) (Signed)
   12/04/2022  Race David Skinner June 12, 1951 [id number]   Reason for Encounter: Patient is not currently enrolled in the CCM program. CCM enrollment status updated.   [name]/Chronic Care Management [phone]

## 2022-12-05 ENCOUNTER — Telehealth: Payer: Self-pay | Admitting: *Deleted

## 2022-12-05 ENCOUNTER — Encounter: Payer: Self-pay | Admitting: *Deleted

## 2022-12-05 NOTE — Transitions of Care (Post Inpatient/ED Visit) (Signed)
   12/05/2022  Name: Fardeen Treasure MRN: [id number] DOB: [birth year]  Today's TOC FU Call Status: Today's TOC FU Call Status:: Unsuccessful Call (2nd Attempt) Unsuccessful Call (2nd Attempt) Date: 12/05/22  ED EMMI Red Alert notification on 12/04/22 from ED visit 11/30/22- EMMI call placed 12/02/22: "No discharge instructions"  Attempted to reach the patient regarding the most recent ED visit; Entirety of call today facilitated by [employer]; Interpreter "Ray" ID [phone]: Recieved automated voice message stating, "the person you are calling has a voice mail box that has not been set up yet; please try your call again later;"  unable to leave voice message requesting call back  Follow Up Plan: Additional outreach attempts will be made to reach the patient to complete the Transitions of Care (Post ED visit) call.   Caryl Pina, RN, BSN, CCRN Alumnus RN CM Care Coordination/ Transition of Care- [facility] Care Management [phone]: direct office

## 2022-12-06 ENCOUNTER — Encounter: Payer: Self-pay | Admitting: *Deleted

## 2022-12-06 ENCOUNTER — Telehealth: Payer: Self-pay | Admitting: *Deleted

## 2022-12-06 NOTE — Transitions of Care (Post Inpatient/ED Visit) (Signed)
12/06/2022  Name: David Skinner MRN: [id number] DOB: 1950-08-03  Today's TOC FU Call Status: Today's TOC FU Call Status:: Successful TOC FU Call Competed TOC FU Call Complete Date: 12/06/22  ED EMMI Red Alert notification on 12/04/22 from ED visit 11/30/22- EMMI call placed 12/02/22: "No discharge instructions"   Transition Care Management Follow-up Telephone Call Date of Discharge: 11/30/22 Discharge Facility: [facility]) Type of Discharge: Emergency Department Reason for ED Visit: Other: (diarrhea; gastritis) How have you been since you were released from the hospital?: Better (per spouse: "He is better, not having any issues anymore.  I am glad you called to tell me he has this docto appointment-- I did not know about that.  I will make sure he goes to the appointment") Any questions or concerns?: No  Items Reviewed: Did you receive and understand the discharge instructions provided?: Yes Medications obtained,verified, and reconciled?: No Medications Not Reviewed Reasons:: Other: (spouse not with patient and not near medications- declines medication review) Any new allergies since your discharge?: No Dietary orders reviewed?: Yes Type of Diet Ordered:: "Regular" Do you have support at home?: Yes People in Home: spouse Name of Support/Comfort Primary Source: Reports independent in self-care activities; spouse assists as/ if needed/ indicated  Medications Reviewed Today: Medications Reviewed Today     Reviewed by Michaela Corner, RN (Registered Nurse) on 12/06/22 at 1536  Med List Status: <None>   Medication Order Taking? Sig Documenting Provider Last Dose Status Informant  alfuzosin (UROXATRAL) 10 MG 24 hr tablet [id number] No Take 10 mg by mouth daily with breakfast. [provider] Taking Active   ASPIRIN 81 PO [id number] No Take 1 tablet by mouth daily. [provider] Taking Active   atorvastatin (LIPITOR) 80 MG tablet [id number]   Take 1 tablet (80 mg total) by mouth daily. Alba Cory, MD  Active   donepezil (ARICEPT) 10 MG tablet [id number] No Take 10 mg by mouth at bedtime. Lonell Face, MD Taking Active            Med Note Julious Payer A   Wed Aug 08, 2021 10:00 AM) Prescribed by Dr. Sherryll Burger  DULoxetine (CYMBALTA) 60 MG capsule [id number] No TAKE 1 CAPSULE BY MOUTH EVERY DAY Alba Cory, MD Taking Active   isosorbide mononitrate (IMDUR) 30 MG 24 hr tablet [id number] No Take 30 mg by mouth daily. [provider] Taking Active   losartan (COZAAR) 25 MG tablet [id number]  Take 1 tablet (25 mg total) by mouth daily. Alba Cory, MD  Active   memantine [facility]) 5 MG tablet [id number] No Take 5 mg by mouth 2 (two) times daily. Lonell Face, MD Taking Active   montelukast (SINGULAIR) 10 MG tablet [id number] No Take 10 mg by mouth at bedtime. [provider] Taking Active   pantoprazole (PROTONIX) 40 MG tablet [id number]  Take 1 tablet (40 mg total) by mouth daily. Georga Hacking, MD  Active   traZODone (DESYREL) 100 MG tablet [id number] No TAKE 1 TABLET BY MOUTH EVERYDAY AT BEDTIME Alba Cory, MD Taking Active   Vibegron [facility]) 75 MG TABS [id number] No Take 1 tablet (75 mg total) by mouth daily. Riki Altes, MD Taking Active            Home Care and Equipment/Supplies: Were Home Health Services Ordered?: No Any new equipment or medical supplies ordered?: No  Functional Questionnaire: Do you need assistance with bathing/showering or dressing?: No Do you need assistance with meal  preparation?: No Do you need assistance with eating?: No Do you have difficulty maintaining continence: No Do you need assistance with getting out of bed/getting out of a chair/moving?: No Do you have difficulty managing or taking your medications?: No  Follow up appointments reviewed: PCP Follow-up appointment confirmed?: Yes Date of PCP follow-up appointment?: 12/12/22 Follow-up Provider:  PCP Specialist Hospital Follow-up appointment confirmed?: Yes Date of Specialist follow-up appointment?: 12/03/22 Follow-Up Specialty Provider:: cardiologist Do you need transportation to your follow-up appointment?: No Do you understand care options if your condition(s) worsen?: Yes-patient verbalized understanding  SDOH Interventions Today    Flowsheet Row Most Recent Value  SDOH Interventions   Food Insecurity Interventions Intervention Not Indicated  Transportation Interventions Intervention Not Indicated  [spouse reports patient drives self]      TOC Interventions Today    Flowsheet Row Most Recent Value  TOC Interventions   TOC Interventions Discussed/Reviewed TOC Interventions Discussed      Interventions Today    Flowsheet Row Most Recent Value  Chronic Disease   Chronic disease during today's visit Other  [diarrhea,  gastritis]  General Interventions   General Interventions Discussed/Reviewed General Interventions Discussed, Doctor Visits  Doctor Visits Discussed/Reviewed Specialist, Doctor Visits Discussed, PCP  PCP/Specialist Visits Compliance with follow-up visit  Nutrition Interventions   Nutrition Discussed/Reviewed Nutrition Discussed  Pharmacy Interventions   Pharmacy Dicussed/Reviewed Pharmacy Topics Discussed  [spouse declines medication review,  not with patient or near medications- denies concerns around medications]      Caryl Pina, RN, BSN, CCRN Alumnus RN CM Care Coordination/ Transition of Care- [facility] Care Management [phone]: direct office

## 2022-12-11 NOTE — Progress Notes (Unsigned)
Name: David Skinner   MRN: [id number]    DOB: 1950/09/27   Date:12/12/2022       Progress Note  Subjective  Chief Complaint  Follow Up  He came in today with his wife and also an interpreter : Enid Derry  HPI  Diarrhea: seen by GI and symptoms improved , only has bowel movements once or twice a day once he stopped drinking sodas   Major depression  in remission : going on for years but  worse in  2018, he was on Cymbalta since April 2018, he is taking Duloxetine , he has dementia - wife states up to two weeks ago he was still drinking very heavily, over the past couple of weeks only drinking on weekends   Hyperlipidemia :he is taking Atorvastatin  80 mg daily No myalgia or chest pain. Last LDL went up from 59 to 80 , he states compliant with medication but his memory is poor,  his step son is going to start dispensing his medication, and we will recheck next visit    OSA/minimal/RLS/Memory loss :he had a  sleep study back in 2018 it showed minimal sleep apnea and RLS - he is up to date with appointments with Dr. Sherryll Burger. Last labs within normal limits   HTN: BP is slightly above his bp, he takes losartan 25 mg daily . He denies side effects, his bp is still low but it was normal during recent visit with cardiologist and high when he went to [facility]. We will continue current regiment   Alcoholism  : he used to drink heavy while in [place] used to drink liquor at least 3 times a week and used to get drunk, when he moved to [place] ( 1979) . He was still drinking in [place] usually beer , wife came in today and explained he was still drinking heavily - liquor and beer on weekends usually two days per week, but he went to [facility] a couple of weeks ago, CT showed possible gastritis, he is only drinking a couple of beers on weekends since.    Urinary frequency: under the care of Urologist, but lost to follow up, seems like the only medication was Cena Benton but last fill was April 2024    Partial Seizure: Last episode 02/2016  and went to [facility], he was taking medication given by Dr. Sherryll Burger he stopped taking lamictal, but he brought a picture of his medications and looks like he is taking it. 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   He  denies polyphagia or polydipsia, has urinary frequency due to LUTS  Continues statin therapy. A1C was 6.6 %, today it is  6%    Insomnia: he sleeps well with Trazodone, no side effects .    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) 03/09/2015   Chronic constipation 11/28/2014   Insomnia 11/28/2014   Dyslipidemia 11/28/2014   Fatty infiltration of liver 11/28/2014   Essential (primary) hypertension 11/28/2014   Gastric reflux 11/28/2014   Memory loss or impairment 11/28/2014   Perennial allergic rhinitis with seasonal variation 11/28/2014   Vitamin D deficiency 11/28/2014   Tenosynovitis of thumb 11/28/2014   Partial epilepsy with impairment of consciousness (HCC) 11/16/2014    Past Surgical History:  Procedure Laterality Date   COLONOSCOPY WITH PROPOFOL N/A 03/25/2017   Procedure: COLONOSCOPY WITH PROPOFOL;  Surgeon: Toney Reil, MD;  Location: [facility];  Service: Endoscopy;  Laterality: N/A;   COLONOSCOPY  WITH PROPOFOL N/A 01/16/2021   Procedure: COLONOSCOPY WITH PROPOFOL;  Surgeon: Wyline Mood, MD;  Location[facility];  Service: Gastroenterology;  Laterality: N/A;  USE VIET[place]ESE VIDEO INTERPRETER   ESOPHAGOGASTRODUODENOSCOPY N/A 03/25/2017   Procedure: ESOPHAGOGASTRODUODENOSCOPY (EGD);  Surgeon: Toney Reil, MD;  Location: [facility];  Service: Endoscopy;  Laterality: N/A;  Diabetic - oral meds   LASIK Bilateral     Family History  Problem Relation Age of Onset   Diabetes Mother    Heart disease Mother     Social History   Tobacco Use   Smoking status: Former    Packs/day: 1.00    Years: 15.00    Additional pack years: 0.00    Total pack years: 15.00    Types: Cigarettes    Start date: 06/17/1984    Quit date: 11/28/1999    Years since quitting: 23.0   Smokeless tobacco: Never  Substance Use Topics   Alcohol use: Not Currently    Alcohol/week: 10.0 standard drinks of alcohol    Types: 5 Cans of beer, 5 Shots of liquor per week     Current Outpatient Medications:    alfuzosin (UROXATRAL) 10 MG 24 hr tablet, Take 10 mg by mouth daily with breakfast., Disp: , Rfl:    ASPIRIN 81 PO, Take 1 tablet by mouth daily., Disp: , Rfl:    atorvastatin (LIPITOR) 80 MG tablet, Take 1 tablet (80 mg total) by mouth daily., Disp: 90 tablet, Rfl: 0   donepezil (ARICEPT) 10 MG tablet, Take 10 mg by mouth at bedtime., Disp: , Rfl:    DULoxetine (CYMBALTA) 60 MG capsule, TAKE 1 CAPSULE BY MOUTH EVERY DAY, Disp: 90 capsule, Rfl: 1   isosorbide mononitrate (IMDUR) 30 MG 24 hr tablet, Take 30 mg by mouth daily., Disp: , Rfl:    losartan (COZAAR) 25 MG tablet, Take 1 tablet (25 mg total) by mouth daily., Disp: 90 tablet, Rfl: 1   memantine (NAMENDA) 5 MG tablet, Take 5 mg by mouth 2 (two) times daily., Disp: , Rfl:    montelukast (SINGULAIR) 10 MG tablet, Take 10 mg by mouth at bedtime., Disp: , Rfl:    pantoprazole (PROTONIX) 40 MG tablet, Take 1 tablet (40 mg total) by mouth  daily., Disp: 30 tablet, Rfl: 0   traZODone (DESYREL) 100 MG tablet, TAKE 1 TABLET BY MOUTH EVERYDAY AT BEDTIME, Disp: 90 tablet, Rfl: 0   Vibegron (GEMTESA) 75 MG TABS, Take 1 tablet (75 mg total) by mouth daily., Disp: 30 tablet, Rfl: 0  Allergies  Allergen Reactions   Ibuprofen Itching   Levofloxacin    Lisinopril Cough   Metoprolol    Nsaids Itching   Tolmetin Itching   Tramadol Hcl Itching   Tylenol [Acetaminophen] Itching    I personally reviewed active  problem list, medication list, allergies, family history, social history, health maintenance with the patient/caregiver today.   ROS  Ten systems reviewed and is negative except as mentioned in HPI   Objective  Vitals:   12/12/22 1003  BP: 116/70  Pulse: 91  Resp: 16  SpO2: 97%  Weight: 153 lb (69.4 kg)  Height: 5\' 5"  (1.651 m)    Body mass index is 25.46 kg/m.  Physical Exam  Constitutional: Patient appears well-developed and well-nourished.  No distress.  HEENT: head atraumatic, normocephalic, pupils equal and reactive to light, neck supple Cardiovascular: Normal rate, regular rhythm and normal heart sounds.  No murmur heard. No BLE edema. Pulmonary/Chest: Effort normal and breath sounds normal. No respiratory distress. Abdominal: Soft.  There is no tenderness. Psychiatric: Patient has a normal mood and affect. Seems to not really understand recommendations even with interpreter and wife in the room    PHQ2/9:    12/12/2022   10:02 AM 11/06/2022   10:08 AM 08/07/2022   11:06 AM 03/11/2022    2:51 PM 12/05/2021    8:47 AM  Depression screen PHQ 2/9  Decreased Interest 0 0 0 0 0  Down, Depressed, Hopeless 0 0 0 0 0  PHQ - 2 Score 0 0 0 0 0  Altered sleeping 3 0 0 0 0  Tired, decreased energy 3 0 0 0 1  Change in appetite 1 0 0 0 3  Feeling bad or failure about yourself  0 0 0 0 0  Trouble concentrating 1 0 0 0 0  Moving slowly or fidgety/restless 0 0 0 0 0  Suicidal thoughts 0 0 0 0 0  PHQ-9 Score 8  0 0 0 4  Difficult doing work/chores    Not difficult at all     phq 9 is positive   Fall Risk:    12/12/2022   10:02 AM 11/06/2022   10:05 AM 08/07/2022   11:06 AM 03/11/2022    2:51 PM 12/05/2021    8:47 AM  Fall Risk   Falls in the past year? 0 0 0 0 0  Number falls in past yr: 0   0 0  Injury with Fall? 0   0 0  Risk for fall due to : No Fall Risks No Fall Risks No Fall Risks  No Fall Risks  Follow up Falls prevention discussed Falls prevention discussed Falls prevention discussed  Falls prevention discussed      Functional Status Survey: Is the patient deaf or have difficulty hearing?: No Does the patient have difficulty seeing, even when wearing glasses/contacts?: No Does the patient have difficulty concentrating, remembering, or making decisions?: No Does the patient have difficulty walking or climbing stairs?: No Does the patient have difficulty dressing or bathing?: No Does the patient have difficulty doing errands alone such as visiting a doctor's office or shopping?: No    Assessment & Plan  1. Essential (primary) hypertension  BP is towards low end of normal here bu at goal at recent visits with other sub-specialists, advised to monitor at home and also to let me know if orthostatic changes   2. Major depression mild recurrent (HCC)  He is taking Duloxetine, explained medication does not work well if drinking heavily. He has been drinking less over the past two weeks Phq 9 positive  3. 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 Angina pectoris associated with type 2 diabetes mellitus (HCC)  Taking  Imudr and symptoms are under control   5. Alcoholism (HCC)  Explained importance of quitting drinking, it can cause memory loss   6. Vitamin D deficiency  Continue vitamin D deficiency   7. OSA (obstructive sleep apnea)  Under the care of Dr. Sherryll Burger  8. Memory loss or impairment  Under the care of  Dr. Sherryll Burger  9. Gastritis without bleeding, unspecified chronicity, unspecified gastritis type   Seen on recent CT - he was drinking heavily the weekend he went to [facility]

## 2022-12-12 ENCOUNTER — Ambulatory Visit (INDEPENDENT_AMBULATORY_CARE_PROVIDER_SITE_OTHER): Payer: Medicare Other | Admitting: Family Medicine

## 2022-12-12 ENCOUNTER — Encounter: Payer: Self-pay | Admitting: Family Medicine

## 2022-12-12 VITALS — BP 116/70 | HR 91 | Resp 16 | Ht 65.0 in | Wt 153.0 lb

## 2022-12-12 DIAGNOSIS — E1159 Type 2 diabetes mellitus with other circulatory complications: Secondary | ICD-10-CM | POA: Diagnosis not present

## 2022-12-12 DIAGNOSIS — G4733 Obstructive sleep apnea (adult) (pediatric): Secondary | ICD-10-CM

## 2022-12-12 DIAGNOSIS — F330 Major depressive disorder, recurrent, mild: Secondary | ICD-10-CM

## 2022-12-12 DIAGNOSIS — N521 Erectile dysfunction due to diseases classified elsewhere: Secondary | ICD-10-CM

## 2022-12-12 DIAGNOSIS — F1020 Alcohol dependence, uncomplicated: Secondary | ICD-10-CM

## 2022-12-12 DIAGNOSIS — I10 Essential (primary) hypertension: Secondary | ICD-10-CM

## 2022-12-12 DIAGNOSIS — R413 Other amnesia: Secondary | ICD-10-CM

## 2022-12-12 DIAGNOSIS — E1140 Type 2 diabetes mellitus with diabetic neuropathy, unspecified: Secondary | ICD-10-CM

## 2022-12-12 DIAGNOSIS — E559 Vitamin D deficiency, unspecified: Secondary | ICD-10-CM

## 2022-12-12 DIAGNOSIS — I209 Angina pectoris, unspecified: Secondary | ICD-10-CM

## 2022-12-12 DIAGNOSIS — K2970 Gastritis, unspecified, without bleeding: Secondary | ICD-10-CM

## 2022-12-12 LAB — POCT GLYCOSYLATED HEMOGLOBIN (HGB A1C): Hemoglobin A1C: 6 % — AB (ref 4.0–5.6)

## 2022-12-12 NOTE — Patient Instructions (Addendum)
Dr. Lonna Cobb is the prostate bladder - please call and schedule a follow up  Go to local pharmacy for Tdap

## 2022-12-16 ENCOUNTER — Other Ambulatory Visit: Payer: Self-pay | Admitting: *Deleted

## 2022-12-16 DIAGNOSIS — R42 Dizziness and giddiness: Secondary | ICD-10-CM | POA: Diagnosis not present

## 2022-12-16 DIAGNOSIS — J4540 Moderate persistent asthma, uncomplicated: Secondary | ICD-10-CM | POA: Diagnosis not present

## 2022-12-16 MED ORDER — GEMTESA 75 MG PO TABS: 75.0000 mg | ORAL_TABLET | Freq: Every day | ORAL | 11 refills | Status: DC

## 2022-12-17 DIAGNOSIS — J4540 Moderate persistent asthma, uncomplicated: Secondary | ICD-10-CM | POA: Diagnosis not present

## 2022-12-26 ENCOUNTER — Encounter: Payer: Self-pay | Admitting: Urology

## 2022-12-26 ENCOUNTER — Other Ambulatory Visit: Payer: Self-pay | Admitting: *Deleted

## 2022-12-26 MED ORDER — GEMTESA 75 MG PO TABS: 75.0000 mg | ORAL_TABLET | Freq: Every day | ORAL | 11 refills | Status: DC

## 2023-01-01 ENCOUNTER — Other Ambulatory Visit: Payer: Self-pay | Admitting: Family Medicine

## 2023-01-01 DIAGNOSIS — F5101 Primary insomnia: Secondary | ICD-10-CM

## 2023-01-09 DIAGNOSIS — R058 Other specified cough: Secondary | ICD-10-CM | POA: Diagnosis not present

## 2023-01-09 DIAGNOSIS — Z8709 Personal history of other diseases of the respiratory system: Secondary | ICD-10-CM | POA: Diagnosis not present

## 2023-01-09 DIAGNOSIS — J209 Acute bronchitis, unspecified: Secondary | ICD-10-CM | POA: Diagnosis not present

## 2023-01-09 DIAGNOSIS — R0789 Other chest pain: Secondary | ICD-10-CM | POA: Diagnosis not present

## 2023-01-21 DIAGNOSIS — G4733 Obstructive sleep apnea (adult) (pediatric): Secondary | ICD-10-CM | POA: Diagnosis not present

## 2023-01-28 DIAGNOSIS — H40003 Preglaucoma, unspecified, bilateral: Secondary | ICD-10-CM | POA: Diagnosis not present

## 2023-01-28 DIAGNOSIS — H2513 Age-related nuclear cataract, bilateral: Secondary | ICD-10-CM | POA: Diagnosis not present

## 2023-01-29 ENCOUNTER — Other Ambulatory Visit: Payer: Self-pay | Admitting: Urology

## 2023-02-05 ENCOUNTER — Other Ambulatory Visit: Payer: Self-pay | Admitting: Family Medicine

## 2023-02-05 DIAGNOSIS — F325 Major depressive disorder, single episode, in full remission: Secondary | ICD-10-CM

## 2023-02-10 DIAGNOSIS — G4733 Obstructive sleep apnea (adult) (pediatric): Secondary | ICD-10-CM | POA: Diagnosis not present

## 2023-02-10 DIAGNOSIS — I2510 Atherosclerotic heart disease of native coronary artery without angina pectoris: Secondary | ICD-10-CM | POA: Diagnosis not present

## 2023-02-10 DIAGNOSIS — R42 Dizziness and giddiness: Secondary | ICD-10-CM | POA: Diagnosis not present

## 2023-02-10 DIAGNOSIS — I6523 Occlusion and stenosis of bilateral carotid arteries: Secondary | ICD-10-CM | POA: Diagnosis not present

## 2023-02-10 DIAGNOSIS — E782 Mixed hyperlipidemia: Secondary | ICD-10-CM | POA: Diagnosis not present

## 2023-02-10 DIAGNOSIS — I10 Essential (primary) hypertension: Secondary | ICD-10-CM | POA: Diagnosis not present

## 2023-02-10 DIAGNOSIS — R296 Repeated falls: Secondary | ICD-10-CM | POA: Diagnosis not present

## 2023-02-10 DIAGNOSIS — R0602 Shortness of breath: Secondary | ICD-10-CM | POA: Diagnosis not present

## 2023-02-10 DIAGNOSIS — I38 Endocarditis, valve unspecified: Secondary | ICD-10-CM | POA: Diagnosis not present

## 2023-02-12 ENCOUNTER — Ambulatory Visit: Payer: Medicare Other | Admitting: Dermatology

## 2023-02-12 ENCOUNTER — Encounter: Payer: Self-pay | Admitting: Dermatology

## 2023-02-12 DIAGNOSIS — Z7189 Other specified counseling: Secondary | ICD-10-CM

## 2023-02-12 DIAGNOSIS — L2081 Atopic neurodermatitis: Secondary | ICD-10-CM | POA: Diagnosis not present

## 2023-02-12 DIAGNOSIS — Z79899 Other long term (current) drug therapy: Secondary | ICD-10-CM

## 2023-02-12 DIAGNOSIS — T07XXXA Unspecified multiple injuries, initial encounter: Secondary | ICD-10-CM

## 2023-02-12 DIAGNOSIS — L819 Disorder of pigmentation, unspecified: Secondary | ICD-10-CM

## 2023-02-12 NOTE — Progress Notes (Signed)
   Follow-Up Visit   Subjective  David Skinner is a 72 y.o. male who presents for the following: Atopic Dermatitis. 3 month follow up. Face. Patient c/o dryness and itching so he scratches at areas. Using Mometasone cream 5 days a week, helps with itching but he picks at it when its dry. Did not get [facility]  Amitriptyline: 5%, Gabapentin: 10%, Lidocaine: 5%,  cream. Unsure if he received email.  Re-started Singulair and started Allegra.  Interpreter with patient.   The following portions of the chart were reviewed this encounter and updated as appropriate: medications, allergies, medical history  Review of Systems:  No other skin or systemic complaints except as noted in HPI or Assessment and Plan.  Objective  Well appearing patient in no apparent distress; mood and affect are within normal limits.  Areas Examined: Scalp, face, neck  Relevant physical exam findings are noted in the Assessment and Plan.       Assessment & Plan   Medication management  Counseling and coordination of care  Atopic neurodermatitis  Multiple excoriations  Dyschromia     ATOPIC NEURODERMATITIS With lichen simplex chronicus, excoriations, lichenification and dyschromia  Patient admits to rubbing and scratching at areas along his temples scalp and cheeks Photos compared from before and today show he is improving. Exam: 2 crusts on left and right, lichenification, erythema and hyperpigmentation at sides of face and scalp. Photos show improvement   2% BSA  Chronic and persistent condition with duration or expected duration over one year. Condition is improving with treatment but not currently at goal.   Atopic dermatitis (eczema) is a chronic, relapsing, pruritic condition that can significantly affect quality of life. It is often associated with allergic rhinitis and/or asthma and can require treatment with topical medications, phototherapy, or in severe cases biologic injectable  medication (Dupixent; Adbry) or Oral JAK inhibitors.  Treatment Plan: Can continue Mometasone cream twice daily up to 5 days a week as needed for itching.  Start [facility] anti-itch cream BID. Amitriptyline: 5%, Gabapentin: 10%, Lidocaine: 5%, Vehicle: Cream   Continue Singulair 10 mg at bedtime and Allegra 180 mg once daily.  Recommend gentle skin care.  Consider Dupixent or Adbry in the future if not improving with antihistamines and topical treatment.   Long term medication management.  Patient is using long term (months to years) prescription medication  to control their dermatologic condition.  These medications require periodic monitoring to evaluate for efficacy and side effects and may require periodic laboratory monitoring.  Laborious communication with interpreter and patient's.  Return for Atopic Dermatitis Follow Up in 3-6 months.  I, Lawson Radar, CMA, am acting as scribe for Armida Sans, MD.   Documentation: I have reviewed the above documentation for accuracy and completeness, and I agree with the above.  Armida Sans, MD

## 2023-02-12 NOTE — Patient Instructions (Addendum)
Can continue Mometasone cream twice daily up to 5 days a week as needed for itching.  Continue Singulair 10 mg at bedtime and Allegra 180 mg once daily.   Start [facility] anti-itch cream BID. Amitriptyline: 5%, Gabapentin: 10%, Lidocaine: 5%, Vehicle: Cream. Apply twice daily to affected areas on face and scalp.   Instructions for [facility] Medications  One or more of your medications was sent to the [facility] mail order compounding pharmacy. You will receive an email from them and can purchase the medicine through that link. It will then be mailed to your home at the address you confirmed. If for any reason you do not receive an email from them, please check your spam folder. If you still do not find the email, please let us know. [facility] phone number is [phone].    Due to recent changes in healthcare laws, you may see results of your pathology and/or laboratory studies on MyChart before the doctors have had a chance to review them. We understand that in some cases there may be results that are confusing or concerning to you. Please understand that not all results are received at the same time and often the doctors may need to interpret multiple results in order to provide you with the best plan of care or course of treatment. Therefore, we ask that you please give [place] 2 business days to thoroughly review all your results before contacting the office for clarification. Should we see a critical lab result, you will be contacted sooner.   If You Need Anything After Your Visit  If you have any questions or concerns for your doctor, please call our main line at [phone] and press option 4 to reach your doctor's medical assistant. If no one answers, please leave a voicemail as directed and we will return your call as soon as possible. Messages left after 4 pm will be answered the following business day.   You may also send [place] a message via MyChart. We typically  respond to MyChart messages within 1-2 business days.  For prescription refills, please ask your pharmacy to contact our office. Our fax number is [phone].  If you have an urgent issue when the clinic is closed that cannot wait until the next business day, you can page your doctor at the number below.    Please note that while we do our best to be available for urgent issues outside of office hours, we are not available 24/7.   If you have an urgent issue and are unable to reach [place], you may choose to seek medical care at your doctor's office, retail clinic, urgent care center, or emergency room.  If you have a medical emergency, please immediately call 911 or go to the emergency department.  Pager Numbers  - Dr. Gwen Pounds: [phone]  - Dr. Roseanne Reno: [phone]  - Dr. Katrinka Blazing: [phone]   In the event of inclement weather, please call our main line at [phone] for an update on the status of any delays or closures.  Dermatology Medication Tips: Please keep the boxes that topical medications come in in order to help keep track of the instructions about where and how to use these. Pharmacies typically print the medication instructions only on the boxes and not directly on the medication tubes.   If your medication is too expensive, please contact our office at [phone] option 4 or send [place] a message through MyChart.   We are unable to tell what your co-pay for  medications will be in advance as this is different depending on your insurance coverage. However, we may be able to find a substitute medication at lower cost or fill out paperwork to get insurance to cover a needed medication.   If a prior authorization is required to get your medication covered by your insurance company, please allow [place] 1-2 business days to complete this process.  Drug prices often vary depending on where the prescription is filled and some pharmacies may offer cheaper prices.  The website  www.goodrx.com contains coupons for medications through different pharmacies. The prices here do not account for what the cost may be with help from insurance (it may be cheaper with your insurance), but the website can give you the price if you did not use any insurance.  - You can print the associated coupon and take it with your prescription to the pharmacy.  - You may also stop by our office during regular business hours and pick up a GoodRx coupon card.  - If you need your prescription sent electronically to a different pharmacy, notify our office through [facility] or by phone at [phone] option 4.     Si Usted Necesita Algo Despus de Su Visita  Tambin puede enviarnos un mensaje a travs de Clinical cytogeneticist. Por lo general respondemos a los mensajes de MyChart en el transcurso de 1 a 2 das hbiles.  Para renovar recetas, por favor pida a su farmacia que se ponga en contacto con nuestra oficina. Annie Sable de fax es [place] [phone].  Si tiene un asunto urgente cuando la clnica est cerrada y que no puede esperar hasta el siguiente da hbil, puede llamar/localizar a su doctor(a) al nmero que aparece a continuacin.   Por favor, tenga en cuenta que aunque hacemos todo lo posible para estar disponibles para asuntos urgentes fuera del horario de [place], no estamos disponibles las 24 horas del da, los 7 [address]  [address] de la [place].   Si tiene un problema urgente y no puede comunicarse con nosotros, puede optar por buscar atencin mdica  en el consultorio de su doctor(a), en una clnica privada, en un centro de atencin urgente o en una sala de emergencias.  Si tiene Engineer, drilling, por favor llame inmediatamente al 911 o vaya a la sala de emergencias.  Nmeros de bper  - Dr. Gwen Pounds: [phone]  - Dra. Roseanne Reno: [phone]  - Dr. Katrinka Blazing: [phone]   En caso de inclemencias del tiempo, por favor llame a Lacy Duverney principal al [phone] para una actualizacin  sobre el [place] de cualquier retraso o cierre.  Consejos para la medicacin en dermatologa: Por favor, guarde las cajas en las que vienen los medicamentos de uso tpico para ayudarle a seguir las instrucciones sobre dnde y cmo usarlos. Las farmacias generalmente imprimen las instrucciones del medicamento slo en las cajas y no directamente en los tubos del [place].   Si su medicamento es muy caro, por favor, pngase en contacto con Rolm [place]ala llamando al [phone] y presione la opcin 4 o envenos un mensaje a travs de Clinical cytogeneticist.   No podemos decirle cul ser su copago por los medicamentos por adelantado ya que esto es diferente dependiendo de la cobertura de su seguro. Sin embargo, es posible que podamos encontrar un medicamento sustituto a Audiological scientist un formulario para que el seguro cubra el medicamento que se considera necesario.   Si se requiere una autorizacin previa para que su compaa de seguros [place] su medicamento, por favor  permtanos de 1 a 2 das hbiles para completar [address]  Los precios de los medicamentos varan con frecuencia dependiendo del Environmental consultant de dnde se surte la receta y alguna farmacias pueden ofrecer precios ms baratos.  El sitio web www.goodrx.com tiene cupones para medicamentos de Health and safety inspector. Los precios aqu no tienen en cuenta lo que podra costar con la ayuda del seguro (puede ser ms barato con su seguro), pero el sitio web puede darle el precio si no utiliz Tourist information centre manager.  - Puede imprimir el cupn correspondiente y llevarlo con su receta a la farmacia.  - Tambin puede pasar por nuestra oficina durante el horario de atencin regular y Education officer, museum una tarjeta de cupones de GoodRx.  - Si necesita que su receta se enve electrnicamente a una farmacia diferente, informe a nuestra oficina a travs de MyChart de [place] o por telfono llamando al [phone] y presione la opcin 4.

## 2023-02-13 ENCOUNTER — Ambulatory Visit: Payer: Medicare Other

## 2023-02-21 ENCOUNTER — Ambulatory Visit (INDEPENDENT_AMBULATORY_CARE_PROVIDER_SITE_OTHER): Payer: Medicare Other

## 2023-02-21 VITALS — BP 110/68 | Ht 65.0 in | Wt 155.3 lb

## 2023-02-21 DIAGNOSIS — Z0000 Encounter for general adult medical examination without abnormal findings: Secondary | ICD-10-CM | POA: Diagnosis not present

## 2023-02-21 NOTE — Progress Notes (Signed)
Subjective:   David Skinner is a 72 y.o. male who presents for Medicare Annual/Subsequent preventive examination.  Visit Complete: In person  Patient Medicare AWV questionnaire was completed by the patient on (not done); I have confirmed that all information answered by patient is correct and no changes since this date.  Review of Systems    Cardiac Risk Factors include: advanced age (>60men, >57 women);diabetes mellitus;dyslipidemia;male gender;hypertension;sedentary lifestyle    Objective:    Today's Vitals   02/21/23 1302  BP: 110/68  Weight: 155 lb 4.8 oz (70.4 kg)  Height: 5\' 5"  (1.651 m)  PainSc: 7    Body mass index is 25.84 kg/m.     02/21/2023    1:22 PM 11/30/2022   10:02 AM 08/28/2022    4:08 PM 06/19/2021    8:55 AM 01/16/2021    8:08 AM 04/10/2017    9:52 AM 03/25/2017    7:26 AM  Advanced Directives  Does Patient Have a Medical Advance Directive? No No No No No No No  Would patient like information on creating a medical advance directive?  No - Patient declined No - Patient declined Yes (MAU/Ambulatory/Procedural Areas - Information given) No - Patient declined  Yes (MAU/Ambulatory/Procedural Areas - Information given)    Current Medications (verified) Outpatient Encounter Medications as of 02/21/2023  Medication Sig   ASPIRIN 81 PO Take 1 tablet by mouth daily.   atorvastatin (LIPITOR) 80 MG tablet Take 1 tablet (80 mg total) by mouth daily.   donepezil (ARICEPT) 10 MG tablet Take 10 mg by mouth at bedtime.   DULoxetine (CYMBALTA) 60 MG capsule TAKE 1 CAPSULE BY MOUTH EVERY DAY   Ipratropium-Albuterol (COMBIVENT RESPIMAT) 20-100 MCG/ACT AERS respimat Inhale 1 puff into the lungs every 6 (six) hours.   levETIRAcetam (KEPPRA) 250 MG tablet Take 250 mg by mouth 1 day or 1 dose.   losartan (COZAAR) 25 MG tablet Take 1 tablet (25 mg total) by mouth daily.   memantine (NAMENDA) 5 MG tablet Take 5 mg by mouth 2 (two) times daily.   montelukast (SINGULAIR) 10 MG  tablet Take 10 mg by mouth at bedtime.   traZODone (DESYREL) 100 MG tablet TAKE 1 TABLET BY MOUTH EVERYDAY AT BEDTIME   Vibegron (GEMTESA) 75 MG TABS Take 1 tablet (75 mg total) by mouth daily.   alfuzosin (UROXATRAL) 10 MG 24 hr tablet Take 10 mg by mouth daily with breakfast. (Patient not taking: Reported on 02/21/2023)   isosorbide mononitrate (IMDUR) 30 MG 24 hr tablet Take 30 mg by mouth daily.   pantoprazole (PROTONIX) 40 MG tablet Take 1 tablet (40 mg total) by mouth daily.   No facility-administered encounter medications on file as of 02/21/2023.    Allergies (verified) Ibuprofen, Levofloxacin, Lisinopril, Metoprolol, Nsaids, Tolmetin, Tramadol hcl, and Tylenol [acetaminophen]   History: Past Medical History:  Diagnosis Date   Allergy    Anxiety    Atopy    Cataract of right eye     [facility]   Chronic constipation    Elevated LFTs    Fatty liver    GERD (gastroesophageal reflux disease)    Hyperlipidemia    Hypertension    Hypoglycemia    Insomnia    Memory change    Overweight    Paresthesia of hand    Seizure (HCC)    12/12, 09/27/14, 02/28/16   Tenosynovitis of finger    Tinea cruris    Vitamin D deficiency    Past Surgical History:  Procedure Laterality Date   COLONOSCOPY WITH PROPOFOL N/A 03/25/2017   Procedure: COLONOSCOPY WITH PROPOFOL;  Surgeon: Toney Reil, MD;  Location: [facility];  Service: Endoscopy;  Laterality: N/A;   COLONOSCOPY WITH PROPOFOL N/A 01/16/2021   Procedure: COLONOSCOPY WITH PROPOFOL;  Surgeon: Wyline Mood, MD;  Location: [facility] END[facility];  Service: Gastroenterology;  Laterality: N/A;  USE VIETNAMESE VIDEO INTERPRETER   ESOPHAGOGASTRODUODENOSCOPY N/A 03/25/2017   Procedure: ESOPHAGOGASTRODUODENOSCOPY (EGD);  Surgeon: Toney Reil, MD;  Location: [facility];  Service: Endoscopy;  Laterality: N/A;  Diabetic - oral meds   LASIK Bilateral    Family History  Problem Relation Age of Onset   Diabetes Mother     Heart disease Mother    Social History   Socioeconomic History   Marital status: Married    Spouse name: Champ Mungo   Number of children: 3   Years of education: Not on file   Highest education level: 12th grade  Occupational History   Occupation: Retired   Tobacco Use   Smoking status: Former    Current packs/day: 0.00    Average packs/day: 1 pack/day for 15.4 years (15.4 ttl pk-yrs)    Types: Cigarettes    Start date: 06/17/1984    Quit date: 11/28/1999    Years since quitting: 23.2   Smokeless tobacco: Never  Vaping Use   Vaping status: Never Used  Substance and Sexual Activity   Alcohol use: Not Currently    Alcohol/week: 10.0 standard drinks of alcohol    Types: 5 Cans of beer, 5 Shots of liquor per week   Drug use: No   Sexual activity: Yes    Partners: Female  Other Topics Concern   Not on file  Social History Narrative   Originally from [place]    Social Determinants of Health   Financial Resource Strain: Low Risk  (02/21/2023)   Overall Financial Resource Strain (CARDIA)    Difficulty of Paying Living Expenses: Not hard at all  Food Insecurity: No Food Insecurity (02/21/2023)   Hunger Vital Sign    Worried About Running Out of Food in the Last Year: Never true    Ran Out of Food in the Last Year: Never true  Transportation Needs: No Transportation Needs (02/21/2023)   PRAPARE - Administrator, Civil Service (Medical): No    Lack of Transportation (Non-Medical): No  Physical Activity: Inactive (02/21/2023)   Exercise Vital Sign    Days of Exercise per Week: 0 days    Minutes of Exercise per Session: 0 min  Stress: No Stress Concern Present (06/19/2021)   [employer] of Occupational Health - Occupational Stress Questionnaire    Feeling of Stress : Not at all  Social Connections: Moderately Integrated (02/21/2023)   Social Connection and Isolation Panel [NHANES]    Frequency of Communication with Friends and Family: More than three times a week     Frequency of Social Gatherings with Friends and Family: More than three times a week    Attends Religious Services: 1 to 4 times per year    Active Member of [employer] or Organizations: No    Attends Engineer, structural: Never    Marital Status: Married    Tobacco Counseling Counseling given: Not Answered   Clinical Intake:  Pre-visit preparation completed: Yes  Pain : 0-10 Pain Score: 7  Pain Type: Chronic pain Pain Location: Generalized Pain Descriptors / Indicators: Aching Pain Onset: More than a month ago Pain Frequency: Constant  Pain Relieving Factors: Tylenol  Pain Relieving Factors: Tylenol  BMI - recorded: 25.84 Nutritional Status: BMI 25 -29 Overweight Nutritional Risks: None Diabetes: Yes CBG done?: No Did pt. bring in CBG monitor from home?: No  How often do you need to have someone help you when you read instructions, pamphlets, or other written materials from your doctor or pharmacy?: 1 - Never  Interpreter Needed?: No  Comments: lives with wife and children Information entered by :: B.Shavonta Gossen,LPN   Activities of Daily Living    02/21/2023    1:26 PM 12/12/2022   10:02 AM  In your present state of health, do you have any difficulty performing the following activities:  Hearing? 0 0  Vision? 0 0  Difficulty concentrating or making decisions? 1 0  Comment forgetful   Walking or climbing stairs? 0 0  Dressing or bathing? 0 0  Doing errands, shopping? 0 0  Preparing Food and eating ? N   Using the Toilet? N   In the past six months, have you accidently leaked urine? N   Do you have problems with loss of bowel control? N   Managing your Medications? N   Managing your Finances? N   Housekeeping or managing your Housekeeping? N     Patient Care Team: Alba Cory, MD as PCP - General (Family Medicine) Lonell Face, MD as Consulting Physician (Neurology) Riki Altes, MD (Urology) Debbe Odea, MD as Consulting Physician  (Cardiology) Wyline Mood, MD as Consulting Physician (Gastroenterology) Gaspar Cola, [facility] (Inactive) (Pharmacist)  Indicate any recent Medical Services you may have received from other than [facility] providers in the past year (date may be approximate).     Assessment:   This is a routine wellness examination for David Skinner.  Hearing/Vision screen Hearing Screening - Comments:: Adequate hearing Vision Screening - Comments:: Adequate vision after laser surgery   Goals Addressed             This Visit's Progress    DIET - INCREASE WATER INTAKE   Not on track    Recommend drinking 6-8 glasses of water per day      Increase physical activity   Not on track    Recommend increasing physical activity to at least 3 days per week     Track and Manage My Blood Pressure-Hypertension   Not on track    Timeframe:  Long-Range Goal Priority:  High Start Date: 08/16/2021                            Expected End Date: 08/17/2022                      Follow Up within 90 days   - check blood pressure weekly    Why is this important?   You won't feel high blood pressure, but it can still hurt your blood vessels.  High blood pressure can cause heart or kidney problems. It can also cause a stroke.  Making lifestyle changes like losing a little weight or eating less salt will help.  Checking your blood pressure at home and at different times of the day can help to control blood pressure.  If the doctor prescribes medicine remember to take it the way the doctor ordered.  Call the office if you cannot afford the medicine or if there are questions about it.     Notes:  Depression Screen    02/21/2023    1:19 PM 12/12/2022   10:02 AM 11/06/2022   10:08 AM 08/07/2022   11:06 AM 03/11/2022    2:51 PM 12/05/2021    8:47 AM 06/19/2021    8:50 AM  PHQ 2/9 Scores  PHQ - 2 Score 0 0 0 0 0 0 0  PHQ- 9 Score  8 0 0 0 4 2    Fall Risk    02/21/2023    1:08 PM 12/12/2022   10:02 AM 11/06/2022   10:05  AM 08/07/2022   11:06 AM 03/11/2022    2:51 PM  Fall Risk   Falls in the past year? 1 0 0 0 0  Number falls in past yr: 0 0   0  Injury with Fall? 1 0   0  Risk for fall due to : No Fall Risks No Fall Risks No Fall Risks No Fall Risks   Follow up Education provided;Falls prevention discussed Falls prevention discussed Falls prevention discussed Falls prevention discussed     MEDICARE RISK AT HOME: Medicare Risk at Home Any stairs in or around the home?: Yes If so, are there any without handrails?: Yes Home free of loose throw rugs in walkways, pet beds, electrical cords, etc?: Yes Adequate lighting in your home to reduce risk of falls?: Yes Life alert?: No Use of a cane, walker or w/c?: No Grab bars in the bathroom?: Yes Shower chair or bench in shower?: Yes Elevated toilet seat or a handicapped toilet?: No  TIMED UP AND GO:  Was the test performed?  Yes  Length of time to ambulate 10 feet: 8 sec Gait steady and fast without use of assistive device    Cognitive Function:        02/21/2023    1:27 PM 05/18/2020   12:01 PM  6CIT Screen  What Year? 0 points 0 points  What month? 0 points 0 points  What time? 0 points 0 points  Count back from 20 0 points 0 points  Months in reverse -- 0 points  Repeat phrase 8 points 6 points  Total Score  6 points    Immunizations Immunization History  Administered Date(s) Administered   Fluad Quad(high Dose 65+) 02/09/2019, 03/10/2020, 06/19/2021, 03/11/2022   Hepatitis A 02/25/2013, 11/12/2013   Hepatitis A, Adult 03/24/2017   Hepatitis B 02/25/2013, 11/12/2013   Hepatitis B, ADULT 11/28/2014, 03/24/2017   Influenza, High Dose Seasonal PF 04/08/2018   Influenza,inj,Quad PF,6+ Mos 03/08/2015, 05/28/2016, 04/10/2017   Influenza-Unspecified 02/25/2013, 03/01/2014   Moderna Sars-Covid-2 Vaccination 07/30/2019, 08/27/2019, 04/19/2020, 01/03/2021   Pneumococcal Conjugate-13 04/08/2018   Pneumococcal Polysaccharide-23 03/21/2015,  09/11/2020   Tdap 12/02/2012   Zoster, Live 03/08/2015    TDAP status: Up to date  Flu Vaccine status: Up to date  Pneumococcal vaccine status: Up to date  Covid-19 vaccine status: Completed vaccines  Qualifies for Shingles Vaccine? Yes   Zostavax completed No   Shingrix Completed?: No.    Education has been provided regarding the importance of this vaccine. Patient has been advised to call insurance company to determine out of pocket expense if they have not yet received this vaccine. Advised may also receive vaccine at local pharmacy or Health Dept. Verbalized acceptance and understanding.  Screening Tests Health Maintenance  Topic Date Due   Zoster Vaccines- Shingrix (1 of 2) 03/23/1972   DTaP/Tdap/Td (2 - Td or Tdap) 12/03/2022   INFLUENZA VACCINE  01/16/2023   COVID-19 Vaccine (5 - 2023-24  season) 02/16/2023   Diabetic kidney evaluation - Urine ACR  03/13/2023   FOOT EXAM  03/12/2023   HEMOGLOBIN A1C  06/13/2023   OPHTHALMOLOGY EXAM  07/31/2023   Diabetic kidney evaluation - eGFR measurement  11/30/2023   Medicare Annual Wellness (AWV)  02/21/2024   Colonoscopy  01/17/2028   Pneumonia Vaccine 34+ Years old  Completed   Hepatitis C Screening  Completed   HPV VACCINES  Aged Out    Health Maintenance  Health Maintenance Due  Topic Date Due   Zoster Vaccines- Shingrix (1 of 2) 03/23/1972   DTaP/Tdap/Td (2 - Td or Tdap) 12/03/2022   INFLUENZA VACCINE  01/16/2023   COVID-19 Vaccine (5 - 2023-24 season) 02/16/2023   Diabetic kidney evaluation - Urine ACR  03/13/2023    Colorectal cancer screening: Type of screening: Colonoscopy. Completed yes. Repeat every 5-10 years  Lung Cancer Screening: (Low Dose CT Chest recommended if Age 83-80 years, 20 pack-year currently smoking OR have quit w/in 15years.) does not qualify.   Lung Cancer Screening Referral: no  Additional Screening:  Hepatitis C Screening: does not qualify; Completed yes  Vision Screening: Recommended  annual ophthalmology exams for early detection of glaucoma and other disorders of the eye. Is the patient up to date with their annual eye exam?  No  Who is the provider or what is the name of the office in which the patient attends annual eye exams? Does not know If pt is not established with a provider, would they like to be referred to a provider to establish care? No .   Dental Screening: Recommended annual dental exams for proper oral hygiene  Diabetic Foot Exam: Diabetic Foot Exam: Overdue, Pt has been advised about the importance in completing this exam. Pt is scheduled for diabetic foot exam on in November 2024 with PCP visit.  Community Resource Referral / Chronic Care Management: CRR required this visit?  No   CCM required this visit?  No    Plan:     I have personally reviewed and noted the following in the patient's chart:   Medical and social history Use of alcohol, tobacco or illicit drugs  Current medications and supplements including opioid prescriptions. Patient is not currently taking opioid prescriptions. Functional ability and status Nutritional status Physical activity Advanced directives List of other physicians Hospitalizations, surgeries, and ER visits in previous 12 months Vitals Screenings to include cognitive, depression, and falls Referrals and appointments  In addition, I have reviewed and discussed with patient certain preventive protocols, quality metrics, and best practice recommendations. A written personalized care plan for preventive services as well as general preventive health recommendations were provided to patient.    Sue Lush, LPN   12/16/5364   After Visit Summary: (MyChart) Due to this being a telephonic visit, the after visit summary with patients personalized plan was offered to patient via MyChart PRINTED AND GIVEN TO PT  Nurse Notes: The patient states he is doing well and has no concerns or questions at this time. He does  inquire about a Vitamin D prescription. Pt has PCP appt in November and desires Vitamin D level taken. He has no other concerns or questions at this time.

## 2023-02-21 NOTE — Patient Instructions (Signed)
David Skinner , Thank you for taking time to come for your Medicare Wellness Visit. I appreciate your ongoing commitment to your health goals. Please review the following plan we discussed and let me know if I can assist you in the future.   Referrals/Orders/Follow-Ups/Clinician Recommendations: none  This is a list of the screening recommended for you and due dates:  Health Maintenance  Topic Date Due   Zoster (Shingles) Vaccine (1 of 2) 03/23/1972   DTaP/Tdap/Td vaccine (2 - Td or Tdap) 12/03/2022   Flu Shot  01/16/2023   COVID-19 Vaccine (5 - 2023-24 season) 02/16/2023   Yearly kidney health urinalysis for diabetes  03/13/2023   Complete foot exam   03/12/2023   Hemoglobin A1C  06/13/2023   Eye exam for diabetics  07/31/2023   Yearly kidney function blood test for diabetes  11/30/2023   Medicare Annual Wellness Visit  02/21/2024   Colon Cancer Screening  01/17/2028   Pneumonia Vaccine  Completed   Hepatitis C Screening  Completed   HPV Vaccine  Aged Out    Advanced directives: (Declined) Advance directive discussed with you today. Even though you declined this today, please call our office should you change your mind, and we can give you the proper paperwork for you to fill out.  Next Medicare Annual Wellness Visit scheduled for next year: Yes 02/27/2024 @ 1PM in person

## 2023-03-02 ENCOUNTER — Other Ambulatory Visit: Payer: Self-pay | Admitting: Family Medicine

## 2023-03-02 DIAGNOSIS — E785 Hyperlipidemia, unspecified: Secondary | ICD-10-CM

## 2023-03-03 MED ORDER — ATORVASTATIN CALCIUM 80 MG PO TABS
80.0000 mg | ORAL_TABLET | Freq: Every day | ORAL | 0 refills | Status: DC
Start: 2023-03-03 — End: 2023-04-21

## 2023-03-16 ENCOUNTER — Encounter: Payer: Self-pay | Admitting: Family Medicine

## 2023-04-01 ENCOUNTER — Encounter: Payer: Self-pay | Admitting: Urology

## 2023-04-04 ENCOUNTER — Ambulatory Visit (INDEPENDENT_AMBULATORY_CARE_PROVIDER_SITE_OTHER): Payer: Medicare Other | Admitting: Physician Assistant

## 2023-04-04 ENCOUNTER — Encounter: Payer: Self-pay | Admitting: Physician Assistant

## 2023-04-04 VITALS — BP 144/79 | HR 91 | Ht 65.0 in | Wt 151.0 lb

## 2023-04-04 DIAGNOSIS — N138 Other obstructive and reflux uropathy: Secondary | ICD-10-CM

## 2023-04-04 DIAGNOSIS — N401 Enlarged prostate with lower urinary tract symptoms: Secondary | ICD-10-CM | POA: Diagnosis not present

## 2023-04-04 DIAGNOSIS — R3915 Urgency of urination: Secondary | ICD-10-CM

## 2023-04-04 MED ORDER — GEMTESA 75 MG PO TABS
75.0000 mg | ORAL_TABLET | Freq: Every day | ORAL | 11 refills | Status: DC
Start: 2023-04-04 — End: 2023-09-02

## 2023-04-04 MED ORDER — ALFUZOSIN HCL ER 10 MG PO TB24: 10.0000 mg | ORAL_TABLET | Freq: Every day | ORAL | 11 refills | Status: DC

## 2023-04-04 NOTE — Progress Notes (Signed)
04/04/2023 1:49 PM   David Skinner 09/29/1950 [id number]  CC: Chief Complaint  Patient presents with   Medication Management   HPI: David Skinner is a 72 y.o. male with PMH BPH with LUTS previously on alfuzosin, urinary urgency on Gemtesa, and ED on tadalafil who presents today for follow-up.  Visit today facilitated by in person [place] language interpreter.  Today he reports he ran out of both medications.  When he takes both of them, he is pleased with his urinary symptoms and feels that things are well-managed.  He had previously stopped alfuzosin, but wishes to resume it.  PMH: Past Medical History:  Diagnosis Date   Allergy    Anxiety    Atopy    Cataract of right eye    [facility]   Chronic constipation    Elevated LFTs    Fatty liver    GERD (gastroesophageal reflux disease)    Hyperlipidemia    Hypertension    Hypoglycemia    Insomnia    Memory change    Overweight    Paresthesia of hand    Seizure (HCC)    12/12, 09/27/14, 02/28/16   Tenosynovitis of finger    Tinea cruris    Vitamin D deficiency     Surgical History: Past Surgical History:  Procedure Laterality Date   COLONOSCOPY WITH PROPOFOL N/A 03/25/2017   Procedure: COLONOSCOPY WITH PROPOFOL;  Surgeon: Toney Reil, MD;  Location: [facility];  Service: Endoscopy;  Laterality: N/A;   COLONOSCOPY WITH PROPOFOL N/A 01/16/2021   Procedure: COLONOSCOPY WITH PROPOFOL;  Surgeon: Wyline Mood, MD;  Location: [facility];  Service: Gastroenterology;  Laterality: N/A;  USE VIET[place]ESE VIDEO INTERPRETER   ESOPHAGOGASTRODUODENOSCOPY N/A 03/25/2017   Procedure: ESOPHAGOGASTRODUODENOSCOPY (EGD);  Surgeon: Toney Reil, MD;  Location[facility];  Service: Endoscopy;  Laterality: N/A;  Diabetic - oral meds   LASIK Bilateral     Home Medications:  Allergies as of 04/04/2023       Reactions   Ibuprofen Itching   Levofloxacin    Lisinopril Cough   Metoprolol     Nsaids Itching   Tolmetin Itching   Tramadol Hcl Itching   Tylenol [acetaminophen] Itching        Medication List        Accurate as of April 04, 2023  1:49 PM. If you have any questions, ask your nurse or doctor.          alfuzosin 10 MG 24 hr tablet Commonly known as: UROXATRAL Take 1 tablet (10 mg total) by mouth daily with breakfast.   ASPIRIN 81 PO Take 1 tablet by mouth daily.   atorvastatin 80 MG tablet Commonly known as: LIPITOR Take 1 tablet (80 mg total) by mouth daily.   Combivent Respimat 20-100 MCG/ACT Aers respimat Generic drug: Ipratropium-Albuterol Inhale 1 puff into the lungs every 6 (six) hours.   donepezil 10 MG tablet Commonly known as: ARICEPT Take 10 mg by mouth at bedtime.   DULoxetine 60 MG capsule Commonly known as: CYMBALTA TAKE 1 CAPSULE BY MOUTH EVERY DAY   Gemtesa 75 MG Tabs Generic drug: Vibegron Take 1 tablet (75 mg total) by mouth daily.   isosorbide mononitrate 30 MG 24 hr tablet Commonly known as: IMDUR Take 30 mg by mouth daily.   levETIRAcetam 250 MG tablet Commonly known as: KEPPRA Take 250 mg by mouth 1 day or 1 dose.   losartan 25 MG tablet Commonly known as: COZAAR Take  1 tablet (25 mg total) by mouth daily.   memantine 5 MG tablet Commonly known as: NAMENDA Take 5 mg by mouth 2 (two) times daily.   montelukast 10 MG tablet Commonly known as: SINGULAIR Take 10 mg by mouth at bedtime.   pantoprazole 40 MG tablet Commonly known as: Protonix Take 1 tablet (40 mg total) by mouth daily.   traZODone 100 MG tablet Commonly known as: DESYREL TAKE 1 TABLET BY MOUTH EVERYDAY AT BEDTIME        Allergies:  Allergies  Allergen Reactions   Ibuprofen Itching   Levofloxacin    Lisinopril Cough   Metoprolol    Nsaids Itching   Tolmetin Itching   Tramadol Hcl Itching   Tylenol [Acetaminophen] Itching    Family History: Family History  Problem Relation Age of Onset   Diabetes Mother    Heart disease  Mother     Social History:   reports that he quit smoking about 23 years ago. His smoking use included cigarettes. He started smoking about 38 years ago. He has a 15.4 pack-year smoking history. He has never used smokeless tobacco. He reports that he does not currently use alcohol after a past usage of about 10.0 standard drinks of alcohol per week. He reports that he does not use drugs.  Physical Exam: BP (!) 144/79 (BP Location: Left Arm, Patient Position: Sitting, Cuff Size: Normal)   Pulse 91   Ht 5\' 5"  (1.651 m)   Wt 151 lb (68.5 kg)   BMI 25.13 kg/m   Constitutional:  Alert and oriented, no acute distress, nontoxic appearing HEENT: [place], AT Cardiovascular: No clubbing, cyanosis, or edema Respiratory: Normal respiratory effort, no increased work of breathing Skin: No rashes, bruises or suspicious lesions Neurologic: Grossly intact, no focal deficits, moving all 4 extremities Psychiatric: Normal mood and affect  Assessment & Plan:   1. Benign prostatic hyperplasia with urinary obstruction Well-managed on alfuzosin, he wishes to resume this.  Will send in a 1 year prescription. - alfuzosin (UROXATRAL) 10 MG 24 hr tablet; Take 1 tablet (10 mg total) by mouth daily with breakfast.  Dispense: 30 tablet; Refill: 11  2. Urinary urgency Well-managed on Gemtesa.  Will refill for another year. - Vibegron (GEMTESA) 75 MG TABS; Take 1 tablet (75 mg total) by mouth daily.  Dispense: 30 tablet; Refill: 11  Return in about 1 year (around 04/03/2024) for Annual IPSS/PVR with Dr. Lonna Cobb.  Carman Ching, PA-C  [facility] [place] [address] [place], [place] [address] [phone]

## 2023-04-11 ENCOUNTER — Encounter: Payer: Self-pay | Admitting: *Deleted

## 2023-04-14 ENCOUNTER — Ambulatory Visit: Payer: Medicare Other | Admitting: Family Medicine

## 2023-04-18 ENCOUNTER — Other Ambulatory Visit: Payer: Self-pay | Admitting: Dermatology

## 2023-04-18 DIAGNOSIS — L2081 Atopic neurodermatitis: Secondary | ICD-10-CM

## 2023-04-18 NOTE — Progress Notes (Unsigned)
Name: David Skinner   MRN: [id number]    DOB: 1950/11/29   Date:04/21/2023       Progress Note  Subjective  Chief Complaint  Follow Up He came in with his wife and interpreter : Tresa Endo with [facility]  HPI  Perennial allergic rhinitis : taking montelukast, over the past week he noticed increase in cough and rhinorrhea, but taking otc medication and symptoms are improved. He has mild fatigue, no fever or chills.   Major depression  in remission : going on for years but  worse in  2018, he was on Cymbalta since April 2018, he is taking Duloxetine , he has dementia . He has been taking medications   Hyperlipidemia :he is taking Atorvastatin  80 mg daily No myalgia or chest pain. Last LDL went up from 59 to 80 , he states compliant with medication but his memory is poor,  his step son is going to start dispensing his medication, and we will recheck next visit    OSA/minimal/RLS/Memory loss :he had a  sleep study back in 2018 it showed minimal sleep apnea and RLS - he is up to date with appointments with Dr. Sherryll Burger. He does not wear CPAP   HTN: BP is at goal today,  he takes losartan 25 mg daily . He denies recent chest pain or palpitation   Alcoholism  : he used to drink heavy while in [place] used to drink liquor at least 3 times a week and used to get drunk, when he moved to [place] ( 1979) . He continues to drink alcohol - 6-8 beers on plus 3-4 oz on weekends    Urinary frequency: under the care of Urologist,  taking medication and doing better    Partial Seizure: wife thinks last episode was around 2019 he has been compliant with medication for a while. 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  He  denies polyphagia or polydipsia, has urinary frequency due to LUTS  Continues statin therapy. A1C has been at goal with life style modification only , today A1C is 6.3 %     Insomnia: he sleeps well with Trazodone, no side effects . 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) 03/09/2015   Chronic constipation 11/28/2014   Insomnia 11/28/2014   Dyslipidemia 11/28/2014   Fatty infiltration of liver 11/28/2014   Essential (primary) hypertension 11/28/2014   Gastric reflux 11/28/2014   Memory loss or impairment 11/28/2014   Perennial allergic rhinitis with seasonal variation 11/28/2014   Vitamin D deficiency 11/28/2014   Tenosynovitis of thumb 11/28/2014   Partial epilepsy with impairment of consciousness (HCC) 11/16/2014    Past Surgical History:  Procedure Laterality Date   COLONOSCOPY WITH PROPOFOL N/A 03/25/2017   Procedure: COLONOSCOPY WITH PROPOFOL;  Surgeon: Toney Reil, MD;  Location: [facility];  Service: Endoscopy;  Laterality: N/A;   COLONOSCOPY WITH PROPOFOL N/A 01/16/2021   Procedure: COLONOSCOPY WITH PROPOFOL;  Surgeon: Wyline Mood, MD;  Location: [facility] END[facility]COPY;  Service: Gastroenterology;  Laterality: N/A;  USE [place] VIDEO INTERPRETER   ESOPHAGOGASTRODUODENOSCOPY N/A 03/25/2017   Procedure: ESOPHAGOGASTRODUODENOSCOPY (EGD);  Surgeon: Toney Reil, MD;  Location: [facility];  Service: Endoscopy;  Laterality: N/A;  Diabetic - oral meds   LASIK Bilateral     Family History  Problem Relation Age of Onset   Diabetes Mother    Heart disease Mother     Social  History   Tobacco Use   Smoking status: Former    Current packs/day: 0.00    Average packs/day: 1 pack/day for 15.4 years (15.4 ttl pk-yrs)    Types: Cigarettes    Start date: 06/17/1984    Quit date: 11/28/1999    Years since quitting: 23.4   Smokeless tobacco: Never  Substance Use Topics   Alcohol use: Not Currently    Alcohol/week: 10.0 standard drinks of alcohol    Types: 5 Cans of beer, 5 Shots of liquor per week     Current Outpatient Medications:    alfuzosin (UROXATRAL) 10 MG 24 hr tablet, Take 1 tablet (10 mg total) by mouth daily with breakfast., Disp: 30 tablet, Rfl: 11   ASPIRIN 81 PO, Take 1 tablet by mouth daily., Disp: , Rfl:    atorvastatin (LIPITOR) 80 MG tablet, Take 1 tablet (80 mg total) by mouth daily., Disp: 90 tablet, Rfl: 0   donepezil (ARICEPT) 10 MG tablet, Take 10 mg by mouth at bedtime., Disp: , Rfl:    DULoxetine (CYMBALTA) 60 MG capsule, TAKE 1 CAPSULE BY MOUTH EVERY DAY, Disp: 90 capsule, Rfl: 1   Ipratropium-Albuterol (COMBIVENT RESPIMAT) 20-100 MCG/ACT AERS respimat, Inhale 1 puff into the lungs every 6 (six) hours., Disp: , Rfl:    levETIRAcetam (KEPPRA) 250 MG tablet, Take 250 mg by mouth 1 day or 1 dose., Disp: , Rfl:    losartan (COZAAR) 25 MG tablet, Take 1 tablet (25 mg total) by mouth daily., Disp: 90 tablet, Rfl: 1   memantine (NAMENDA) 5 MG tablet, Take 5 mg by mouth 2 (two) times daily., Disp: , Rfl:    montelukast (SINGULAIR) 10 MG tablet, Take 10 mg by mouth at bedtime., Disp: , Rfl:    traZODone (DESYREL) 100 MG tablet, TAKE 1 TABLET BY MOUTH EVERYDAY AT BEDTIME, Disp: 90 tablet, Rfl: 0   Vibegron (GEMTESA) 75 MG TABS, Take 1 tablet (75 mg total) by mouth daily., Disp: 30 tablet, Rfl: 11   mometasone (ELOCON) 0.1 % cream, APPLY TWICE DAILY FOR 2 WEEKS THEN 5 DAYS PER WEEK UNTIL RETURN TO OFFICE. (Patient not taking: Reported on 04/21/2023), Disp: 45 g, Rfl: 1  Allergies  Allergen Reactions   Ibuprofen Itching   Levofloxacin    Lisinopril  Cough   Metoprolol    Nsaids Itching   Tolmetin Itching   Tramadol Hcl Itching   Tylenol [Acetaminophen] Itching    I personally reviewed active problem list, medication list, allergies, family history, social history, health maintenance with the patient/caregiver today.   ROS  Constitutional: Negative for fever or weight change.  Respiratory: positive  for cough and intermittent shortness of breath.   Cardiovascular: Negative for chest pain or palpitations.  Gastrointestinal: Negative for abdominal pain, no bowel changes.  Musculoskeletal: Negative for gait problem or joint swelling.  Skin: positive for rash.  Neurological: Negative for dizziness or headache.  No other specific complaints in a complete review of systems (except as listed in HPI above).   Objective  Vitals:   04/21/23 1000  BP: 118/72  Pulse: 91  Resp: 16  SpO2: 99%  Weight: 156 lb (70.8 kg)  Height: 5\' 5"  (1.651 m)    Body mass index is 25.96 kg/m.  Physical Exam  Constitutional: Patient appears well-developed and well-nourished.  No distress.  HEENT: head atraumatic, normocephalic, pupils equal and reactive to light, neck supple Cardiovascular: Normal rate, regular rhythm and normal heart sounds.  No murmur heard. No BLE edema. Pulmonary/Chest: Effort normal and breath sounds normal. No respiratory distress. Skin: under the care of dermatologist , he picks on his skin Abdominal: Soft.  There is no tenderness. Psychiatric: Patient has a normal mood and affect. behavior is normal. Judgment and thought content normal.   Recent Results (from the past 2160 hour(s))  POCT HgB A1C     Status: Abnormal   Collection Time: 04/21/23 10:01 AM  Result Value Ref Range   Hemoglobin A1C 6.3 (A) 4.0 - 5.6 %   HbA1c POC (<> result, manual entry)     HbA1c, POC (prediabetic range)     HbA1c, POC (controlled diabetic range)       PHQ2/9:    04/21/2023   10:01 AM 02/21/2023    1:19 PM 12/12/2022   10:02 AM  11/06/2022   10:08 AM 08/07/2022   11:06 AM  Depression screen PHQ 2/9  Decreased Interest 0 0 0 0 0  Down, Depressed, Hopeless 0 0 0 0 0  PHQ - 2 Score 0 0 0 0 0  Altered sleeping 1  3 0 0  Tired, decreased energy 3  3 0 0  Change in appetite 0  1 0 0  Feeling bad or failure about yourself  0  0 0 0  Trouble concentrating 0  1 0 0  Moving slowly or fidgety/restless 0  0 0 0  Suicidal thoughts 0  0 0 0  PHQ-9 Score 4  8 0 0    phq 9 is negative   Fall Risk:    04/21/2023   10:01 AM 02/21/2023    1:08 PM 12/12/2022   10:02 AM 11/06/2022   10:05 AM 08/07/2022   11:06 AM  Fall Risk   Falls in the past year? 0 1 0 0 0  Number falls in past yr: 0 0 0    Injury with Fall? 0 1 0    Risk for fall due to : No Fall Risks No Fall Risks No Fall Risks No Fall Risks No Fall Risks  Follow up Falls prevention discussed Education provided;Falls prevention discussed Falls prevention discussed Falls prevention discussed Falls prevention discussed      Functional Status Survey: Is the patient deaf or have difficulty hearing?: No Does the patient have difficulty seeing, even when wearing glasses/contacts?: No Does the patient have difficulty concentrating, remembering, or making decisions?: No Does the patient have difficulty walking or climbing stairs?: No Does the patient have difficulty dressing or bathing?: No Does the patient have difficulty doing errands alone such as visiting a doctor's office or shopping?: No    Assessment & Plan  1. Dyslipidemia associated with type 2 diabetes mellitus (HCC)  - POCT HgB A1C - Urine Microalbumin w/creat. ratio - [facility] Diabetes Foot Exam  2. Mild recurrent major depression (HCC)  Stable on medication  3. Alcoholism (HCC)  Still drinking 6-8 beers Saturday and Sunday  4. 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 Major depression in remission (HCC)  - DULoxetine (CYMBALTA)  60 MG capsule; TAKE 1 CAPSULE BY MOUTH EVERY DAY  Dispense: 90 capsule; Refill: 1  6. Partial epilepsy with impairment of consciousness (HCC)  Under the care of Dr. Sherryll Burger   7. Dyslipidemia  - atorvastatin (LIPITOR) 80 MG tablet; Take 1 tablet (80 mg total) by mouth daily.  Dispense: 90 tablet; Refill: 0  8. Essential (primary) hypertension  - losartan (COZAAR) 25 MG tablet; Take 1 tablet (25 mg total) by mouth daily.  Dispense: 90 tablet; Refill: 1  9. Primary insomnia  - traZODone (DESYREL) 100 MG tablet; Take 1 tablet (100 mg total) by mouth at bedtime.  Dispense: 90 tablet; Refill: 1  10. Needs flu shot  - Flu Vaccine Trivalent High Dose (Fluad)  11. Perennial allergic rhinitis with seasonal variation  - montelukast (SINGULAIR) 10 MG tablet; Take 1 tablet (10 mg total) by mouth at bedtime.  Dispense: 90 tablet; Refill: 1

## 2023-04-21 ENCOUNTER — Encounter: Payer: Self-pay | Admitting: Family Medicine

## 2023-04-21 ENCOUNTER — Ambulatory Visit (INDEPENDENT_AMBULATORY_CARE_PROVIDER_SITE_OTHER): Payer: Medicare Other | Admitting: Family Medicine

## 2023-04-21 VITALS — BP 118/72 | HR 91 | Resp 16 | Ht 65.0 in | Wt 156.0 lb

## 2023-04-21 DIAGNOSIS — G40209 Localization-related (focal) (partial) symptomatic epilepsy and epileptic syndromes with complex partial seizures, not intractable, without status epilepticus: Secondary | ICD-10-CM

## 2023-04-21 DIAGNOSIS — I209 Angina pectoris, unspecified: Secondary | ICD-10-CM

## 2023-04-21 DIAGNOSIS — E1159 Type 2 diabetes mellitus with other circulatory complications: Secondary | ICD-10-CM | POA: Diagnosis not present

## 2023-04-21 DIAGNOSIS — F5101 Primary insomnia: Secondary | ICD-10-CM

## 2023-04-21 DIAGNOSIS — E1169 Type 2 diabetes mellitus with other specified complication: Secondary | ICD-10-CM

## 2023-04-21 DIAGNOSIS — E785 Hyperlipidemia, unspecified: Secondary | ICD-10-CM | POA: Diagnosis not present

## 2023-04-21 DIAGNOSIS — F1020 Alcohol dependence, uncomplicated: Secondary | ICD-10-CM

## 2023-04-21 DIAGNOSIS — E1140 Type 2 diabetes mellitus with diabetic neuropathy, unspecified: Secondary | ICD-10-CM

## 2023-04-21 DIAGNOSIS — J3089 Other allergic rhinitis: Secondary | ICD-10-CM

## 2023-04-21 DIAGNOSIS — Z23 Encounter for immunization: Secondary | ICD-10-CM | POA: Diagnosis not present

## 2023-04-21 DIAGNOSIS — F330 Major depressive disorder, recurrent, mild: Secondary | ICD-10-CM | POA: Diagnosis not present

## 2023-04-21 DIAGNOSIS — I10 Essential (primary) hypertension: Secondary | ICD-10-CM

## 2023-04-21 DIAGNOSIS — J302 Other seasonal allergic rhinitis: Secondary | ICD-10-CM

## 2023-04-21 DIAGNOSIS — F325 Major depressive disorder, single episode, in full remission: Secondary | ICD-10-CM

## 2023-04-21 LAB — POCT GLYCOSYLATED HEMOGLOBIN (HGB A1C): Hemoglobin A1C: 6.3 % — AB (ref 4.0–5.6)

## 2023-04-21 MED ORDER — TRAZODONE HCL 100 MG PO TABS: 100.0000 mg | ORAL_TABLET | Freq: Every day | ORAL | 1 refills | Status: DC

## 2023-04-21 MED ORDER — LOSARTAN POTASSIUM 25 MG PO TABS
25.0000 mg | ORAL_TABLET | Freq: Every day | ORAL | 1 refills | Status: DC
Start: 2023-04-21 — End: 2024-05-10

## 2023-04-21 MED ORDER — DULOXETINE HCL 60 MG PO CPEP
ORAL_CAPSULE | ORAL | 1 refills | Status: DC
Start: 2023-04-21 — End: 2024-02-02

## 2023-04-21 MED ORDER — MONTELUKAST SODIUM 10 MG PO TABS
10.0000 mg | ORAL_TABLET | Freq: Every day | ORAL | 1 refills | Status: DC
Start: 2023-04-21 — End: 2024-05-10

## 2023-04-21 MED ORDER — ATORVASTATIN CALCIUM 80 MG PO TABS
80.0000 mg | ORAL_TABLET | Freq: Every day | ORAL | 0 refills | Status: DC
Start: 2023-04-21 — End: 2023-09-01

## 2023-04-21 NOTE — Patient Instructions (Addendum)
B12 sublingual 1000 mcg once a week B1 once a week Vitamin D 2000 units daily   Please get Tdap and shingrix at local pharmacy

## 2023-04-22 LAB — MICROALBUMIN / CREATININE URINE RATIO
Creatinine, Urine: 214 mg/dL (ref 20–320)
Microalb Creat Ratio: 7 mg/g{creat} (ref <30)
Microalb, Ur: 1.5 mg/dL

## 2023-05-01 ENCOUNTER — Telehealth: Payer: Self-pay | Admitting: Physician Assistant

## 2023-05-01 NOTE — Telephone Encounter (Signed)
Patient's wife called and stated that patient has tried to pick up prescription for alfuzosin from [facility] on [address] and they do not have prescription. There is a previous note regarding this issue on 04/11/23, and they still don't have his prescription. Please advise patient.

## 2023-05-01 NOTE — Telephone Encounter (Signed)
Yes please

## 2023-05-02 NOTE — Telephone Encounter (Signed)
Called pharmacy to confirm medication was sent in. Pharmacy states pt picked up 3 month supply of afluzosin on 04/08/2023.   Tried to call pt to confirm he picked medication up. LVM for pt to return call

## 2023-05-23 DIAGNOSIS — Z23 Encounter for immunization: Secondary | ICD-10-CM | POA: Diagnosis not present

## 2023-05-29 ENCOUNTER — Ambulatory Visit: Payer: Medicare Other | Admitting: Dermatology

## 2023-07-18 ENCOUNTER — Other Ambulatory Visit: Payer: Self-pay | Admitting: Family Medicine

## 2023-07-18 DIAGNOSIS — F5101 Primary insomnia: Secondary | ICD-10-CM

## 2023-09-01 ENCOUNTER — Encounter: Payer: Self-pay | Admitting: Family Medicine

## 2023-09-01 ENCOUNTER — Ambulatory Visit: Payer: Medicare Other | Admitting: Family Medicine

## 2023-09-01 VITALS — BP 124/72 | HR 80 | Resp 16 | Ht 65.0 in | Wt 153.1 lb

## 2023-09-01 DIAGNOSIS — I10 Essential (primary) hypertension: Secondary | ICD-10-CM

## 2023-09-01 DIAGNOSIS — E559 Vitamin D deficiency, unspecified: Secondary | ICD-10-CM

## 2023-09-01 DIAGNOSIS — F1020 Alcohol dependence, uncomplicated: Secondary | ICD-10-CM

## 2023-09-01 DIAGNOSIS — E785 Hyperlipidemia, unspecified: Secondary | ICD-10-CM

## 2023-09-01 DIAGNOSIS — E1159 Type 2 diabetes mellitus with other circulatory complications: Secondary | ICD-10-CM | POA: Diagnosis not present

## 2023-09-01 DIAGNOSIS — G40209 Localization-related (focal) (partial) symptomatic epilepsy and epileptic syndromes with complex partial seizures, not intractable, without status epilepticus: Secondary | ICD-10-CM | POA: Diagnosis not present

## 2023-09-01 DIAGNOSIS — I209 Angina pectoris, unspecified: Secondary | ICD-10-CM

## 2023-09-01 DIAGNOSIS — G4733 Obstructive sleep apnea (adult) (pediatric): Secondary | ICD-10-CM

## 2023-09-01 DIAGNOSIS — J3089 Other allergic rhinitis: Secondary | ICD-10-CM

## 2023-09-01 DIAGNOSIS — F5101 Primary insomnia: Secondary | ICD-10-CM

## 2023-09-01 DIAGNOSIS — E1169 Type 2 diabetes mellitus with other specified complication: Secondary | ICD-10-CM | POA: Diagnosis not present

## 2023-09-01 DIAGNOSIS — J302 Other seasonal allergic rhinitis: Secondary | ICD-10-CM

## 2023-09-01 LAB — POCT GLYCOSYLATED HEMOGLOBIN (HGB A1C): Hemoglobin A1C: 6.1 % — AB (ref 4.0–5.6)

## 2023-09-01 MED ORDER — TRAZODONE HCL 100 MG PO TABS: 100.0000 mg | ORAL_TABLET | Freq: Every day | ORAL | 0 refills | Status: DC

## 2023-09-01 MED ORDER — AZELASTINE HCL 0.1 % NA SOLN: 2.0000 | Freq: Two times a day (BID) | NASAL | 1 refills | Status: DC

## 2023-09-01 MED ORDER — FLUTICASONE PROPIONATE 50 MCG/ACT NA SUSP: 2.0000 | Freq: Every day | NASAL | 1 refills | Status: DC

## 2023-09-01 MED ORDER — ATORVASTATIN CALCIUM 80 MG PO TABS
80.0000 mg | ORAL_TABLET | Freq: Every day | ORAL | 1 refills | Status: DC
Start: 2023-09-01 — End: 2024-05-10

## 2023-09-01 NOTE — Progress Notes (Signed)
 Name: David Skinner   MRN: [id number]    DOB: 09-12-1950   Date:09/01/2023       Progress Note  Subjective  Chief Complaint  Chief Complaint  Patient presents with   Medical Management of Chronic Issues   HPI   Interpreter : Y[place]Keo  Perennial allergic rhinitis :symptoms are worse this time of the year, he is having rhinorrhea, taking singulair and nasal spray. He has noticed some right ear fullness/feels under water   Major depression  in remission : going on for years but  worse in  2018, he was on Cymbalta since April 2018, he is taking Duloxetine and mood is good at home except for burst of anger - he also has dementia and takes medication for that    Hyperlipidemia :he is taking Atorvastatin  80 mg daily No myalgia or chest pain. Last LDL went up from 59 to 80 , he states compliant with medication but his memory is poor,  his step son is going to start dispensing his medication,. Last LDL was 105 , we will recheck next visit    OSA/minimal/RLS/Memory loss :he had a  sleep study back in 2018 it showed minimal sleep apnea and RLS - he is up to date with appointments with Dr. Sherryll Burger. He does not wear CPAP . Keep visit with neurologist    HTN: BP is at goal today,  he takes losartan 25 mg daily . He denies palpitation. He seems compliant with medications   Alcoholism  : he used to drink heavy while in [place] used to drink liquor at least 3 times a week and used to get drunk, when he moved to [place] ( 1979) . ,He spent a day in [place] and was working all day , since he has been back he drinks a lot of the weekend from 11 am to 5 pm. Beer/wine and licquor. 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  He  denies polyphagia or polydipsia, has urinary frequency due to LUTS  Continues statin therapy. A1C has been at goal with life style modification only , today A1C is 6.1 % .    Insomnia: he sleeps well with Trazodone, no side effects . Sending refill to pharmacy today   History of Fall: he states occasionally falls, he states last fall happened yesterday , he went to car wash and tripped on right leg and fell on left knee, no swelling or pain today . Patient denies drinking yesterday. Discussed fall prevention, wear close toe shoes, pick up his feet     Patient Active Problem List   Diagnosis Date Noted   Hyperlipidemia, mixed 07/02/2021   Coronary artery disease involving native coronary artery of native heart 07/02/2021   Alcoholism (HCC) 03/10/2020   OSA (obstructive sleep apnea) 12/09/2016   RLS (restless legs syndrome) 12/09/2016   Major depression in remission (HCC) 12/09/2016   Cervical radiculitis 01/09/2016   Lumbosacral radiculitis 01/09/2016   Osteoarthritis of carpometacarpal (CMC) joint of thumb 01/09/2016   Tinea cruris 03/21/2015   Type 2 diabetes mellitus with  neuropathy causing erectile dysfunction (HCC) 03/09/2015   Chronic constipation 11/28/2014   Insomnia 11/28/2014   Dyslipidemia 11/28/2014   Fatty infiltration of liver 11/28/2014   Essential (primary) hypertension 11/28/2014   Gastric reflux 11/28/2014   Memory loss or impairment 11/28/2014   Perennial allergic rhinitis with seasonal variation 11/28/2014   Vitamin D deficiency 11/28/2014   Tenosynovitis of thumb 11/28/2014   Partial epilepsy with impairment of consciousness (HCC) 11/16/2014    Past Surgical History:  Procedure Laterality Date   COLONOSCOPY WITH PROPOFOL N/A 03/25/2017   Procedure: COLONOSCOPY WITH PROPOFOL;  Surgeon: Toney Reil, MD;  Location: [facility];  Service: Endoscopy;   Laterality: N/A;   COLONOSCOPY WITH PROPOFOL N/A 01/16/2021   Procedure: COLONOSCOPY WITH PROPOFOL;  Surgeon: Wyline Mood, MD;  Location: [facility];  Service: Gastroenterology;  Laterality: N/A;  USE VIET[place]ESE VIDEO INTERPRETER   ESOPHAGOGASTRODUODENOSCOPY N/A 03/25/2017   Procedure: ESOPHAGOGASTRODUODENOSCOPY (EGD);  Surgeon: Toney Reil, MD;  Location: [facility];  Service: Endoscopy;  Laterality: N/A;  Diabetic - oral meds   LASIK Bilateral     Family History  Problem Relation Age of Onset   Diabetes Mother    Heart disease Mother     Social History   Tobacco Use   Smoking status: Former    Current packs/day: 0.00    Average packs/day: 1 pack/day for 15.4 years (15.4 ttl pk-yrs)    Types: Cigarettes    Start date: 06/17/1984    Quit date: 11/28/1999    Years since quitting: 23.7   Smokeless tobacco: Never  Substance Use Topics   Alcohol use: Not Currently    Alcohol/week: 10.0 standard drinks of alcohol    Types: 5 Cans of beer, 5 Shots of liquor per week     Current Outpatient Medications:    alfuzosin (UROXATRAL) 10 MG 24 hr tablet, Take 1 tablet (10 mg total) by mouth daily with breakfast., Disp: 30 tablet, Rfl: 11   ASPIRIN 81 PO, Take 1 tablet by mouth daily., Disp: , Rfl:    donepezil (ARICEPT) 10 MG tablet, Take 10 mg by mouth at bedtime., Disp: , Rfl:    DULoxetine (CYMBALTA) 60 MG capsule, TAKE 1 CAPSULE BY MOUTH EVERY DAY, Disp: 90 capsule, Rfl: 1   Ipratropium-Albuterol (COMBIVENT RESPIMAT) 20-100 MCG/ACT AERS respimat, Inhale 1 puff into the lungs every 6 (six) hours., Disp: , Rfl:    levETIRAcetam (KEPPRA) 250 MG tablet, Take 250 mg by mouth 1 day or 1 dose., Disp: , Rfl:    losartan (COZAAR) 25 MG tablet, Take 1 tablet (25 mg total) by mouth daily., Disp: 90 tablet, Rfl: 1   memantine (NAMENDA) 5 MG tablet, Take 5 mg by mouth 2 (two) times daily., Disp: , Rfl:    montelukast (SINGULAIR) 10 MG tablet, Take 1 tablet (10 mg total) by mouth at  bedtime., Disp: 90 tablet, Rfl: 1   Vibegron (GEMTESA) 75 MG TABS, Take 1 tablet (75 mg total) by mouth daily., Disp: 30 tablet, Rfl: 11   atorvastatin (LIPITOR) 80 MG tablet, Take 1 tablet (80 mg total) by mouth daily., Disp: 90 tablet, Rfl: 1   mometasone (ELOCON) 0.1 % cream, APPLY TWICE DAILY FOR 2 WEEKS THEN 5 DAYS PER WEEK UNTIL RETURN TO OFFICE. (Patient not taking: Reported on 09/01/2023), Disp: 45 g, Rfl: 1   traZODone (DESYREL) 100 MG tablet, Take 1 tablet (100 mg total) by mouth at bedtime., Disp: 90 tablet, Rfl: 0  Allergies  Allergen Reactions  Ibuprofen Itching   Levofloxacin    Lisinopril Cough   Metoprolol    Nsaids Itching   Tolmetin Itching   Tramadol Hcl Itching   Tylenol [Acetaminophen] Itching    I personally reviewed active problem list, medication list, allergies, family history with the patient/caregiver today.   ROS  Ten systems reviewed and is negative except as mentioned in HPI    Objective  Vitals:   09/01/23 1028  BP: 124/72  Pulse: 80  Resp: 16  SpO2: 98%  Weight: 153 lb 1.6 oz (69.4 kg)  Height: 5\' 5"  (1.651 m)    Body mass index is 25.48 kg/m.  Physical Exam  Constitutional: Patient appears well-developed and well-nourished. Obese  No distress.  HEENT: head atraumatic, normocephalic, pupils equal and reactive to light, neck supple Cardiovascular: Normal rate, regular rhythm and normal heart sounds.  No murmur heard. No BLE edema. Pulmonary/Chest: Effort normal and breath sounds normal. No respiratory distress. Abdominal: Soft.  There is no tenderness. Psychiatric: Patient has a normal mood and affect. behavior is normal. Judgment and thought content normal.   Recent Results (from the past 2160 hours)  POCT glycosylated hemoglobin (Hb A1C)     Status: Abnormal   Collection Time: 09/01/23 10:31 AM  Result Value Ref Range   Hemoglobin A1C 6.1 (A) 4.0 - 5.6 %   HbA1c POC (<> result, manual entry)     HbA1c, POC (prediabetic range)      HbA1c, POC (controlled diabetic range)      Diabetic Foot Exam:     PHQ2/9:    09/01/2023   10:27 AM 04/21/2023   10:01 AM 02/21/2023    1:19 PM 12/12/2022   10:02 AM 11/06/2022   10:08 AM  Depression screen PHQ 2/9  Decreased Interest 0 0 0 0 0  Down, Depressed, Hopeless 0 0 0 0 0  PHQ - 2 Score 0 0 0 0 0  Altered sleeping 0 1  3 0  Tired, decreased energy 0 3  3 0  Change in appetite 0 0  1 0  Feeling bad or failure about yourself  0 0  0 0  Trouble concentrating 0 0  1 0  Moving slowly or fidgety/restless 0 0  0 0  Suicidal thoughts 0 0  0 0  PHQ-9 Score 0 4  8 0  Difficult doing work/chores Not difficult at all        phq 9 is negative  Fall Risk:    09/01/2023   10:28 AM 04/21/2023   10:01 AM 02/21/2023    1:08 PM 12/12/2022   10:02 AM 11/06/2022   10:05 AM  Fall Risk   Falls in the past year? 1 0 1 0 0  Number falls in past yr: 0 0 0 0   Injury with Fall? 0 0 1 0   Risk for fall due to : Impaired balance/gait No Fall Risks No Fall Risks No Fall Risks No Fall Risks  Follow up Education provided;Falls evaluation completed;Falls prevention discussed Falls prevention discussed Education provided;Falls prevention discussed Falls prevention discussed Falls prevention discussed     Assessment & Plan  1. Dyslipidemia associated with type 2 diabetes mellitus (HCC) (Primary)  - POCT glycosylated hemoglobin (Hb A1C)  2. Partial epilepsy with impairment of consciousness (HCC)  Taking medications, see Dr. Sherryll Burger  3. 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 OSA (obstructive sleep apnea)  Not compliant with CPAP  5.  Alcoholism (HCC)  Still drinking heavily but not daily  6. Essential (primary) hypertension  BP is at goal   7. Vitamin D deficiency  Continue supplementation   8. Primary insomnia  - traZODone (DESYREL) 100 MG tablet; Take 1 tablet (100 mg total) by mouth at bedtime.  Dispense: 90 tablet;  Refill: 0  9. Dyslipidemia  - atorvastatin (LIPITOR) 80 MG tablet; Take 1 tablet (80 mg total) by mouth daily.  Dispense: 90 tablet; Refill: 1  10. Perennial allergic rhinitis with seasonal variation  - fluticasone (FLONASE) 50 MCG/ACT nasal spray; Place 2 sprays into both nostrils daily.  Dispense: 48 g; Refill: 1 - azelastine (ASTELIN) 0.1 % nasal spray; Place 2 sprays into both nostrils 2 (two) times daily. Use in each nostril as directed  Dispense: 60 mL; Refill: 1

## 2023-09-01 NOTE — Addendum Note (Signed)
 Addended by: Alba Cory F on: 09/01/2023 12:44 PM   Modules accepted: Level of Service

## 2023-09-02 ENCOUNTER — Ambulatory Visit (INDEPENDENT_AMBULATORY_CARE_PROVIDER_SITE_OTHER): Admitting: Physician Assistant

## 2023-09-02 VITALS — BP 114/79 | HR 105 | Ht 65.0 in | Wt 150.0 lb

## 2023-09-02 DIAGNOSIS — N401 Enlarged prostate with lower urinary tract symptoms: Secondary | ICD-10-CM

## 2023-09-02 DIAGNOSIS — N138 Other obstructive and reflux uropathy: Secondary | ICD-10-CM | POA: Diagnosis not present

## 2023-09-02 DIAGNOSIS — R3915 Urgency of urination: Secondary | ICD-10-CM | POA: Diagnosis not present

## 2023-09-02 DIAGNOSIS — N5201 Erectile dysfunction due to arterial insufficiency: Secondary | ICD-10-CM | POA: Diagnosis not present

## 2023-09-02 LAB — BLADDER SCAN AMB NON-IMAGING: Scan Result: 0

## 2023-09-02 MED ORDER — ALFUZOSIN HCL ER 10 MG PO TB24: 10.0000 mg | ORAL_TABLET | Freq: Every day | ORAL | 11 refills | Status: AC

## 2023-09-02 MED ORDER — TADALAFIL 5 MG PO TABS: 5.0000 mg | ORAL_TABLET | Freq: Every day | ORAL | 11 refills | Status: DC

## 2023-09-02 MED ORDER — GEMTESA 75 MG PO TABS
75.0000 mg | ORAL_TABLET | Freq: Every day | ORAL | 11 refills | Status: DC
Start: 2023-09-02 — End: 2023-09-05

## 2023-09-02 NOTE — Progress Notes (Signed)
 09/02/2023 2:50 PM   David Skinner [birth year] [id number]  CC: Chief Complaint  Patient presents with   Benign Prostatic Hypertrophy   HPI: David Skinner is a 73 y.o. male with PMH BPH with LUTS on alfuzosin, urinary urgency on Gemtesa, and ED previously on tadalafil who presents today for follow-up of bothersome urinary symptoms.  Visit today facilitated by in person [place] language interpreter.   Today he reports stable voiding symptoms.  He is primarily bothered by urgency, frequency, and nocturia x 4-5.  He thinks he is still on alfuzosin, but is unsure.  He does not think he is on [place] and he is fairly sure he ran out of tadalafil.  IPSS 28/terrible as below.  PVR 0 mL.  In-office UA today positive for trace protein; urine microscopy pan negative.   IPSS     Row Name 09/02/23 1400         International Prostate Symptom Score   How often have you had the sensation of not emptying your bladder? More than half the time     How often have you had to urinate less than every two hours? Less than 1 in 5 times     How often have you found you stopped and started again several times when you urinated? More than half the time     How often have you found it difficult to postpone urination? Almost always     How often have you had a weak urinary stream? Almost always     How often have you had to strain to start urination? Almost always     How many times did you typically get up at night to urinate? 4 Times     Total IPSS Score 28       Quality of Life due to urinary symptoms   If you were to spend the rest of your life with your urinary condition just the way it is now how would you feel about that? Terrible               PMH: Past Medical History:  Diagnosis Date   Allergy    Anxiety    Atopy    Cataract of right eye    [facility]   Chronic constipation    Elevated LFTs    Fatty liver    GERD (gastroesophageal reflux disease)     Hyperlipidemia    Hypertension    Hypoglycemia    Insomnia    Memory change    Overweight    Paresthesia of hand    Seizure (HCC)    12/12, 09/27/14, 02/28/16   Tenosynovitis of finger    Tinea cruris    Vitamin D deficiency     Surgical History: Past Surgical History:  Procedure Laterality Date   COLONOSCOPY WITH PROPOFOL N/A 03/25/2017   Procedure: COLONOSCOPY WITH PROPOFOL;  Surgeon: Toney Reil, MD;  Location: [facility];  Service: Endoscopy;  Laterality: N/A;   COLONOSCOPY WITH PROPOFOL N/A 01/16/2021   Procedure: COLONOSCOPY WITH PROPOFOL;  Surgeon: Wyline Mood, MD;  Location: [facility] END[facility];  Service: Gastroenterology;  Laterality: N/A;  USE [place]ESE VIDEO INTERPRETER   ESOPHAGOGASTRODUODENOSCOPY N/A 03/25/2017   Procedure: ESOPHAGOGASTRODUODENOSCOPY (EGD);  Surgeon: Toney Reil, MD;  Location: [facility];  Service: Endoscopy;  Laterality: N/A;  Diabetic - oral meds   LASIK Bilateral     Home Medications:  Allergies as of 09/02/2023       Reactions  Ibuprofen Itching   Levofloxacin    Lisinopril Cough   Metoprolol    Nsaids Itching   Tolmetin Itching   Tramadol Hcl Itching   Tylenol [acetaminophen] Itching        Medication List        Accurate as of September 02, 2023  2:50 PM. If you have any questions, ask your nurse or doctor.          STOP taking these medications    mometasone 0.1 % cream Commonly known as: ELOCON       TAKE these medications    alfuzosin 10 MG 24 hr tablet Commonly known as: UROXATRAL Take 1 tablet (10 mg total) by mouth daily with breakfast.   ASPIRIN 81 PO Take 1 tablet by mouth daily.   atorvastatin 80 MG tablet Commonly known as: LIPITOR Take 1 tablet (80 mg total) by mouth daily.   azelastine 0.1 % nasal spray Commonly known as: ASTELIN Place 2 sprays into both nostrils 2 (two) times daily. Use in each nostril as directed   Combivent Respimat 20-100 MCG/ACT Aers respimat Generic  drug: Ipratropium-Albuterol Inhale 1 puff into the lungs every 6 (six) hours.   donepezil 10 MG tablet Commonly known as: ARICEPT Take 10 mg by mouth at bedtime.   DULoxetine 60 MG capsule Commonly known as: CYMBALTA TAKE 1 CAPSULE BY MOUTH EVERY DAY   fluticasone 50 MCG/ACT nasal spray Commonly known as: FLONASE Place 2 sprays into both nostrils daily.   Gemtesa 75 MG Tabs Generic drug: Vibegron Take 1 tablet (75 mg total) by mouth daily.   levETIRAcetam 250 MG tablet Commonly known as: KEPPRA Take 250 mg by mouth 1 day or 1 dose.   losartan 25 MG tablet Commonly known as: COZAAR Take 1 tablet (25 mg total) by mouth daily.   memantine 5 MG tablet Commonly known as: NAMENDA Take 5 mg by mouth 2 (two) times daily.   montelukast 10 MG tablet Commonly known as: SINGULAIR Take 1 tablet (10 mg total) by mouth at bedtime.   traZODone 100 MG tablet Commonly known as: DESYREL Take 1 tablet (100 mg total) by mouth at bedtime.        Allergies:  Allergies  Allergen Reactions   Ibuprofen Itching   Levofloxacin    Lisinopril Cough   Metoprolol    Nsaids Itching   Tolmetin Itching   Tramadol Hcl Itching   Tylenol [Acetaminophen] Itching    Family History: Family History  Problem Relation Age of Onset   Diabetes Mother    Heart disease Mother     Social History:   reports that he quit smoking about 23 years ago. His smoking use included cigarettes. He started smoking about 39 years ago. He has a 15.4 pack-year smoking history. He has never used smokeless tobacco. He reports that he does not currently use alcohol after a past usage of about 10.0 standard drinks of alcohol per week. He reports that he does not use drugs.  Physical Exam: BP 114/79   Pulse (!) 105   Ht 5\' 5"  (1.651 m)   Wt 150 lb (68 kg)   BMI 24.96 kg/m   Constitutional:  Alert and oriented, no acute distress, nontoxic appearing HEENT: [place], AT Cardiovascular: No clubbing, cyanosis, or  edema Respiratory: Normal respiratory effort, no increased work of breathing Skin: No rashes, bruises or suspicious lesions Neurologic: Grossly intact, no focal deficits, moving all 4 extremities Psychiatric: Normal mood and affect  Laboratory Data: Results for  orders placed or performed in visit on 09/02/23  Bladder Scan (Post Void Residual) in office   Collection Time: 09/02/23  2:46 PM  Result Value Ref Range   Scan Result 0    Assessment & Plan:   1. Benign prostatic hyperplasia with urinary obstruction (Primary) IPSS with severe symptoms, though he is emptying appropriately.  His most bothersome symptoms appear to be storage related.  Unclear which medications he is currently taking.  We discussed resuming triple therapy with alfuzosin, Gemtesa, and tadalafil.  If he is no better with this, he can come back and see me sooner than planned.  Otherwise, we will keep follow-up with Dr. Lonna Cobb later this year.  Will also recheck PSA today since his last value was 2 years ago.  Will contact him with results. - Urinalysis, Complete - Bladder Scan (Post Void Residual) in office - alfuzosin (UROXATRAL) 10 MG 24 hr tablet; Take 1 tablet (10 mg total) by mouth daily with breakfast.  Dispense: 30 tablet; Refill: 11 - tadalafil (CIALIS) 5 MG tablet; Take 1 tablet (5 mg total) by mouth daily.  Dispense: 30 tablet; Refill: 11 - PSA Total (Reflex To Free)  2. Urinary urgency Doing Gemtesa as above. - Vibegron (GEMTESA) 75 MG TABS; Take 1 tablet (75 mg total) by mouth daily.  Dispense: 30 tablet; Refill: 11  3. Erectile dysfunction due to arterial insufficiency Resuming tadalafil as above - tadalafil (CIALIS) 5 MG tablet; Take 1 tablet (5 mg total) by mouth daily.  Dispense: 30 tablet; Refill: 11   Return for Keep follow-up as scheduled.  Carman Ching, PA-C  [facility] [place] [address], [address] [place], [place] [address] [phone]

## 2023-09-02 NOTE — Patient Instructions (Addendum)
 Uroxatral (alfuzosin) Gemtesa (vibegron) Cialis (tadalafil)

## 2023-09-03 LAB — MICROSCOPIC EXAMINATION: Bacteria, UA: NONE SEEN

## 2023-09-03 LAB — PSA TOTAL (REFLEX TO FREE): Prostate Specific Ag, Serum: 1.2 ng/mL (ref 0.0–4.0)

## 2023-09-03 LAB — URINALYSIS, COMPLETE
Bilirubin, UA: NEGATIVE
Glucose, UA: NEGATIVE
Ketones, UA: NEGATIVE
Leukocytes,UA: NEGATIVE
Nitrite, UA: NEGATIVE
RBC, UA: NEGATIVE
Specific Gravity, UA: >1.03 — ABNORMAL HIGH (ref 1.005–1.030)
Urobilinogen, Ur: 0.2 mg/dL (ref 0.2–1.0)
pH, UA: 5 (ref 5.0–7.5)

## 2023-09-05 ENCOUNTER — Telehealth: Payer: Self-pay

## 2023-09-05 MED ORDER — MIRABEGRON ER 25 MG PO TB24: 25.0000 mg | ORAL_TABLET | Freq: Every day | ORAL | 3 refills | Status: DC

## 2023-09-05 NOTE — Telephone Encounter (Signed)
 Sent patient mychart message in regards to Dr [employer] message. Myrbetriq sent in.   Riki Altes, MD  Levada Schilling, [place] hours ago (5:26 PM)    He took [employer] and 2019/2020.  It apparently worked initially then he started having bedwetting which he attributed to the medication.  Okay to try again.  Would start the at 25 mg dose     Qualls, Joyce Copa, CMA  Stoioff, [place] C, MD21 hours ago (10:56 AM)   Can he take myrbetriq??     Harlow Ohms A routed conversation to Levada Schilling, [place] hours ago (9:02 AM)   David Skinner  P Jennette Kettle Admin (supporting Riki Altes, MD)2 days ago    Can he switch to [employer] it's cheaper. If the doctor wants gemtesa specifically can submit prior authorization to Medicare insurance to explain why he needs it and get them to pay for it     David Skinner  P Jennette Kettle Admin (supporting Riki Altes, MD)2 days ago    I went to pharmacy to pick up my RX but it says that the insurance won't cover because it's too expensive. Is there somewhere else I can get it filled for less? What's the medication that was prescribed to me? Thank you!

## 2023-09-22 DIAGNOSIS — J45909 Unspecified asthma, uncomplicated: Secondary | ICD-10-CM | POA: Insufficient documentation

## 2023-10-24 ENCOUNTER — Other Ambulatory Visit: Payer: Self-pay | Admitting: Dermatology

## 2023-10-24 DIAGNOSIS — L2081 Atopic neurodermatitis: Secondary | ICD-10-CM

## 2023-12-15 ENCOUNTER — Ambulatory Visit: Admitting: Dermatology

## 2023-12-16 ENCOUNTER — Encounter: Payer: Self-pay | Admitting: Dermatology

## 2023-12-16 ENCOUNTER — Ambulatory Visit (INDEPENDENT_AMBULATORY_CARE_PROVIDER_SITE_OTHER): Admitting: Dermatology

## 2023-12-16 DIAGNOSIS — Z7189 Other specified counseling: Secondary | ICD-10-CM

## 2023-12-16 DIAGNOSIS — L281 Prurigo nodularis: Secondary | ICD-10-CM

## 2023-12-16 DIAGNOSIS — L299 Pruritus, unspecified: Secondary | ICD-10-CM

## 2023-12-16 DIAGNOSIS — L2081 Atopic neurodermatitis: Secondary | ICD-10-CM | POA: Diagnosis not present

## 2023-12-16 DIAGNOSIS — Z79899 Other long term (current) drug therapy: Secondary | ICD-10-CM

## 2023-12-16 MED ORDER — MOMETASONE FUROATE 0.1 % EX CREA: TOPICAL_CREAM | CUTANEOUS | 3 refills | Status: AC

## 2023-12-16 MED ORDER — DUPILUMAB 300 MG/2ML ~~LOC~~ SOSY: 300.0000 mg | PREFILLED_SYRINGE | SUBCUTANEOUS | Status: AC

## 2023-12-16 MED ORDER — DUPILUMAB 300 MG/2ML ~~LOC~~ SOSY
600.0000 mg | PREFILLED_SYRINGE | Freq: Once | SUBCUTANEOUS | Status: AC
  Administered 2023-12-16: 600 mg via SUBCUTANEOUS

## 2023-12-16 NOTE — Progress Notes (Signed)
   Follow-Up Visit   Subjective  David Skinner is a 73 y.o. 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 Pt states condition is very itchy and painful, and no previously prescribed medications have helped. Currently he is using no medications.   [employer] used today. PI#[id number]  The following portions of the chart were reviewed this encounter and updated as appropriate: medications, allergies, medical history  Review of Systems:  No other skin or systemic complaints except as noted in HPI or Assessment and Plan.  Objective  Well appearing patient in no apparent distress; mood and affect are within normal limits.  Areas Examined: The face and scalp  Relevant physical exam findings are noted in the Assessment and Plan.    Assessment & Plan   ATOPIC NEURODERMATITIS   Related Medications mometasone  (ELOCON ) 0.1 % cream Apply to aa's face and scalp BID PRN up to 5 days per week. 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   Exam: Scaly pink papules coalescing to plaques 6% BSA but severe on the face  Chronic and persistent condition with duration or expected duration over one year. Condition is bothersome/symptomatic for patient. Currently flared.  Atopic dermatitis (eczema) is a chronic, relapsing, pruritic condition that can significantly affect quality of life. It is often associated with allergic rhinitis and/or asthma and can require treatment with topical medications, phototherapy, or in severe cases biologic injectable medication (Dupixent; Adbry) or Oral JAK inhibitors.  Treatment Plan: Discussed and recommend Dupixent  injections. Dupilumab (Dupixent) is a treatment given by injection for adults and children with moderate-to-severe atopic dermatitis. Goal is control of skin condition, not cure. It is given as 2 injections at the first dose followed by 1 injection ever 2 weeks thereafter.  Young children are dosed monthly.  Potential side effects include allergic reaction, herpes infections, injection site reactions and conjunctivitis (inflammation of the eyes).  The use of Dupixent requires long term medication management, including periodic office visits.  Patient agrees to starting Dupixent samples.   Dupixent 300mg /83mL injected SQ into the R upper arm post and Dupixent 300mg /33mL injected SQ into the L upper arm post. Patient tolerated injections well. AL, CMA  Recommend gentle skin care.  Patient would like refills of Mometasone  0.1% cream, apply to aa's BID PRN up to 5d/wk. Topical steroids (such as triamcinolone , fluocinolone, fluocinonide, mometasone , clobetasol , halobetasol, betamethasone, hydrocortisone) can cause thinning and lightening of the skin if they are used for too long in the same area. Your physician has selected the right strength medicine for your problem and area affected on the body. Please use your medication only as directed by your physician to prevent side effects.   Return in about 2 weeks (around 12/30/2023) for Dupixent injection and atopic neurodermatitis follow up with Dr. Hester.  LILLETTE Rosina Mayans, CMA, am acting as scribe for Alm Hester, MD .  Documentation: I have reviewed the above documentation for accuracy and completeness, and I agree with the above.  Alm Hester, MD

## 2023-12-16 NOTE — Patient Instructions (Addendum)
Dupilumab (Dupixent) is a treatment given by injection for adults and children with moderate-to-severe atopic dermatitis. Goal is control of skin condition, not cure. It is given as 2 injections at the first dose followed by 1 injection ever 2 weeks thereafter.  Young children are dosed monthly.  Potential side effects include allergic reaction, herpes infections, injection site reactions and conjunctivitis (inflammation of the eyes).  The use of Dupixent requires long term medication management, including periodic office visits.  Due to recent changes in healthcare laws, you may see results of your pathology and/or laboratory studies on MyChart before the doctors have had a chance to review them. We understand that in some cases there may be results that are confusing or concerning to you. Please understand that not all results are received at the same time and often the doctors may need to interpret multiple results in order to provide you with the best plan of care or course of treatment. Therefore, we ask that you please give [place] 2 business days to thoroughly review all your results before contacting the office for clarification. Should we see a critical lab result, you will be contacted sooner.   If You Need Anything After Your Visit  If you have any questions or concerns for your doctor, please call our main line at [phone] and press option 4 to reach your doctor's medical assistant. If no one answers, please leave a voicemail as directed and we will return your call as soon as possible. Messages left after 4 pm will be answered the following business day.   You may also send [place] a message via MyChart. We typically respond to MyChart messages within 1-2 business days.  For prescription refills, please ask your pharmacy to contact our office. Our fax number is [phone].  If you have an urgent issue when the clinic is closed that cannot wait until the next business day, you can page your  doctor at the number below.    Please note that while we do our best to be available for urgent issues outside of office hours, we are not available 24/7.   If you have an urgent issue and are unable to reach [place], you may choose to seek medical care at your doctor's office, retail clinic, urgent care center, or emergency room.  If you have a medical emergency, please immediately call 911 or go to the emergency department.  Pager Numbers  - Dr. Gwen Pounds: [phone]  - Dr. Roseanne Reno: [phone]  - Dr. Katrinka Blazing: [phone]   In the event of inclement weather, please call our main line at [phone] for an update on the status of any delays or closures.  Dermatology Medication Tips: Please keep the boxes that topical medications come in in order to help keep track of the instructions about where and how to use these. Pharmacies typically print the medication instructions only on the boxes and not directly on the medication tubes.   If your medication is too expensive, please contact our office at [phone] option 4 or send [place] a message through MyChart.   We are unable to tell what your co-pay for medications will be in advance as this is different depending on your insurance coverage. However, we may be able to find a substitute medication at lower cost or fill out paperwork to get insurance to cover a needed medication.   If a prior authorization is required to get your medication covered by your insurance company, please allow [place] 1-2 business days to complete  this process.  Drug prices often vary depending on where the prescription is filled and some pharmacies may offer cheaper prices.  The website www.goodrx.com contains coupons for medications through different pharmacies. The prices here do not account for what the cost may be with help from insurance (it may be cheaper with your insurance), but the website can give you the price if you did not use any insurance.  - You can print  the associated coupon and take it with your prescription to the pharmacy.  - You may also stop by our office during regular business hours and pick up a GoodRx coupon card.  - If you need your prescription sent electronically to a different pharmacy, notify our office through [facility] or by phone at [phone] option 4.     Si Usted Necesita Algo Despus de Su Visita  Tambin puede enviarnos un mensaje a travs de Clinical cytogeneticist. Por lo general respondemos a los mensajes de MyChart en el transcurso de 1 a 2 das hbiles.  Para renovar recetas, por favor pida a su farmacia que se ponga en contacto con nuestra oficina. Annie Sable de fax es [place] [phone].  Si tiene un asunto urgente cuando la clnica est cerrada y que no puede esperar hasta el siguiente da hbil, puede llamar/localizar a su doctor(a) al nmero que aparece a continuacin.   Por favor, tenga en cuenta que aunque hacemos todo lo posible para estar disponibles para asuntos urgentes fuera del horario de [place], no estamos disponibles las 24 horas del da, los 7 [address]  [address] de la [place].   Si tiene un problema urgente y no puede comunicarse con nosotros, puede optar por buscar atencin mdica  en el consultorio de su doctor(a), en una clnica privada, en un centro de atencin urgente o en una sala de emergencias.  Si tiene Engineer, drilling, por favor llame inmediatamente al 911 o vaya a la sala de emergencias.  Nmeros de bper  - Dr. Gwen Pounds: [phone]  - Dra. Roseanne Reno: [phone]  - Dr. Katrinka Blazing: [phone]   En caso de inclemencias del tiempo, por favor llame a Lacy Duverney principal al [phone] para una actualizacin sobre el [place] de cualquier retraso o cierre.  Consejos para la medicacin en dermatologa: Por favor, guarde las cajas en las que vienen los medicamentos de uso tpico para ayudarle a seguir las instrucciones sobre dnde y cmo usarlos. Las farmacias generalmente imprimen las  instrucciones del medicamento slo en las cajas y no directamente en los tubos del [place].   Si su medicamento es muy caro, por favor, pngase en contacto con Rolm Gala llamando al [phone] y presione la opcin 4 o envenos un mensaje a travs de Clinical cytogeneticist.   No podemos decirle cul ser su copago por los medicamentos por adelantado ya que esto es diferente dependiendo de la cobertura de su seguro. Sin embargo, es posible que podamos encontrar un medicamento sustituto a Audiological scientist un formulario para que el seguro cubra el medicamento que se considera necesario.   Si se requiere una autorizacin previa para que su compaa de seguros [place] su medicamento, por favor permtanos de 1 a 2 das hbiles para completar [address]  Los precios de los medicamentos varan con frecuencia dependiendo del Environmental consultant de dnde se surte la receta y alguna farmacias pueden ofrecer precios ms baratos.  El sitio web www.goodrx.com tiene cupones para medicamentos de Health and safety inspector. Los precios aqu no tienen en cuenta lo que podra costar con la ayuda del  seguro (puede ser ms barato con su seguro), pero el sitio web puede darle el precio si no Visual merchandiser.  - Puede imprimir el cupn correspondiente y llevarlo con su receta a la farmacia.  - Tambin puede pasar por nuestra oficina durante el horario de atencin regular y Education officer, museum una tarjeta de cupones de GoodRx.  - Si necesita que su receta se enve electrnicamente a una farmacia diferente, informe a nuestra oficina a travs de MyChart de [place] o por telfono llamando al [phone] y presione la opcin 4.

## 2023-12-25 ENCOUNTER — Other Ambulatory Visit: Payer: Self-pay | Admitting: Family Medicine

## 2023-12-25 DIAGNOSIS — J302 Other seasonal allergic rhinitis: Secondary | ICD-10-CM

## 2023-12-30 ENCOUNTER — Ambulatory Visit: Admitting: Dermatology

## 2024-01-01 ENCOUNTER — Ambulatory Visit: Admitting: Dermatology

## 2024-01-01 ENCOUNTER — Encounter: Payer: Self-pay | Admitting: Dermatology

## 2024-01-01 DIAGNOSIS — L2081 Atopic neurodermatitis: Secondary | ICD-10-CM

## 2024-01-01 DIAGNOSIS — L281 Prurigo nodularis: Secondary | ICD-10-CM

## 2024-01-01 DIAGNOSIS — L299 Pruritus, unspecified: Secondary | ICD-10-CM

## 2024-01-01 DIAGNOSIS — Z7189 Other specified counseling: Secondary | ICD-10-CM

## 2024-01-01 DIAGNOSIS — Z79899 Other long term (current) drug therapy: Secondary | ICD-10-CM | POA: Diagnosis not present

## 2024-01-01 MED ORDER — DUPILUMAB 300 MG/2ML ~~LOC~~ SOAJ
300.0000 mg | SUBCUTANEOUS | Status: AC
  Administered 2024-01-01 – 2024-01-15 (×2): 300 mg via SUBCUTANEOUS

## 2024-01-01 MED ORDER — DUPIXENT 300 MG/2ML ~~LOC~~ SOAJ: 300.0000 mg | SUBCUTANEOUS | 5 refills | Status: AC

## 2024-01-01 NOTE — Patient Instructions (Addendum)
 Continue Dupixent  injections every 2 weeks as directed.     Dupilumab  (Dupixent ) is a treatment given by injection for adults and children with moderate-to-severe atopic dermatitis. Goal is control of skin condition, not cure. It is given as 2 injections at the first dose followed by 1 injection ever 2 weeks thereafter.  Young children are dosed monthly.  Potential side effects include allergic reaction, herpes infections, injection site reactions and conjunctivitis (inflammation of the eyes).  The use of Dupixent  requires long term medication management, including periodic office visits.    Due to recent changes in healthcare laws, you may see results of your pathology and/or laboratory studies on MyChart before the doctors have had a chance to review them. We understand that in some cases there may be results that are confusing or concerning to you. Please understand that not all results are received at the same time and often the doctors may need to interpret multiple results in order to provide you with the best plan of care or course of treatment. Therefore, we ask that you please give us  2 business days to thoroughly review all your results before contacting the office for clarification. Should we see a critical lab result, you will be contacted sooner.   If You Need Anything After Your Visit  If you have any questions or concerns for your doctor, please call our main line at [phone] and press option 4 to reach your doctor's medical assistant. If no one answers, please leave a voicemail as directed and we will return your call as soon as possible. Messages left after 4 pm will be answered the following business day.   You may also send us  a message via MyChart. We typically respond to MyChart messages within 1-2 business days.  For prescription refills, please ask your pharmacy to contact our office. Our fax number is [phone].  If you have an urgent issue when the clinic is  closed that cannot wait until the next business day, you can page your doctor at the number below.    Please note that while we do our best to be available for urgent issues outside of office hours, we are not available 24/7.   If you have an urgent issue and are unable to reach us , you may choose to seek medical care at your doctor's office, retail clinic, urgent care center, or emergency room.  If you have a medical emergency, please immediately call 911 or go to the emergency department.  Pager Numbers  - Dr. Hester: [phone]  - Dr. Jackquline: [phone]  - Dr. Claudene: [phone]   In the event of inclement weather, please call our main line at [phone] for an update on the status of any delays or closures.  Dermatology Medication Tips: Please keep the boxes that topical medications come in in order to help keep track of the instructions about where and how to use these. Pharmacies typically print the medication instructions only on the boxes and not directly on the medication tubes.   If your medication is too expensive, please contact our office at [phone] option 4 or send us  a message through MyChart.   We are unable to tell what your co-pay for medications will be in advance as this is different depending on your insurance coverage. However, we may be able to find a substitute medication at lower cost or fill out paperwork to get insurance to cover a needed medication.   If a prior authorization is required to get your  medication covered by your insurance company, please allow us  1-2 business days to complete this process.  Drug prices often vary depending on where the prescription is filled and some pharmacies may offer cheaper prices.  The website www.goodrx.com contains coupons for medications through different pharmacies. The prices here do not account for what the cost may be with help from insurance (it may be cheaper with your insurance), but the website can  give you the price if you did not use any insurance.  - You can print the associated coupon and take it with your prescription to the pharmacy.  - You may also stop by our office during regular business hours and pick up a GoodRx coupon card.  - If you need your prescription sent electronically to a different pharmacy, notify our office through [facility] or by phone at [phone] option 4.     Si Usted Necesita Algo Despus de Su Visita  Tambin puede enviarnos un mensaje a travs de Clinical cytogeneticist. Por lo general respondemos a los mensajes de MyChart en el transcurso de 1 a 2 das hbiles.  Para renovar recetas, por favor pida a su farmacia que se ponga en contacto con nuestra oficina. Randi lakes de fax es [place] [phone].  Si tiene un asunto urgente cuando la clnica est cerrada y que no puede esperar hasta el siguiente da hbil, puede llamar/localizar a su doctor(a) al nmero que aparece a continuacin.   Por favor, tenga en cuenta que aunque hacemos todo lo posible para estar disponibles para asuntos urgentes fuera del horario de Disney, no estamos disponibles las 24 horas del da, los 7 [address]  [address] de la [place].   Si tiene un problema urgente y no puede comunicarse con nosotros, puede optar por buscar atencin mdica  en el consultorio de su doctor(a), en una clnica privada, en un centro de atencin urgente o en una sala de emergencias.  Si tiene Engineer, drilling, por favor llame inmediatamente al 911 o vaya a la sala de emergencias.  Nmeros de bper  - Dr. Hester: [phone]  - Dra. Jackquline: [phone]  - Dr. Claudene: [phone]   En caso de inclemencias del tiempo, por favor llame a landry capes principal al [phone] para una actualizacin sobre el [place] de cualquier retraso o cierre.  Consejos para la medicacin en dermatologa: Por favor, guarde las cajas en las que vienen los medicamentos de uso tpico para ayudarle a seguir las instrucciones sobre  dnde y cmo usarlos. Las farmacias generalmente imprimen las instrucciones del medicamento slo en las cajas y no directamente en los tubos del [place].   Si su medicamento es muy caro, por favor, pngase en contacto con landry rieger llamando al [phone] y presione la opcin 4 o envenos un mensaje a travs de Clinical cytogeneticist.   No podemos decirle cul ser su copago por los medicamentos por adelantado ya que esto es diferente dependiendo de la cobertura de su seguro. Sin embargo, es posible que podamos encontrar un medicamento sustituto a Audiological scientist un formulario para que el seguro cubra el medicamento que se considera necesario.   Si se requiere una autorizacin previa para que su compaa de seguros malta su medicamento, por favor permtanos de 1 a 2 das hbiles para completar este proceso.  Los precios de los medicamentos varan con frecuencia dependiendo del Environmental consultant de dnde se surte la receta y alguna farmacias pueden ofrecer precios ms baratos.  El sitio web www.goodrx.com tiene cupones para medicamentos de Health and safety inspector. Los  precios aqu no tienen en cuenta lo que podra costar con la ayuda del seguro (puede ser ms barato con su seguro), pero el sitio web puede darle el precio si no utiliz Tourist information centre manager.  - Puede imprimir el cupn correspondiente y llevarlo con su receta a la farmacia.  - Tambin puede pasar por nuestra oficina durante el horario de atencin regular y Education officer, museum una tarjeta de cupones de GoodRx.  - Si necesita que su receta se enve electrnicamente a una farmacia diferente, informe a nuestra oficina a travs de MyChart de [place] o por telfono llamando al [phone] y presione la opcin 4.

## 2024-01-01 NOTE — Progress Notes (Deleted)
 01/02/2024 8:31 AM   David Skinner Jul 16, 1950 [id number]  Referring provider: Glenard Mire, MD [address] [place],  [place] [address]  Urological history: 1. 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 ED - tadalafil  5 mg daily   No chief complaint on file.  HPI: David Skinner is a 73 y.o. man who presents today for bubbles in his urine.    Previous records reviewed.   UA ***  Lab Results  Component Value Date   HGBA1C 6.1 (A) 09/01/2023    PMH: Past Medical History:  Diagnosis Date   Allergy    Anxiety    Atopy    Cataract of right eye    [facility]   Chronic constipation    Elevated LFTs    Fatty liver    GERD (gastroesophageal reflux disease)    Hyperlipidemia    Hypertension    Hypoglycemia    Insomnia    Memory change    Overweight    Paresthesia of hand    Seizure (HCC)    12/12, 09/27/14, 02/28/16   Tenosynovitis of finger    Tinea cruris    Vitamin D  deficiency     Surgical History: Past Surgical History:  Procedure Laterality Date   COLONOSCOPY WITH PROPOFOL  N/A 03/25/2017   Procedure: COLONOSCOPY WITH PROPOFOL ;  Surgeon: Unk Corinn Skiff, MD;  Location: [facility];  Service: Endoscopy;  Laterality: N/A;   COLONOSCOPY WITH PROPOFOL  N/A 01/16/2021   Procedure: COLONOSCOPY WITH PROPOFOL ;  Surgeon: Therisa Bi, MD;  Location: [facility];  Service: Gastroenterology;  Laterality: N/A;  USE VIET[place]ESE VIDEO INTERPRETER   ESOPHAGOGASTRODUODENOSCOPY N/A 03/25/2017   Procedure: ESOPHAGOGASTRODUODENOSCOPY (EGD);  Surgeon: Unk Corinn Skiff, MD;  Location: [facility];  Service: Endoscopy;  Laterality: N/A;  Diabetic - oral meds   LASIK Bilateral     Home Medications:  Allergies as of 01/02/2024       Reactions   Ibuprofen Itching    Levofloxacin    Lisinopril Cough   Metoprolol     Nsaids Itching   Tolmetin Itching   Tramadol Hcl Itching   Tylenol  [acetaminophen ] Itching        Medication List        Accurate as of January 01, 2024  8:31 AM. If you have any questions, ask your nurse or doctor.          alfuzosin  10 MG 24 hr tablet Commonly known as: UROXATRAL  Take 1 tablet (10 mg total) by mouth daily with breakfast.   ASPIRIN  81 PO Take 1 tablet by mouth daily.   atorvastatin  80 MG tablet Commonly known as: LIPITOR Take 1 tablet (80 mg total) by mouth daily.   azelastine  0.1 % nasal spray Commonly known as: ASTELIN  Place 2 sprays into both nostrils 2 (two) times daily. Use in each nostril as directed   Combivent Respimat 20-100 MCG/ACT Aers respimat Generic drug: Ipratropium-Albuterol  Inhale 1 puff into the lungs every 6 (six) hours.   donepezil 10 MG tablet Commonly known as: ARICEPT Take 10 mg by mouth at bedtime.   DULoxetine  60 MG capsule Commonly known as: CYMBALTA  TAKE 1 CAPSULE BY MOUTH EVERY DAY   fluticasone  50 MCG/ACT nasal spray Commonly known as: FLONASE  Place 2 sprays into both nostrils daily.  levETIRAcetam  250 MG tablet Commonly known as: KEPPRA  Take 250 mg by mouth 1 day or 1 dose.   losartan  25 MG tablet Commonly known as: COZAAR  Take 1 tablet (25 mg total) by mouth daily.   memantine 5 MG tablet Commonly known as: NAMENDA Take 5 mg by mouth 2 (two) times daily.   mirabegron  ER 25 MG Tb24 tablet Commonly known as: Myrbetriq  Take 1 tablet (25 mg total) by mouth daily.   mometasone  0.1 % cream Commonly known as: ELOCON  Apply to aa's face and scalp BID PRN up to 5 days per week.   montelukast  10 MG tablet Commonly known as: SINGULAIR  Take 1 tablet (10 mg total) by mouth at bedtime.   tadalafil  5 MG tablet Commonly known as: CIALIS  Take 1 tablet (5 mg total) by mouth daily.   traZODone  100 MG tablet Commonly known as: DESYREL  Take 1 tablet (100 mg  total) by mouth at bedtime.        Allergies:  Allergies  Allergen Reactions   Ibuprofen Itching   Levofloxacin    Lisinopril Cough   Metoprolol     Nsaids Itching   Tolmetin Itching   Tramadol Hcl Itching   Tylenol  [Acetaminophen ] Itching    Family History: Family History  Problem Relation Age of Onset   Diabetes Mother    Heart disease Mother     Social History: See HPI for pertinent social history  ROS: Pertinent ROS in HPI  Physical Exam: There were no vitals taken for this visit.  Constitutional:  Well nourished. Alert and oriented, No acute distress. HEENT: [place] AT, moist mucus membranes.  Trachea midline, no masses. Cardiovascular: No clubbing, cyanosis, or edema. Respiratory: Normal respiratory effort, no increased work of breathing. GI: Abdomen is soft, non tender, non distended, no abdominal masses. Liver and spleen not palpable.  No hernias appreciated.  Stool sample for occult testing is not indicated.   GU: No CVA tenderness.  No bladder fullness or masses.  Patient with circumcised/uncircumcised phallus. ***Foreskin easily retracted***  Urethral meatus is patent.  No penile discharge. No penile lesions or rashes. Scrotum without lesions, cysts, rashes and/or edema.  Testicles are located scrotally bilaterally. No masses are appreciated in the testicles. Left and right epididymis are normal. Rectal: Patient with  normal sphincter tone. Anus and perineum without scarring or rashes. No rectal masses are appreciated. Prostate is approximately *** grams, *** nodules are appreciated. Seminal vesicles are normal. Skin: No rashes, bruises or suspicious lesions. Lymph: No cervical or inguinal adenopathy. Neurologic: Grossly intact, no focal deficits, moving all 4 extremities. Psychiatric: Normal mood and affect.  Laboratory Data: See EPIC and HPI  I have reviewed the labs.   Pertinent Imaging: N/A  Assessment & Plan:    1. Abnormal urine - UA ***  No  follow-ups on file.  These notes generated with voice recognition software. I apologize for typographical errors.  [name]  [facility] [address]  [address] [place], [place] [address] [phone]

## 2024-01-01 NOTE — Progress Notes (Signed)
   Follow-Up Visit   Subjective  David Skinner is a 73 y.o. male who presents for the following: Atopic neurodermatitis. 2 week follow up. Dupixent  loading dose given 12/16/2023. Using Mometasone  cream as directed. Patient reports the rash seems a little better he is still itching.  Has used mometasone  solution, Singulair , Allegra, ketoconazole  shampoo, doxycycline  50 mg and Clobetasol  solution without improvement.  Patient states he did notice dry eyes after the loading dose of Dupixent . Denies injection site reactions or any other side effects/adverse reactions.     The following portions of the chart were reviewed this encounter and updated as appropriate: medications, allergies, medical history  Review of Systems:  No other skin or systemic complaints except as noted in HPI or Assessment and Plan.  Objective  Well appearing patient in no apparent distress; mood and affect are within normal limits.  Areas Examined: Scalp and face   Relevant physical exam findings are noted in the Assessment and Plan.         Assessment & Plan   ATOPIC NEURODERMATITIS   Related Medications mometasone  (ELOCON ) 0.1 % cream Apply to aa's face and scalp BID PRN up to 5 days per week. 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  Some improvement noted since loading dose of Dupixent . Exam: Scaly pink papules coalescing to plaques 6% BSA  Chronic and persistent condition with duration or expected duration over one year. Condition is improving with treatment but not currently at goal.   Atopic dermatitis -  Severe, on Dupixent  (biologic medication).  Atopic dermatitis (eczema) is a chronic, relapsing, pruritic condition that can significantly affect quality of life. It is often associated with allergic rhinitis and/or asthma and can require treatment with topical medications, phototherapy, or in severe cases biologic medications, which require long term medication management.    Treatment Plan:  Continue Dupixent  300 mg/2mL SQ QOW. Patient denies side effects.  Dupixent  300mg /79mL injected SQ into the right upper arm. Patient tolerated injection well.   NDC: 9975-4084-79 Lot: [id number] Exp: 12/14/2025   Potential side effects include allergic reaction, herpes infections, injection site reactions and conjunctivitis (inflammation of the eyes).  The use of Dupixent  requires long term medication management, including periodic office visits.    Long term medication management.  Patient is using long term (months to years) prescription medication  to control their dermatologic condition.  These medications require periodic monitoring to evaluate for efficacy and side effects and may require periodic laboratory monitoring.   Recommend gentle skin care.   Return in about 2 weeks (around 01/15/2024) for Dupixent  Follow Up, Dupixent  injection.  I, Shayona Hibbitts, CMA, am acting as scribe for Alm Rhyme, MD.   Documentation: I have reviewed the above documentation for accuracy and completeness, and I agree with the above.  Alm Rhyme, MD

## 2024-01-02 ENCOUNTER — Ambulatory Visit: Admitting: Urology

## 2024-01-02 DIAGNOSIS — R8290 Unspecified abnormal findings in urine: Secondary | ICD-10-CM

## 2024-01-05 ENCOUNTER — Ambulatory Visit: Admitting: Family Medicine

## 2024-01-05 ENCOUNTER — Encounter: Payer: Self-pay | Admitting: Family Medicine

## 2024-01-05 VITALS — BP 126/76 | HR 74 | Resp 16 | Ht 65.0 in | Wt 152.7 lb

## 2024-01-05 DIAGNOSIS — I209 Angina pectoris, unspecified: Secondary | ICD-10-CM

## 2024-01-05 DIAGNOSIS — E1159 Type 2 diabetes mellitus with other circulatory complications: Secondary | ICD-10-CM

## 2024-01-05 DIAGNOSIS — I152 Hypertension secondary to endocrine disorders: Secondary | ICD-10-CM

## 2024-01-05 DIAGNOSIS — E559 Vitamin D deficiency, unspecified: Secondary | ICD-10-CM

## 2024-01-05 DIAGNOSIS — G40209 Localization-related (focal) (partial) symptomatic epilepsy and epileptic syndromes with complex partial seizures, not intractable, without status epilepticus: Secondary | ICD-10-CM

## 2024-01-05 DIAGNOSIS — J4540 Moderate persistent asthma, uncomplicated: Secondary | ICD-10-CM

## 2024-01-05 DIAGNOSIS — N400 Enlarged prostate without lower urinary tract symptoms: Secondary | ICD-10-CM

## 2024-01-05 DIAGNOSIS — R413 Other amnesia: Secondary | ICD-10-CM

## 2024-01-05 DIAGNOSIS — E1169 Type 2 diabetes mellitus with other specified complication: Secondary | ICD-10-CM | POA: Diagnosis not present

## 2024-01-05 DIAGNOSIS — L308 Other specified dermatitis: Secondary | ICD-10-CM

## 2024-01-05 DIAGNOSIS — F1020 Alcohol dependence, uncomplicated: Secondary | ICD-10-CM

## 2024-01-05 DIAGNOSIS — G4733 Obstructive sleep apnea (adult) (pediatric): Secondary | ICD-10-CM | POA: Diagnosis not present

## 2024-01-05 DIAGNOSIS — F5101 Primary insomnia: Secondary | ICD-10-CM

## 2024-01-05 DIAGNOSIS — F330 Major depressive disorder, recurrent, mild: Secondary | ICD-10-CM

## 2024-01-05 LAB — POCT GLYCOSYLATED HEMOGLOBIN (HGB A1C): Hemoglobin A1C: 6 % — AB (ref 4.0–5.6)

## 2024-01-05 NOTE — Patient Instructions (Addendum)
 Team Member Role and Visual merchandiser Info Address Start End Comments  Maree Jannett POUR, MD Consulting Physician (Neurology) Phone: [phone] Fax: [phone] [address] [facility] [address] [address] [facility] [address] [facility] [place] [address] 04/08/2018 - -   Team Member Role and Specialty Contact Info Address Start End Comments  Florencio Cara BIRCH, MD Consulting Physician (Cardiology) Phone: [phone] Fax: [phone] [address] [place] [address] 01/05/2024 - -   Go to pharmacy to take Tdap and also shingrix vaccine

## 2024-01-05 NOTE — Progress Notes (Signed)
 Name: David Skinner   MRN: [id number]    DOB: [birth year]   Date:01/05/2024       Progress Note  Subjective  Chief Complaint  Chief Complaint  Patient presents with   Medical Management of Chronic Issues   Discussed the use of AI scribe software for clinical note transcription with the patient, who gave verbal consent to proceed.  History of Present Illness David Skinner is a 73 year old male who presents for a follow-up visit. He is accompanied by his wife and an interpreter.  He has type 2 diabetes, which is well-controlled with a recent A1c of 6.0%, down from 6.1% in March. He manages his diabetes through diet alone, although he does not strictly adhere to dietary restrictions. He experiences frequent urination.  He has benign prostatic hyperplasia and is on Gemtesa  and alfuzosin . He experiences frequent urination with small amounts each time.  He has a history of angina pectoris associated with type 2 diabetes but currently experiences no chest pain or tightness during physical activity. His last cardiologist visit was in August of the previous year.  He has partial epilepsy with impairment of consciousness and is on Keppra  250 mg. He experiences memory issues, which may be related to his condition or alcohol use. He drinks alcohol socially once a week, consuming six to seven beers, and sometimes experiences memory blackouts.  He is on duloxetine  for depression but reports irritability and losing his temper easily. No feelings of sadness or being down.  He has obstructive sleep apnea but does not use a CPAP machine. He has atopic asthma with allergic rhinitis and is on Combivent, prednisone as needed, montelukast , and Dupixent  injections every two weeks, which have been helping with his symptoms.  He reports leg pain, particularly at night, affecting both legs, described as tendon pain rather than muscle pain, located on the top of the foot and ankle.  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    Patient Active Problem List   Diagnosis Date Noted   Extrinsic asthma without complication 09/22/2023   Angina pectoris associated with type 2 diabetes mellitus (HCC) 09/01/2023   Hyperlipidemia, mixed 07/02/2021   Coronary artery disease involving native coronary artery of native heart 07/02/2021   Alcoholism (HCC) 03/10/2020   OSA (obstructive sleep apnea) 12/09/2016   RLS (restless legs syndrome) 12/09/2016   Major depression in remission (HCC) 12/09/2016   Cervical radiculitis 01/09/2016   Lumbosacral radiculitis 01/09/2016   Osteoarthritis of carpometacarpal (CMC) joint of thumb 01/09/2016   Tinea cruris 03/21/2015   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) 03/09/2015   Chronic constipation 11/28/2014   Insomnia 11/28/2014   Dyslipidemia 11/28/2014   Fatty infiltration of liver 11/28/2014   Essential (primary) hypertension 11/28/2014   Gastric reflux 11/28/2014   Memory loss or impairment 11/28/2014   Perennial allergic rhinitis with seasonal variation 11/28/2014   Vitamin D  deficiency 11/28/2014   Tenosynovitis of thumb 11/28/2014   Partial epilepsy with impairment of consciousness (HCC) 11/16/2014    Past Surgical History:  Procedure Laterality Date   COLONOSCOPY WITH PROPOFOL  N/A 03/25/2017   Procedure: COLONOSCOPY WITH PROPOFOL ;  Surgeon: Unk Corinn Skiff, MD;  Location: [facility];  Service: Endoscopy;  Laterality: N/A;   COLONOSCOPY WITH PROPOFOL  N/A 01/16/2021   Procedure: COLONOSCOPY WITH PROPOFOL ;  Surgeon: Therisa Bi, MD;  Location: [facility];  Service: Gastroenterology;  Laterality: N/A;  USE [place] VIDEO INTERPRETER   ESOPHAGOGASTRODUODENOSCOPY N/A 03/25/2017   Procedure: ESOPHAGOGASTRODUODENOSCOPY (EGD);  Surgeon: Unk Corinn Skiff, MD;  Location: [facility];   Service: Endoscopy;  Laterality: N/A;  Diabetic - oral meds   LASIK Bilateral     Family History  Problem Relation Age of Onset   Diabetes Mother    Heart disease Mother     Social History   Tobacco Use   Smoking status: Former    Current packs/day: 0.00    Average packs/day: 1 pack/day for 15.4 years (15.4 ttl pk-yrs)    Types: Cigarettes    Start date: 06/17/1984    Quit date: 11/28/1999    Years since quitting: 24.1   Smokeless tobacco: Never  Substance Use Topics   Alcohol use: Not Currently    Alcohol/week: 10.0 standard drinks of alcohol    Types: 5 Cans of beer, 5 Shots of liquor per week     Current Outpatient Medications:    alfuzosin  (UROXATRAL ) 10 MG 24 hr tablet, Take 1 tablet (10 mg total) by mouth daily with breakfast., Disp: 30 tablet, Rfl: 11   ASPIRIN  81 PO, Take 1 tablet by mouth daily., Disp: , Rfl:    atorvastatin  (LIPITOR) 80 MG tablet, Take 1 tablet (80 mg total) by mouth daily., Disp: 90 tablet, Rfl: 1   azelastine  (ASTELIN ) 0.1 % nasal spray, Place 2 sprays into both nostrils 2 (two) times daily. Use in each nostril as directed, Disp: 60 mL, Rfl: 1   donepezil (ARICEPT) 10 MG tablet, Take 10 mg by mouth at bedtime., Disp: , Rfl:    DULoxetine  (CYMBALTA ) 60 MG capsule, TAKE 1 CAPSULE BY MOUTH EVERY DAY, Disp: 90 capsule, Rfl: 1   Dupilumab  (DUPIXENT ) 300 MG/2ML SOAJ, Inject 300 mg into the skin every 14 (fourteen) days., Disp: 4 mL, Rfl: 5   fluticasone  (FLONASE ) 50 MCG/ACT nasal spray, Place 2 sprays into both nostrils daily., Disp: 48 g, Rfl: 1   Ipratropium-Albuterol  (COMBIVENT RESPIMAT) 20-100 MCG/ACT AERS respimat, Inhale 1 puff into the lungs every 6 (six) hours., Disp: , Rfl:    levETIRAcetam  (KEPPRA ) 250 MG tablet, Take 250 mg by mouth 1 day or 1 dose., Disp: , Rfl:    losartan  (COZAAR ) 25 MG tablet, Take 1 tablet (25 mg total) by mouth daily., Disp: 90 tablet, Rfl: 1   memantine (NAMENDA) 5 MG tablet, Take 5 mg by mouth 2 (two) times daily., Disp:  , Rfl:    mirabegron  ER (MYRBETRIQ ) 25 MG TB24 tablet, Take 1 tablet (25 mg total) by mouth daily., Disp: 30 tablet, Rfl: 3   mometasone  (ELOCON ) 0.1 % cream, Apply to aa's face and scalp BID PRN up to 5 days per week., Disp: 45 g, Rfl: 3   montelukast  (SINGULAIR ) 10 MG tablet, Take 1 tablet (10 mg total) by mouth at bedtime., Disp: 90 tablet, Rfl: 1   tadalafil  (CIALIS ) 5 MG tablet, Take 1 tablet (5 mg total) by mouth daily., Disp: 30 tablet, Rfl: 11   traZODone  (DESYREL ) 100 MG tablet, Take 1 tablet (100 mg total) by mouth at bedtime., Disp: 90 tablet, Rfl: 0  Current Facility-Administered Medications:    dupilumab  (DUPIXENT ) prefilled syringe 300 mg, 300 mg, Subcutaneous, Q14 Days, Hester Alm BROCKS, MD   Dupilumab  SOAJ 300 mg, 300 mg, Subcutaneous, Q14 Days, Hester Alm BROCKS, MD, 300 mg at 01/01/24 1255  Allergies  Allergen Reactions   Ibuprofen Itching   Levofloxacin    Lisinopril  Cough   Metoprolol     Nsaids Itching   Tolmetin Itching   Tramadol Hcl Itching   Tylenol  [Acetaminophen ] Itching    I personally reviewed active problem list, medication list, allergies, family history with the patient/caregiver today.   ROS  Ten systems reviewed and is negative except as mentioned in HPI    Objective Physical Exam CONSTITUTIONAL: Patient appears well-developed and well-nourished. No distress. HEENT: Head atraumatic, normocephalic, neck supple. CARDIOVASCULAR: Normal rate, regular rhythm and normal heart sounds. No murmur heard. No BLE edema. PULMONARY: Effort normal and breath sounds normal. No respiratory distress. ABDOMINAL: There is no tenderness or distention. MUSCULOSKELETAL: Normal gait. Without gross motor or sensory deficit. High arch in foot. PSYCHIATRIC: Patient has a normal mood and affect. Behavior is normal. Judgment and thought content normal.  Vitals:   01/05/24 0940  BP: 126/76  Pulse: 74  Resp: 16  SpO2: 98%  Weight: 152 lb 11.2 oz (69.3 kg)  Height: 5'  5 (1.651 m)    Body mass index is 25.41 kg/m.  Recent Results (from the past 2160 hours)  POCT glycosylated hemoglobin (Hb A1C)     Status: Abnormal   Collection Time: 01/05/24  9:45 AM  Result Value Ref Range   Hemoglobin A1C 6.0 (A) 4.0 - 5.6 %   HbA1c POC (<> result, manual entry)     HbA1c, POC (prediabetic range)     HbA1c, POC (controlled diabetic range)      Diabetic Foot Exam:     PHQ2/9:    01/05/2024    9:38 AM 09/01/2023   10:27 AM 04/21/2023   10:01 AM 02/21/2023    1:19 PM 12/12/2022   10:02 AM  Depression screen PHQ 2/9  Decreased Interest 0 0 0 0 0  Down, Depressed, Hopeless 0 0 0 0 0  PHQ - 2 Score 0 0 0 0 0  Altered sleeping 0 0 1  3  Tired, decreased energy 0 0 3  3  Change in appetite 0 0 0  1  Feeling bad or failure about yourself  0 0 0  0  Trouble concentrating 0 0 0  1  Moving slowly or fidgety/restless 0 0 0  0  Suicidal thoughts 0 0 0  0  PHQ-9 Score 0 0 4  8  Difficult doing work/chores Not difficult at all Not difficult at all       phq 9 is negative  Fall Risk:    01/05/2024    9:33 AM 09/01/2023   10:28 AM 04/21/2023   10:01 AM 02/21/2023    1:08 PM 12/12/2022   10:02 AM  Fall Risk   Falls in the past year? 0 1 0 1 0  Number falls in past yr: 0 0 0 0 0  Injury with Fall? 0 0 0 1 0  Risk for fall due to : No Fall Risks Impaired balance/gait No Fall Risks No Fall Risks No Fall Risks  Follow up Falls evaluation completed Education provided;Falls evaluation completed;Falls prevention discussed Falls prevention discussed Education provided;Falls prevention discussed Falls prevention discussed     Assessment & Plan Type 2 diabetes mellitus with associated hyperlipidemia and hypertension Diabetes well-controlled with A1c 6.0%. Hyperlipidemia managed with atorvastatin ; leg pain unlikely related. Hypertension managed with losartan . - Continue diet control for diabetes. - Continue atorvastatin  for hyperlipidemia. - Continue losartan  for  hypertension. - Monitor for muscle pain related to atorvastatin .  Coronary artery disease with mild LAD stenosis Mild obstructive coronary disease with 25-49%  LAD stenosis. - Schedule follow-up with cardiologist in two months.  Epilepsy, focal onset with impaired awareness On Keppra  250 mg. Reports memory issues possibly related to neurocognitive disorder or alcohol use. - Schedule follow-up with neurologist. - Continue Keppra  for seizure management.  Neurocognitive disorder with memory loss Memory loss possibly exacerbated by alcohol use. On Namenda and Donepezil. - Use a pill box to manage medication adherence. - Schedule follow-up with neurologist.  Alcohol use disorder Social drinking once a week with large consumption causing memory blackouts. May affect memory and depression. - Discuss reducing alcohol intake to improve memory and depression management.  Major depressive disorder with irritability Irritability and temper loss. On duloxetine ; alcohol may affect metabolism. - Continue duloxetine  for depression. - Encourage non-confrontational coping strategies for irritability.  Benign prostatic hyperplasia with lower urinary tract symptoms Frequent urination with small amounts. On Gemtesa  and alfuzosin  with minimal improvement. - Follow up with urologist for BPH management.  Obstructive sleep apnea Not using CPAP. Managed by pulmonologist.  Allergic persistent asthma Managed with Combivent, prednisone, montelukast , and Dupixent . - Continue current asthma management regimen. - Ensure Dupixent  injections every two weeks.  Atopic neurodermatitis with pruritus, right face Managed with topical treatments and Dupixent . - Continue Dupixent  injections every two weeks. - Continue topical treatments as prescribed.  Bilateral foot pain, likely mechanical Pain on top of feet, possibly due to high arches or improper footwear. More pronounced at night. - Use topical Voltaren gel  and Tylenol  for pain management. - Consider visiting [facility] for shoe fitting and assessment.

## 2024-01-06 ENCOUNTER — Encounter: Payer: Self-pay | Admitting: Dermatology

## 2024-01-06 ENCOUNTER — Ambulatory Visit: Payer: Self-pay | Admitting: Family Medicine

## 2024-01-06 LAB — CBC WITH DIFFERENTIAL/PLATELET
Absolute Lymphocytes: 1544 {cells}/uL (ref 850–3900)
Absolute Monocytes: 620 {cells}/uL (ref 200–950)
Basophils Absolute: 37 {cells}/uL (ref 0–200)
Basophils Relative: 0.6 %
Eosinophils Absolute: 81 {cells}/uL (ref 15–500)
Eosinophils Relative: 1.3 %
HCT: 49.8 % (ref 38.5–50.0)
Hemoglobin: 16 g/dL (ref 13.2–17.1)
MCH: 31.5 pg (ref 27.0–33.0)
MCHC: 32.1 g/dL (ref 32.0–36.0)
MCV: 98 fL (ref 80.0–100.0)
MPV: 10.4 fL (ref 7.5–12.5)
Monocytes Relative: 10 %
Neutro Abs: 3918 {cells}/uL (ref 1500–7800)
Neutrophils Relative %: 63.2 %
Platelets: 140 Thousand/uL (ref 140–400)
RBC: 5.08 Million/uL (ref 4.20–5.80)
RDW: 12.5 % (ref 11.0–15.0)
Total Lymphocyte: 24.9 %
WBC: 6.2 Thousand/uL (ref 3.8–10.8)

## 2024-01-06 LAB — COMPREHENSIVE METABOLIC PANEL WITH GFR
AG Ratio: 1.6 (calc) (ref 1.0–2.5)
ALT: 44 U/L (ref 9–46)
AST: 26 U/L (ref 10–35)
Albumin: 4.5 g/dL (ref 3.6–5.1)
Alkaline phosphatase (APISO): 60 U/L (ref 35–144)
BUN: 12 mg/dL (ref 7–25)
CO2: 31 mmol/L (ref 20–32)
Calcium: 9.7 mg/dL (ref 8.6–10.3)
Chloride: 103 mmol/L (ref 98–110)
Creat: 0.95 mg/dL (ref 0.70–1.28)
Globulin: 2.9 g/dL (ref 1.9–3.7)
Glucose, Bld: 113 mg/dL — ABNORMAL HIGH (ref 65–99)
Potassium: 4.2 mmol/L (ref 3.5–5.3)
Sodium: 140 mmol/L (ref 135–146)
Total Bilirubin: 0.7 mg/dL (ref 0.2–1.2)
Total Protein: 7.4 g/dL (ref 6.1–8.1)
eGFR: 86 mL/min/1.73m2 (ref >=60)

## 2024-01-06 LAB — LIPID PANEL
Cholesterol: 179 mg/dL (ref <200)
HDL: 70 mg/dL (ref >=40)
LDL Cholesterol (Calc): 84 mg/dL
Non-HDL Cholesterol (Calc): 109 mg/dL (ref <130)
Total CHOL/HDL Ratio: 2.6 (calc) (ref <5.0)
Triglycerides: 153 mg/dL — ABNORMAL HIGH (ref <150)

## 2024-01-06 LAB — MICROALBUMIN / CREATININE URINE RATIO
Creatinine, Urine: 166 mg/dL (ref 20–320)
Microalb Creat Ratio: 4 mg/g{creat} (ref <30)
Microalb, Ur: 0.7 mg/dL

## 2024-01-06 NOTE — Progress Notes (Signed)
 Lipid panel showed improvement of bad cholesterol  Kidney and liver function tests are within normal limits  Normal urine micro White count back to normal range and also HCT

## 2024-01-09 ENCOUNTER — Encounter: Payer: Self-pay | Admitting: Urology

## 2024-01-15 ENCOUNTER — Ambulatory Visit: Admitting: Dermatology

## 2024-01-15 ENCOUNTER — Encounter: Payer: Self-pay | Admitting: Dermatology

## 2024-01-15 DIAGNOSIS — L2081 Atopic neurodermatitis: Secondary | ICD-10-CM | POA: Diagnosis not present

## 2024-01-15 DIAGNOSIS — Z79899 Other long term (current) drug therapy: Secondary | ICD-10-CM

## 2024-01-15 DIAGNOSIS — Z7189 Other specified counseling: Secondary | ICD-10-CM

## 2024-01-15 DIAGNOSIS — L281 Prurigo nodularis: Secondary | ICD-10-CM

## 2024-01-15 NOTE — Patient Instructions (Signed)

## 2024-01-15 NOTE — Progress Notes (Signed)
   Follow-Up Visit   Subjective  Jarrett Dequann Vandervelden is a 73 y.o. male who presents for the following: Atopic neurodermatitis. 2 week follow up. Dupixent  loading dose given 12/16/2023. Using Mometasone  cream as directed. Patient reports the rash seems a little better he is still itching.   Has used mometasone  solution, Singulair , Allegra, ketoconazole  shampoo, doxycycline  50 mg and Clobetasol  solution without improvement.   Patient states he did notice dry eyes after the loading dose of Dupixent . Denies injection site reactions or any other side effects/adverse reactions.   The following portions of the chart were reviewed this encounter and updated as appropriate: medications, allergies, medical history  Review of Systems:  No other skin or systemic complaints except as noted in HPI or Assessment and Plan.  Objective  Well appearing patient in no apparent distress; mood and affect are within normal limits.  Areas Examined: Scalp and face   Relevant physical exam findings are noted in the Assessment and Plan.         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   Some improvement noted since loading dose of Dupixent . Patient feels he is improving.  Exam: Scaly pink papules coalescing to plaques 6% BSA involving face  Chronic and persistent condition with duration or expected duration over one year. Condition is improving with treatment but not currently at goal.  Atopic dermatitis - Severe, on Dupixent  (biologic medication).  Atopic dermatitis (eczema) is a chronic, relapsing, pruritic condition that can significantly affect quality of life. It is often associated with allergic rhinitis and/or asthma and can require treatment with topical medications, phototherapy, or in  severe cases biologic medications, which require long term medication management.    Treatment Plan: Continue Dupixent  300 mg/2mL SQ QOW. Patient denies side effects.  Dupixent  300mg /89mL injected SQ into the L upper arm. Patient tolerated injection well.   NDC: 9975-4085-97 Lot: [id number] Exp: 09/15/2025   Potential side effects include allergic reaction, herpes infections, injection site reactions and conjunctivitis (inflammation of the eyes).  The use of Dupixent  requires long term medication management, including periodic office visits.    Long term medication management.  Patient is using long term (months to years) prescription medication  to control their dermatologic condition.  These medications require periodic monitoring to evaluate for efficacy and side effects and may require periodic laboratory monitoring.   Recommend gentle skin care.  Return in about 2 weeks (around 01/29/2024) for Dupixent  injection training with medical assistant; 3 mths f/u with Dr. LOIS FERNS, Kate Fought, CMA, am acting as scribe for Alm Rhyme, MD.   Documentation: I have reviewed the above documentation for accuracy and completeness, and I agree with the above.  Alm Rhyme, MD

## 2024-01-23 ENCOUNTER — Emergency Department
Admission: EM | Admit: 2024-01-23 | Discharge: 2024-01-23 | Disposition: A | Discharge status: Home / Self Care | Attending: Emergency Medicine | Admitting: Emergency Medicine

## 2024-01-23 DIAGNOSIS — L02811 Cutaneous abscess of head [any part, except face]: Secondary | ICD-10-CM | POA: Diagnosis not present

## 2024-01-23 DIAGNOSIS — I2510 Atherosclerotic heart disease of native coronary artery without angina pectoris: Secondary | ICD-10-CM | POA: Insufficient documentation

## 2024-01-23 DIAGNOSIS — L0291 Cutaneous abscess, unspecified: Secondary | ICD-10-CM

## 2024-01-23 DIAGNOSIS — E1142 Type 2 diabetes mellitus with diabetic polyneuropathy: Secondary | ICD-10-CM | POA: Diagnosis not present

## 2024-01-23 DIAGNOSIS — J45909 Unspecified asthma, uncomplicated: Secondary | ICD-10-CM | POA: Insufficient documentation

## 2024-01-23 DIAGNOSIS — R220 Localized swelling, mass and lump, head: Secondary | ICD-10-CM | POA: Diagnosis present

## 2024-01-23 DIAGNOSIS — I10 Essential (primary) hypertension: Secondary | ICD-10-CM | POA: Insufficient documentation

## 2024-01-23 MED ORDER — DOXYCYCLINE HYCLATE 100 MG PO TABS: 100.0000 mg | ORAL_TABLET | Freq: Two times a day (BID) | ORAL | 0 refills | Status: DC

## 2024-01-23 MED ORDER — LIDOCAINE-EPINEPHRINE-TETRACAINE (LET) TOPICAL GEL
3.0000 mL | Freq: Once | TOPICAL | Status: AC
  Administered 2024-01-23: 3 mL via TOPICAL
  Filled 2024-01-23: qty 3

## 2024-01-23 NOTE — ED Triage Notes (Signed)
 Pt to ED from home with lesion on the back of his head of unknown origin. Pt denies recent injury and states it just popped up and started hurting a few days ago. A&O x4, denies fevers.

## 2024-01-23 NOTE — ED Provider Notes (Signed)
 [facility] Provider Note    Event Date/Time   First MD Initiated Contact with Patient 01/23/24 1600     (approximate)   History   Abscess    HPI  David Skinner is a 73 y.o. male    with a past medical history of dyslipidemia, prurigo nodularis, atopic neurodermatitis, asthma, benign prostatic hyperplasia, with urinary retention, rectal dysfunction hypertension coronary artery disease, dizziness, diabetes mellitus type 2 mild cognitive impairment, seizures, who presents to the ED complaining of lesion on his back. According to the patient, he noticed presence of lesion on the back of his head, tenderness.  Patient denies fever, he is not taking any medication.  I used interpreter.  Patient is here by himself     Patient Active Problem List   Diagnosis Date Noted   Asthma with allergic rhinitis, moderate persistent, uncomplicated 01/05/2024   BPH with elevated PSA 01/05/2024   Extrinsic asthma without complication 09/22/2023   Angina pectoris associated with type 2 diabetes mellitus (HCC) 09/01/2023   Hyperlipidemia, mixed 07/02/2021   Coronary artery disease involving native coronary artery of native heart 07/02/2021   Alcoholism (HCC) 03/10/2020   OSA (obstructive sleep apnea) 12/09/2016   RLS (restless legs syndrome) 12/09/2016   Major depression in remission (HCC) 12/09/2016   Cervical radiculitis 01/09/2016   Lumbosacral radiculitis 01/09/2016   Osteoarthritis of carpometacarpal (CMC) joint of thumb 01/09/2016   Tinea cruris 03/21/2015   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) 03/09/2015   Chronic constipation 11/28/2014   Insomnia 11/28/2014   Dyslipidemia 11/28/2014   Fatty infiltration of liver 11/28/2014   Essential (primary) hypertension 11/28/2014   Gastric reflux 11/28/2014   Memory loss or impairment 11/28/2014   Perennial allergic rhinitis with seasonal variation 11/28/2014   Vitamin D  deficiency  11/28/2014   Tenosynovitis of thumb 11/28/2014   Partial epilepsy with impairment of consciousness (HCC) 11/16/2014     ROS: Patient currently denies any vision changes, tinnitus, difficulty speaking, facial droop, sore throat, chest pain, shortness of breath, abdominal pain, nausea/vomiting/diarrhea, dysuria, or weakness/numbness/paresthesias in any extremity   Physical Exam   Triage Vital Signs: ED Triage Vitals [01/23/24 1437]  Encounter Vitals Group     BP 137/83     Girls Systolic BP Percentile      Girls Diastolic BP Percentile      Boys Systolic BP Percentile      Boys Diastolic BP Percentile      Pulse Rate 95     Resp 15     Temp 98.2 F (36.8 C)     Temp Source Oral     SpO2 97 %     Weight      Height      Head Circumference      Peak Flow      Pain Score 6     Pain Loc      Pain Education      Exclude from Growth Chart     Most recent vital signs: Vitals:   01/23/24 1437  BP: 137/83  Pulse: 95  Resp: 15  Temp: 98.2 F (36.8 C)  SpO2: 97%     Physical Exam Vitals and nursing note reviewed.  Vital signs were normal during triage  Constitutional:      General: Awake and alert. No acute distress.    Appearance: Normal appearance. The patient is normal weight.      Able to speak in complete sentences without  cough or dyspnea  HENT:     Head: Normocephalic and atraumatic.     Mouth: Mucous membranes are moist.  Eyes:     General: PERRL. Normal EOMs          Conjunctiva/sclera: Conjunctivae normal.  Nose No congestion/rhinorrhea  CV:                  Good peripheral perfusion.  Regular rate and rhythm  Resp:               Normal effort.  Equal breath sounds bilaterally.  Abd:                 No distention.  Soft, nontender.  No rebound or guarding.  Musculoskeletal:        General: No swelling. Normal range of motion.  Skin:    General: Skin is warm and dry.     Capillary Refill: Capillary refill takes less than 2 seconds.     Findings: No  rash.  Left occipital area: Presence of abscess about 1 cm of diameter, presence of purulent drainage. Neurological:     Mental Status: The patient is awake and alert. MAE spontaneously. No gross focal neurologic deficits are appreciated.  Psychiatric Mood and affect are normal. Speech and behavior are normal.  ED Results / Procedures / Treatments   Labs (all labs ordered are listed, but only abnormal results are displayed) Labs Reviewed - No data to display   EKG    RADIOLOGY    PROCEDURES:  Critical Care performed:   .Incision and Drainage  Date/Time: 01/23/2024 5:53 PM  Performed by: Janit Kast, PA-C Authorized by: Janit Kast, PA-C   Consent:    Consent obtained:  Verbal   Consent given by:  Patient   Risks, benefits, and alternatives were discussed: yes     Risks discussed:  Pain Universal protocol:    Procedure explained and questions answered to patient or proxy's satisfaction: yes     Patient identity confirmed:  Verbally with patient Location:    Type:  Abscess   Location:  Head   Head location:  Scalp Pre-procedure details:    Skin preparation:  Povidone-iodine Sedation:    Sedation type:  None Anesthesia:    Anesthesia method:  Topical application   Topical anesthetic:  LET Procedure type:    Complexity:  Simple Procedure details:    Ultrasound guidance: no     Needle aspiration: no     Incision types:  Stab incision   Drainage:  Purulent and bloody   Drainage amount:  Scant   Packing materials:  None Post-procedure details:    Procedure completion:  Tolerated well, no immediate complications    MEDICATIONS ORDERED IN ED: Medications  lidocaine -EPINEPHrine -tetracaine  (LET) topical gel (has no administration in time range)      IMPRESSION / MDM / ASSESSMENT AND PLAN / ED COURSE  I reviewed the triage vital signs and the nursing notes.  Differential diagnosis includes, but is not limited to, abscess, cellulitis, unlikely  foreign body  Patient's presentation is most consistent with acute, uncomplicated illness.    David Skinner is a 73 y.o., male presents today with history of abscess on the left occipital area without fever.  On physical exam there is presence of round mass on the left occipital area, tender, with purulent secretion.  Plan LET Incision and drainage Doxycycline  Patient's diagnosis is consistent with abscess  I did know order imaging or labs, physical exam was  reassuring. I did review the patient's allergies and medications.The patient is in stable and satisfactory condition for discharge home  Patient will be discharged home with prescriptions for doxycycline . Patient is to follow up with PCP as needed or otherwise directed. Patient is given ED precautions to return to the ED for any worsening or new symptoms. Discussed plan of care with patient, answered all of patient's questions, Patient agreeable to plan of care. Advised patient to take medications according to the instructions on the label. Discussed possible side effects of new medications. Patient verbalized understanding. I used the interpreter  FINAL CLINICAL IMPRESSION(S) / ED DIAGNOSES   Final diagnoses:  Abscess     Rx / DC Orders   ED Discharge Orders          Ordered    doxycycline  (VIBRA -TABS) 100 MG tablet  2 times daily        01/23/24 1745             Note:  This document was prepared using Dragon voice recognition software and may include unintentional dictation errors.   Janit Kast, PA-C 01/23/24 [id number]    Claudene Rover, MD 01/23/24 [phone]

## 2024-01-23 NOTE — Discharge Instructions (Addendum)
 You have been diagnosed with abscess on your left occipital area.  Please take doxycycline  1 tablet by mouth every 12 hours for 10 days after main meals.  Please come back to ED or go to your PCP if you have new symptoms or symptoms worsen.

## 2024-01-29 ENCOUNTER — Ambulatory Visit: Admitting: Dermatology

## 2024-01-29 ENCOUNTER — Ambulatory Visit

## 2024-02-02 ENCOUNTER — Ambulatory Visit (INDEPENDENT_AMBULATORY_CARE_PROVIDER_SITE_OTHER): Admitting: Family Medicine

## 2024-02-02 ENCOUNTER — Encounter: Payer: Self-pay | Admitting: Family Medicine

## 2024-02-02 VITALS — BP 128/82 | HR 95 | Resp 16 | Ht 65.0 in | Wt 153.6 lb

## 2024-02-02 DIAGNOSIS — F325 Major depressive disorder, single episode, in full remission: Secondary | ICD-10-CM

## 2024-02-02 DIAGNOSIS — J4540 Moderate persistent asthma, uncomplicated: Secondary | ICD-10-CM | POA: Diagnosis not present

## 2024-02-02 DIAGNOSIS — L02811 Cutaneous abscess of head [any part, except face]: Secondary | ICD-10-CM

## 2024-02-02 MED ORDER — DULOXETINE HCL 60 MG PO CPEP: 60.0000 mg | ORAL_CAPSULE | Freq: Every day | ORAL | 0 refills | Status: DC

## 2024-02-02 NOTE — Progress Notes (Signed)
 Name: David Skinner   MRN: [id number]    DOB: 20-May-1951   Date:02/02/2024       Progress Note  Subjective  Chief Complaint  Chief Complaint  Patient presents with   Abscess    Went to ER 01/23/24 was given doxy   Discussed the use of AI scribe software for clinical note transcription with the patient, who gave verbal consent to proceed.  History of Present Illness David Skinner is a 73 year old male who presents for follow-up after an emergency room visit for an occipital abscess.  He presents for follow-up after an emergency room visit on August 8th for an occipital abscess. The abscess had been growing for about one week prior to the visit, starting as a small bump that became increasingly painful. It did not drain at home, prompting the visit to the emergency room where it was drained. He was prescribed doxycycline , which he completed, and reports significant improvement in pain. The area remains slightly swollen and itchy.  He has a history of asthma, for which he uses a spray inhaler prescribed by a pulmonologist. He uses the inhaler two to three times a day as needed, which helps with his breathing. He also takes Singulair  and receives Dupixent  injections for his skin but should also help with asthma . He experiences tiredness and difficulty breathing when exerting himself but stable over time.  No fever, chills, or recent wheezing. He is currently taking Combivent, which he finds helpful for his breathing. He has a follow-up with his pulmonologist in the next couple of months    Patient Active Problem List   Diagnosis Date Noted   Asthma with allergic rhinitis, moderate persistent, uncomplicated 01/05/2024   BPH with elevated PSA 01/05/2024   Extrinsic asthma without complication 09/22/2023   Angina pectoris associated with type 2 diabetes mellitus (HCC) 09/01/2023   Hyperlipidemia, mixed 07/02/2021   Coronary artery disease involving native coronary artery of native heart  07/02/2021   Alcoholism (HCC) 03/10/2020   OSA (obstructive sleep apnea) 12/09/2016   RLS (restless legs syndrome) 12/09/2016   Major depression in remission (HCC) 12/09/2016   Cervical radiculitis 01/09/2016   Lumbosacral radiculitis 01/09/2016   Osteoarthritis of carpometacarpal (CMC) joint of thumb 01/09/2016   Tinea cruris 03/21/2015   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) 03/09/2015   Chronic constipation 11/28/2014   Insomnia 11/28/2014   Dyslipidemia 11/28/2014   Fatty infiltration of liver 11/28/2014   Essential (primary) hypertension 11/28/2014   Gastric reflux 11/28/2014   Memory loss or impairment 11/28/2014   Perennial allergic rhinitis with seasonal variation 11/28/2014   Vitamin D  deficiency 11/28/2014   Tenosynovitis of thumb 11/28/2014   Partial epilepsy with impairment of consciousness (HCC) 11/16/2014    Past Surgical History:  Procedure Laterality Date   COLONOSCOPY WITH PROPOFOL  N/A 03/25/2017   Procedure: COLONOSCOPY WITH PROPOFOL ;  Surgeon: Unk Corinn Skiff, MD;  Location: [facility];  Service: Endoscopy;  Laterality: N/A;   COLONOSCOPY WITH PROPOFOL  N/A 01/16/2021   Procedure: COLONOSCOPY WITH PROPOFOL ;  Surgeon: Therisa Bi, MD;  Location: [facility];  Service: Gastroenterology;  Laterality: N/A;  USE [place]ESE VIDEO INTERPRETER   ESOPHAGOGASTRODUODENOSCOPY N/A 03/25/2017   Procedure: ESOPHAGOGASTRODUODENOSCOPY (EGD);  Surgeon: Unk Corinn Skiff, MD;  Location: [facility];  Service: Endoscopy;  Laterality: N/A;  Diabetic - oral meds   LASIK Bilateral     Family History  Problem Relation Age of Onset   Diabetes Mother  Heart disease Mother     Social History   Tobacco Use   Smoking status: Former    Current packs/day: 0.00    Average packs/day: 1 pack/day for 15.4 years (15.4 ttl pk-yrs)    Types: Cigarettes    Start date: 06/17/1984    Quit date: 11/28/1999    Years since quitting:  24.1   Smokeless tobacco: Never  Substance Use Topics   Alcohol use: Not Currently    Alcohol/week: 10.0 standard drinks of alcohol    Types: 5 Cans of beer, 5 Shots of liquor per week     Current Outpatient Medications:    alfuzosin  (UROXATRAL ) 10 MG 24 hr tablet, Take 1 tablet (10 mg total) by mouth daily with breakfast., Disp: 30 tablet, Rfl: 11   ASPIRIN  81 PO, Take 1 tablet by mouth daily., Disp: , Rfl:    atorvastatin  (LIPITOR) 80 MG tablet, Take 1 tablet (80 mg total) by mouth daily., Disp: 90 tablet, Rfl: 1   azelastine  (ASTELIN ) 0.1 % nasal spray, Place 2 sprays into both nostrils 2 (two) times daily. Use in each nostril as directed, Disp: 60 mL, Rfl: 1   donepezil (ARICEPT) 10 MG tablet, Take 10 mg by mouth at bedtime., Disp: , Rfl:    doxycycline  (VIBRA -TABS) 100 MG tablet, Take 1 tablet (100 mg total) by mouth 2 (two) times daily for 10 days., Disp: 20 tablet, Rfl: 0   DULoxetine  (CYMBALTA ) 60 MG capsule, TAKE 1 CAPSULE BY MOUTH EVERY DAY, Disp: 90 capsule, Rfl: 1   Dupilumab  (DUPIXENT ) 300 MG/2ML SOAJ, Inject 300 mg into the skin every 14 (fourteen) days., Disp: 4 mL, Rfl: 5   fluticasone  (FLONASE ) 50 MCG/ACT nasal spray, Place 2 sprays into both nostrils daily., Disp: 48 g, Rfl: 1   Ipratropium-Albuterol  (COMBIVENT RESPIMAT) 20-100 MCG/ACT AERS respimat, Inhale 1 puff into the lungs every 6 (six) hours., Disp: , Rfl:    levETIRAcetam  (KEPPRA ) 250 MG tablet, Take 250 mg by mouth 1 day or 1 dose., Disp: , Rfl:    losartan  (COZAAR ) 25 MG tablet, Take 1 tablet (25 mg total) by mouth daily., Disp: 90 tablet, Rfl: 1   memantine (NAMENDA) 5 MG tablet, Take 5 mg by mouth 2 (two) times daily., Disp: , Rfl:    mirabegron  ER (MYRBETRIQ ) 25 MG TB24 tablet, Take 1 tablet (25 mg total) by mouth daily., Disp: 30 tablet, Rfl: 3   mometasone  (ELOCON ) 0.1 % cream, Apply to aa's face and scalp BID PRN up to 5 days per week., Disp: 45 g, Rfl: 3   montelukast  (SINGULAIR ) 10 MG tablet, Take 1 tablet  (10 mg total) by mouth at bedtime., Disp: 90 tablet, Rfl: 1   tadalafil  (CIALIS ) 5 MG tablet, Take 1 tablet (5 mg total) by mouth daily., Disp: 30 tablet, Rfl: 11   traZODone  (DESYREL ) 100 MG tablet, Take 1 tablet (100 mg total) by mouth at bedtime., Disp: 90 tablet, Rfl: 0  Current Facility-Administered Medications:    dupilumab  (DUPIXENT ) prefilled syringe 300 mg, 300 mg, Subcutaneous, Q14 Days, Hester Alm BROCKS, MD   Dupilumab  SOAJ 300 mg, 300 mg, Subcutaneous, Q14 Days, Hester Alm BROCKS, MD, 300 mg at 01/15/24 1120  Allergies  Allergen Reactions   Ibuprofen Itching   Levofloxacin    Lisinopril Cough   Metoprolol     Nsaids Itching   Tolmetin Itching   Tramadol Hcl Itching   Tylenol  [Acetaminophen ] Itching    I personally reviewed active problem list, medication list, allergies with the patient/caregiver  today.   ROS  Ten systems reviewed and is negative except as mentioned in HPI    Objective Physical Exam CONSTITUTIONAL: Patient appears well-developed and well-nourished. No distress. HEENT: Head atraumatic, normocephalic, neck supple. Abscess well healed left nuchal area - some redness, scarring present CARDIOVASCULAR: Normal rate, regular rhythm and normal heart sounds. No murmur heard. No BLE edema. PULMONARY: Effort normal and breath sounds normal. Lungs clear to auscultation. No respiratory distress. PSYCHIATRIC: Patient has a normal mood and affect. Behavior is normal. Judgment and thought content normal.  Vitals:   02/02/24 0959  BP: 128/82  Pulse: 95  Resp: 16  SpO2: 98%  Weight: 153 lb 9.6 oz (69.7 kg)  Height: 5' 5 (1.651 m)    Body mass index is 25.56 kg/m.  Recent Results (from the past 2160 hours)  POCT glycosylated hemoglobin (Hb A1C)     Status: Abnormal   Collection Time: 01/05/24  9:45 AM  Result Value Ref Range   Hemoglobin A1C 6.0 (A) 4.0 - 5.6 %   HbA1c POC (<> result, manual entry)     HbA1c, POC (prediabetic range)     HbA1c, POC  (controlled diabetic range)    Lipid panel     Status: Abnormal   Collection Time: 01/05/24 10:29 AM  Result Value Ref Range   Cholesterol 179 <200 mg/dL   HDL 70 > OR = 40 mg/dL   Triglycerides 846 (H) <150 mg/dL   LDL Cholesterol (Calc) 84 mg/dL (calc)    Comment: Reference range: <100 . Desirable range <100 mg/dL for primary prevention;   <70 mg/dL for patients with CHD or diabetic patients  with > or = 2 CHD risk factors. SABRA LDL-C is now calculated using the Martin-Hopkins  calculation, which is a validated novel method providing  better accuracy than the Friedewald equation in the  estimation of LDL-C.  Gladis APPLETHWAITE et al. SANDREA. 7986;689(80): 2061-2068  (http://education.[facility].com/faq/FAQ164)    Total CHOL/HDL Ratio 2.6 <5.0 (calc)   Non-HDL Cholesterol (Calc) 109 <130 mg/dL (calc)    Comment: For patients with diabetes plus 1 major ASCVD risk  factor, treating to a non-HDL-C goal of <100 mg/dL  (LDL-C of <29 mg/dL) is considered a therapeutic  option.   Microalbumin / creatinine urine ratio     Status: None   Collection Time: 01/05/24 10:29 AM  Result Value Ref Range   Creatinine, Urine 166 20 - 320 mg/dL   Microalb, Ur 0.7 mg/dL    Comment: Reference Range Not established    Microalb Creat Ratio 4 <30 mg/g creat    Comment: . The ADA defines abnormalities in albumin excretion as follows: SABRA Albuminuria Category        Result (mg/g creatinine) . Normal to Mildly increased   <30 Moderately increased         30-299  Severely increased           > OR = 300 . The ADA recommends that at least two of three specimens collected within a 3-6 month period be abnormal before considering a patient to be within a diagnostic category.   CBC with Differential/Platelet     Status: None   Collection Time: 01/05/24 10:29 AM  Result Value Ref Range   WBC 6.2 3.8 - 10.8 Thousand/uL   RBC 5.08 4.20 - 5.80 Million/uL   Hemoglobin 16.0 13.2 - 17.1 g/dL   HCT 50.1 61.4 -  49.9 %   MCV 98.0 80.0 - 100.0 fL   MCH 31.5  27.0 - 33.0 pg   MCHC 32.1 32.0 - 36.0 g/dL    Comment: For adults, a slight decrease in the calculated MCHC value (in the range of 30 to 32 g/dL) is most likely not clinically significant; however, it should be interpreted with caution in correlation with other red cell parameters and the patient's clinical condition.    RDW 12.5 11.0 - 15.0 %   Platelets 140 140 - 400 Thousand/uL   MPV 10.4 7.5 - 12.5 fL   Neutro Abs 3,918 1,500 - 7,800 cells/uL   Absolute Lymphocytes 1,544 850 - 3,900 cells/uL   Absolute Monocytes 620 200 - 950 cells/uL   Eosinophils Absolute 81 15 - 500 cells/uL   Basophils Absolute 37 0 - 200 cells/uL   Neutrophils Relative % 63.2 %   Total Lymphocyte 24.9 %   Monocytes Relative 10.0 %   Eosinophils Relative 1.3 %   Basophils Relative 0.6 %  Comprehensive metabolic panel with GFR     Status: Abnormal   Collection Time: 01/05/24 10:29 AM  Result Value Ref Range   Glucose, Bld 113 (H) 65 - 99 mg/dL    Comment: .            Fasting reference interval . For someone without known diabetes, a glucose value between 100 and 125 mg/dL is consistent with prediabetes and should be confirmed with a follow-up test. .    BUN 12 7 - 25 mg/dL   Creat 9.04 9.29 - 8.71 mg/dL   eGFR 86 > OR = 60 fO/fpw/8.26f7   BUN/Creatinine Ratio SEE NOTE: 6 - 22 (calc)    Comment:    Not Reported: BUN and Creatinine are within    reference range. .    Sodium 140 135 - 146 mmol/L   Potassium 4.2 3.5 - 5.3 mmol/L   Chloride 103 98 - 110 mmol/L   CO2 31 20 - 32 mmol/L   Calcium  9.7 8.6 - 10.3 mg/dL   Total Protein 7.4 6.1 - 8.1 g/dL   Albumin 4.5 3.6 - 5.1 g/dL   Globulin 2.9 1.9 - 3.7 g/dL (calc)   AG Ratio 1.6 1.0 - 2.5 (calc)   Total Bilirubin 0.7 0.2 - 1.2 mg/dL   Alkaline phosphatase (APISO) 60 35 - 144 U/L   AST 26 10 - 35 U/L   ALT 44 9 - 46 U/L     PHQ2/9:    02/02/2024    9:53 AM 01/05/2024    9:38 AM 09/01/2023    10:27 AM 04/21/2023   10:01 AM 02/21/2023    1:19 PM  Depression screen PHQ 2/9  Decreased Interest 0 0 0 0 0  Down, Depressed, Hopeless 0 0 0 0 0  PHQ - 2 Score 0 0 0 0 0  Altered sleeping 0 0 0 1   Tired, decreased energy 0 0 0 3   Change in appetite 0 0 0 0   Feeling bad or failure about yourself  0 0 0 0   Trouble concentrating 0 0 0 0   Moving slowly or fidgety/restless 0 0 0 0   Suicidal thoughts 0 0 0 0   PHQ-9 Score 0 0 0 4   Difficult doing work/chores Not difficult at all Not difficult at all Not difficult at all      phq 9 is negative  Fall Risk:    02/02/2024    9:53 AM 01/05/2024    9:33 AM 09/01/2023   10:28 AM 04/21/2023  10:01 AM 02/21/2023    1:08 PM  Fall Risk   Falls in the past year? 0 0 1 0 1  Number falls in past yr: 0 0 0 0 0  Injury with Fall? 0 0 0 0 1  Risk for fall due to : No Fall Risks No Fall Risks Impaired balance/gait No Fall Risks No Fall Risks  Follow up Falls evaluation completed Falls evaluation completed Education provided;Falls evaluation completed;Falls prevention discussed Falls prevention discussed Education provided;Falls prevention discussed      Assessment & Plan Status post drainage of occipital abscess Occipital abscess healing well, slight swelling and itching due to skin stretching and hair regrowth, no infection or pain, slight scarring expected.  Asthma Asthma symptoms include dyspnea and fatigue, particularly post-exertion. Combivent, Singulair , and Dupixent  prescribed. Alcohol may contribute to fatigue. Lung sounds clear, no wheezing, fever, or chills. Pulmonologist follow-up every six months. - Continue Combivent inhaler as needed, up to three times daily. - Continue Singulair  as prescribed. - Contact pulmonologist if symptoms persist or worsen.

## 2024-02-15 ENCOUNTER — Other Ambulatory Visit: Payer: Self-pay | Admitting: Family Medicine

## 2024-02-15 DIAGNOSIS — F5101 Primary insomnia: Secondary | ICD-10-CM

## 2024-02-25 ENCOUNTER — Ambulatory Visit

## 2024-03-17 ENCOUNTER — Other Ambulatory Visit: Payer: Self-pay | Admitting: Family Medicine

## 2024-03-17 DIAGNOSIS — J302 Other seasonal allergic rhinitis: Secondary | ICD-10-CM

## 2024-04-06 ENCOUNTER — Ambulatory Visit: Admitting: Urology

## 2024-04-08 ENCOUNTER — Ambulatory Visit: Payer: Self-pay | Admitting: Urology

## 2024-04-13 ENCOUNTER — Encounter: Payer: Self-pay | Admitting: Urology

## 2024-04-19 ENCOUNTER — Ambulatory Visit: Admitting: Dermatology

## 2024-05-01 ENCOUNTER — Other Ambulatory Visit: Payer: Self-pay | Admitting: Family Medicine

## 2024-05-01 DIAGNOSIS — F325 Major depressive disorder, single episode, in full remission: Secondary | ICD-10-CM

## 2024-05-04 NOTE — Telephone Encounter (Signed)
 Requested Prescriptions  Pending Prescriptions Disp Refills   DULoxetine  (CYMBALTA ) 60 MG capsule [Pharmacy Med Name: DULOXETINE  HCL DR 60 MG CAP] 90 capsule 0    Sig: TAKE 1 CAPSULE BY MOUTH EVERY DAY     Psychiatry: Antidepressants - SNRI - duloxetine  Passed - 05/04/2024 11:11 AM      Passed - Cr in normal range and within 360 days    Creat  Date Value Ref Range Status  01/05/2024 0.95 0.70 - 1.28 mg/dL Final   Creatinine, Urine  Date Value Ref Range Status  01/05/2024 166 20 - 320 mg/dL Final         Passed - eGFR is 30 or above and within 360 days    GFR, Est African American  Date Value Ref Range Status  03/10/2020 103 > OR = 60 mL/min/1.48m2 Final   GFR, Est Non African American  Date Value Ref Range Status  03/10/2020 89 > OR = 60 mL/min/1.58m2 Final   GFR, Estimated  Date Value Ref Range Status  11/30/2022 >60 >60 mL/min Final    Comment:    (NOTE) Calculated using the CKD-EPI Creatinine Equation (2021)    eGFR  Date Value Ref Range Status  01/05/2024 86 > OR = 60 mL/min/1.30m2 Final  04/18/2022 82 >59 mL/min/1.73 Final         Passed - Completed PHQ-2 or PHQ-9 in the last 360 days      Passed - Last BP in normal range    BP Readings from Last 1 Encounters:  02/02/24 128/82         Passed - Valid encounter within last 6 months    Recent Outpatient Visits           3 months ago Cutaneous abscess of head excluding face   [facility] Glenard Mire, MD   4 months ago Dyslipidemia associated with type 2 diabetes mellitus [facility])   [facility] Glenard Mire, MD   8 months ago Dyslipidemia associated with type 2 diabetes mellitus [facility])   [facility] Glenard Mire, MD       Future Appointments             In 6 days Sowles, Krichna, MD [facility] [place]

## 2024-05-10 ENCOUNTER — Ambulatory Visit: Admitting: Family Medicine

## 2024-05-10 ENCOUNTER — Encounter: Payer: Self-pay | Admitting: Family Medicine

## 2024-05-10 VITALS — BP 124/80 | HR 93 | Temp 98.6°F | Resp 18 | Ht 65.0 in | Wt 161.6 lb

## 2024-05-10 DIAGNOSIS — J302 Other seasonal allergic rhinitis: Secondary | ICD-10-CM

## 2024-05-10 DIAGNOSIS — G40209 Localization-related (focal) (partial) symptomatic epilepsy and epileptic syndromes with complex partial seizures, not intractable, without status epilepticus: Secondary | ICD-10-CM

## 2024-05-10 DIAGNOSIS — E1159 Type 2 diabetes mellitus with other circulatory complications: Secondary | ICD-10-CM | POA: Diagnosis not present

## 2024-05-10 DIAGNOSIS — I2510 Atherosclerotic heart disease of native coronary artery without angina pectoris: Secondary | ICD-10-CM

## 2024-05-10 DIAGNOSIS — J3089 Other allergic rhinitis: Secondary | ICD-10-CM

## 2024-05-10 DIAGNOSIS — R350 Frequency of micturition: Secondary | ICD-10-CM

## 2024-05-10 DIAGNOSIS — R413 Other amnesia: Secondary | ICD-10-CM | POA: Diagnosis not present

## 2024-05-10 DIAGNOSIS — F1020 Alcohol dependence, uncomplicated: Secondary | ICD-10-CM

## 2024-05-10 DIAGNOSIS — F5101 Primary insomnia: Secondary | ICD-10-CM

## 2024-05-10 DIAGNOSIS — I152 Hypertension secondary to endocrine disorders: Secondary | ICD-10-CM

## 2024-05-10 DIAGNOSIS — F325 Major depressive disorder, single episode, in full remission: Secondary | ICD-10-CM

## 2024-05-10 LAB — POCT GLYCOSYLATED HEMOGLOBIN (HGB A1C): Hemoglobin A1C: 6 % — AB (ref 4.0–5.6)

## 2024-05-10 LAB — POCT URINALYSIS DIPSTICK
Bilirubin, UA: NEGATIVE
Blood, UA: NEGATIVE
Glucose, UA: NEGATIVE
Ketones, UA: NEGATIVE
Leukocytes, UA: NEGATIVE
Nitrite, UA: NEGATIVE
Odor: NORMAL
Protein, UA: NEGATIVE
Spec Grav, UA: 1.02 (ref 1.010–1.025)
Urobilinogen, UA: 0.2 U/dL
pH, UA: 6.5 (ref 5.0–8.0)

## 2024-05-10 MED ORDER — ATORVASTATIN CALCIUM 80 MG PO TABS: 80.0000 mg | ORAL_TABLET | Freq: Every day | ORAL | 1 refills | Status: DC

## 2024-05-10 MED ORDER — LOSARTAN POTASSIUM 25 MG PO TABS: 25.0000 mg | ORAL_TABLET | Freq: Every day | ORAL | 1 refills | Status: DC

## 2024-05-10 MED ORDER — MONTELUKAST SODIUM 10 MG PO TABS: 10.0000 mg | ORAL_TABLET | Freq: Every day | ORAL | 1 refills | Status: DC

## 2024-05-10 MED ORDER — AZELASTINE HCL 0.1 % NA SOLN: 2.0000 | Freq: Two times a day (BID) | NASAL | 1 refills | Status: DC

## 2024-05-10 MED ORDER — FLUTICASONE PROPIONATE 50 MCG/ACT NA SUSP: 2.0000 | Freq: Every day | NASAL | 1 refills | Status: DC

## 2024-05-10 MED ORDER — DULOXETINE HCL 60 MG PO CPEP: 60.0000 mg | ORAL_CAPSULE | Freq: Every day | ORAL | 1 refills | Status: DC

## 2024-05-10 MED ORDER — TRAZODONE HCL 100 MG PO TABS: 100.0000 mg | ORAL_TABLET | Freq: Every day | ORAL | 1 refills | Status: DC

## 2024-05-10 NOTE — Progress Notes (Signed)
 Name: David Skinner   MRN: [id number]    DOB: April 06, 1951   Date:05/10/2024       Progress Note  Subjective  Chief Complaint  Chief Complaint  Patient presents with   Medical Management of Chronic Issues   Urinary Frequency   [place] interpreter   Discussed the use of AI scribe software for clinical note transcription with the patient, who gave verbal consent to proceed.  History of Present Illness David Skinner is a 73 year old male with neurocognitive disorder and epilepsy who presents for medication management and follow-up.  He has missed several medications, including Myrbetriq  for bladder control. He has not seen a urologist recently.  He is also missing Keppra , an anti-seizure medication, and two memory medications, donepezil and Namenda. He has not had a follow-up with his neurologist since May 2024, resulting in a lapse in medication refills. He experiences memory loss, previously attributed to either neurocognitive disease or long-standing alcoholism.  He has a history of atopic neurodermatitis and receives Dupixent  injections every two weeks. However, he has missed recent appointments and has not received the injections for a few months.  He has coronary artery disease and carotid atherosclerosis but reports no recent chest pain. He has type 2 diabetes managed through lifestyle modifications, as he does not take diabetic medications. His A1c has improved since 2024. He also has dyslipidemia and hypertension, for which he takes medications.  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  He takes trazodone  for sleep, which he reports is effective, and duloxetine  for depression. He also takes montelukast  for asthma and allergies.  He still consumes large amount of  alcohol intermittently, which he is aware it contributes to his memory issues.    Patient Active Problem List   Diagnosis  Date Noted   Asthma with allergic rhinitis, moderate persistent, uncomplicated 01/05/2024   BPH with elevated PSA 01/05/2024   Extrinsic asthma without complication 09/22/2023   Angina pectoris associated with type 2 diabetes mellitus (HCC) 09/01/2023   Hyperlipidemia, mixed 07/02/2021   Coronary artery disease involving native coronary artery of native heart 07/02/2021   Alcoholism (HCC) 03/10/2020   OSA (obstructive sleep apnea) 12/09/2016   RLS (restless legs syndrome) 12/09/2016   Major depression in remission 12/09/2016   Cervical radiculitis 01/09/2016   Lumbosacral radiculitis 01/09/2016   Osteoarthritis of carpometacarpal (CMC) joint of thumb 01/09/2016   Tinea cruris 03/21/2015   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) 03/09/2015   Chronic constipation 11/28/2014   Insomnia 11/28/2014   Dyslipidemia 11/28/2014   Fatty infiltration of liver 11/28/2014   Essential (primary) hypertension 11/28/2014   Gastric reflux 11/28/2014   Memory loss or impairment 11/28/2014   Perennial allergic rhinitis with seasonal variation 11/28/2014   Vitamin D  deficiency 11/28/2014   Tenosynovitis of thumb 11/28/2014   Partial epilepsy with impairment of consciousness (HCC) 11/16/2014    Past Surgical History:  Procedure Laterality Date   COLONOSCOPY WITH PROPOFOL  N/A 03/25/2017   Procedure: COLONOSCOPY WITH PROPOFOL ;  Surgeon: Unk Corinn Skiff, MD;  Location: [facility];  Service: Endoscopy;  Laterality: N/A;   COLONOSCOPY WITH PROPOFOL  N/A 01/16/2021   Procedure: COLONOSCOPY WITH PROPOFOL ;  Surgeon: Therisa Bi, MD;  Location[facility];  Service: Gastroenterology;  Laterality: N/A;  USE VIET[place]ESE VIDEO INTERPRETER   ESOPHAGOGASTRODUODENOSCOPY N/A 03/25/2017   Procedure: ESOPHAGOGASTRODUODENOSCOPY (EGD);  Surgeon: Unk Corinn Skiff, MD;  Location: [facility];  Service: Endoscopy;  Laterality: N/A;  Diabetic - oral meds   LASIK  Bilateral     Family History  Problem Relation Age of Onset   Diabetes Mother    Heart disease Mother     Social History   Tobacco Use   Smoking status: Former    Current packs/day: 0.00    Average packs/day: 1 pack/day for 15.4 years (15.4 ttl pk-yrs)    Types: Cigarettes    Start date: 06/17/1984    Quit date: 11/28/1999    Years since quitting: 24.4   Smokeless tobacco: Never  Substance Use Topics   Alcohol use: Not Currently    Alcohol/week: 10.0 standard drinks of alcohol    Types: 5 Cans of beer, 5 Shots of liquor per week     Current Outpatient Medications:    alfuzosin  (UROXATRAL ) 10 MG 24 hr tablet, Take 1 tablet (10 mg total) by mouth daily with breakfast., Disp: 30 tablet, Rfl: 11   ASPIRIN  81 PO, Take 1 tablet by mouth daily., Disp: , Rfl:    Dupilumab  (DUPIXENT ) 300 MG/2ML SOAJ, Inject 300 mg into the skin every 14 (fourteen) days., Disp: 4 mL, Rfl: 5   Ipratropium-Albuterol  (COMBIVENT RESPIMAT) 20-100 MCG/ACT AERS respimat, Inhale 1 puff into the lungs every 6 (six) hours., Disp: , Rfl:    tadalafil  (CIALIS ) 5 MG tablet, Take 1 tablet (5 mg total) by mouth daily., Disp: 30 tablet, Rfl: 11   atorvastatin  (LIPITOR) 80 MG tablet, Take 1 tablet (80 mg total) by mouth daily., Disp: 90 tablet, Rfl: 1   azelastine  (ASTELIN ) 0.1 % nasal spray, Place 2 sprays into both nostrils 2 (two) times daily. Use in each nostril as directed, Disp: 60 mL, Rfl: 1   donepezil (ARICEPT) 10 MG tablet, Take 10 mg by mouth at bedtime. (Patient not taking: Reported on 05/10/2024), Disp: , Rfl:    DULoxetine  (CYMBALTA ) 60 MG capsule, Take 1 capsule (60 mg total) by mouth daily., Disp: 90 capsule, Rfl: 1   fluticasone  (FLONASE ) 50 MCG/ACT nasal spray, Place 2 sprays into both nostrils daily., Disp: 48 mL, Rfl: 1   levETIRAcetam  (KEPPRA ) 250 MG tablet, Take 250 mg by mouth 1 day or 1 dose. (Patient not taking: Reported on 05/10/2024), Disp: , Rfl:    losartan  (COZAAR ) 25 MG tablet, Take 1 tablet  (25 mg total) by mouth daily., Disp: 90 tablet, Rfl: 1   memantine (NAMENDA) 5 MG tablet, Take 5 mg by mouth 2 (two) times daily. (Patient not taking: Reported on 05/10/2024), Disp: , Rfl:    mirabegron  ER (MYRBETRIQ ) 25 MG TB24 tablet, Take 1 tablet (25 mg total) by mouth daily. (Patient not taking: Reported on 05/10/2024), Disp: 30 tablet, Rfl: 3   mometasone  (ELOCON ) 0.1 % cream, Apply to aa's face and scalp BID PRN up to 5 days per week., Disp: 45 g, Rfl: 3   montelukast  (SINGULAIR ) 10 MG tablet, Take 1 tablet (10 mg total) by mouth at bedtime., Disp: 90 tablet, Rfl: 1   traZODone  (DESYREL ) 100 MG tablet, Take 1 tablet (100 mg total) by mouth at bedtime., Disp: 90 tablet, Rfl: 1  Current Facility-Administered Medications:    Dupilumab  SOAJ 300 mg, 300 mg, Subcutaneous, Q14 Days, Hester Alm BROCKS, MD, 300 mg at 01/15/24 1120  Allergies  Allergen Reactions   Ibuprofen Itching   Levofloxacin    Lisinopril Cough   Metoprolol     Nsaids Itching   Tolmetin Itching   Tramadol Hcl Itching   Tylenol  [Acetaminophen ] Itching  I personally reviewed active problem list, medication list, allergies, family history with the patient/caregiver today.   ROS  Ten systems reviewed and is negative except as mentioned in HPI    Objective Physical Exam CONSTITUTIONAL: Patient appears well-developed and well-nourished. No distress. HEENT: Head atraumatic, normocephalic, neck supple. Ears normal. CARDIOVASCULAR: Normal rate, regular rhythm and normal heart sounds. No murmur heard. No BLE edema. No cuts or lesions on feet. PULMONARY: Effort normal and breath sounds normal. No respiratory distress. Lungs clear to auscultation. ABDOMINAL: There is no tenderness or distention. MUSCULOSKELETAL: Normal gait. Without gross motor or sensory deficit. PSYCHIATRIC: Patient has a normal mood and affect. Behavior is normal. Judgment and thought content normal.  Vitals:   05/10/24 0931  BP: 124/80  Pulse: 93   Resp: 18  Temp: 98.6 F (37 C)  SpO2: 93%  Weight: 161 lb 9.6 oz (73.3 kg)  Height: 5' 5 (1.651 m)    Body mass index is 26.89 kg/m.  Recent Results (from the past 2160 hours)  POCT glycosylated hemoglobin (Hb A1C)     Status: Abnormal   Collection Time: 05/10/24  9:41 AM  Result Value Ref Range   Hemoglobin A1C 6.0 (A) 4.0 - 5.6 %   HbA1c POC (<> result, manual entry)     HbA1c, POC (prediabetic range)     HbA1c, POC (controlled diabetic range)    POCT Urinalysis Dipstick     Status: None   Collection Time: 05/10/24  9:41 AM  Result Value Ref Range   Color, UA yellow    Clarity, UA clear    Glucose, UA Negative Negative   Bilirubin, UA neg    Ketones, UA neg    Spec Grav, UA 1.020 1.010 - 1.025   Blood, UA neg    pH, UA 6.5 5.0 - 8.0   Protein, UA Negative Negative   Urobilinogen, UA 0.2 0.2 or 1.0 E.U./dL   Nitrite, UA neg    Leukocytes, UA Negative Negative   Appearance clear    Odor normal     Diabetic Foot Exam:  Diabetic foot exam was performed with the following findings:   No deformities, ulcerations, or other skin breakdown Normal sensation of 10g monofilament Intact posterior tibialis and dorsalis pedis pulses      PHQ2/9:    05/10/2024    9:33 AM 02/02/2024    9:53 AM 01/05/2024    9:38 AM 09/01/2023   10:27 AM 04/21/2023   10:01 AM  Depression screen PHQ 2/9  Decreased Interest 0 0 0 0 0  Down, Depressed, Hopeless 0 0 0 0 0  PHQ - 2 Score 0 0 0 0 0  Altered sleeping 0 0 0 0 1  Tired, decreased energy 0 0 0 0 3  Change in appetite 0 0 0 0 0  Feeling bad or failure about yourself  0 0 0 0 0  Trouble concentrating 0 0 0 0 0  Moving slowly or fidgety/restless 0 0 0 0 0  Suicidal thoughts 0 0 0 0 0  PHQ-9 Score 0 0  0  0  4   Difficult doing work/chores Not difficult at all Not difficult at all Not difficult at all Not difficult at all      Data saved with a previous flowsheet row definition    phq 9 is negative  Fall Risk:     05/10/2024    9:33 AM 02/02/2024    9:53 AM 01/05/2024    9:33 AM 09/01/2023  10:28 AM 04/21/2023   10:01 AM  Fall Risk   Falls in the past year? 0 0 0 1 0  Number falls in past yr: 0 0 0 0 0  Injury with Fall? 0 0 0 0 0  Risk for fall due to :  No Fall Risks No Fall Risks Impaired balance/gait No Fall Risks  Follow up Falls evaluation completed Falls evaluation completed Falls evaluation completed Education provided;Falls evaluation completed;Falls prevention discussed Falls prevention discussed      Assessment & Plan Type 2 diabetes mellitus with circulatory complications, dyslipidemia and HTN Type 2 diabetes with improved A1c. Circulatory complications present. - Continue lifestyle modifications. - Checked feet for cuts or sores.  Atherosclerotic heart disease of native coronary artery without angina Atherosclerotic heart disease without recent angina. -under the care of cardiologist   Carotid atherosclerosis On aspirin  and statin therapy   Hypertension and dyslipidemia Managed with lifestyle modifications and medications. - Continue current medications for hypertension and dyslipidemia.  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 Memory loss noted. Discussed medication adherence to prevent progression. - Placed referral to neurologist for further evaluation and management. - Encouraged reduction of alcohol intake, especially during holidays.  Localization-related epilepsy with complex partial seizures - needs to follow up with neurologist   Primary insomnia Managed with trazodone , effective. - Continue trazodone  for sleep.  Major depressive disorder in remission Managed with duloxetine . - Prescribed duloxetine  for depression.  Allergic rhinitis - stable on singulair   Atopic neurodermatitis Managed with Dupixent  injections. Missed appointments noted. - Provided contact information for  dermatologist to schedule Dupixent  injections.  Overactive bladder with urinary frequency/BPH Increased urinary frequency, possibly due to missed Myrbetriq . - Provided contact information for urologist to schedule follow-up and medication management.  General Health Maintenance Flu shot received. - Continue routine health maintenance and vaccinations as needed.

## 2024-05-10 NOTE — Patient Instructions (Addendum)
 Team Member Role and Visual Merchandiser Info Address Start End Comments  Maree Jannett POUR, MD Consulting Physician (Neurology) Phone: [phone] Fax: [phone] [address] [facility] [address] [facility] [address] [facility] [place] [address] 01/05/2024 - -   Team Member Role and Specialty Contact Info Address Start End Comments  Hester Alm BROCKS, MD (Dermatology) Phone: [phone] Fax: [phone] [address] [place] [address] 05/10/2024 - -   Team Member Role and Specialty Contact Info Address Start End Comments  Twylla Glendia BROCKS, MD (Urology) Phone: [phone] Fax: [phone] Email: [email or web address] [address] [place] [address] 05/04/2018 - -

## 2024-05-24 ENCOUNTER — Ambulatory Visit

## 2024-05-24 DIAGNOSIS — D489 Neoplasm of uncertain behavior, unspecified: Secondary | ICD-10-CM

## 2024-05-24 DIAGNOSIS — L281 Prurigo nodularis: Secondary | ICD-10-CM

## 2024-05-24 NOTE — Progress Notes (Signed)
    Subjective   David Skinner is a 73 y.o. male who presents for the following: Lesion(s) of concern . Patient is established patient   In person interpreter used   Today patient reports: Patient states areas on sides of face itchy has been occurring for a while, does not use any creams.   Has hx of atopic neurodermatitis with prurigo nodularis   Frequently scratches at area, denies any trauma   Review of Systems:    No other skin or systemic complaints except as noted in HPI or Assessment and Plan.  The following portions of the chart were reviewed this encounter and updated as appropriate: medications, allergies, medical history  Relevant Medical History:  n/a   Objective  (SKPE) Well appearing patient in no apparent distress; mood and affect are within normal limits. Examination was performed of the: Focused Exam of: face   Examination notable for: Angioma(s): Scattered red vascular papule(s)  , Lentigo/lentigines: Scattered pigmented macules that are tan to brown in color and are somewhat non-uniform in shape and concentrated in the sun-exposed areas, Seborrheic Keratosis(es): Stuck-on appearing keratotic papule(s) on the trunk, some  irritated with redness, crusting, edema, and/or partial avulsion  Examination limited by: Undergarments, Shoes or socks , and Clothing   L temple extending to L cheek 5 cm linear pink plaque   Assessment & Plan  (SKAP)   LOC L cheek/temple - favor LSC/prurigo - pt opts to biopsy   Atopic neurodermatitis/ LSC/Prurigno  Prior tx - Mometasone  0.1% cream, Mometasone  0.1% solution, Singulair  10 mg, ketoconazole  shampoo, Doxycycline  50 mg po QD, and Clobetasol  0.05% solution.  - Continue dupixent   - Pending bx of above could consider ilk   Procedures, orders, diagnosis for this visit:  NEOPLASM OF UNCERTAIN BEHAVIOR L temple extending to L cheek Skin / nail biopsy Type of biopsy: tangential   Informed consent: discussed and consent  obtained   Timeout: patient name, date of birth, surgical site, and procedure verified   Procedure prep:  Patient was prepped and draped in usual sterile fashion Prep type:  Isopropyl alcohol Anesthesia: the lesion was anesthetized in a standard fashion   Anesthetic:  1% lidocaine  w/ epinephrine  1-100,000 buffered w/ 8.4% NaHCO3 Instrument used: DermaBlade   Hemostasis achieved with: pressure and aluminum chloride   Outcome: patient tolerated procedure well   Post-procedure details: sterile dressing applied and wound care instructions given   Dressing type: bandage and petrolatum    Specimen 1 - Surgical pathology Differential Diagnosis: Prurigo/LSV vs hypertrophic scar vs keloid vs epidermal nevus vs SK vs other  Check Margins: No 5 cm linear pink plaque PRURIGO NODULARIS   Related Medications Dupilumab  SOAJ 300 mg   Neoplasm of uncertain behavior -     Skin / nail biopsy -     Surgical pathology; Standing  Prurigo nodularis    Return to clinic: Return in about 2 weeks (around 06/07/2024) for Pending biopsy results, w/ Dr. Raymund.  I, Almetta Nora, RMA, am acting as scribe for Lauraine JAYSON Raymund, MD .   Documentation: I have reviewed the above documentation for accuracy and completeness, and I agree with the above.  Lauraine JAYSON Raymund, MD

## 2024-05-24 NOTE — Patient Instructions (Signed)

## 2024-05-26 LAB — SURGICAL PATHOLOGY

## 2024-05-27 ENCOUNTER — Ambulatory Visit: Payer: Self-pay

## 2024-05-27 NOTE — Progress Notes (Signed)
 [facility] using Language [employer] interpretor.

## 2024-06-04 LAB — OPHTHALMOLOGY REPORT-SCANNED

## 2024-06-07 MED ORDER — TACROLIMUS 0.1 % EX OINT: TOPICAL_OINTMENT | CUTANEOUS | 0 refills | Status: AC

## 2024-06-07 NOTE — Telephone Encounter (Signed)
 Left message to return my call.

## 2024-06-07 NOTE — Telephone Encounter (Signed)
-----   Message from Lauraine Kanaris, MD sent at 05/27/2024 10:42 AM EST -----       1. Skin, left temple extending to left cheek :       LICHEN SIMPLEX CHRONICUS  Please notify patient with below plan: Consistent with LSC/chronic rubbing Recommend to minimize picking/rubbing area Start tacro 0.1 bid to face

## 2024-06-14 ENCOUNTER — Ambulatory Visit

## 2024-06-14 DIAGNOSIS — L209 Atopic dermatitis, unspecified: Secondary | ICD-10-CM | POA: Diagnosis not present

## 2024-06-14 DIAGNOSIS — L281 Prurigo nodularis: Secondary | ICD-10-CM | POA: Diagnosis not present

## 2024-06-14 DIAGNOSIS — L2081 Atopic neurodermatitis: Secondary | ICD-10-CM

## 2024-06-14 DIAGNOSIS — L280 Lichen simplex chronicus: Secondary | ICD-10-CM

## 2024-06-14 MED ORDER — CLOBETASOL PROPIONATE 0.05 % EX OINT: TOPICAL_OINTMENT | CUTANEOUS | 5 refills | Status: AC

## 2024-06-14 MED ORDER — TACROLIMUS 0.1 % EX OINT: TOPICAL_OINTMENT | CUTANEOUS | 5 refills | Status: AC

## 2024-06-14 NOTE — Patient Instructions (Addendum)
 Start Tacrolimus  0.1% ointment for affected areas on the face Start Clobetasol  0.05% ointment for affected areas of the body   Due to recent changes in healthcare laws, you may see results of your pathology and/or laboratory studies on MyChart before the doctors have had a chance to review them. We understand that in some cases there may be results that are confusing or concerning to you. Please understand that not all results are received at the same time and often the doctors may need to interpret multiple results in order to provide you with the best plan of care or course of treatment. Therefore, we ask that you please give us  2 business days to thoroughly review all your results before contacting the office for clarification. Should we see a critical lab result, you will be contacted sooner.   If You Need Anything After Your Visit  If you have any questions or concerns for your doctor, please call our main line at [phone] and press option 4 to reach your doctor's medical assistant. If no one answers, please leave a voicemail as directed and we will return your call as soon as possible. Messages left after 4 pm will be answered the following business day.   You may also send us  a message via MyChart. We typically respond to MyChart messages within 1-2 business days.  For prescription refills, please ask your pharmacy to contact our office. Our fax number is [phone].  If you have an urgent issue when the clinic is closed that cannot wait until the next business day, you can page your doctor at the number below.    Please note that while we do our best to be available for urgent issues outside of office hours, we are not available 24/7.   If you have an urgent issue and are unable to reach us , you may choose to seek medical care at your doctor's office, retail clinic, urgent care center, or emergency room.  If you have a medical emergency, please immediately call 911 or go to the  emergency department.  Pager Numbers  - Dr. Hester: [phone]  - Dr. Jackquline: [phone]  - Dr. Claudene: [phone]   - Dr. Raymund: [phone]  In the event of inclement weather, please call our main line at [phone] for an update on the status of any delays or closures.  Dermatology Medication Tips: Please keep the boxes that topical medications come in in order to help keep track of the instructions about where and how to use these. Pharmacies typically print the medication instructions only on the boxes and not directly on the medication tubes.   If your medication is too expensive, please contact our office at [phone] option 4 or send us  a message through MyChart.   We are unable to tell what your co-pay for medications will be in advance as this is different depending on your insurance coverage. However, we may be able to find a substitute medication at lower cost or fill out paperwork to get insurance to cover a needed medication.   If a prior authorization is required to get your medication covered by your insurance company, please allow us  1-2 business days to complete this process.  Drug prices often vary depending on where the prescription is filled and some pharmacies may offer cheaper prices.  The website www.goodrx.com contains coupons for medications through different pharmacies. The prices here do not account for what the cost may be with help from insurance (it may be cheaper with your  insurance), but the website can give you the price if you did not use any insurance.  - You can print the associated coupon and take it with your prescription to the pharmacy.  - You may also stop by our office during regular business hours and pick up a GoodRx coupon card.  - If you need your prescription sent electronically to a different pharmacy, notify our office through [facility] or by phone at [phone] option 4.     Si Usted Necesita Algo Despus de  Su Visita  Tambin puede enviarnos un mensaje a travs de Clinical Cytogeneticist. Por lo general respondemos a los mensajes de MyChart en el transcurso de 1 a 2 das hbiles.  Para renovar recetas, por favor pida a su farmacia que se ponga en contacto con nuestra oficina. Randi lakes de fax es [place] [phone].  Si tiene un asunto urgente cuando la clnica est cerrada y que no puede esperar hasta el siguiente da hbil, puede llamar/localizar a su doctor(a) al nmero que aparece a continuacin.   Por favor, tenga en cuenta que aunque hacemos todo lo posible para estar disponibles para asuntos urgentes fuera del horario de [place], no estamos disponibles las 24 horas del da, los 7 [address]  [phone] de la [place].   Si tiene un problema urgente y no puede comunicarse con nosotros, puede optar por buscar atencin mdica  en el consultorio de su doctor(a), en una clnica privada, en un centro de atencin urgente o en una sala de emergencias.  Si tiene engineer, drilling, por favor llame inmediatamente al 911 o vaya a la sala de emergencias.  Nmeros de bper  - Dr. Hester: [phone]  - Dra. Jackquline: [phone]  - Dr. Claudene: [phone]  - Dra. Kitts: [phone]  En caso de inclemencias del [place], por favor llame a nuestra lnea principal al [phone] para una actualizacin sobre el estado de cualquier retraso o cierre.  Consejos para la medicacin en dermatologa: Por favor, guarde las cajas en las que vienen los medicamentos de uso tpico para ayudarle a seguir las instrucciones sobre dnde y cmo usarlos. Las farmacias generalmente imprimen las instrucciones del medicamento slo en las cajas y no directamente en los tubos del [place].   Si su medicamento es muy caro, por favor, pngase en contacto con landry rieger llamando al [phone] y presione la opcin 4 o envenos un mensaje a travs de Clinical Cytogeneticist.   No podemos decirle cul ser su copago por los medicamentos por adelantado ya que  esto es diferente dependiendo de la cobertura de su seguro. Sin embargo, es posible que podamos encontrar un medicamento sustituto a audiological scientist un formulario para que el seguro cubra el medicamento que se considera necesario.   Si se requiere una autorizacin previa para que su compaa de seguros cubra su medicamento, por favor permtanos de 1 a 2 das hbiles para completar este proceso.  Los precios de los medicamentos varan con frecuencia dependiendo del environmental consultant de dnde se surte la receta y alguna farmacias pueden ofrecer precios ms baratos.  El sitio web www.goodrx.com tiene cupones para medicamentos de health and safety inspector. Los precios aqu no tienen en cuenta lo que podra costar con la ayuda del seguro (puede ser ms barato con su seguro), pero el sitio web puede darle el precio si no utiliz tourist information centre manager.  - Puede imprimir el cupn correspondiente y llevarlo con su receta a la farmacia.  - Tambin puede pasar por nuestra oficina durante el horario de atencin regular  y recoger una tarjeta de cupones de GoodRx.  - Si necesita que su receta se enve electrnicamente a una farmacia diferente, informe a nuestra oficina a travs de MyChart de [place] o por telfono llamando al [phone] y presione la opcin 4.

## 2024-06-14 NOTE — Progress Notes (Signed)
" °  °  Subjective   David Skinner is a 73 y.o. male who presents for the following: Follow up of biopsy results. Patient is established patient   Today patient reports: No change since last appointment   Review of Systems:    No other skin or systemic complaints except as noted in HPI or Assessment and Plan.  The following portions of the chart were reviewed this encounter and updated as appropriate: medications, allergies, medical history  Relevant Medical History:  n/a   Objective  (SKPE) Well appearing patient in no apparent distress; mood and affect are within normal limits. Examination was performed of the: Focused Exam of: face   Examination notable for: Well healed biopsy site of the left temple  L temple with      Assessment & Plan  (SKAP)   Atopic dermatitis with prurigo nodularis, Lichen simplex chronicus Chronic and persistent condition with duration or expected duration over one year. Condition is symptomatic/ bothersome to patient. Not currently at goal. - explained the etiology and relationship to itch-scratch cycle - recommended that the patient cut their finger nails and refrain from scratching - General gentle skin care was discussed including: at least twice daily use of a greasy emollient (recommended cerave cream, vanicream, vaseline, aquaphor ointment), taking warm showers once daily lasting 10 minutes or less, using a mild soap (recommended Dove), immediate application of emollient after exiting bath/shower and avoiding skin care products that contain fragrances Start tacrolimus  ointment 0.1%% twice daily on affected areas of the face Educated about black box warning (and also that recent studies show no increased malignancy risk) and common adverse effects such as burning with application (advised to cool in fridge and only apply to bone-dry skin). Given location of eruption, unsafe to use daily topical CS to area. Patient requires topical calcineurin  inhibitor given high risk of dyspigmentation and atrophy from topical CS use in sensitive area. - Start clobetasol  ointment 0.05% twice daily to affected areas of the body Discussed side effect of super potent topical steroids including atrophy, dyspigmentation, striae, telangectasia, folliculitis, loss of skin pigment, hair growth, tachyphylaxis, risk of systemic absorption with missuse. - Continue dupilumab  300mg  every other week  - Side effects reviewed at length: injection site reactions, nasopharyngitis/conjunctivitis/URI, headache, exacerbation of atopic dermatitis, parasitic infection (be careful when traveling or camping) - discussed could consider alternate biologic if feels not controlled on above  - Discussed needs to have container for sharps and proper disposal     Was sun protection counseling provided?: Yes   Level of service outlined above   Patient instructions (SKPI)   Procedures, orders, diagnosis for this visit:    There are no diagnoses linked to this encounter.  Return to clinic: Return in about 6 months (around 12/13/2024) for LCS.  I, Emerick Ege, CMA am acting as scribe for Lauraine JAYSON Kanaris, MD.  Documentation: I have reviewed the above documentation for accuracy and completeness, and I agree with the above.  Lauraine JAYSON Kanaris, MD  "

## 2024-06-25 NOTE — Progress Notes (Signed)
 " Subjective:   History of Present Illness This is a 74 year old male with a history of diabetes, benign prostatic hyperplasia (BPH), and alcohol use disorder presenting with urinary frequency, abdominal pain, and testicular pain. Patient is accompanied by family member who was initially translating on his behalf. It was determined that this would be grossly inadequate given the need for clarification on multiple portions of the patient's medical history as well as both the patient and his family member being a poor historian. Vietnamese translator was obtained via [employer] services (ID [phone]).  The patient reports increased urinary frequency over the past 2 days, with each episode resulting in minimal urine output. He also experiences constant abdominal pain, which he attributes to his frequent urination. He reports bilateral testicular pain without perceiving any enlargement of the right testicle. He initially believed that these symptoms were related to diabetes but after clarification with the interpreter the patient and his family member report that he does not have DM. He reports difficulty with bowel movements, often producing only small amounts of stool and describes this as constipation. He reports that he has not undergone any abdominal surgeries.   The patient continues to consume alcohol and his family member believes his current symptoms may be related to recent alcohol intake. He has been drinking on Saturday, Sunday, and Tuesday, with his symptoms worsening after his last drink on Tuesday - this was clarified to be the last time that he had any intake of alcohol. He reports no current hematochezia. He has not been diagnosed with a metabolic disorder related to alcohol use but his family member suspects this may be the case due to his history of alcohol consumption. He has noticed recent memory issues and increased confusion from a baseline of confusion. His last alcoholic beverage was  consumed on Tuesday. He has a history of seizures but is unsure if this is strictly related to alcohol withdrawal and has been advised by his neurologist to abstain from alcohol. He is currently on Keppra  for seizure management, which he resumed approximately 3 weeks ago after a period of non-compliance that was described as being off that medication for several months.   SOCIAL HISTORY The patient admits to drinking alcohol recently, including on Monday, Tuesday, Saturday, and Sunday.      History reviewed. No pertinent past medical history.   Current Outpatient Medications  Medication Instructions   alfuzosin  HCl (UROXATRAL ) 10 mg, Oral, Daily   aspirin  81 mg, Oral, Daily   atorvastatin  (LIPITOR) 80 mg, Daily   AZELASTINE  137 mcg/spray nasal spray 2 sprays, 2 times a day   clobetasol  (TEMOVATE ) 0.05 % ointment Apply 1 gram topically to affected area of skin twice daily. Stop once resolved and restart as needed for flares. Avoid use on face, armpits, groin unless otherwise indicated.   donepezil HCl (ARICEPT) 10 mg, Daily   DULoxetine  HCl (CYMBALTA ) 60 mg, Daily   DUPIXENT  300 mg, Every 14 days   fluticasone  propionate (FLONASE ) 50 mcg/actuation nasal spray SMARTSIG:Spray(s) Both Nares   ipratropium-albuterol  (COMBIVENT RESPIMAT) 20-100 MCG/ACT inhaler 1 puff, 4 times a day as needed   levETIRAcetam  (KEPPRA ) 250 mg, Daily   losartan  potassium (COZAAR ) 25 mg, Daily   memantine (NAMENDA) 5 mg, 2 times a day   mirabegron  (MYRBETRIQ ) 25 mg, Daily   mometasone  (ELOCON ) 0.1 % cream Apply to aa's face and scalp BID PRN up to 5 days per week.   montelukast  (SINGULAIR ) 10 mg, Daily   promethazine-dextromethorphan (PROMETHAZINE-DM) 6.25-15 MG/5ML  syrup 5 mLs, Every 6 hours as needed   tacrolimus  (PROTOPIC ) 0.1% ointment Topical   tadalafil  (CIALIS ) 5 mg, Oral, Daily   traZODone  (DESYREL ) 100 mg, At bedtime     Allergies[1]      Objective:   Vitals:   06/25/24 1024   BP: 143/90  Pulse: 86  Resp: 20  Temp: 97.7 F (36.5 C)  SpO2: 96%      Physical Exam General: 74 year old male, appearing in moderate distress. Accompanied by a family member; history obtained via certified interpreter (ID [phone]) due to significant language barrier and poor baseline history from family. Gastrointestinal: Abnormal: Focal tenderness to palpation in the Left Upper Quadrant (LUQ). Abdominal distention noted, which the patient identifies as a change from his baseline. Normal: No rebound tenderness, rigidity, or guarding. No other focal areas of tenderness. Genitourinary: Abnormal: Tenderness to palpation (TTP) of the right testicle. The right testicle is noticeably enlarged upon palpation compared to the left. Normal: Cremasteric reflex is intact bilaterally (decreases suspicion for testicular torsion). No clear varicocele or hydrocele identified. No penile discharge, rashes, or lesions. Neurological: Abnormal: Acutely worsened confusion from a baseline of chronic cognitive decline (noted by family and use of Donepezil/Memantine). Normal: Patient responds to simple commands. Gait is intact without ataxia. No tremors, diaphoresis, or hallucinations noted currently. Pupils are equal, round, and reactive (PERRL); extraocular movements intact (EOMI).      No results found.    Recent Results (from the past 24 hours)  POC Urine Dipstick   Collection Time: 06/25/24 10:43 AM  Result Value Ref Range   Color Yellow Yellow   Clarity Cloudy (A) Clear   Glucose,Urine Negative Negative mg/dL   Bilirubin 3+ (A) Negative   Ketones,Urine Negative Negative mg/dL   Specific Gravity 8.984 1.005, 1.010, 1.015, 1.020, 1.025, 1.030   Blood +/- (A) Negative   pH 5.0    Protein 3+ (A) Negative mg/dL   Urobilinogen 0.2 0.2, 1.0 EU/dL   Nitrite Positive (A) Negative   Leukocyte Esterase 2+ (A) Negative     Assessment:   1. Left upper quadrant abdominal pain   2. Urinary frequency    3. Pain in right testicle        Plan:   Patient's Medications  New Prescriptions   No medications on file     Assessment & Plan Medical Decision Making (MDM) 74 year old male with a complex history--including Alcohol Use Disorder, Chronic Cognitive Impairment, and BPH--presents with acute abdominal pain, testicular pain, and worsened confusion. Clinical Reasoning & Differential Diagnosis: Acute Pancreatitis: High clinical suspicion given the focal LUQ tenderness, abdominal distention, and heavy alcohol intake as recently as 3 days ago. Alcohol is a potent trigger for acute pancreatic inflammation. Complicated Urinary Tract Infection (UTI) / Epididymitis: The presence of nitrites, leukocytes, and cloudy urine combined with right testicular tenderness and enlargement is highly concerning for a localized infection that may be ascending (e.g., epididymo-prostatitis). In an elderly, confused male, this carries a high risk of Urosepsis. Hepatobiliary Dysfunction: The 3+ Bilirubin in the urine is highly abnormal and, in the context of LUQ pain and alcohol use, suggests alcoholic hepatitis or biliary obstruction. Alcohol Withdrawal / Delirium Tremens Risk: Last intake was Tuesday (approx. 72 hours ago). He is currently in the peak window for severe withdrawal symptoms. His acute-on-chronic confusion and prior seizure history (with recent Keppra  non-compliance) make him a high risk for withdrawal seizures or DTs. Conclusion: The patient is manifesting signs of multi-system illness (Pancreatitis vs. Biliary disease vs. Sepsis)  and is at high risk for alcohol withdrawal complications. He requires an emergent level of care for IV fluids, stat imaging (CT Abdomen/Pelvis), and comprehensive laboratory work (Lipase, LFTs, CBC) that exceeds Urgent Care capabilities.  Intervention Rationale Emergency Room Referral: Rationale: Mandatory for diagnostic clarification of LUQ pain and testicular enlargement, and  for the management of high-risk alcohol withdrawal. Ambulance Offer: Rationale: Offered due to the risk of seizure or clinical decline; however, family opted for [facility]. Patient is currently hemodynamically stable, justifying this mode of transport if the family proceeds immediately. Medication Deferral: Rationale: No antibiotics or analgesics (NSAIDs/Tylenol ) were prescribed today due to the 3+ Bilirubin, as the status of his liver and pancreas must be established before introducing potentially hepatotoxic or nephrotoxic agents.  Updated Medical Plan of Care Disposition: Immediate Referral to the Emergency Department. Instructions for Family: Proceed directly to the hospital. Do not delay for meals or personal errands. The patient must remain NPO (nothing by mouth)--no food or water--until evaluated by an ER physician, as he may require imaging or procedures. Medications: Continue Keppra  as previously directed to prevent further seizure activity. Avoid Tylenol  (Acetaminophen ) or Advil (NSAIDs) until he is cleared by the hospital doctors. Emergency Precautions: If the patient experiences a seizure, becomes unresponsive, or develops severe vomiting during the drive, pull over and call 911 immediately.   The diagnosis and corresponding management plan were discussed thoroughly with Caedmon, including a review of the available treatment options along with their potential risks and benefits. Paddy demonstrated understanding of the information provided, verbalized agreement with the plan, and all questions were addressed.    AI technology was used to create visit note. Verbal consent from the patient/caregiver was obtained prior to its use.  Fonda Servant, FNP         [1] Allergies Allergen Reactions   Acetaminophen  Itching   Ibuprofen Itching   Levofloxacin Hives and Itching   Nsaids Itching   Tolmetin Itching   Tramadol Hcl Itching and Unknown   Metoprolol   Unknown   Lisinopril Cough  "

## 2024-06-30 NOTE — Progress Notes (Signed)
 "    07/01/2024 8:25 PM   [name] [birth year] [id number]  Referring provider: Glenard Mire, MD [address] [place],  [place] [address]  Urological history: 1. BPH with LU TS - cysto (09/2022) mild lateral lobe enlargement   Chief Complaint  Patient presents with   Benign Prostatic Hypertrophy   HPI: David Skinner is a 74 y.o. man who presents today for nocturia x 10 with his wife, Camie, and interpretive services.    Previous records reviewed.  He was seen at [facility] in [place] on 01/09 for symptoms of urinary frequency, abdominal pain, and testicular pain.  Urine dip was negative and urine culture grew out MUF.  A contrast CT had non specific mild prominence of the seminal vesicles and a non specific area of hypodensity involving the left peripheral zone of the prostate gland.  He was diagnosed with seminal vesiculitis and placed on Septra DS BID x 14 days.  He is still taking the antibiotic at this time.    He and his wife feel his symptoms have been improving since he started the antibiotics.    I PSS 4/5  He reports daytime frequency and nocturia x  4.   Patient denies any modifying or aggravating factors.  Patient denies any recent UTI's, gross hematuria, dysuria or suprapubic/flank pain.  Patient denies any fevers, chills, nausea or vomiting.    UA (06/2024) bland  PVR 0 mL   Serum creatinine (06/2024) 0.91  He is also having issues with reaching organism and ejaculation.    He is not certain if he is taking alfuzosin  10 mg daily, mirabergron 25 mg daily, or tadalafil  5 mg daily.    PMH: Past Medical History:  Diagnosis Date   Allergy    Anxiety    Atopy    Cataract of right eye    [facility]   Chronic constipation    Elevated LFTs    Fatty liver    GERD (gastroesophageal reflux disease)    Hyperlipidemia    Hypertension    Hypoglycemia    Insomnia    Memory change    Overweight     Paresthesia of hand    Seizure (HCC)    12/12, 09/27/14, 02/28/16   Tenosynovitis of finger    Tinea cruris    Vitamin D  deficiency     Surgical History: Past Surgical History:  Procedure Laterality Date   COLONOSCOPY WITH PROPOFOL  N/A 03/25/2017   Procedure: COLONOSCOPY WITH PROPOFOL ;  Surgeon: Unk Corinn Skiff, MD;  Location: [facility];  Service: Endoscopy;  Laterality: N/A;   COLONOSCOPY WITH PROPOFOL  N/A 01/16/2021   Procedure: COLONOSCOPY WITH PROPOFOL ;  Surgeon: Therisa Bi, MD;  Location[facility];  Service: Gastroenterology;  Laterality: N/A;  USE VIETNAMESE VIDEO INTERPRETER   ESOPHAGOGASTRODUODENOSCOPY N/A 03/25/2017   Procedure: ESOPHAGOGASTRODUODENOSCOPY (EGD);  Surgeon: Unk Corinn Skiff, MD;  Location: [facility];  Service: Endoscopy;  Laterality: N/A;  Diabetic - oral meds   LASIK Bilateral     Home Medications:  Allergies as of 07/01/2024       Reactions   Ibuprofen Itching   Levofloxacin    Lisinopril Cough   Metoprolol     Nsaids Itching   Tolmetin Itching   Tramadol Hcl Itching   Tylenol  [acetaminophen ] Itching        Medication List        Accurate as of July 01, 2024 11:59 PM. If you have any questions,  ask your nurse or doctor.          alfuzosin  10 MG 24 hr tablet Commonly known as: UROXATRAL  Take 1 tablet (10 mg total) by mouth daily with breakfast.   ASPIRIN  81 PO Take 1 tablet by mouth daily.   atorvastatin  80 MG tablet Commonly known as: LIPITOR Take 1 tablet (80 mg total) by mouth daily.   azelastine  0.1 % nasal spray Commonly known as: ASTELIN  Place 2 sprays into both nostrils 2 (two) times daily. Use in each nostril as directed   clobetasol  ointment 0.05 % Commonly known as: TEMOVATE  Apply 1 gram topically to affected area of skin twice daily. Stop once resolved and restart as needed for flares. Avoid use on face, armpits, groin unless otherwise indicated.   Combivent Respimat 20-100 MCG/ACT Aers  respimat Generic drug: Ipratropium-Albuterol  Inhale 1 puff into the lungs every 6 (six) hours.   donepezil 10 MG tablet Commonly known as: ARICEPT Take 10 mg by mouth at bedtime.   DSS 100 MG Caps Take 100 mg by mouth.   DULoxetine  60 MG capsule Commonly known as: CYMBALTA  Take 1 capsule (60 mg total) by mouth daily.   Dupixent  300 MG/2ML Soaj Generic drug: Dupilumab  Inject 300 mg into the skin every 14 (fourteen) days.   fluticasone  50 MCG/ACT nasal spray Commonly known as: FLONASE  Place 2 sprays into both nostrils daily.   levETIRAcetam  250 MG tablet Commonly known as: KEPPRA  Take 250 mg by mouth 1 day or 1 dose.   losartan  25 MG tablet Commonly known as: COZAAR  Take 1 tablet (25 mg total) by mouth daily.   memantine 5 MG tablet Commonly known as: NAMENDA Take 5 mg by mouth 2 (two) times daily.   mirabegron  ER 50 MG Tb24 tablet Commonly known as: MYRBETRIQ  Take 1 tablet (50 mg total) by mouth daily. What changed:  medication strength how much to take   mometasone  0.1 % cream Commonly known as: ELOCON  Apply to aa's face and scalp BID PRN up to 5 days per week.   montelukast  10 MG tablet Commonly known as: SINGULAIR  Take 1 tablet (10 mg total) by mouth at bedtime.   sulfamethoxazole-trimethoprim 800-160 MG tablet Commonly known as: BACTRIM DS Take 1 tablet by mouth 2 (two) times daily.   tacrolimus  0.1 % ointment Commonly known as: PROTOPIC  Apply 1 gram to aa's BID PRN.   tacrolimus  0.1 % ointment Commonly known as: PROTOPIC  Apply 2 grams twice daily to affected areas of skin   tadalafil  5 MG tablet Commonly known as: CIALIS  Take 1 tablet (5 mg total) by mouth daily.   traZODone  100 MG tablet Commonly known as: DESYREL  Take 1 tablet (100 mg total) by mouth at bedtime.        Allergies: Allergies[1]  Family History: Family History  Problem Relation Age of Onset   Diabetes Mother    Heart disease Mother     Social History:  reports  that he quit smoking about 24 years ago. His smoking use included cigarettes. He started smoking about 40 years ago. He has a 15.4 pack-year smoking history. He has never used smokeless tobacco. He reports that he does not currently use alcohol after a past usage of about 10.0 standard drinks of alcohol per week. He reports that he does not use drugs.  ROS: Pertinent ROS in HPI  Physical Exam: BP 135/86   Pulse 65   Wt 150 lb (68 kg)   SpO2 97%   BMI 24.96 kg/m  Constitutional:  Well nourished. Alert and oriented, No acute distress. HEENT: [place] AT, moist mucus membranes.  Trachea midline Cardiovascular: No clubbing, cyanosis, or edema. Respiratory: Normal respiratory effort, no increased work of breathing. GU: No CVA tenderness.  No bladder fullness or masses.  Patient with uncircumcised phallus. Foreskin easily retracted  Urethral meatus is patent.  No penile discharge. No penile lesions or rashes. Scrotum without lesions, cysts, rashes and/or edema.  Testicles are located scrotally bilaterally. No masses are appreciated in the testicles. Left and right epididymis are normal. Rectal: Patient with  normal sphincter tone. Anus and perineum without scarring or rashes. No rectal masses are appreciated. Prostate is approximately 45 grams, firmer in the left lobe, no nodules are appreciated. Seminal vesicles could not be palpated.  Neurologic: Grossly intact, no focal deficits, moving all 4 extremities. Psychiatric: Normal mood and affect.  Laboratory Data: See Epic and HPI   I have reviewed the labs.   Pertinent Imaging:  07/01/24 16:56  Scan Result 0mL    Assessment & Plan:    1. Nocturia - he is not sure if he is taking the Myrbetriq  25 mg daily, so they will check when they get home  - I sent in a prescription for Mybetriq 50 mg daily, so he can start taking an increased dose going forward, will reassess when he returns in month  2. BPH with LU TS - DRE firm in left lobe - UA  benign  - PVR < 300 cc  - most bothersome symptoms are frequency and nocturia  - encouraged avoiding bladder irritants, fluid restriction before bedtime and timed voiding's - he is not sure if he is taking the tadalafil  5 mg daily or the alfuzosin  10 mg daily, they will check when they get home - he will be discontinue the alfuzosin  and continue the tadalafil  5 mg daily - prescription sent for tadalafil  5 mg daily  - will need to reassess once he returns in one month   3. Ejaculatory disorder - explained that the alfuzosin  has the side effect of retrograde ejaculation and if he has been taking the alfuzosin , once they get home, they will check and if he is, they will discontinue the medication  4. Seminal vesiculitis - patient still on antibiotics with symptoms improving   5.  Hypodensity left peripheral zone of prostate gland - DRE w/o nodules - patient is currently on antibiotics for seminal vesiculitis, so we will hold on obtaining a PSA until he returns in one month as infection can cause a false elevation in the PSA    Return in about 1 month (around 08/01/2024) for PSA, SHIM, I PSS, PVR .  These notes generated with voice recognition software. I apologize for typographical errors.  [name]  [facility] [address]  [address] [place], [place] [address] [phone]     [1]  Allergies Allergen Reactions   Ibuprofen Itching   Levofloxacin    Lisinopril Cough   Metoprolol     Nsaids Itching   Tolmetin Itching   Tramadol Hcl Itching   Tylenol  [Acetaminophen ] Itching   "

## 2024-07-01 ENCOUNTER — Ambulatory Visit: Admitting: Urology

## 2024-07-01 VITALS — BP 135/86 | HR 65 | Wt 150.0 lb

## 2024-07-01 DIAGNOSIS — N138 Other obstructive and reflux uropathy: Secondary | ICD-10-CM | POA: Diagnosis not present

## 2024-07-01 DIAGNOSIS — N5319 Other ejaculatory dysfunction: Secondary | ICD-10-CM | POA: Diagnosis not present

## 2024-07-01 DIAGNOSIS — N5201 Erectile dysfunction due to arterial insufficiency: Secondary | ICD-10-CM

## 2024-07-01 DIAGNOSIS — R351 Nocturia: Secondary | ICD-10-CM

## 2024-07-01 DIAGNOSIS — N401 Enlarged prostate with lower urinary tract symptoms: Secondary | ICD-10-CM

## 2024-07-01 LAB — BLADDER SCAN AMB NON-IMAGING

## 2024-07-01 MED ORDER — TADALAFIL 5 MG PO TABS: 5.0000 mg | ORAL_TABLET | Freq: Every day | ORAL | 1 refills | Status: AC

## 2024-07-01 MED ORDER — MIRABEGRON ER 50 MG PO TB24: 50.0000 mg | ORAL_TABLET | Freq: Every day | ORAL | 1 refills | Status: DC

## 2024-07-02 DIAGNOSIS — R3915 Urgency of urination: Secondary | ICD-10-CM

## 2024-07-04 ENCOUNTER — Encounter: Payer: Self-pay | Admitting: Urology

## 2024-07-05 MED ORDER — OXYBUTYNIN CHLORIDE ER 10 MG PO TB24: 10.0000 mg | ORAL_TABLET | Freq: Every day | ORAL | 0 refills | Status: AC

## 2024-07-20 ENCOUNTER — Other Ambulatory Visit: Payer: Self-pay | Admitting: Family Medicine

## 2024-07-20 DIAGNOSIS — I2510 Atherosclerotic heart disease of native coronary artery without angina pectoris: Secondary | ICD-10-CM

## 2024-07-20 DIAGNOSIS — I152 Hypertension secondary to endocrine disorders: Secondary | ICD-10-CM

## 2024-07-20 DIAGNOSIS — F325 Major depressive disorder, single episode, in full remission: Secondary | ICD-10-CM

## 2024-07-20 DIAGNOSIS — J302 Other seasonal allergic rhinitis: Secondary | ICD-10-CM

## 2024-07-20 DIAGNOSIS — F5101 Primary insomnia: Secondary | ICD-10-CM

## 2024-07-21 MED ORDER — AZELASTINE HCL 0.1 % NA SOLN: 2.0000 | Freq: Two times a day (BID) | NASAL | 1 refills | Status: AC

## 2024-07-21 MED ORDER — DULOXETINE HCL 60 MG PO CPEP: 60.0000 mg | ORAL_CAPSULE | Freq: Every day | ORAL | 1 refills | Status: AC

## 2024-07-21 MED ORDER — MONTELUKAST SODIUM 10 MG PO TABS: 10.0000 mg | ORAL_TABLET | Freq: Every day | ORAL | 1 refills | Status: AC

## 2024-07-21 MED ORDER — FLUTICASONE PROPIONATE 50 MCG/ACT NA SUSP: 2.0000 | Freq: Every day | NASAL | 1 refills | Status: AC

## 2024-07-21 MED ORDER — LOSARTAN POTASSIUM 25 MG PO TABS: 25.0000 mg | ORAL_TABLET | Freq: Every day | ORAL | 1 refills | Status: AC

## 2024-07-21 MED ORDER — ATORVASTATIN CALCIUM 80 MG PO TABS: 80.0000 mg | ORAL_TABLET | Freq: Every day | ORAL | 1 refills | Status: AC

## 2024-07-21 MED ORDER — TRAZODONE HCL 100 MG PO TABS: 100.0000 mg | ORAL_TABLET | Freq: Every day | ORAL | 1 refills | Status: AC

## 2024-07-21 NOTE — Telephone Encounter (Signed)
 Requested Prescriptions  Pending Prescriptions Disp Refills   atorvastatin  (LIPITOR) 80 MG tablet 90 tablet 1    Sig: Take 1 tablet (80 mg total) by mouth daily.     Cardiovascular:  Antilipid - Statins Failed - 07/21/2024  4:52 PM      Failed - Lipid Panel in normal range within the last 12 months    Cholesterol, Total  Date Value Ref Range Status  03/08/2015 183 100 - 199 mg/dL Final   Cholesterol  Date Value Ref Range Status  01/05/2024 179 <200 mg/dL Final   LDL Cholesterol (Calc)  Date Value Ref Range Status  01/05/2024 84 mg/dL (calc) Final    Comment:    Reference range: <100 . Desirable range <100 mg/dL for primary prevention;   <70 mg/dL for patients with CHD or diabetic patients  with > or = 2 CHD risk factors. SABRA LDL-C is now calculated using the Martin-Hopkins  calculation, which is a validated novel method providing  better accuracy than the Friedewald equation in the  estimation of LDL-C.  Gladis APPLETHWAITE et al. SANDREA. 7986;689(80): 2061-2068  (http://education.[facility].com/faq/FAQ164)    HDL  Date Value Ref Range Status  01/05/2024 70 > OR = 40 mg/dL Final  90/78/7983 51 >60 mg/dL Final    Comment:    According to ATP-III Guidelines, HDL-C >59 mg/dL is considered a negative risk factor for CHD.    Triglycerides  Date Value Ref Range Status  01/05/2024 153 (H) <150 mg/dL Final         Passed - Patient is not pregnant      Passed - Valid encounter within last 12 months    Recent Outpatient Visits           2 months ago Type 2 diabetes mellitus with vascular disease [facility])   [place] [facility] Glen[facility] Mire, MD   5 months ago Cutaneous abscess of head excluding face   [facility] Glenard Mire, MD   6 months ago Dyslipidemia associated with type 2 diabetes mellitus [facility])   [place] [facility] Sowles, Krichna, MD   10 months ago Dyslipidemia associated with type 2 diabetes  [facility])   [place] [facility] Sowles, Krichna, MD       Future Appointments             In 2 weeks McGowan, Clotilda DELENA RIGGERS [facility] [place]   In 4 months [place], Lauraine BROCKS, MD [facility]             azelastine  (ASTELIN ) 0.1 % nasal spray 60 mL 1    Sig: Place 2 sprays into both nostrils 2 (two) times daily. Use in each nostril as directed     Ear, Nose, and Throat: Nasal Preparations - Antiallergy Passed - 07/21/2024  4:52 PM      Passed - Valid encounter within last 12 months    Recent Outpatient Visits           2 months ago Type 2 diabetes mellitus with vascular disease [facility])   [place] [facility] Glenard Mire, MD   5 months ago Cutaneous abscess of head excluding face   [facility] Mire, MD   6 months ago Dyslipidemia associated with type 2 diabetes mellitus [facility])   [place] [facility] Glenard Mire, MD   10 months ago Dyslipidemia associated with type 2 diabetes mellitus (HCC)  [facility] Glenard Mire, MD       Future Appointments             In 2 weeks McGowan, Clotilda DELENA RIGGERS [facility] [place]   In 4 months [place], Lauraine BROCKS, MD [place] [facility]             DULoxetine  (CYMBALTA ) 60 MG capsule 90 capsule 1    Sig: Take 1 capsule (60 mg total) by mouth daily.     Psychiatry: Antidepressants - SNRI - duloxetine  Passed - 07/21/2024  4:52 PM      Passed - Cr in normal range and within 360 days    Creat  Date Value Ref Range Status  01/05/2024 0.95 0.70 - 1.28 mg/dL Final   Creatinine, Urine  Date Value Ref Range Status  01/05/2024 166 20 - 320 mg/dL Final         Passed - eGFR is 30 or above and within 360 days    GFR, Est African American  Date Value Ref Range Status  03/10/2020 103 > OR = 60 mL/min/1.29m2 Final   GFR, Est Non African American  Date Value  Ref Range Status  03/10/2020 89 > OR = 60 mL/min/1.53m2 Final   GFR, Estimated  Date Value Ref Range Status  11/30/2022 >60 >60 mL/min Final    Comment:    (NOTE) Calculated using the CKD-EPI Creatinine Equation (2021)    eGFR  Date Value Ref Range Status  01/05/2024 86 > OR = 60 mL/min/1.42m2 Final  04/18/2022 82 >59 mL/min/1.73 Final         Passed - Completed PHQ-2 or PHQ-9 in the last 360 days      Passed - Last BP in normal range    BP Readings from Last 1 Encounters:  07/01/24 135/86         Passed - Valid encounter within last 6 months    Recent Outpatient Visits           2 months ago Type 2 diabetes mellitus with vascular disease [facility])   [place] [facility] Glenard Mire, MD   5 months ago Cutaneous abscess of head excluding face   [facility] Glenard Mire, MD   6 months ago Dyslipidemia associated with type 2 diabetes mellitus [facility])   [place] [facility] Mire, MD   10 months ago Dyslipidemia associated with type 2 diabetes mellitus [facility])   [place] [facility] Glenard Mire, MD       Future Appointments             In 2 weeks McGowan, Clotilda DELENA RIGGERS [facility] [place]   In 4 months [place], Lauraine BROCKS, MD [place] [facility]             fluticasone  (FLONASE ) 50 MCG/ACT nasal spray 48 mL 1    Sig: Place 2 sprays into both nostrils daily.     Ear, Nose, and Throat: Nasal Preparations - Corticosteroids Passed - 07/21/2024  4:52 PM      Passed - Valid encounter within last 12 months    Recent Outpatient Visits           2 months ago Type 2 diabetes mellitus with vascular disease [place] [facility])   [place] [facility] Glenard Mire, MD   5 months ago Cutaneous abscess of head excluding face   [facility] [place],  [place], MD   6 months ago Dyslipidemia associated with type 2 diabetes mellitus  [place] [facility])   [place] [facility] [place], [place], MD   10 months ago Dyslipidemia associated with type 2 diabetes mellitus [facility])   [place] [facility] Glenard Dorette, MD       Future Appointments             In 2 weeks McGowan, Clotilda DELENA RIGGERS [facility] [place]   In 4 months [place], Lauraine BROCKS, MD [place] [facility]             losartan  (COZAAR ) 25 MG tablet 90 tablet 1    Sig: Take 1 tablet (25 mg total) by mouth daily.     Cardiovascular:  Angiotensin Receptor Blockers Failed - 07/21/2024  4:52 PM      Failed - Cr in normal range and within 180 days    Creat  Date Value Ref Range Status  01/05/2024 0.95 0.70 - 1.28 mg/dL Final   Creatinine, Urine  Date Value Ref Range Status  01/05/2024 166 20 - 320 mg/dL Final         Failed - K in normal range and within 180 days    Potassium  Date Value Ref Range Status  01/05/2024 4.2 3.5 - 5.3 mmol/L Final         Passed - Patient is not pregnant      Passed - Last BP in normal range    BP Readings from Last 1 Encounters:  07/01/24 135/86         Passed - Valid encounter within last 6 months    Recent Outpatient Visits           2 months ago Type 2 diabetes mellitus with vascular disease [facility])   [place] [facility], MD   5 months ago Cutaneous abscess of head excluding face   [facility] Glenard D[facility], MD   6 months ago Dyslipidemia associated with type 2 diabetes mellitus [facility])   [place] [facility] Glenard Dorette, MD   10 months ago Dyslipidemia associated with type 2 diabetes mellitus [facility])   [place] [facility] Glenard Doret[facility], MD       Future Appointments             In 2 weeks McGowan, Clotilda DELENA RIGGERS [facility] [place]   In 4 months [place], Lauraine BROCKS, MD [place] [facility]             montelukast  (SINGULAIR ) 10 MG tablet  90 tablet 1    Sig: Take 1 tablet (10 mg total) by mouth at bedtime.     Pulmonology:  Leukotriene Inhibitors Passed - 07/21/2024  4:52 PM      Passed - Valid encounter within last 12 months    Recent Outpatient Visits           2 months ago Type 2 diabetes mellitus with vascular disease [facility])   [place] [facility] Glenard D[facility], MD   5 months ago Cutaneous abscess of head excluding face   [facility] Glenard Dorette, MD   6 months ago Dyslipidemia associated with type 2 diabetes mellitus [facility])   [place] [facility] Sowles, Krichna, MD   10 months ago Dyslipidemia associated with type 2 diabetes mellitus [facility])   [place] Sowles, Krichna, MD  Future Appointments             In 2 weeks McGowan, Clotilda DELENA RIGGERS [facility] [place]   In 4 months [place], Lauraine BROCKS, MD [facility]             traZODone  (DESYREL ) 100 MG tablet 90 tablet 1    Sig: Take 1 tablet (100 mg total) by mouth at bedtime.     Psychiatry: Antidepressants - Serotonin Modulator Passed - 07/21/2024  4:52 PM      Passed - Completed PHQ-2 or PHQ-9 in the last 360 days      Passed - Valid encounter within last 6 months    Recent Outpatient Visits           2 months ago Type 2 diabetes mellitus with vascular disease [facility])   [place] [facility] Glenard Mire, MD   5 months ago Cutaneous abscess of head excluding face   [facility] Mire, MD   6 months ago Dyslipidemia associated with type 2 diabetes mellitus [facility])   [place] [facility] Glen[facility] Mire, MD   10 months ago Dyslipidemia associated with type 2 diabetes mellitus [facility])   [place] [facility] Sowles, Krichna, MD       Future Appointments             In 2 weeks McG[place]an, Clotilda DELENA RIGGERS [facility] [place]   In 4  months [place], Lauraine BROCKS, MD [facility]

## 2024-08-09 ENCOUNTER — Ambulatory Visit: Admitting: Urology

## 2024-09-03 ENCOUNTER — Ambulatory Visit

## 2024-09-13 ENCOUNTER — Ambulatory Visit: Admitting: Family Medicine

## 2024-09-30 ENCOUNTER — Ambulatory Visit

## 2024-12-13 ENCOUNTER — Ambulatory Visit
